# Patient Record
Sex: Female | Born: 1965 | Race: Black or African American | Hispanic: No | Marital: Married | Smoking: Former smoker
Health system: Southern US, Community
[De-identification: ages and names within clinical notes are randomized; demographics above are authoritative.]

## PROBLEM LIST (undated history)

## (undated) DIAGNOSIS — J189 Pneumonia, unspecified organism: Secondary | ICD-10-CM

## (undated) DIAGNOSIS — K5790 Diverticulosis of intestine, part unspecified, without perforation or abscess without bleeding: Secondary | ICD-10-CM

## (undated) DIAGNOSIS — F411 Generalized anxiety disorder: Secondary | ICD-10-CM

## (undated) DIAGNOSIS — K219 Gastro-esophageal reflux disease without esophagitis: Secondary | ICD-10-CM

## (undated) DIAGNOSIS — IMO0002 Reserved for concepts with insufficient information to code with codable children: Secondary | ICD-10-CM

## (undated) DIAGNOSIS — N920 Excessive and frequent menstruation with regular cycle: Secondary | ICD-10-CM

## (undated) DIAGNOSIS — I499 Cardiac arrhythmia, unspecified: Secondary | ICD-10-CM

## (undated) DIAGNOSIS — G620 Drug-induced polyneuropathy: Secondary | ICD-10-CM

## (undated) DIAGNOSIS — J40 Bronchitis, not specified as acute or chronic: Secondary | ICD-10-CM

## (undated) DIAGNOSIS — N926 Irregular menstruation, unspecified: Secondary | ICD-10-CM

## (undated) DIAGNOSIS — M329 Systemic lupus erythematosus, unspecified: Secondary | ICD-10-CM

## (undated) DIAGNOSIS — E119 Type 2 diabetes mellitus without complications: Secondary | ICD-10-CM

## (undated) DIAGNOSIS — K449 Diaphragmatic hernia without obstruction or gangrene: Secondary | ICD-10-CM

## (undated) DIAGNOSIS — Z87898 Personal history of other specified conditions: Secondary | ICD-10-CM

## (undated) DIAGNOSIS — Z972 Presence of dental prosthetic device (complete) (partial): Secondary | ICD-10-CM

## (undated) DIAGNOSIS — Z973 Presence of spectacles and contact lenses: Secondary | ICD-10-CM

## (undated) DIAGNOSIS — J449 Chronic obstructive pulmonary disease, unspecified: Secondary | ICD-10-CM

## (undated) DIAGNOSIS — J329 Chronic sinusitis, unspecified: Secondary | ICD-10-CM

## (undated) DIAGNOSIS — R87610 Atypical squamous cells of undetermined significance on cytologic smear of cervix (ASC-US): Secondary | ICD-10-CM

## (undated) DIAGNOSIS — F419 Anxiety disorder, unspecified: Secondary | ICD-10-CM

## (undated) DIAGNOSIS — N189 Chronic kidney disease, unspecified: Secondary | ICD-10-CM

## (undated) DIAGNOSIS — G709 Myoneural disorder, unspecified: Secondary | ICD-10-CM

## (undated) DIAGNOSIS — K08109 Complete loss of teeth, unspecified cause, unspecified class: Secondary | ICD-10-CM

## (undated) DIAGNOSIS — T7840XA Allergy, unspecified, initial encounter: Secondary | ICD-10-CM

## (undated) DIAGNOSIS — C801 Malignant (primary) neoplasm, unspecified: Secondary | ICD-10-CM

## (undated) DIAGNOSIS — E099 Drug or chemical induced diabetes mellitus without complications: Secondary | ICD-10-CM

## (undated) DIAGNOSIS — G629 Polyneuropathy, unspecified: Secondary | ICD-10-CM

## (undated) DIAGNOSIS — M1990 Unspecified osteoarthritis, unspecified site: Secondary | ICD-10-CM

## (undated) DIAGNOSIS — J4540 Moderate persistent asthma, uncomplicated: Secondary | ICD-10-CM

## (undated) DIAGNOSIS — T451X5A Adverse effect of antineoplastic and immunosuppressive drugs, initial encounter: Secondary | ICD-10-CM

## (undated) DIAGNOSIS — D259 Leiomyoma of uterus, unspecified: Secondary | ICD-10-CM

## (undated) DIAGNOSIS — A599 Trichomoniasis, unspecified: Secondary | ICD-10-CM

## (undated) DIAGNOSIS — I10 Essential (primary) hypertension: Secondary | ICD-10-CM

## (undated) DIAGNOSIS — D509 Iron deficiency anemia, unspecified: Secondary | ICD-10-CM

## (undated) DIAGNOSIS — K5730 Diverticulosis of large intestine without perforation or abscess without bleeding: Secondary | ICD-10-CM

## (undated) DIAGNOSIS — L4050 Arthropathic psoriasis, unspecified: Secondary | ICD-10-CM

## (undated) DIAGNOSIS — T380X5A Adverse effect of glucocorticoids and synthetic analogues, initial encounter: Secondary | ICD-10-CM

## (undated) DIAGNOSIS — M51369 Other intervertebral disc degeneration, lumbar region without mention of lumbar back pain or lower extremity pain: Secondary | ICD-10-CM

## (undated) DIAGNOSIS — F32A Depression, unspecified: Secondary | ICD-10-CM

## (undated) DIAGNOSIS — R51 Headache: Secondary | ICD-10-CM

## (undated) DIAGNOSIS — G43909 Migraine, unspecified, not intractable, without status migrainosus: Secondary | ICD-10-CM

## (undated) DIAGNOSIS — D649 Anemia, unspecified: Secondary | ICD-10-CM

## (undated) DIAGNOSIS — Z5189 Encounter for other specified aftercare: Secondary | ICD-10-CM

## (undated) DIAGNOSIS — L409 Psoriasis, unspecified: Secondary | ICD-10-CM

## (undated) DIAGNOSIS — M5136 Other intervertebral disc degeneration, lumbar region: Secondary | ICD-10-CM

## (undated) DIAGNOSIS — E039 Hypothyroidism, unspecified: Secondary | ICD-10-CM

## (undated) HISTORY — DX: Allergy, unspecified, initial encounter: T78.40XA

## (undated) HISTORY — DX: Trichomoniasis, unspecified: A59.9

## (undated) HISTORY — PX: ANKLE ARTHROSCOPY: SUR85

## (undated) HISTORY — DX: Other intervertebral disc degeneration, lumbar region without mention of lumbar back pain or lower extremity pain: M51.369

## (undated) HISTORY — PX: OTHER SURGICAL HISTORY: SHX169

## (undated) HISTORY — DX: Encounter for other specified aftercare: Z51.89

## (undated) HISTORY — DX: Atypical squamous cells of undetermined significance on cytologic smear of cervix (ASC-US): R87.610

## (undated) HISTORY — DX: Migraine, unspecified, not intractable, without status migrainosus: G43.909

## (undated) HISTORY — DX: Polyneuropathy, unspecified: G62.9

## (undated) HISTORY — DX: Leiomyoma of uterus, unspecified: D25.9

## (undated) HISTORY — DX: Irregular menstruation, unspecified: N92.6

## (undated) HISTORY — DX: Other intervertebral disc degeneration, lumbar region: M51.36

## (undated) HISTORY — PX: DILATION AND CURETTAGE OF UTERUS: SHX78

## (undated) HISTORY — DX: Chronic sinusitis, unspecified: J32.9

## (undated) HISTORY — DX: Myoneural disorder, unspecified: G70.9

## (undated) HISTORY — PX: ESOPHAGEAL DILATION: SHX303

## (undated) HISTORY — DX: Chronic obstructive pulmonary disease, unspecified: J44.9

## (undated) HISTORY — DX: Diverticulosis of intestine, part unspecified, without perforation or abscess without bleeding: K57.90

## (undated) HISTORY — DX: Presence of dental prosthetic device (complete) (partial): Z97.2

## (undated) HISTORY — DX: Diaphragmatic hernia without obstruction or gangrene: K44.9

## (undated) HISTORY — DX: Essential (primary) hypertension: I10

## (undated) HISTORY — DX: Excessive and frequent menstruation with regular cycle: N92.0

## (undated) HISTORY — DX: Bronchitis, not specified as acute or chronic: J40

## (undated) HISTORY — DX: Psoriasis, unspecified: L40.9

## (undated) HISTORY — PX: LYMPH NODE BIOPSY: SHX201

---

## 1990-06-21 DIAGNOSIS — Z5189 Encounter for other specified aftercare: Secondary | ICD-10-CM

## 1990-06-21 HISTORY — DX: Encounter for other specified aftercare: Z51.89

## 1998-02-23 ENCOUNTER — Emergency Department (HOSPITAL_COMMUNITY): Admission: EM | Admit: 1998-02-23 | Discharge: 1998-02-23 | Payer: Self-pay | Admitting: Emergency Medicine

## 1998-04-29 ENCOUNTER — Other Ambulatory Visit: Admission: RE | Admit: 1998-04-29 | Discharge: 1998-04-29 | Payer: Self-pay | Admitting: Obstetrics

## 1998-05-26 ENCOUNTER — Ambulatory Visit (HOSPITAL_COMMUNITY): Admission: RE | Admit: 1998-05-26 | Discharge: 1998-05-26 | Payer: Self-pay | Admitting: Chiropractic Medicine

## 1998-05-26 ENCOUNTER — Encounter: Payer: Self-pay | Admitting: Chiropractic Medicine

## 1998-06-21 HISTORY — PX: ORIF ANKLE FRACTURE: SUR919

## 1998-09-07 ENCOUNTER — Encounter: Payer: Self-pay | Admitting: Emergency Medicine

## 1998-09-07 ENCOUNTER — Inpatient Hospital Stay (HOSPITAL_COMMUNITY): Admission: EM | Admit: 1998-09-07 | Discharge: 1998-09-10 | Payer: Self-pay | Admitting: Emergency Medicine

## 1998-09-08 ENCOUNTER — Encounter: Payer: Self-pay | Admitting: Orthopedic Surgery

## 1999-07-28 ENCOUNTER — Encounter: Admission: RE | Admit: 1999-07-28 | Discharge: 1999-09-07 | Payer: Self-pay | Admitting: Orthopedic Surgery

## 2000-03-01 ENCOUNTER — Emergency Department (HOSPITAL_COMMUNITY): Admission: EM | Admit: 2000-03-01 | Discharge: 2000-03-01 | Payer: Self-pay | Admitting: Emergency Medicine

## 2000-06-19 ENCOUNTER — Emergency Department (HOSPITAL_COMMUNITY): Admission: EM | Admit: 2000-06-19 | Discharge: 2000-06-19 | Payer: Self-pay | Admitting: Emergency Medicine

## 2000-06-27 ENCOUNTER — Emergency Department (HOSPITAL_COMMUNITY): Admission: EM | Admit: 2000-06-27 | Discharge: 2000-06-27 | Payer: Self-pay | Admitting: Emergency Medicine

## 2000-07-07 ENCOUNTER — Emergency Department (HOSPITAL_COMMUNITY): Admission: EM | Admit: 2000-07-07 | Discharge: 2000-07-07 | Payer: Self-pay | Admitting: Emergency Medicine

## 2001-01-31 ENCOUNTER — Encounter: Admission: RE | Admit: 2001-01-31 | Discharge: 2001-01-31 | Payer: Self-pay | Admitting: Obstetrics & Gynecology

## 2001-06-06 ENCOUNTER — Encounter: Payer: Self-pay | Admitting: Emergency Medicine

## 2001-06-06 ENCOUNTER — Emergency Department (HOSPITAL_COMMUNITY): Admission: EM | Admit: 2001-06-06 | Discharge: 2001-06-06 | Payer: Self-pay | Admitting: Emergency Medicine

## 2002-07-24 ENCOUNTER — Encounter: Payer: Self-pay | Admitting: Obstetrics

## 2002-07-24 ENCOUNTER — Ambulatory Visit (HOSPITAL_COMMUNITY): Admission: RE | Admit: 2002-07-24 | Discharge: 2002-07-24 | Payer: Self-pay | Admitting: Obstetrics

## 2002-08-08 ENCOUNTER — Encounter: Admission: RE | Admit: 2002-08-08 | Discharge: 2002-08-08 | Payer: Self-pay | Admitting: Obstetrics

## 2002-08-08 ENCOUNTER — Encounter: Payer: Self-pay | Admitting: Obstetrics

## 2004-04-22 ENCOUNTER — Encounter (INDEPENDENT_AMBULATORY_CARE_PROVIDER_SITE_OTHER): Payer: Self-pay | Admitting: *Deleted

## 2004-04-22 HISTORY — PX: DILATION AND EVACUATION: SHX1459

## 2004-04-23 ENCOUNTER — Inpatient Hospital Stay (HOSPITAL_COMMUNITY): Admission: AD | Admit: 2004-04-23 | Discharge: 2004-04-25 | Payer: Self-pay | Admitting: Obstetrics & Gynecology

## 2004-06-21 DIAGNOSIS — Z923 Personal history of irradiation: Secondary | ICD-10-CM

## 2004-06-21 DIAGNOSIS — Z9221 Personal history of antineoplastic chemotherapy: Secondary | ICD-10-CM

## 2004-06-21 HISTORY — DX: Personal history of antineoplastic chemotherapy: Z92.21

## 2004-06-21 HISTORY — DX: Personal history of irradiation: Z92.3

## 2004-09-07 ENCOUNTER — Other Ambulatory Visit: Admission: RE | Admit: 2004-09-07 | Discharge: 2004-09-07 | Payer: Self-pay | Admitting: Otolaryngology

## 2004-09-19 DIAGNOSIS — Z85819 Personal history of malignant neoplasm of unspecified site of lip, oral cavity, and pharynx: Secondary | ICD-10-CM

## 2004-09-19 HISTORY — DX: Personal history of malignant neoplasm of unspecified site of lip, oral cavity, and pharynx: Z85.819

## 2004-09-30 ENCOUNTER — Encounter (INDEPENDENT_AMBULATORY_CARE_PROVIDER_SITE_OTHER): Payer: Self-pay | Admitting: Specialist

## 2004-09-30 ENCOUNTER — Ambulatory Visit (HOSPITAL_COMMUNITY): Admission: RE | Admit: 2004-09-30 | Discharge: 2004-10-01 | Payer: Self-pay | Admitting: Otolaryngology

## 2004-09-30 HISTORY — PX: DEEP NECK LYMPH NODE BIOPSY / EXCISION: SUR126

## 2004-10-08 ENCOUNTER — Ambulatory Visit: Payer: Self-pay | Admitting: Internal Medicine

## 2004-10-09 ENCOUNTER — Ambulatory Visit: Admission: RE | Admit: 2004-10-09 | Discharge: 2005-01-07 | Payer: Self-pay | Admitting: Radiation Oncology

## 2004-10-13 ENCOUNTER — Ambulatory Visit (HOSPITAL_COMMUNITY): Admission: RE | Admit: 2004-10-13 | Discharge: 2004-10-13 | Payer: Self-pay | Admitting: Radiation Oncology

## 2004-10-14 ENCOUNTER — Ambulatory Visit (HOSPITAL_COMMUNITY): Admission: RE | Admit: 2004-10-14 | Discharge: 2004-10-14 | Payer: Self-pay | Admitting: Otolaryngology

## 2004-10-16 ENCOUNTER — Ambulatory Visit: Payer: Self-pay | Admitting: Dentistry

## 2004-10-16 ENCOUNTER — Encounter (HOSPITAL_COMMUNITY): Admission: EM | Admit: 2004-10-16 | Discharge: 2004-10-16 | Payer: Self-pay | Admitting: Radiation Oncology

## 2004-10-21 ENCOUNTER — Ambulatory Visit (HOSPITAL_COMMUNITY): Admission: RE | Admit: 2004-10-21 | Discharge: 2004-10-21 | Payer: Self-pay | Admitting: Dentistry

## 2004-10-21 ENCOUNTER — Ambulatory Visit: Payer: Self-pay | Admitting: Dentistry

## 2004-10-21 HISTORY — PX: DENTAL RESTORATION/EXTRACTION WITH X-RAY: SHX5796

## 2004-10-27 ENCOUNTER — Ambulatory Visit: Payer: Self-pay | Admitting: Dentistry

## 2004-11-23 ENCOUNTER — Ambulatory Visit: Payer: Self-pay | Admitting: Internal Medicine

## 2004-11-23 ENCOUNTER — Ambulatory Visit: Payer: Self-pay | Admitting: Dentistry

## 2004-12-10 ENCOUNTER — Ambulatory Visit (HOSPITAL_COMMUNITY): Admission: RE | Admit: 2004-12-10 | Discharge: 2004-12-10 | Payer: Self-pay | Admitting: Internal Medicine

## 2005-01-08 ENCOUNTER — Ambulatory Visit: Payer: Self-pay | Admitting: Internal Medicine

## 2005-02-02 ENCOUNTER — Ambulatory Visit: Payer: Self-pay | Admitting: Dentistry

## 2005-03-02 ENCOUNTER — Ambulatory Visit: Payer: Self-pay | Admitting: Dentistry

## 2005-03-02 ENCOUNTER — Ambulatory Visit (HOSPITAL_COMMUNITY): Admission: RE | Admit: 2005-03-02 | Discharge: 2005-03-02 | Payer: Self-pay | Admitting: Internal Medicine

## 2005-03-03 ENCOUNTER — Ambulatory Visit: Payer: Self-pay | Admitting: Internal Medicine

## 2005-03-04 ENCOUNTER — Ambulatory Visit: Admission: RE | Admit: 2005-03-04 | Discharge: 2005-03-18 | Payer: Self-pay | Admitting: Radiation Oncology

## 2005-03-16 ENCOUNTER — Ambulatory Visit (HOSPITAL_COMMUNITY): Admission: RE | Admit: 2005-03-16 | Discharge: 2005-03-16 | Payer: Self-pay | Admitting: Radiation Oncology

## 2005-03-24 ENCOUNTER — Ambulatory Visit: Payer: Self-pay | Admitting: Dentistry

## 2005-03-30 ENCOUNTER — Encounter: Payer: Self-pay | Admitting: Radiation Oncology

## 2005-03-31 ENCOUNTER — Encounter: Admission: RE | Admit: 2005-03-31 | Discharge: 2005-03-31 | Payer: Self-pay | Admitting: Internal Medicine

## 2005-04-30 ENCOUNTER — Ambulatory Visit: Payer: Self-pay | Admitting: Dentistry

## 2005-05-31 ENCOUNTER — Ambulatory Visit: Payer: Self-pay | Admitting: Internal Medicine

## 2005-06-15 ENCOUNTER — Ambulatory Visit (HOSPITAL_COMMUNITY): Admission: RE | Admit: 2005-06-15 | Discharge: 2005-06-15 | Payer: Self-pay | Admitting: Internal Medicine

## 2005-08-31 ENCOUNTER — Ambulatory Visit: Payer: Self-pay | Admitting: Internal Medicine

## 2005-08-31 ENCOUNTER — Ambulatory Visit (HOSPITAL_COMMUNITY): Admission: RE | Admit: 2005-08-31 | Discharge: 2005-08-31 | Payer: Self-pay | Admitting: Internal Medicine

## 2005-09-09 ENCOUNTER — Ambulatory Visit (HOSPITAL_COMMUNITY): Admission: RE | Admit: 2005-09-09 | Discharge: 2005-09-09 | Payer: Self-pay

## 2005-10-20 ENCOUNTER — Encounter: Payer: Self-pay | Admitting: Emergency Medicine

## 2005-10-27 ENCOUNTER — Ambulatory Visit: Admission: RE | Admit: 2005-10-27 | Discharge: 2005-10-29 | Payer: Self-pay | Admitting: Radiation Oncology

## 2005-12-17 ENCOUNTER — Ambulatory Visit: Payer: Self-pay | Admitting: Dentistry

## 2005-12-23 ENCOUNTER — Ambulatory Visit: Payer: Self-pay | Admitting: Internal Medicine

## 2005-12-30 ENCOUNTER — Ambulatory Visit (HOSPITAL_COMMUNITY): Admission: RE | Admit: 2005-12-30 | Discharge: 2005-12-30 | Payer: Self-pay | Admitting: Internal Medicine

## 2005-12-30 LAB — COMPREHENSIVE METABOLIC PANEL
ALT: 8 U/L (ref 0–40)
AST: 13 U/L (ref 0–37)
Albumin: 3.6 g/dL (ref 3.5–5.2)
Alkaline Phosphatase: 32 U/L — ABNORMAL LOW (ref 39–117)
Glucose, Bld: 88 mg/dL (ref 70–99)
Potassium: 4.1 mEq/L (ref 3.5–5.3)
Sodium: 137 mEq/L (ref 135–145)
Total Bilirubin: 0.3 mg/dL (ref 0.3–1.2)
Total Protein: 6.5 g/dL (ref 6.0–8.3)

## 2005-12-30 LAB — CBC WITH DIFFERENTIAL/PLATELET
BASO%: 0.3 % (ref 0.0–2.0)
Eosinophils Absolute: 0 10*3/uL (ref 0.0–0.5)
LYMPH%: 17.5 % (ref 14.0–48.0)
MCHC: 34.5 g/dL (ref 32.0–36.0)
MCV: 94.9 fL (ref 81.0–101.0)
MONO#: 0.6 10*3/uL (ref 0.1–0.9)
MONO%: 10.1 % (ref 0.0–13.0)
NEUT#: 4.4 10*3/uL (ref 1.5–6.5)
Platelets: 324 10*3/uL (ref 145–400)
RBC: 3.76 10*6/uL (ref 3.70–5.32)
RDW: 13.5 % (ref 11.3–14.5)
WBC: 6.1 10*3/uL (ref 3.9–10.0)

## 2006-04-01 ENCOUNTER — Encounter: Admission: RE | Admit: 2006-04-01 | Discharge: 2006-04-01 | Payer: Self-pay | Admitting: Radiation Oncology

## 2006-05-16 ENCOUNTER — Ambulatory Visit: Payer: Self-pay | Admitting: Internal Medicine

## 2006-05-18 ENCOUNTER — Ambulatory Visit (HOSPITAL_COMMUNITY): Admission: RE | Admit: 2006-05-18 | Discharge: 2006-05-18 | Payer: Self-pay | Admitting: Internal Medicine

## 2006-05-18 LAB — COMPREHENSIVE METABOLIC PANEL
ALT: 14 U/L (ref 0–35)
Albumin: 3.1 g/dL — ABNORMAL LOW (ref 3.5–5.2)
CO2: 31 mEq/L (ref 19–32)
Glucose, Bld: 94 mg/dL (ref 70–99)
Potassium: 3.5 mEq/L (ref 3.5–5.3)
Sodium: 140 mEq/L (ref 135–145)
Total Protein: 6 g/dL (ref 6.0–8.3)

## 2006-05-18 LAB — CBC WITH DIFFERENTIAL/PLATELET
BASO%: 0.3 % (ref 0.0–2.0)
Eosinophils Absolute: 0 10*3/uL (ref 0.0–0.5)
MCHC: 34.4 g/dL (ref 32.0–36.0)
MONO#: 0.5 10*3/uL (ref 0.1–0.9)
NEUT#: 4.4 10*3/uL (ref 1.5–6.5)
RBC: 3.52 10*6/uL — ABNORMAL LOW (ref 3.70–5.32)
RDW: 12.7 % (ref 11.3–14.5)
WBC: 6.1 10*3/uL (ref 3.9–10.0)
lymph#: 1.2 10*3/uL (ref 0.9–3.3)

## 2006-09-12 ENCOUNTER — Ambulatory Visit: Payer: Self-pay | Admitting: Internal Medicine

## 2006-09-14 ENCOUNTER — Ambulatory Visit (HOSPITAL_COMMUNITY): Admission: RE | Admit: 2006-09-14 | Discharge: 2006-09-14 | Payer: Self-pay | Admitting: Internal Medicine

## 2006-09-14 LAB — CBC WITH DIFFERENTIAL/PLATELET
BASO%: 0.5 % (ref 0.0–2.0)
EOS%: 0.6 % (ref 0.0–7.0)
Eosinophils Absolute: 0 10*3/uL (ref 0.0–0.5)
LYMPH%: 19.5 % (ref 14.0–48.0)
MCH: 32.5 pg (ref 26.0–34.0)
MCHC: 34.5 g/dL (ref 32.0–36.0)
MCV: 94.3 fL (ref 81.0–101.0)
MONO%: 9.1 % (ref 0.0–13.0)
Platelets: 313 10*3/uL (ref 145–400)
RBC: 3.68 10*6/uL — ABNORMAL LOW (ref 3.70–5.32)
RDW: 13.5 % (ref 11.3–14.5)

## 2006-09-14 LAB — COMPREHENSIVE METABOLIC PANEL
AST: 18 U/L (ref 0–37)
Alkaline Phosphatase: 22 U/L — ABNORMAL LOW (ref 39–117)
Glucose, Bld: 106 mg/dL — ABNORMAL HIGH (ref 70–99)
Potassium: 4 mEq/L (ref 3.5–5.3)
Sodium: 139 mEq/L (ref 135–145)
Total Bilirubin: 0.3 mg/dL (ref 0.3–1.2)
Total Protein: 6.8 g/dL (ref 6.0–8.3)

## 2006-10-14 ENCOUNTER — Ambulatory Visit: Payer: Self-pay | Admitting: Dentistry

## 2006-10-24 ENCOUNTER — Ambulatory Visit (HOSPITAL_COMMUNITY): Admission: RE | Admit: 2006-10-24 | Discharge: 2006-10-24 | Payer: Self-pay | Admitting: Obstetrics & Gynecology

## 2006-12-27 ENCOUNTER — Ambulatory Visit: Payer: Self-pay | Admitting: Dentistry

## 2007-03-01 ENCOUNTER — Ambulatory Visit: Payer: Self-pay | Admitting: Dentistry

## 2007-03-13 ENCOUNTER — Ambulatory Visit: Payer: Self-pay | Admitting: Internal Medicine

## 2007-03-15 ENCOUNTER — Ambulatory Visit (HOSPITAL_COMMUNITY): Admission: RE | Admit: 2007-03-15 | Discharge: 2007-03-15 | Payer: Self-pay | Admitting: Internal Medicine

## 2007-03-15 LAB — CBC WITH DIFFERENTIAL/PLATELET
Basophils Absolute: 0 10*3/uL (ref 0.0–0.1)
EOS%: 1 % (ref 0.0–7.0)
Eosinophils Absolute: 0.1 10*3/uL (ref 0.0–0.5)
LYMPH%: 20.8 % (ref 14.0–48.0)
MCH: 32.4 pg (ref 26.0–34.0)
MCV: 92.2 fL (ref 81.0–101.0)
MONO%: 8.8 % (ref 0.0–13.0)
NEUT#: 5.3 10*3/uL (ref 1.5–6.5)
Platelets: 323 10*3/uL (ref 145–400)
RBC: 3.45 10*6/uL — ABNORMAL LOW (ref 3.70–5.32)

## 2007-03-15 LAB — COMPREHENSIVE METABOLIC PANEL
Alkaline Phosphatase: 26 U/L — ABNORMAL LOW (ref 39–117)
BUN: 10 mg/dL (ref 6–23)
Glucose, Bld: 92 mg/dL (ref 70–99)
Sodium: 139 mEq/L (ref 135–145)
Total Bilirubin: 0.5 mg/dL (ref 0.3–1.2)

## 2007-04-03 ENCOUNTER — Encounter: Admission: RE | Admit: 2007-04-03 | Discharge: 2007-04-03 | Payer: Self-pay

## 2007-07-20 ENCOUNTER — Ambulatory Visit: Payer: Self-pay | Admitting: Dentistry

## 2008-03-12 ENCOUNTER — Ambulatory Visit: Payer: Self-pay | Admitting: Internal Medicine

## 2008-03-14 ENCOUNTER — Ambulatory Visit (HOSPITAL_COMMUNITY): Admission: RE | Admit: 2008-03-14 | Discharge: 2008-03-14 | Payer: Self-pay | Admitting: Internal Medicine

## 2008-03-14 LAB — COMPREHENSIVE METABOLIC PANEL
Albumin: 3.2 g/dL — ABNORMAL LOW (ref 3.5–5.2)
CO2: 30 mEq/L (ref 19–32)
Calcium: 8.6 mg/dL (ref 8.4–10.5)
Chloride: 107 mEq/L (ref 96–112)
Glucose, Bld: 106 mg/dL — ABNORMAL HIGH (ref 70–99)
Potassium: 3.2 mEq/L — ABNORMAL LOW (ref 3.5–5.3)
Sodium: 139 mEq/L (ref 135–145)
Total Bilirubin: 0.5 mg/dL (ref 0.3–1.2)
Total Protein: 6.2 g/dL (ref 6.0–8.3)

## 2008-03-14 LAB — CBC WITH DIFFERENTIAL/PLATELET
Eosinophils Absolute: 0.1 10*3/uL (ref 0.0–0.5)
HCT: 35.4 % (ref 34.8–46.6)
LYMPH%: 24 % (ref 14.0–48.0)
MONO#: 0.5 10*3/uL (ref 0.1–0.9)
NEUT#: 5 10*3/uL (ref 1.5–6.5)
Platelets: 300 10*3/uL (ref 145–400)
RBC: 3.79 10*6/uL (ref 3.70–5.32)
WBC: 7.5 10*3/uL (ref 3.9–10.0)
lymph#: 1.8 10*3/uL (ref 0.9–3.3)

## 2008-04-03 ENCOUNTER — Encounter: Admission: RE | Admit: 2008-04-03 | Discharge: 2008-04-03 | Payer: Self-pay | Admitting: Radiation Oncology

## 2009-01-21 ENCOUNTER — Emergency Department (HOSPITAL_COMMUNITY): Admission: EM | Admit: 2009-01-21 | Discharge: 2009-01-21 | Payer: Self-pay | Admitting: Emergency Medicine

## 2009-01-23 ENCOUNTER — Encounter: Admission: RE | Admit: 2009-01-23 | Discharge: 2009-01-23 | Payer: Self-pay

## 2009-02-10 ENCOUNTER — Ambulatory Visit (HOSPITAL_COMMUNITY): Admission: RE | Admit: 2009-02-10 | Discharge: 2009-02-10 | Payer: Self-pay

## 2009-03-18 ENCOUNTER — Ambulatory Visit: Payer: Self-pay | Admitting: Internal Medicine

## 2009-03-25 ENCOUNTER — Ambulatory Visit (HOSPITAL_COMMUNITY): Admission: RE | Admit: 2009-03-25 | Discharge: 2009-03-25 | Payer: Self-pay | Admitting: Internal Medicine

## 2009-03-25 LAB — CBC WITH DIFFERENTIAL/PLATELET
BASO%: 0.3 % (ref 0.0–2.0)
Basophils Absolute: 0 10*3/uL (ref 0.0–0.1)
EOS%: 0.1 % (ref 0.0–7.0)
Eosinophils Absolute: 0 10*3/uL (ref 0.0–0.5)
HCT: 30.9 % — ABNORMAL LOW (ref 34.8–46.6)
HGB: 10.6 g/dL — ABNORMAL LOW (ref 11.6–15.9)
LYMPH%: 15 % (ref 14.0–49.7)
MCH: 31.9 pg (ref 25.1–34.0)
MCHC: 34.2 g/dL (ref 31.5–36.0)
MCV: 93.1 fL (ref 79.5–101.0)
MONO#: 0.5 10*3/uL (ref 0.1–0.9)
MONO%: 4.5 % (ref 0.0–14.0)
NEUT#: 8.8 10*3/uL — ABNORMAL HIGH (ref 1.5–6.5)
NEUT%: 80.1 % — ABNORMAL HIGH (ref 38.4–76.8)
Platelets: 355 10*3/uL (ref 145–400)
RBC: 3.32 10*6/uL — ABNORMAL LOW (ref 3.70–5.45)
RDW: 15.8 % — ABNORMAL HIGH (ref 11.2–14.5)
WBC: 11 10*3/uL — ABNORMAL HIGH (ref 3.9–10.3)
lymph#: 1.6 10*3/uL (ref 0.9–3.3)

## 2009-03-25 LAB — COMPREHENSIVE METABOLIC PANEL
ALT: 24 U/L (ref 0–35)
AST: 20 U/L (ref 0–37)
Albumin: 3.1 g/dL — ABNORMAL LOW (ref 3.5–5.2)
Alkaline Phosphatase: 43 U/L (ref 39–117)
BUN: 22 mg/dL (ref 6–23)
CO2: 28 mEq/L (ref 19–32)
Calcium: 9.1 mg/dL (ref 8.4–10.5)
Chloride: 97 mEq/L (ref 96–112)
Creatinine, Ser: 1.16 mg/dL (ref 0.40–1.20)
Glucose, Bld: 189 mg/dL — ABNORMAL HIGH (ref 70–99)
Potassium: 3 mEq/L — ABNORMAL LOW (ref 3.5–5.3)
Sodium: 135 mEq/L (ref 135–145)
Total Bilirubin: 0.5 mg/dL (ref 0.3–1.2)
Total Protein: 6.8 g/dL (ref 6.0–8.3)

## 2009-04-29 ENCOUNTER — Ambulatory Visit: Payer: Self-pay | Admitting: Internal Medicine

## 2009-05-18 ENCOUNTER — Emergency Department (HOSPITAL_COMMUNITY): Admission: EM | Admit: 2009-05-18 | Discharge: 2009-05-18 | Payer: Self-pay | Admitting: Emergency Medicine

## 2009-06-06 ENCOUNTER — Ambulatory Visit: Payer: Self-pay | Admitting: Internal Medicine

## 2009-07-03 ENCOUNTER — Ambulatory Visit (HOSPITAL_COMMUNITY)
Admission: RE | Admit: 2009-07-03 | Discharge: 2009-07-03 | Payer: Self-pay | Source: Home / Self Care | Admitting: Internal Medicine

## 2009-08-14 ENCOUNTER — Encounter: Admission: RE | Admit: 2009-08-14 | Discharge: 2009-08-14 | Payer: Self-pay | Admitting: Internal Medicine

## 2010-07-11 ENCOUNTER — Other Ambulatory Visit: Payer: Self-pay | Admitting: Internal Medicine

## 2010-07-11 ENCOUNTER — Encounter: Payer: Self-pay | Admitting: Radiation Oncology

## 2010-07-11 DIAGNOSIS — Z1239 Encounter for other screening for malignant neoplasm of breast: Secondary | ICD-10-CM

## 2010-07-12 ENCOUNTER — Encounter: Payer: Self-pay | Admitting: Internal Medicine

## 2010-07-13 ENCOUNTER — Encounter: Payer: Self-pay | Admitting: Internal Medicine

## 2010-08-17 ENCOUNTER — Ambulatory Visit: Payer: Self-pay

## 2010-08-17 ENCOUNTER — Other Ambulatory Visit: Payer: Self-pay | Admitting: Oncology

## 2010-09-04 ENCOUNTER — Ambulatory Visit: Payer: Self-pay

## 2010-09-10 ENCOUNTER — Ambulatory Visit: Payer: Self-pay

## 2010-11-06 NOTE — Op Note (Signed)
NAMEJERRIKA, [name]              ACCOUNT NO.:  [id number]   MEDICAL RECORD NO.:  [id number]          PATIENT TYPE:  OBV   LOCATION:  9399                          FACILITY:  [facility]   PHYSICIAN:  Roseanna Rainbow, M.D.DATE OF BIRTH:  01-20-1966   DATE OF PROCEDURE:  04/22/2004  DATE OF DISCHARGE:                                 OPERATIVE REPORT   PREOPERATIVE DIAGNOSES:  Intrauterine fetal demise at 18 weeks.   POSTOPERATIVE DIAGNOSES:  Intrauterine fetal demise at 18 weeks.   PROCEDURE:  Suction dilatation and evacuation.   SURGEON:  Roseanna Rainbow, M.D.   ANESTHESIA:  Managed anesthesia care, paracervical block.   ESTIMATED BLOOD LOSS:  Less than 100 mL.   URINE OUTPUT:  100 mL clear urine at the beginning of the procedure.   COMPLICATIONS:  None.   DESCRIPTION OF PROCEDURE:  The patient was taken to the operating room.  She  was placed in the dorsal lithotomy position and prepped and draped in the  usual sterile fashion.  A sterile speculum was placed in the patient's  vagina and the cervix was noted to be closed.  The anterior lip of the  cervix was then infiltrated with 2 mL of 1% lidocaine. The single tooth  tenaculum was then applied to this location. 10 mL of 1% lidocaine were then  injected at 5 and 7 o'clock to produce a paracervical block. The cervix was  then dilated with [facility] dilators.  A 16 mm suction curette was then advanced  into the intrauterine cavity with ultrasound guidance.  The amniotic sac was  ruptured.  The fetus was suctioned down to the external os. The fetal parts  were then retrieved from the os with __________ crushing forceps. The  suction curette was then reintroduced into the uterine cavity again using  ultrasound guidance. Several passes were made. The placental tissue was  retrieved again with __________ crushing forceps from the cervix. A sharp  curettage was then performed. A gritty texture was noted. The suction  curette was  then reintroduced into the uterine cavity to evacuate the uterus  of any remaining products of conception.  The single tooth tenaculum was  then removed from the cervix with minimal bleeding noted.  At the close of  the procedure, the instrument and pack counts were said to be correct x2.  The patient was taken to the PACU awake and in stable condition.      LAJ/MEDQ  D:  04/22/2004  T:  04/22/2004  Job:  [id number]

## 2010-11-06 NOTE — Op Note (Signed)
[name], [name]              ACCOUNT NO.:  [id number]   MEDICAL RECORD NO.:  [id number]          PATIENT TYPE:  AMB   LOCATION:  DAY                          FACILITY:  [facility]   PHYSICIAN:  Charlynne Pander, D.D.S.DATE OF BIRTH:  1965-12-29   DATE OF PROCEDURE:  10/21/2004  DATE OF DISCHARGE:                                 OPERATIVE REPORT   SURGEON:  Charlynne Pander, D.D.S.   OPERATIVE REPORT:   PREOPERATIVE DIAGNOSES:  1.  Nasopharyngeal carcinoma.  2.  Pre-chemoradiation dental protocol.  3.  Chronic periodontitis.  4.  Accretions.   POSTOPERATIVE DIAGNOSES:  1.  Nasopharyngeal carcinoma.  2.  Pre-chemoradiation dental protocol.  3.  Chronic periodontitis.  4.  Accretions.   OPERATIONS:  1.  Dental examination.  2.  Extraction of teeth #1, 16, 17, 31, and 32.  3.  Four quadrants of selective alveoloplasty.  4.  Four quadrants of scaling and root planing.   ASSISTANT:  Elliot Dally (Sales executive).   ANESTHESIA:  General anesthesia via oral endotracheal tube.   MEDICATIONS:  1.  Clindamycin 600 mg IV prior to invasive dental procedures.  2.  Local anesthesia with a total utilization of 5 carpules each containing      36 mg of Xylocaine with 0.018 mg of epinephrine.   SPECIMENS:  There were five teeth, which were discarded.   CULTURES:  None.   DRAINS:  None.   COMPLICATIONS:  None.   ESTIMATED BLOOD LOSS:  Less than 50 mL.   FLUIDS:  1,500 mL of lactated Ringer's solution.   INDICATIONS:  The patient was recently diagnosed with nasopharyngeal  carcinoma.  Patient with anticipated chemoradiation therapies.  A dental  consultation was requested as part of a pre-chemoradiation therapy dental  protocol.  The patient was examined, and treatment plan for extraction of  teeth #1, 16, 17, 31, and 32, with alveoplasty as indicated along with  scaling and root planing of the remaining teeth.  This treatment plan was  formulated to decrease the risk of  complications associated with dental  infection from affecting the patient's systemic health while undergoing  chemoradiation therapy.  This was also performed to prevent future  complication of osteoradionecrosis.   OPERATIVE FINDINGS:  The patient was examined in operating room #4.  The  teeth were identified for extraction.  The patient was noted to be affected  by chronic periodontitis and the presence of significant dental accretions.   DESCRIPTION OF PROCEDURE:  The patient was brought to the main operating  room #4.  The patient was then placed in the supine position on the  operating room table.  General anesthesia was induced per oral endotracheal  tube.  The patient was then prepped and draped in the usual manner for a  dental medicine procedure.  The oral cavity was thoroughly examined, with  the findings as noted above.  A throat pack was placed at this time.  The  patient was then ready for the dental medicine procedure as follows:   Local anesthesia was administered sequentially over the two-hour-long  procedure with a total  utilization of 5 carpules each containing 36 mg of  Xylocaine with 0.018 mg of epinephrine.   The maxillary right and mandibular right quadrants were first approach.  Anesthesia was delivered as previously described.  A Woodson was utilized to  remove the soft tissue around tooth #'s 1, 31 and 32.  A 15 blade incision  was made from the distal of #32 through the mesial of #31.  A surgical flap  was then carefully reflected.  Buccal bone was then removed with a rongeur  as indicated.  Tooth #31 and 32 were subluxated with a series of straight  elevators and then removed with the 23 forceps without complications.  Alveoloplasty was then performed utilizing rongeurs and bone file.  The  surgical site was then irrigated with copious amounts of sterile saline.  The soft tissues were trimmed appropriately.  Surgical site was then closed  from the distal of  #32 through the distal of #30 utilizing 3-0 chromic gut  suture in a continuous interrupted suture technique x 1.  One additional  interproximal suture was placed between tooth #29 and #30.   The maxillary right quadrant was then approached.  Tooth #1 was subluxated  and then removed with a 150 forceps without complications.  Alveoloplasty  was then performed utilizing rongeurs and bone file.  A distal wedge  procedure was then performed with a 15 blade and soft tissue pickups.  The  surgical site was then irrigated with copious amounts of sterile saline.  Surgical site was then closed utilizing 3-0 chromic gut suture in a figure-  of-eight suture technique x 1.   At this point in time, the [facility] Sonic scaler was then utilized to remove  significant dental accretions of the upper right, lower right, upper left  and lower left quadrants.  This was followed by a series of hand curettes to  remove further accretions as indicated.  An extensive amount of time was  utilized with the curettes to perform the scaling and route planing  procedures of the appropriate four quadrants.   At this point in time, the anesthesia team was asked to move the oral  endotracheal tube from the left side of the mouth to the right side of the  mouth, and this was done without complications.   The maxillary and mandibular left quadrants were then approached.  Anesthesia was delivered as previously described.  Tooth #16 was then first  approached with a Woodson elevated, and the soft tissues were removed  appropriately.  Tooth #16 was then subluxated and then removed with a 150  forceps without complications.  Alveoloplasty was then performed utilizing  rongeurs and bone file.  The surgical site was then irrigated with copious  amounts of sterile saline.  The surgical site was then closed utilizing 3-0  chromic gut suture in a figure-of-eight suture technique x 1.  The mandibular left quadrant was then  approached.  Tooth #17 was subluxated  with a series of straight elevators and then removed with a 23 forceps  without complications.  Minor alveoloplasty was then performed utilizing  rongeurs and bone file to assist in obtaining a primary closure.  The  surgical site was then closed from the distal of #17 through the distal of  #18, utilizing 3-0 chromic gut suture in a continuous interrupted suture  technique x 1.  This was again after copious amounts of sterile saline  irrigation.   At this point in time, the [facility] scaler was then  again utilized to  remove accretions around the maxillary and mandibular molars appropriately.  A series of hand curettes were then utilized to further remove accretions.  The [facility] Sonic scaler was then again utilized to remove accretions as  indicated.   At this point in time, the entire mouth was irrigated with copious amounts  of sterile saline.  The patient was examined for complications, and seeing  none, the dental medicine procedure was deemed to be complete.  The throat  pack was removed as indicated, without complications.  The patient was then  handed over to the anesthesia team for final disposition.  After an  appropriate amount of time, the patient was extubated and taken to the  postanesthesia care unit with stable vital signs and a good oxygenation  level.  All counts were correct for the dental medicine procedure.  The  patient will be followed for appropriate suture removal in approximately one  wee.  The patient will be given appropriate pain medication as indicated.      RFK/MEDQ  D:  10/21/2004  T:  10/21/2004  Job:  [id number]   cc:   Lajuana Matte, MD  Fax: [phone]   Artist Pais. Kathrynn Running, M.D.  [address] [facility]  [place]  [place] [address]  Fax: [phone]   Charlynne Pander, D.D.S.  [facility]  [address]  [place]  [place] [address]  Fax: [phone]

## 2010-11-06 NOTE — Discharge Summary (Signed)
NAMEKATRINIA, STRAKER              ACCOUNT NO.:  [id number]   MEDICAL RECORD NO.:  [id number]          PATIENT TYPE:  INP   LOCATION:  9306                          FACILITY:  [facility]   PHYSICIAN:  Roseanna Rainbow, M.D.DATE OF BIRTH:  1966-03-09   DATE OF ADMISSION:  04/22/2004  DATE OF DISCHARGE:  04/25/2004                                 DISCHARGE SUMMARY   CHIEF COMPLAINT:  The patient is a 45 year old gravida 2 para 1 who presents  at 18+ weeks complaining of lower abdominal cramping and vaginal bleeding.   HISTORY OF PRESENT ILLNESS:  See above.   PRENATAL COURSE:  Source of care:  [facility].  Pregnancy complications or  risks:  History of a previous cesarean delivery and this was a preterm  delivery.   MEDICATIONS:  Prenatal vitamins.   ALLERGIES:  PENICILLIN, CEPHALOSPORINS, TETRACYCLINE.   PAST OBSTETRICAL AND GYNECOLOGICAL HISTORY:  See above.   PAST MEDICAL HISTORY:  She denies.   PAST SURGICAL HISTORY:  See above, ankle surgery.   FAMILY AND SOCIAL HISTORY:  She denies any tobacco, ethanol, or substance  abuse.   PHYSICAL EXAMINATION:  VITAL SIGNS:  Temperature 98.8, pulse 100,  respiratory rate 22, blood pressure 107/44.  GENERAL:  Moderate distress.  PELVIC:  Cervical exam difficult secondary to the patient's discomfort.   CURRENT LABORATORY AND/OR ULTRASOUND RESULTS:  Ultrasound at the bedside:  Anhydramnios, fetus partially in the cervix.  Hemoglobin 11.   ASSESSMENT:  Inevitable abortion at 18 weeks.   PLAN:  To the OR for suction dilatation and evacuation.   HOSPITAL COURSE:  The patient was admitted and underwent a suction  dilatation and evacuation.  Please see the dictated operative summary for  further details.  She had a fever in the recovery room.  She was started on  broad-spectrum antibiotics.  She remained afebrile.  A white blood cell  count was 7.6.  She was discharged to home on postoperative day #2.   DISCHARGE DIAGNOSIS:  Rule out  septic abortion.   PROCEDURE:  Suction diltation and evacuation.   CONDITION:  Stable.   DIET:  Regular.   ACTIVITY:  No intercourse for 4 weeks.   MEDICATIONS:  Levaquin and Ambien.   DISPOSITION:  The patient was to follow up in the office in 2 weeks.     Collier Flowers  D:  05/22/2004  T:  05/22/2004  Job:  [id number]

## 2010-11-06 NOTE — Op Note (Signed)
[name], [name]              ACCOUNT NO.:  [id number]   MEDICAL RECORD NO.:  [id number]          PATIENT TYPE:  OIB   LOCATION:  2550                         FACILITY:  [facility]   PHYSICIAN:  Zola Button T. Lazarus Salines, M.D. DATE OF BIRTH:  09-28-1965   DATE OF PROCEDURE:  09/30/2004  DATE OF DISCHARGE:                                 OPERATIVE REPORT   PREOPERATIVE DIAGNOSIS:  Right retropharyngeal and upper neck adenopathy,  rule out lymphoma.   POSTOPERATIVE DIAGNOSIS:  Right retropharyngeal and upper neck adenopathy,  rule out lymphoma.   PROCEDURE PERFORMED:  Excisional biopsy, right upper neck and nodes.  Transmucosal biopsy, right retropharyngeal node.   SURGEON:  Gloris Manchester. Lazarus Salines, M.D.   ANESTHESIA:  General orotracheal.   BLOOD LOSS:  Minimal.   COMPLICATIONS:  None.   FINDINGS:  A roughly 2 x 2  x 1 cm rubbery, mobile, right retropharyngeal  node. Large matted nodes in the right upper neck with the greatest being  approximately 3 cm and roughly spherical.   PROCEDURE:  With the patient in the comfortable supine position, general  orotracheal anesthesia was induced without difficulty.  At an appropriate  level, the oral cavity was inspected. A Crowe-Davis mouth gag was introduced  taking care to protect lips, teeth, and endotracheal tube.  It was expanded  for visualization. The mass in the retropharynx was examined with the  findings as described above. Under direct vision, first ascertaining that it  was not pulsatile, a cup forceps was used to bite into the mucosa. The mass  appeared to be deep to this. The capsule was visualized. A sharp 15 blade  was used to penetrate the capsule and the cup forceps were again placed now  apparently into the node and several additional biopsies were taken. These  were sent separately for pathologic interpretation. Hemostasis was  spontaneous. No closure was attempted.   The patient was placed in reverse Trendelenburg, the head  rotated towards  the left and shoulders rolled for better access to the right neck. The neck  was palpated with the findings as described above. 1% Xylocaine with  1:100,000 epinephrine, 5 cc total was infiltrated along a preexisting skin  wrinkle for intraoperative hemostasis. Several minutes were allowed to this  take effect. A sterile preparation and draping of the neck was accomplished.   Again the neck was palpated with the findings as described above. A 5 cm  incision was then sharply executed along the preexisting skin wrinkle and  carried down through skin, subcutaneous fat, and platysma muscle. Using the  cutting and coagulating cautery, the anterior edge of the  sternocleidomastoid muscle was identified and dissected posteriorly. The  node was identified with moderate fibrosis. Dissection down through the  fibrosis to the capsule of the node was accomplished. Staying directly on  the capsule of the node, superior surface was cleaned.  Working around the  lateral and inferior surfaces bluntly, and under direct vision, dividing  connecting bands either with Metzenbaum scissors or with the Bovie, the node  was carefully dissected out. The jugular vein was identified beneath a  layer  of fibrotic tissue and was not violated. The external jugular vein was in  the posterior aspect of the wound and it was not violated either. Working  superiorly, there was some apparent cross conduction to the spinal accessory  nerve but the nerve was never directly visualized and staying directly on  the capsule of the nodes, the dissection was completed. A large mass of  nodes was removed with the largest being approximately 3 cm spherical and  sent for lymphoma workup. Small amount of oozing was noted. The wound was  irrigated and suctioned clean. A quarter inch Penrose drain was placed in  the depths of the wounds.  The wound was closed with interrupted 4-0 chromic  suture in the platysma layer and a  running subcuticular 5-0 Ethilon in the  skin layer. Benzoin and Steri-Strips were used to complete the skin  approximation. A standard fluff and HypaFix dressing was applied. At this  point the procedure was completed. The patient was returned to Anesthesia,  awakened, extubated, and transferred to recovery in stable condition.   COMMENT:  45 year old black female with a several-month history of  progressively enlarging neck nodes with a needle aspiration suggesting a  polymorphous population but with the clinical picture still suggesting  lymphoma was indication for today's procedure. There was possibility that  the patient has lupus although this degree of adenopathy in a focal site  would be unusual. Anticipate routine postoperative recovery with attention  to ice, elevation, analgesia. Will remove the dressing and drain in 24 hours  and discharge her to her home.      KTW/MEDQ  D:  09/30/2004  T:  09/30/2004  Job:  [id number]   cc:   Areatha Keas, M.D.  [address]  [address]  [place]  [place] [address]  Fax: [phone]   Fleet Contras, M.D.  [address]  [place]  [place] [address]  Fax: [phone]

## 2010-11-25 ENCOUNTER — Ambulatory Visit
Admission: RE | Admit: 2010-11-25 | Discharge: 2010-11-25 | Disposition: A | Discharge status: Home / Self Care | Payer: Medicare Other | Source: Ambulatory Visit | Attending: Internal Medicine | Admitting: Internal Medicine

## 2010-11-25 DIAGNOSIS — Z1239 Encounter for other screening for malignant neoplasm of breast: Secondary | ICD-10-CM

## 2011-01-07 ENCOUNTER — Emergency Department (HOSPITAL_COMMUNITY)
Admission: EM | Admit: 2011-01-07 | Discharge: 2011-01-07 | Disposition: A | Discharge status: Home / Self Care | Payer: Medicare Other | Attending: Emergency Medicine | Admitting: Emergency Medicine

## 2011-01-07 DIAGNOSIS — Z85819 Personal history of malignant neoplasm of unspecified site of lip, oral cavity, and pharynx: Secondary | ICD-10-CM | POA: Insufficient documentation

## 2011-01-07 DIAGNOSIS — R51 Headache: Secondary | ICD-10-CM | POA: Insufficient documentation

## 2011-03-08 ENCOUNTER — Other Ambulatory Visit: Payer: Self-pay | Admitting: Internal Medicine

## 2011-03-08 DIAGNOSIS — M542 Cervicalgia: Secondary | ICD-10-CM

## 2011-03-13 ENCOUNTER — Ambulatory Visit
Admission: RE | Admit: 2011-03-13 | Discharge: 2011-03-13 | Disposition: A | Discharge status: Home / Self Care | Payer: Medicare Other | Source: Ambulatory Visit | Attending: Internal Medicine | Admitting: Internal Medicine

## 2011-03-13 DIAGNOSIS — M542 Cervicalgia: Secondary | ICD-10-CM

## 2011-03-15 ENCOUNTER — Other Ambulatory Visit: Payer: Self-pay | Admitting: Internal Medicine

## 2011-03-15 DIAGNOSIS — M542 Cervicalgia: Secondary | ICD-10-CM

## 2011-05-24 ENCOUNTER — Other Ambulatory Visit: Payer: Self-pay | Admitting: Internal Medicine

## 2011-05-24 ENCOUNTER — Ambulatory Visit
Admission: RE | Admit: 2011-05-24 | Discharge: 2011-05-24 | Disposition: A | Discharge status: Home / Self Care | Payer: Medicare Other | Source: Ambulatory Visit | Attending: Internal Medicine | Admitting: Internal Medicine

## 2011-05-24 DIAGNOSIS — M25572 Pain in left ankle and joints of left foot: Secondary | ICD-10-CM

## 2011-06-22 DIAGNOSIS — Z87442 Personal history of urinary calculi: Secondary | ICD-10-CM | POA: Insufficient documentation

## 2011-06-22 HISTORY — DX: Personal history of urinary calculi: Z87.442

## 2011-10-15 ENCOUNTER — Ambulatory Visit (HOSPITAL_COMMUNITY)
Admission: RE | Admit: 2011-10-15 | Discharge: 2011-10-15 | Disposition: A | Discharge status: Home / Self Care | Payer: Medicare Other | Source: Ambulatory Visit | Attending: Internal Medicine | Admitting: Internal Medicine

## 2011-10-15 DIAGNOSIS — R609 Edema, unspecified: Secondary | ICD-10-CM

## 2011-10-15 DIAGNOSIS — M7989 Other specified soft tissue disorders: Secondary | ICD-10-CM

## 2011-10-15 DIAGNOSIS — R52 Pain, unspecified: Secondary | ICD-10-CM

## 2011-10-15 NOTE — Progress Notes (Signed)
VASCULAR LAB PRELIMINARY  PRELIMINARY  PRELIMINARY  PRELIMINARY  Right upper extremity venous duplex completed.    Preliminary report:  Right:  No evidence of DVT or superficial thrombosis.    Terance Hart, RVT 10/15/2011, 3:54 PM

## 2012-03-24 ENCOUNTER — Telehealth: Payer: Self-pay | Admitting: *Deleted

## 2012-03-24 NOTE — Telephone Encounter (Signed)
Returned call from pt who states "yesterday she noticed a knot on the right side of her neck just below the incision scar of her previous neck biopsy. She states the knot is "in front of her voice box, and has enlarged since yesterday". She states the knot has caused irritation w/swallowing and hoarseness. Pt denies fever, sore throat, cough, rhinorrhea. She has not sought care through her PCP. She is concerned due to her hx of nasopharyngeal cancer. Pt states Dr Lazarus Salines office is closed. She is requesting to see Dr Kathrynn Running. Advised pt will route her request to Dr Kathrynn Running.  3:13 pm Pt just called this office back stating her PCP is going to see her today,. So she does not need to see Dr Kathrynn Running. Advised pt to call this office if there is anything we can do for her. She verbalized understanding.

## 2012-04-04 ENCOUNTER — Inpatient Hospital Stay (HOSPITAL_COMMUNITY)
Admission: EM | Admit: 2012-04-04 | Discharge: 2012-04-06 | DRG: 694 | Disposition: A | Discharge status: Home / Self Care | Payer: PRIVATE HEALTH INSURANCE | Source: Ambulatory Visit | Attending: Internal Medicine | Admitting: Internal Medicine

## 2012-04-04 ENCOUNTER — Encounter (HOSPITAL_COMMUNITY): Payer: Self-pay | Admitting: *Deleted

## 2012-04-04 DIAGNOSIS — Z880 Allergy status to penicillin: Secondary | ICD-10-CM

## 2012-04-04 DIAGNOSIS — Z882 Allergy status to sulfonamides status: Secondary | ICD-10-CM

## 2012-04-04 DIAGNOSIS — Z881 Allergy status to other antibiotic agents status: Secondary | ICD-10-CM

## 2012-04-04 DIAGNOSIS — Z79899 Other long term (current) drug therapy: Secondary | ICD-10-CM

## 2012-04-04 DIAGNOSIS — IMO0002 Reserved for concepts with insufficient information to code with codable children: Secondary | ICD-10-CM

## 2012-04-04 DIAGNOSIS — M329 Systemic lupus erythematosus, unspecified: Secondary | ICD-10-CM

## 2012-04-04 DIAGNOSIS — R768 Other specified abnormal immunological findings in serum: Secondary | ICD-10-CM | POA: Diagnosis present

## 2012-04-04 DIAGNOSIS — E139 Other specified diabetes mellitus without complications: Secondary | ICD-10-CM | POA: Diagnosis present

## 2012-04-04 DIAGNOSIS — Z7952 Long term (current) use of systemic steroids: Secondary | ICD-10-CM

## 2012-04-04 DIAGNOSIS — N201 Calculus of ureter: Principal | ICD-10-CM | POA: Diagnosis present

## 2012-04-04 DIAGNOSIS — N390 Urinary tract infection, site not specified: Secondary | ICD-10-CM

## 2012-04-04 DIAGNOSIS — N200 Calculus of kidney: Secondary | ICD-10-CM

## 2012-04-04 DIAGNOSIS — T380X5A Adverse effect of glucocorticoids and synthetic analogues, initial encounter: Secondary | ICD-10-CM | POA: Diagnosis present

## 2012-04-04 DIAGNOSIS — N1330 Unspecified hydronephrosis: Secondary | ICD-10-CM

## 2012-04-04 HISTORY — DX: Type 2 diabetes mellitus without complications: E11.9

## 2012-04-04 HISTORY — DX: Systemic lupus erythematosus, unspecified: M32.9

## 2012-04-04 HISTORY — DX: Reserved for concepts with insufficient information to code with codable children: IMO0002

## 2012-04-04 HISTORY — DX: Malignant (primary) neoplasm, unspecified: C80.1

## 2012-04-04 NOTE — ED Notes (Signed)
1 attempt at labs with no success by emt.  Phlebotomy called with no response.

## 2012-04-04 NOTE — ED Notes (Signed)
Pt states that she has been having abdominal pain since Sat. Pt ate then all day Sat, Sun, Mon, and today she has been vomiting. Pt states unable to take medications, pt states chills and fever as well for the past 2 days.

## 2012-04-05 ENCOUNTER — Emergency Department (HOSPITAL_COMMUNITY): Payer: PRIVATE HEALTH INSURANCE

## 2012-04-05 ENCOUNTER — Encounter (HOSPITAL_COMMUNITY): Payer: Self-pay | Admitting: *Deleted

## 2012-04-05 DIAGNOSIS — M329 Systemic lupus erythematosus, unspecified: Secondary | ICD-10-CM

## 2012-04-05 DIAGNOSIS — N1330 Unspecified hydronephrosis: Secondary | ICD-10-CM

## 2012-04-05 DIAGNOSIS — N200 Calculus of kidney: Secondary | ICD-10-CM | POA: Diagnosis present

## 2012-04-05 DIAGNOSIS — R768 Other specified abnormal immunological findings in serum: Secondary | ICD-10-CM | POA: Diagnosis present

## 2012-04-05 DIAGNOSIS — Z7952 Long term (current) use of systemic steroids: Secondary | ICD-10-CM

## 2012-04-05 DIAGNOSIS — N390 Urinary tract infection, site not specified: Secondary | ICD-10-CM | POA: Diagnosis present

## 2012-04-05 HISTORY — DX: Calculus of kidney: N20.0

## 2012-04-05 HISTORY — DX: Long term (current) use of systemic steroids: Z79.52

## 2012-04-05 HISTORY — DX: Unspecified hydronephrosis: N13.30

## 2012-04-05 HISTORY — DX: Other specified abnormal immunological findings in serum: R76.8

## 2012-04-05 LAB — CBC WITH DIFFERENTIAL/PLATELET
Basophils Absolute: 0 10*3/uL (ref 0.0–0.1)
Eosinophils Absolute: 0.1 10*3/uL (ref 0.0–0.7)
Eosinophils Relative: 1 % (ref 0–5)
HCT: 43.3 % (ref 36.0–46.0)
Lymphocytes Relative: 14 % (ref 12–46)
MCH: 31.7 pg (ref 26.0–34.0)
MCV: 91 fL (ref 78.0–100.0)
Monocytes Absolute: 1 10*3/uL (ref 0.1–1.0)
Platelets: 449 10*3/uL — ABNORMAL HIGH (ref 150–400)
RDW: 14.7 % (ref 11.5–15.5)
WBC: 15.8 10*3/uL — ABNORMAL HIGH (ref 4.0–10.5)

## 2012-04-05 LAB — COMPREHENSIVE METABOLIC PANEL
AST: 20 U/L (ref 0–37)
CO2: 19 mEq/L (ref 19–32)
Calcium: 9.9 mg/dL (ref 8.4–10.5)
Creatinine, Ser: 1.77 mg/dL — ABNORMAL HIGH (ref 0.50–1.10)
GFR calc Af Amer: 38 mL/min — ABNORMAL LOW (ref >90)
GFR calc non Af Amer: 33 mL/min — ABNORMAL LOW (ref >90)
Glucose, Bld: 110 mg/dL — ABNORMAL HIGH (ref 70–99)
Sodium: 138 mEq/L (ref 135–145)
Total Protein: 9 g/dL — ABNORMAL HIGH (ref 6.0–8.3)

## 2012-04-05 LAB — URINALYSIS, ROUTINE W REFLEX MICROSCOPIC
Protein, ur: >300 mg/dL — AB
Specific Gravity, Urine: 1.03 (ref 1.005–1.030)
Urobilinogen, UA: 1 mg/dL (ref 0.0–1.0)

## 2012-04-05 LAB — CBC
MCH: 30.9 pg (ref 26.0–34.0)
MCHC: 34 g/dL (ref 30.0–36.0)
Platelets: 387 10*3/uL (ref 150–400)
RDW: 14.7 % (ref 11.5–15.5)

## 2012-04-05 LAB — URINE MICROSCOPIC-ADD ON

## 2012-04-05 LAB — GLUCOSE, CAPILLARY
Glucose-Capillary: 120 mg/dL — ABNORMAL HIGH (ref 70–99)
Glucose-Capillary: 191 mg/dL — ABNORMAL HIGH (ref 70–99)

## 2012-04-05 LAB — CREATININE, SERUM
Creatinine, Ser: 1.41 mg/dL — ABNORMAL HIGH (ref 0.50–1.10)
GFR calc non Af Amer: 44 mL/min — ABNORMAL LOW (ref >90)

## 2012-04-05 LAB — POTASSIUM: Potassium: 3 mEq/L — ABNORMAL LOW (ref 3.5–5.1)

## 2012-04-05 LAB — CLOSTRIDIUM DIFFICILE BY PCR: Toxigenic C. Difficile by PCR: NEGATIVE

## 2012-04-05 LAB — GENTAMICIN LEVEL, RANDOM: Gentamicin Rm: 4.3 ug/mL

## 2012-04-05 MED ORDER — HYDROMORPHONE HCL PF 1 MG/ML IJ SOLN
1.0000 mg | Freq: Once | INTRAMUSCULAR | Status: AC
  Administered 2012-04-05: 1 mg via INTRAVENOUS
  Filled 2012-04-05: qty 1

## 2012-04-05 MED ORDER — ONDANSETRON HCL 4 MG PO TABS: 4.0000 mg | ORAL_TABLET | Freq: Four times a day (QID) | ORAL | Status: DC | PRN

## 2012-04-05 MED ORDER — IOHEXOL 300 MG/ML  SOLN
80.0000 mL | Freq: Once | INTRAMUSCULAR | Status: AC | PRN
  Administered 2012-04-05: 80 mL via INTRAVENOUS

## 2012-04-05 MED ORDER — INSULIN ASPART 100 UNIT/ML ~~LOC~~ SOLN: 0.0000 [IU] | Freq: Every day | SUBCUTANEOUS | Status: DC

## 2012-04-05 MED ORDER — PANTOPRAZOLE SODIUM 40 MG PO TBEC
40.0000 mg | DELAYED_RELEASE_TABLET | Freq: Every day | ORAL | Status: DC
  Administered 2012-04-05: 40 mg via ORAL
  Filled 2012-04-05: qty 1

## 2012-04-05 MED ORDER — CLINDAMYCIN PHOSPHATE 600 MG/50ML IV SOLN
600.0000 mg | Freq: Three times a day (TID) | INTRAVENOUS | Status: DC
  Administered 2012-04-05 – 2012-04-06 (×4): 600 mg via INTRAVENOUS
  Filled 2012-04-05 (×6): qty 50

## 2012-04-05 MED ORDER — LORAZEPAM 1 MG PO TABS
1.0000 mg | ORAL_TABLET | Freq: Every day | ORAL | Status: DC
  Administered 2012-04-05: 1 mg via ORAL
  Filled 2012-04-05: qty 1

## 2012-04-05 MED ORDER — DEXTROSE 5 % IV SOLN
500.0000 mg | INTRAVENOUS | Status: DC
  Administered 2012-04-05: 500 mg via INTRAVENOUS
  Filled 2012-04-05 (×2): qty 12.5

## 2012-04-05 MED ORDER — METHOTREXATE 2.5 MG PO TABS: 2.5000 mg | ORAL_TABLET | ORAL | Status: DC

## 2012-04-05 MED ORDER — ONDANSETRON HCL 4 MG/2ML IJ SOLN
4.0000 mg | Freq: Once | INTRAMUSCULAR | Status: AC
  Administered 2012-04-05: 4 mg via INTRAVENOUS
  Filled 2012-04-05: qty 2

## 2012-04-05 MED ORDER — POTASSIUM CHLORIDE CRYS ER 20 MEQ PO TBCR
40.0000 meq | EXTENDED_RELEASE_TABLET | Freq: Two times a day (BID) | ORAL | Status: AC
  Administered 2012-04-05 (×2): 40 meq via ORAL
  Filled 2012-04-05 (×3): qty 2

## 2012-04-05 MED ORDER — SODIUM CHLORIDE 0.9 % IV SOLN
1000.0000 mL | Freq: Once | INTRAVENOUS | Status: AC
  Administered 2012-04-05: 1000 mL via INTRAVENOUS

## 2012-04-05 MED ORDER — TRAMADOL HCL 50 MG PO TABS
50.0000 mg | ORAL_TABLET | Freq: Two times a day (BID) | ORAL | Status: DC
  Administered 2012-04-05 (×2): 50 mg via ORAL
  Filled 2012-04-05 (×4): qty 1

## 2012-04-05 MED ORDER — GENTAMICIN SULFATE 40 MG/ML IJ SOLN
500.0000 mg | INTRAVENOUS | Status: DC
  Filled 2012-04-05: qty 12.5

## 2012-04-05 MED ORDER — GABAPENTIN 100 MG PO CAPS
100.0000 mg | ORAL_CAPSULE | Freq: Two times a day (BID) | ORAL | Status: DC
  Administered 2012-04-05 (×2): 100 mg via ORAL
  Filled 2012-04-05 (×4): qty 1

## 2012-04-05 MED ORDER — ADULT MULTIVITAMIN W/MINERALS CH
1.0000 | ORAL_TABLET | Freq: Every day | ORAL | Status: DC
  Administered 2012-04-05: 1 via ORAL
  Filled 2012-04-05 (×2): qty 1

## 2012-04-05 MED ORDER — HYDROXYZINE HCL 10 MG PO TABS
10.0000 mg | ORAL_TABLET | Freq: Two times a day (BID) | ORAL | Status: DC
  Administered 2012-04-05 – 2012-04-06 (×3): 10 mg via ORAL
  Filled 2012-04-05 (×6): qty 1

## 2012-04-05 MED ORDER — LEVOTHYROXINE SODIUM 75 MCG PO TABS
75.0000 ug | ORAL_TABLET | Freq: Every day | ORAL | Status: DC
  Administered 2012-04-05 – 2012-04-06 (×2): 75 ug via ORAL
  Filled 2012-04-05 (×3): qty 1

## 2012-04-05 MED ORDER — KETOROLAC TROMETHAMINE 15 MG/ML IJ SOLN
15.0000 mg | Freq: Four times a day (QID) | INTRAMUSCULAR | Status: AC
  Administered 2012-04-05 (×3): 15 mg via INTRAVENOUS
  Filled 2012-04-05 (×3): qty 1

## 2012-04-05 MED ORDER — DOCUSATE SODIUM 100 MG PO CAPS
100.0000 mg | ORAL_CAPSULE | Freq: Two times a day (BID) | ORAL | Status: DC
  Filled 2012-04-05 (×2): qty 1

## 2012-04-05 MED ORDER — HEPARIN SODIUM (PORCINE) 5000 UNIT/ML IJ SOLN
5000.0000 [IU] | Freq: Three times a day (TID) | INTRAMUSCULAR | Status: DC
  Administered 2012-04-05 – 2012-04-06 (×4): 5000 [IU] via SUBCUTANEOUS
  Filled 2012-04-05 (×7): qty 1

## 2012-04-05 MED ORDER — QUETIAPINE FUMARATE 100 MG PO TABS
100.0000 mg | ORAL_TABLET | Freq: Every day | ORAL | Status: DC
  Administered 2012-04-05: 100 mg via ORAL
  Filled 2012-04-05 (×2): qty 1

## 2012-04-05 MED ORDER — HYDROMORPHONE HCL PF 1 MG/ML IJ SOLN
1.0000 mg | INTRAMUSCULAR | Status: DC | PRN
  Administered 2012-04-05 – 2012-04-06 (×2): 1 mg via INTRAVENOUS
  Filled 2012-04-05 (×2): qty 1

## 2012-04-05 MED ORDER — ALBUTEROL SULFATE HFA 108 (90 BASE) MCG/ACT IN AERS: 2.0000 | INHALATION_SPRAY | RESPIRATORY_TRACT | Status: DC | PRN

## 2012-04-05 MED ORDER — FOLIC ACID 1 MG PO TABS
1.0000 mg | ORAL_TABLET | Freq: Every day | ORAL | Status: DC
  Administered 2012-04-05: 1 mg via ORAL
  Filled 2012-04-05 (×2): qty 1

## 2012-04-05 MED ORDER — INSULIN ASPART 100 UNIT/ML ~~LOC~~ SOLN: 0.0000 [IU] | Freq: Three times a day (TID) | SUBCUTANEOUS | Status: DC

## 2012-04-05 MED ORDER — SODIUM CHLORIDE 0.9 % IV SOLN
INTRAVENOUS | Status: DC
  Administered 2012-04-05 (×2): via INTRAVENOUS

## 2012-04-05 MED ORDER — PREDNISONE 20 MG PO TABS
40.0000 mg | ORAL_TABLET | Freq: Every day | ORAL | Status: DC
  Administered 2012-04-05 – 2012-04-06 (×2): 40 mg via ORAL
  Filled 2012-04-05 (×3): qty 2

## 2012-04-05 MED ORDER — HYDROCORTISONE SOD SUCCINATE 100 MG IJ SOLR
50.0000 mg | Freq: Three times a day (TID) | INTRAMUSCULAR | Status: AC
  Administered 2012-04-05 (×3): 50 mg via INTRAVENOUS
  Filled 2012-04-05 (×3): qty 1

## 2012-04-05 MED ORDER — ONDANSETRON HCL 4 MG/2ML IJ SOLN: 4.0000 mg | Freq: Four times a day (QID) | INTRAMUSCULAR | Status: DC | PRN

## 2012-04-05 MED ORDER — SODIUM CHLORIDE 0.9 % IV SOLN: INTRAVENOUS | Status: DC

## 2012-04-05 MED ORDER — HYDROMORPHONE HCL PF 1 MG/ML IJ SOLN
1.0000 mg | INTRAMUSCULAR | Status: DC | PRN
  Administered 2012-04-05: 1 mg via INTRAVENOUS
  Filled 2012-04-05 (×2): qty 1

## 2012-04-05 MED ORDER — SODIUM CHLORIDE 0.9 % IV SOLN
1000.0000 mL | INTRAVENOUS | Status: DC
  Administered 2012-04-05: 1000 mL via INTRAVENOUS

## 2012-04-05 NOTE — ED Notes (Signed)
MD at bedside. 

## 2012-04-05 NOTE — Consult Note (Signed)
Urology Consult  Referring physician: Dr. Conley Rolls Reason for referral: ureteral stone  Chief Complaint: abdominal pain History of Present Illness:  46 year old female admitted  with complaints of left-sided abdominal pain, nausea vomiting. Symptoms started on Sunday with abdominal pain. She has since had nausea vomiting and episodes of loose stools. Diarrhea stopped earlier today.    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 She denies any fevers, but has had hot and cold sensations. No sick contacts, no recent travel. Patient was on clindamycin for sore throat recently, stopped before the end of the antibiotics as she was not tolerating the medication well. She stopped the antibiotics on Friday. No blood or mucus seen in the stool. No prior history of diverticulitis or diverticulosis. No prior abdominal surgeries. No history of C. difficile colitis.   Past Medical History  Diagnosis Date  . Lupus   . Cancer     nasopharenx  . Diabetes mellitus without complication     related to prednisone use   History reviewed. No pertinent past surgical history.  Medications: I have reviewed the patient's current medications. Allergies:  Allergies  Allergen Reactions  . Amoxicillin Itching  . Bactrim (Sulfamethoxazole W-Trimethoprim) Rash  . Ciprofloxacin Hcl Rash  . Keflex (Cephalexin) Rash  . Penicillins Rash  . Tetracyclines & Related Rash    History reviewed. No pertinent family history. Social History:  does not have a smoking history on file. She does not have any smokeless tobacco history on file. Her alcohol and drug histories not on file.  ROS: All systems are reviewed and negative except as noted. Constitutional: Negative for malaise, fever and chills. No significant weight loss or weight gain  Eyes: Negative for eye pain, redness and discharge, diplopia, visual changes, or flashes of light.  ENMT: Negative for ear  pain, hoarseness, nasal congestion, sinus pressure and sore throat. No headaches; tinnitus, drooling, or problem swallowing.  Cardiovascular: Negative for chest pain, palpitations, diaphoresis, dyspnea and peripheral edema. ; No orthopnea, PND  Respiratory: Negative for cough, hemoptysis, wheezing and stridor. No pleuritic chestpain.  Gastrointestinal: Negative for nausea, vomiting, diarrhea, constipation, abdominal pain, melena, blood in stool, hematemesis, jaundice and rectal bleeding.  Genitourinary: Negative for frequency, incontinence, and hematuria;  Musculoskeletal: Negative for back pain and neck pain. Negative for swelling and trauma.;  Skin: . Negative for pruritus, rash, abrasions, bruising and skin lesion.; ulcerations  Neuro: Negative for headache, lightheadedness and neck stiffness. Negative for weakness, altered level of consciousness , altered mental status, extremity weakness, burning feet, involuntary movement, seizure and syncope.  Psych: negative for anxiety, depression, insomnia, tearfulness, panic attacks, hallucinations, paranoia, suicidal or homicidal ideation   Physical Exam:  Vital signs in last 24 hours: Temp:  [98 F (36.7 C)-98.5 F (36.9 C)] 98.2 F (36.8 C) (10/16 0500) Pulse Rate:  [80-112] 80  (10/16 0500) Resp:  [18-20] 19  (10/16 0500) BP: (128-138)/(73-98) 128/73 mmHg (10/16 0500) SpO2:  [100 %] 100 % (10/16 0500) Weight:  [91.808 kg (202 lb 6.4 oz)] 91.808 kg (202 lb 6.4 oz) (10/16 0500)  Cardiovascular: Skin warm; not flushed Respiratory: Breaths quiet; no shortness of breath Abdomen: No masses. L cva pain, mild. LLQ pain, mild. No rebound.  Neurological: Normal sensation to touch Musculoskeletal: Normal motor function arms and legs Lymphatics: No inguinal adenopathy Skin: No rashes Genitourinary:Normal BUS  Laboratory Data:  Results for orders placed during the hospital encounter of 04/04/12 (from the past 72 hour(s))  CBC WITH DIFFERENTIAL      Status: Abnormal   Collection Time   04/04/12 10:49 PM      Component Value Range Comment   WBC 15.8 (*) 4.0 - 10.5 K/uL    RBC 4.76  3.87 - 5.11 MIL/uL    Hemoglobin 15.1 (*) 12.0 - 15.0 g/dL    HCT 41.3  24.4 - 01.0 %    MCV 91.0  78.0 - 100.0 fL    MCH 31.7  26.0 - 34.0 pg    MCHC 34.9  30.0 - 36.0 g/dL    RDW 27.2  53.6 - 64.4 %    Platelets 449 (*) 150 - 400 K/uL    Neutrophils Relative 79 (*) 43 - 77 %    Neutro Abs 12.5 (*) 1.7 - 7.7 K/uL    Lymphocytes Relative 14  12 - 46 %    Lymphs Abs 2.3  0.7 - 4.0 K/uL    Monocytes Relative 6  3 - 12 %    Monocytes Absolute 1.0  0.1 - 1.0 K/uL    Eosinophils Relative 1  0 - 5 %    Eosinophils Absolute 0.1  0.0 - 0.7 K/uL    Basophils Relative 0  0 - 1 %    Basophils Absolute 0.0  0.0 - 0.1 K/uL   COMPREHENSIVE METABOLIC PANEL     Status: Abnormal   Collection Time   04/04/12 10:49 PM      Component Value Range Comment   Sodium 138  135 - 145 mEq/L    Potassium 3.3 (*) 3.5 - 5.1 mEq/L    Chloride 104  96 - 112 mEq/L    CO2 19  19 - 32 mEq/L    Glucose, Bld 110 (*) 70 - 99 mg/dL    BUN 13  6 - 23 mg/dL    Creatinine, Ser 0.34 (*) 0.50 - 1.10 mg/dL    Calcium 9.9  8.4 - 74.2 mg/dL    Total Protein 9.0 (*) 6.0 - 8.3 g/dL    Albumin 4.0  3.5 - 5.2 g/dL    AST 20  0 - 37 U/L    ALT 9  0 - 35 U/L    Alkaline Phosphatase 67  39 - 117 U/L    Total Bilirubin 0.4  0.3 - 1.2 mg/dL    GFR calc non Af Amer 33 (*) >90 mL/min    GFR calc Af Amer 38 (*) >90 mL/min   URINALYSIS, ROUTINE W REFLEX MICROSCOPIC     Status: Abnormal   Collection Time   04/05/12 12:22 AM      Component Value Range Comment   Color, Urine RED (*) YELLOW BIOCHEMICALS MAY BE AFFECTED BY COLOR   APPearance CLOUDY (*) CLEAR    Specific Gravity, Urine 1.030  1.005 - 1.030    pH 5.5  5.0 - 8.0    Glucose, UA NEGATIVE  NEGATIVE mg/dL    Hgb urine dipstick LARGE (*) NEGATIVE    Bilirubin Urine SMALL (*) NEGATIVE    Ketones, ur 15 (*) NEGATIVE mg/dL    Protein, ur  >595 (*) NEGATIVE mg/dL    Urobilinogen, UA 1.0  0.0 - 1.0 mg/dL    Nitrite POSITIVE (*) NEGATIVE    Leukocytes, UA TRACE (*) NEGATIVE   URINE MICROSCOPIC-ADD ON     Status: Abnormal   Collection Time   04/05/12 12:22 AM      Component Value Range Comment   Squamous Epithelial / LPF FEW (*)  RARE    WBC, UA 3-6  <3 WBC/hpf    RBC / HPF TOO NUMEROUS TO COUNT  <3 RBC/hpf    Bacteria, UA MANY (*) RARE    Crystals CA OXALATE CRYSTALS (*) NEGATIVE   LIPASE, BLOOD     Status: Normal   Collection Time   04/05/12 12:34 AM      Component Value Range Comment   Lipase 14  11 - 59 U/L    No results found for this or any previous visit (from the past 240 hour(s)). Creatinine:  Basename 04/04/12 2249  CREATININE 1.77*    Xrays: CT ABDOMEN AND PELVIS WITH CONTRAST  Technique: Multidetector CT imaging of the abdomen and pelvis was  performed following the standard protocol during bolus  administration of intravenous contrast.  Contrast: 80mL OMNIPAQUE IOHEXOL 300 MG/ML SOLN  Comparison: None.  Findings: Atelectasis in the lung bases, greater on the right. The  liver, spleen, gallbladder, pancreas, adrenal glands, abdominal  aorta, and retroperitoneal lymph nodes are unremarkable. The right  kidney appears normal. There is a delayed nephrogram on the left  kidney with left renal swelling and para renal and periureteral  stranding. There is pyelocaliectasis and ureterectasis of the left  kidney. There is a 3 mm stone in the distal left ureter at the  ureterovesicle junction. Changes are consistent with moderate  obstruction. There is enhancement of the wall of the ureter  diffusely which might also represent infection.  The stomach, small bowel, and colon are not abnormally distended  and there is no significant wall thickening. No free air or free  fluid in the abdomen.  Pelvis: Somewhat nodular enlargement of the uterus suggesting  fibroids. No abnormal adnexal masses. The appendix is  normal.  Scattered diverticula in the sigmoid colon without diverticulitis.  No free or loculated pelvic fluid collections. There appears to be  a small amount of gas in the bladder without bladder wall  thickening. Changes could be due to instrumentation or infection.  Normal alignment of the lumbar vertebrae.  IMPRESSION:  3 mm moderately obstructing stone in the distal left ureter.  Enhancement of the ureteral wall and small amount of gas in the  bladder suggest possible infection. No evidence of colitis.  Original Report Authenticated By: Marlon Pel, M.D.     Impression/Assessment:    3mm Left distal U-V junction stone. Pt is covered with antibiotics, and SLE is treated by Dr. Conley Rolls. Will Rx with Flomax and toradol and urine straining.   Plan:  Flomax, toradol, and urine straining.  Jeral Zick I 04/05/2012, 6:30 AM

## 2012-04-05 NOTE — Progress Notes (Signed)
ANTIBIOTIC CONSULT NOTE - INITIAL  Pharmacy Consult for gentamicin  Indication: R/o UTI  Allergies  Allergen Reactions  . Amoxicillin Itching  . Bactrim (Sulfamethoxazole W-Trimethoprim) Rash  . Ciprofloxacin Hcl Rash  . Keflex (Cephalexin) Rash  . Penicillins Rash  . Tetracyclines & Related Rash    Patient Measurements: Height: 5\' 6"  (167.6 cm) Weight: 202 lb 6.4 oz (91.808 kg) IBW/kg (Calculated) : 59.3  AdjBW: 72 kg   Vital Signs: Temp: 98.2 F (36.8 C) (10/16 0500) Temp src: Oral (10/16 0500) BP: 128/73 mmHg (10/16 0500) Pulse Rate: 80  (10/16 0500) Intake/Output from previous day:   Intake/Output from this shift:    Labs:  Basename 04/04/12 2249  WBC 15.8*  HGB 15.1*  PLT 449*  LABCREA --  CREATININE 1.77*   Estimated Creatinine Clearance: 44.8 ml/min (by C-G formula based on Cr of 1.77). No results found for this basename: VANCOTROUGH:2,VANCOPEAK:2,VANCORANDOM:2,GENTTROUGH:2,GENTPEAK:2,GENTRANDOM:2,TOBRATROUGH:2,TOBRAPEAK:2,TOBRARND:2,AMIKACINPEAK:2,AMIKACINTROU:2,AMIKACIN:2, in the last 72 hours   Microbiology: No results found for this or any previous visit (from the past 720 hour(s)).  Medical History: Past Medical History  Diagnosis Date  . Lupus   . Cancer     nasopharenx  . Diabetes mellitus without complication     related to prednisone use    Medications:  Scheduled:    . sodium chloride  1,000 mL Intravenous Once  . clindamycin (CLEOCIN) IV  600 mg Intravenous Q8H  . docusate sodium  100 mg Oral BID  . folic acid  1 mg Oral Daily  . gabapentin  100 mg Oral BID  . heparin  5,000 Units Subcutaneous Q8H  . hydrocortisone sod succinate (SOLU-CORTEF) injection  50 mg Intravenous Q8H  . HYDROmorphone  1 mg Intravenous Once  . hydrOXYzine  10 mg Oral BID WC  . insulin aspart  0-20 Units Subcutaneous TID WC  . insulin aspart  0-5 Units Subcutaneous QHS  . levothyroxine  75 mcg Oral QAC breakfast  . LORazepam  1 mg Oral Daily  .  methotrexate  2.5 mg Oral Weekly  . multivitamin with minerals  1 tablet Oral Daily  . ondansetron  4 mg Intravenous Once  . pantoprazole  40 mg Oral Daily  . predniSONE  40 mg Oral QAC breakfast  . QUEtiapine  100 mg Oral QHS   Assessment: 46 yo female presented with abdominal pain. Pharmacy consulted to manage gentamicin for possible UTI and multiple antibiotic allergies.   Goal of Therapy:  Appropriate gentamicin frequency based on renal function (per [place] Nomogram)  Plan:  1. Gentamicin 500mg  IV x 1.  2. 10 hour gentamicin level.  Emeline Gins 04/05/2012,5:25 AM

## 2012-04-05 NOTE — ED Notes (Signed)
CT notified pt is done drinking her contrast

## 2012-04-05 NOTE — Progress Notes (Signed)
Antibiotic Consult Note: Gentamicin Indication: R/O UTI  Pt was given a 7mg /kg dose of Gentamicin (500 mg) and a blood level was drawn at 12.5 hrs which came back at 4.3 mcg/ml. According to the [facility] Nomogram, the dose should be given in this pt every 36 hrs.  Cardell Peach, PharmD

## 2012-04-05 NOTE — ED Provider Notes (Signed)
History     CSN: [id number]  Arrival date & time 04/04/12  2239   First MD Initiated Contact with Patient 04/05/12 0019      Chief Complaint  Patient presents with  . Abdominal Pain    (Consider location/radiation/quality/duration/timing/severity/associated sxs/prior treatment) Patient is a 46 y.o. female presenting with abdominal pain.  Abdominal Pain The primary symptoms of the illness include abdominal pain.   46 year old female presents to emergency room with complaint of left-sided abdominal pain, nausea vomiting. Symptoms started on Sunday with abdominal pain. She has since had nausea vomiting and episodes of loose stools. Diarrhea stopped earlier today. 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 She denies any fevers, but has had hot and cold sensations. No sick contacts, no recent travel. Patient was on clindamycin for sore throat recently, stopped before the end of the antibiotics as she was not tolerating the medication well. She stopped the antibiotics on Friday. No blood or mucus seen in the stool. No prior history of diverticulitis or diverticulosis. No prior abdominal surgeries. No history of C. difficile colitis.  Past Medical History  Diagnosis Date  . Lupus   . Cancer     nasopharenx  . Diabetes mellitus without complication     related to prednisone use    History reviewed. No pertinent past surgical history.  History reviewed. No pertinent family history.  History  Substance Use Topics  . Smoking status: Not on file  . Smokeless tobacco: Not on file  . Alcohol Use:     OB History    Grav Para Term Preterm Abortions TAB SAB Ect Mult Living                  Review of Systems  Gastrointestinal: Positive for abdominal pain.  All other systems reviewed and are negative.    Allergies  Amoxicillin; Bactrim; Ciprofloxacin hcl; Keflex; Penicillins; and Tetracyclines &  related  Home Medications   Current Outpatient Rx  Name Route Sig Dispense Refill  . ALBUTEROL SULFATE HFA 108 (90 BASE) MCG/ACT IN AERS Inhalation Inhale 2 puffs into the lungs every 6 (six) hours as needed. For breathing    . CELECOXIB 200 MG PO CAPS Oral Take 200 mg by mouth daily.    . CYCLOBENZAPRINE HCL 10 MG PO TABS Oral Take 10 mg by mouth 2 (two) times daily as needed. For pain    . FOLIC ACID 1 MG PO TABS Oral Take 1 mg by mouth daily.    Marland Kitchen GABAPENTIN 100 MG PO CAPS Oral Take 100 mg by mouth 2 (two) times daily.    Marland Kitchen LEVOTHYROXINE SODIUM 75 MCG PO TABS Oral Take 75 mcg by mouth daily.    Marland Kitchen LORAZEPAM 1 MG PO TABS Oral Take 1 mg by mouth daily.    Marland Kitchen METHOTREXATE 2.5 MG PO TABS Oral Take 2.5 mg by mouth once a week. On mondays    . ADULT MULTIVITAMIN W/MINERALS CH Oral Take 1 tablet by mouth daily.    Marland Kitchen PANTOPRAZOLE SODIUM 40 MG PO TBEC Oral Take 40 mg by mouth daily.    Marland Kitchen PREDNISONE 5 MG PO TABS Oral Take 5 mg by mouth daily.    . TRAMADOL HCL 50 MG PO TABS Oral Take 50 mg by mouth 2 (two) times daily.    Marland Kitchen VITAMIN E PO Oral Take 1 tablet by mouth daily.      BP 138/98  Pulse 112  Temp 98  F (36.7 C) (Oral)  Resp 18  SpO2 100%  Physical Exam  Nursing note and vitals reviewed. Constitutional: She is oriented to person, place, and time. She appears well-developed and well-nourished.  HENT:  Head: Normocephalic and atraumatic.  Nose: Nose normal.  Mouth/Throat: Oropharynx is clear and moist.  Eyes: Conjunctivae normal and EOM are normal. Pupils are equal, round, and reactive to light.  Neck: Normal range of motion. Neck supple. No JVD present. No tracheal deviation present. No thyromegaly present.  Cardiovascular: Normal rate, regular rhythm, normal heart sounds and intact distal pulses.  Exam reveals no gallop and no friction rub.   No murmur heard. Pulmonary/Chest: Effort normal and breath sounds normal. No stridor. No respiratory distress. She has no wheezes. She has no  rales. She exhibits no tenderness.  Abdominal: Soft. Bowel sounds are normal. She exhibits no distension and no mass. There is tenderness (significant pain with palpation of left upper quadrant left lower quadrant and in suprapubic region). There is no rebound and no guarding.  Musculoskeletal: Normal range of motion. She exhibits no edema and no tenderness.  Lymphadenopathy:    She has no cervical adenopathy.  Neurological: She is alert and oriented to person, place, and time. She exhibits normal muscle tone. Coordination normal.  Skin: Skin is warm and dry. Rash (Scaly rash scattered legs, arms, neck consistent with psoriasis-like rash) noted. No erythema. No pallor.  Psychiatric: She has a normal mood and affect. Her behavior is normal. Judgment and thought content normal.    ED Course  Procedures (including critical care time)  Labs Reviewed  CBC WITH DIFFERENTIAL - Abnormal; Notable for the following:    WBC 15.8 (*)     Hemoglobin 15.1 (*)     Platelets 449 (*)     Neutrophils Relative 79 (*)     Neutro Abs 12.5 (*)     All other components within normal limits  COMPREHENSIVE METABOLIC PANEL - Abnormal; Notable for the following:    Potassium 3.3 (*)     Glucose, Bld 110 (*)     Creatinine, Ser 1.77 (*)     Total Protein 9.0 (*)     GFR calc non Af Amer 33 (*)     GFR calc Af Amer 38 (*)     All other components within normal limits  URINALYSIS, ROUTINE W REFLEX MICROSCOPIC - Abnormal; Notable for the following:    Color, Urine RED (*)  BIOCHEMICALS MAY BE AFFECTED BY COLOR   APPearance CLOUDY (*)     Hgb urine dipstick LARGE (*)     Bilirubin Urine SMALL (*)     Ketones, ur 15 (*)     Protein, ur >300 (*)     Nitrite POSITIVE (*)     Leukocytes, UA TRACE (*)     All other components within normal limits  URINE MICROSCOPIC-ADD ON - Abnormal; Notable for the following:    Squamous Epithelial / LPF FEW (*)     Bacteria, UA MANY (*)     Crystals CA OXALATE CRYSTALS (*)      All other components within normal limits  LIPASE, BLOOD  CLOSTRIDIUM DIFFICILE BY PCR  URINE CULTURE  URINE CULTURE  CBC  CREATININE, SERUM  TSH   Ct Abdomen Pelvis W Contrast  04/05/2012  *RADIOLOGY REPORT*  Clinical Data: Abdominal pain, nausea, vomiting, and diarrhea.  CT ABDOMEN AND PELVIS WITH CONTRAST  Technique:  Multidetector CT imaging of the abdomen and pelvis was performed following the  standard protocol during bolus administration of intravenous contrast.  Contrast: 80mL OMNIPAQUE IOHEXOL 300 MG/ML  SOLN  Comparison: None.  Findings: Atelectasis in the lung bases, greater on the right.  The liver, spleen, gallbladder, pancreas, adrenal glands, abdominal aorta, and retroperitoneal lymph nodes are unremarkable.  The right kidney appears normal.  There is a delayed nephrogram on the left kidney with left renal swelling and para renal and periureteral stranding.  There is pyelocaliectasis and ureterectasis of the left kidney.  There is a 3 mm stone in the distal left ureter at the ureterovesicle junction.  Changes are consistent with moderate obstruction.  There is enhancement of the wall of the ureter diffusely which might also represent infection.  The stomach, small bowel, and colon are not abnormally distended and there is no significant wall thickening.  No free air or free fluid in the abdomen.  Pelvis:  Somewhat nodular enlargement of the uterus suggesting fibroids.  No abnormal adnexal masses.  The appendix is normal. Scattered diverticula in the sigmoid colon without diverticulitis. No free or loculated pelvic fluid collections.  There appears to be a small amount of gas in the bladder without bladder wall thickening.  Changes could be due to instrumentation or infection. Normal alignment of the lumbar vertebrae.  IMPRESSION: 3 mm moderately obstructing stone in the distal left ureter. Enhancement of the ureteral wall and small amount of gas in the bladder suggest possible infection.   No evidence of colitis.   Original Report Authenticated By: Marlon Pel, M.D.      1. Kidney stone on left side   2. Hydronephrosis   3. UTI (lower urinary tract infection)   4. Chronic use of steroids   5. Lupus (systemic lupus erythematosus)       MDM  46 year old female with history of lupus on steroids with left-sided abdominal pain nausea vomiting and diarrhea. She was recently on clindamycin for upper respiratory illness Will get labs, UA, CT scan of abdomen. Concern for colitis given recent antibiotics, will get C. difficile PCR if he she is able to have a bowel movement. Also concern for diverticulitis, or intestinal abscess given her relative immunocompromised state.   And found to have 3 mm stone at UVJ. Urine significant for nitrites. There is enhancement of the wall of the ureter as well as gas in the bladder which suggest possible infection. Patient is relatively immunocompromised given her chronic steroid use. Discussed case with Dr. Patsi Sears, who does not feel patient requires surgery at this time. He will have someone from the group see her in the morning. Discuss with Dr. Nedra Hai who will admit her to the hospital.      Olivia Mackie, MD 04/05/12 [phone]

## 2012-04-05 NOTE — H&P (Signed)
[facility] History and Physical  [name] ZOX:[id number] DOB: Nov 01, 1965    PCP:   Dorrene German, MD   Chief Complaint: left flank pain.  HPI: [name] is an 46 y.o. female with hx of Lupus, on MTX and chronic prednisone, Hx of steroid induced diabetes, presents to the ER with left flank pain, nausea and diarrhea.  She had a sorethroat a few days ago, and was placed on Clindamycin, but she stopped taking them because she was having diarhea.  Evaluation in the ER showed leukocytosis with WBC of 15K, UA showed TNTC RBC, and 3-6 WBC with positive nitrite. A abd/Pelvic CT was done which showed a 3mm on the uretovesicular Jx with moderate hydronephrosis and evidence of infection.  EDP consulted Dr Patsi Sears and asked hospitalist to admit patient for nephrolithiasis with hydronephrosis with possible UTI.  Rewiew of Systems:  Constitutional: Negative for malaise, fever and chills. No significant weight loss or weight gain Eyes: Negative for eye pain, redness and discharge, diplopia, visual changes, or flashes of light. ENMT: Negative for ear pain, hoarseness, nasal congestion, sinus pressure and sore throat. No headaches; tinnitus, drooling, or problem swallowing. Cardiovascular: Negative for chest pain, palpitations, diaphoresis, dyspnea and peripheral edema. ; No orthopnea, PND Respiratory: Negative for cough, hemoptysis, wheezing and stridor. No pleuritic chestpain. Gastrointestinal: Negative for nausea, vomiting, diarrhea, constipation, abdominal pain, melena, blood in stool, hematemesis, jaundice and rectal bleeding.    Genitourinary: Negative for frequency, incontinence, and hematuria; Musculoskeletal: Negative for back pain and neck pain. Negative for swelling and trauma.;  Skin: . Negative for pruritus, rash, abrasions, bruising and skin lesion.; ulcerations Neuro: Negative for headache, lightheadedness and neck stiffness. Negative for weakness, altered level of  consciousness , altered mental status, extremity weakness, burning feet, involuntary movement, seizure and syncope.  Psych: negative for anxiety, depression, insomnia, tearfulness, panic attacks, hallucinations, paranoia, suicidal or homicidal ideation    Past Medical History  Diagnosis Date  . Lupus   . Cancer     nasopharenx  . Diabetes mellitus without complication     related to prednisone use    History reviewed. No pertinent past surgical history.  Medications:  HOME MEDS: Prior to Admission medications   Medication Sig Start Date End Date Taking? Authorizing Provider  albuterol (PROVENTIL HFA;VENTOLIN HFA) 108 (90 BASE) MCG/ACT inhaler Inhale 2 puffs into the lungs every 6 (six) hours as needed. For breathing   Yes Historical Provider, MD  celecoxib (CELEBREX) 200 MG capsule Take 200 mg by mouth daily.   Yes Historical Provider, MD  cyclobenzaprine (FLEXERIL) 10 MG tablet Take 10 mg by mouth 2 (two) times daily as needed. For pain   Yes Historical Provider, MD  folic acid (FOLVITE) 1 MG tablet Take 1 mg by mouth daily.   Yes Historical Provider, MD  gabapentin (NEURONTIN) 100 MG capsule Take 100 mg by mouth 2 (two) times daily.   Yes Historical Provider, MD  levothyroxine (SYNTHROID, LEVOTHROID) 75 MCG tablet Take 75 mcg by mouth daily.   Yes Historical Provider, MD  LORazepam (ATIVAN) 1 MG tablet Take 1 mg by mouth daily.   Yes Historical Provider, MD  methotrexate (RHEUMATREX) 2.5 MG tablet Take 2.5 mg by mouth once a week. On mondays   Yes Historical Provider, MD  Multiple Vitamin (MULTIVITAMIN WITH MINERALS) TABS Take 1 tablet by mouth daily.   Yes Historical Provider, MD  pantoprazole (PROTONIX) 40 MG tablet Take 40 mg by mouth daily.   Yes Historical Provider, MD  predniSONE (DELTASONE) 5 MG tablet Take 5 mg by mouth daily.   Yes Historical Provider, MD  traMADol (ULTRAM) 50 MG tablet Take 50 mg by mouth 2 (two) times daily.   Yes Historical Provider, MD  VITAMIN E PO  Take 1 tablet by mouth daily.   Yes Historical Provider, MD     Allergies:  Allergies  Allergen Reactions  . Amoxicillin Itching  . Bactrim (Sulfamethoxazole W-Trimethoprim) Rash  . Ciprofloxacin Hcl Rash  . Keflex (Cephalexin) Rash  . Penicillins Rash  . Tetracyclines & Related Rash    Social History:   does not have a smoking history on file. She does not have any smokeless tobacco history on file. Her alcohol and drug histories not on file.  Family History: History reviewed. No pertinent family history.   Physical Exam: Filed Vitals:   04/04/12 2247 04/05/12 0431 04/05/12 0500  BP: 138/98 135/74 128/73  Pulse: 112 87 80  Temp: 98 F (36.7 C) 98.5 F (36.9 C) 98.2 F (36.8 C)  TempSrc: Oral Oral Oral  Resp: 18 20 19   Height:   5\' 6"  (1.676 m)  Weight:   91.808 kg (202 lb 6.4 oz)  SpO2: 100% 100% 100%   Blood pressure 128/73, pulse 80, temperature 98.2 F (36.8 C), temperature source Oral, resp. rate 19, height 5\' 6"  (1.676 m), weight 91.808 kg (202 lb 6.4 oz), last menstrual period 01/04/2012, SpO2 100.00%.  GEN:  Pleasant  patient lying in the stretcher in no acute distress; cooperative with exam. PSYCH:  alert and oriented x4; does not appear anxious or depressed; affect is appropriate. HEENT: Mucous membranes pink and anicteric; PERRLA; EOM intact; no cervical lymphadenopathy nor thyromegaly or carotid bruit; no JVD; There were no stridor. Neck is very supple. Breasts:: Not examined CHEST WALL: No tenderness CHEST: Normal respiration, clear to auscultation bilaterally.  HEART: Regular rate and rhythm.  There are no murmur, rub, or gallops.   BACK:  CVA tenderness on the left flank. ABDOMEN: soft and non-tender; no masses, no organomegaly, normal abdominal bowel sounds; no pannus; no intertriginous candida. There is no rebound and no distention. Rectal Exam: Not done EXTREMITIES: No bone or joint deformity; age-appropriate arthropathy of the hands and knees; no  edema; no ulcerations.  There is no calf tenderness. Genitalia: not examined PULSES: 2+ and symmetric SKIN: Normal hydration no rash or ulceration CNS: Cranial nerves 2-12 grossly intact no focal lateralizing neurologic deficit.  Speech is fluent; uvula elevated with phonation, facial symmetry and tongue midline. DTR are normal bilaterally, cerebella exam is intact, barbinski is negative and strengths are equaled bilaterally.  No sensory loss.   Labs on Admission:  Basic Metabolic Panel:  Lab 04/04/12 4098  NA 138  K 3.3*  CL 104  CO2 19  GLUCOSE 110*  BUN 13  CREATININE 1.77*  CALCIUM 9.9  MG --  PHOS --   Liver Function Tests:  Lab 04/04/12 2249  AST 20  ALT 9  ALKPHOS 67  BILITOT 0.4  PROT 9.0*  ALBUMIN 4.0    Lab 04/05/12 0034  LIPASE 14  AMYLASE --   No results found for this basename: AMMONIA:5 in the last 168 hours CBC:  Lab 04/04/12 2249  WBC 15.8*  NEUTROABS 12.5*  HGB 15.1*  HCT 43.3  MCV 91.0  PLT 449*   Cardiac Enzymes: No results found for this basename: CKTOTAL:5,CKMB:5,CKMBINDEX:5,TROPONINI:5 in the last 168 hours  CBG: No results found for this basename: GLUCAP:5 in the last 168 hours  Radiological Exams on Admission: Ct Abdomen Pelvis W Contrast  04/05/2012  *RADIOLOGY REPORT*  Clinical Data: Abdominal pain, nausea, vomiting, and diarrhea.  CT ABDOMEN AND PELVIS WITH CONTRAST  Technique:  Multidetector CT imaging of the abdomen and pelvis was performed following the standard protocol during bolus administration of intravenous contrast.  Contrast: 80mL OMNIPAQUE IOHEXOL 300 MG/ML  SOLN  Comparison: None.  Findings: Atelectasis in the lung bases, greater on the right.  The liver, spleen, gallbladder, pancreas, adrenal glands, abdominal aorta, and retroperitoneal lymph nodes are unremarkable.  The right kidney appears normal.  There is a delayed nephrogram on the left kidney with left renal swelling and para renal and periureteral stranding.   There is pyelocaliectasis and ureterectasis of the left kidney.  There is a 3 mm stone in the distal left ureter at the ureterovesicle junction.  Changes are consistent with moderate obstruction.  There is enhancement of the wall of the ureter diffusely which might also represent infection.  The stomach, small bowel, and colon are not abnormally distended and there is no significant wall thickening.  No free air or free fluid in the abdomen.  Pelvis:  Somewhat nodular enlargement of the uterus suggesting fibroids.  No abnormal adnexal masses.  The appendix is normal. Scattered diverticula in the sigmoid colon without diverticulitis. No free or loculated pelvic fluid collections.  There appears to be a small amount of gas in the bladder without bladder wall thickening.  Changes could be due to instrumentation or infection. Normal alignment of the lumbar vertebrae.  IMPRESSION: 3 mm moderately obstructing stone in the distal left ureter. Enhancement of the ureteral wall and small amount of gas in the bladder suggest possible infection.  No evidence of colitis.   Original Report Authenticated By: Marlon Pel, M.D.      Assessment/Plan Present on Admission:  .Nephrolithiasis .Hydronephrosis .UTI (lower urinary tract infection) .Lupus (systemic lupus erythematosus)  PLAN:  This patient has nephrolithiasis at the ureterovesicular jx and it is very small.  I hope she will pass the stone.  I have not ordered flomax as she has allergy to sulfa.  She also has been on chronic steroid, and will need IV solucortef.  I will increase her prednisone to 40mg  per day.  Please taper properly.  Lastly, she may have an infection as well, being immunocompromised, I will start her on antibiotics.  She has so many allergies, I will do Clinda and Gentamycin.  She also has some diarrhea, and C diff PCR was sent.  Will give her ample of IVF and screen her urine.  She is stable, full code, and will be admitted to [facility]  service.  Other plans as per orders.  Code Status: FULL Unk Lightning, MD. [facility] Pager [phone] 7pm to 7am.  04/05/2012, 6:15 AM

## 2012-04-06 DIAGNOSIS — IMO0002 Reserved for concepts with insufficient information to code with codable children: Secondary | ICD-10-CM

## 2012-04-06 LAB — BASIC METABOLIC PANEL
CO2: 21 mEq/L (ref 19–32)
Calcium: 8.2 mg/dL — ABNORMAL LOW (ref 8.4–10.5)
Chloride: 109 mEq/L (ref 96–112)
Creatinine, Ser: 0.96 mg/dL (ref 0.50–1.10)
Glucose, Bld: 125 mg/dL — ABNORMAL HIGH (ref 70–99)

## 2012-04-06 LAB — CBC
MCHC: 34.3 g/dL (ref 30.0–36.0)
Platelets: 346 10*3/uL (ref 150–400)
RDW: 14.9 % (ref 11.5–15.5)
WBC: 9 10*3/uL (ref 4.0–10.5)

## 2012-04-06 LAB — MAGNESIUM: Magnesium: 2 mg/dL (ref 1.5–2.5)

## 2012-04-06 LAB — URINE CULTURE
Colony Count: 85000
Colony Count: NO GROWTH
Culture: NO GROWTH

## 2012-04-06 LAB — GLUCOSE, CAPILLARY: Glucose-Capillary: 130 mg/dL — ABNORMAL HIGH (ref 70–99)

## 2012-04-06 MED ORDER — TAMSULOSIN HCL 0.4 MG PO CAPS
0.4000 mg | ORAL_CAPSULE | Freq: Every day | ORAL | Status: DC
  Filled 2012-04-06: qty 1

## 2012-04-06 MED ORDER — KETOROLAC TROMETHAMINE 10 MG PO TABS: 10.0000 mg | ORAL_TABLET | Freq: Four times a day (QID) | ORAL | Status: DC | PRN

## 2012-04-06 MED ORDER — METHOTREXATE 2.5 MG PO TABS
2.5000 mg | ORAL_TABLET | ORAL | Status: DC
  Filled 2012-04-06: qty 1

## 2012-04-06 MED ORDER — SODIUM CHLORIDE 0.9 % IV SOLN: INTRAVENOUS | Status: DC

## 2012-04-06 MED ORDER — CYCLOBENZAPRINE HCL 10 MG PO TABS: 10.0000 mg | ORAL_TABLET | Freq: Three times a day (TID) | ORAL | Status: DC | PRN

## 2012-04-06 MED ORDER — TAMSULOSIN HCL 0.4 MG PO CAPS: 0.4000 mg | ORAL_CAPSULE | Freq: Every day | ORAL | Status: DC

## 2012-04-06 NOTE — Progress Notes (Signed)
Pt was given discharge instructions, information about follow-up appts, and  prescriptions. Pt was given a strainer and urine hat to monitor urine output.  Pt was educated on when to call the MD if any changes occurred. Pt verbalized understanding of all discharge instructions and did not have any questions or concerns. Orson Ape D 04/06/2012

## 2012-04-06 NOTE — Discharge Summary (Signed)
[facility]                                                                                   [name], is a 46 y.o. female  DOB July 05, 1965  MRN [id number].  Admission date:  04/04/2012  Discharge Date:  04/06/2012  Primary MD  Dorrene German, MD  Admitting Physician  Houston Siren, MD  Admission Diagnosis  Hydronephrosis [591] UTI (lower urinary tract infection) [599.0] Lupus (systemic lupus erythematosus) [710.0] Chronic use of steroids [V58.65] Kidney stone on left side [592.0] abd pain  Discharge Diagnosis     Principal Problem:  *Nephrolithiasis Active Problems:  Hydronephrosis  UTI (lower urinary tract infection)  Chronic use of steroids  Lupus (systemic lupus erythematosus)    Past Medical History  Diagnosis Date  . Lupus   . Cancer     nasopharenx  . Diabetes mellitus without complication     related to prednisone use    History reviewed. No pertinent past surgical history.   Recommendations for primary care physician for things to follow:    Please follow patient's final urine cultures   Discharge Diagnoses:   Principal Problem:  *Nephrolithiasis Active Problems:  Hydronephrosis  UTI (lower urinary tract infection)  Chronic use of steroids  Lupus (systemic lupus erythematosus)    Discharge Condition: Stable   Diet recommendation: See Discharge Instructions below   Consults urologist Dr. Cassell Smiles   History of present illness and  Hospital Course:  See H&P, Labs, Consult and Test reports for all details in brief, patient was admitted for left-sided flank pain due to  3 mm left  ureteric stone causing hydronephrosis and obstruction, she was treated conservatively as per Dr. Rachael Darby suggestion, IV fluids, Flomax and urinary straining, she is relatively symptom-free this morning pain is almost resolved, I discussed the case with Dr. Cassell Smiles who suggested that patient can be discharged on Flomax and Toradol without  any antibiotics, he will see the patient soon in the next 3-4 days in his office and reevaluate at that time.   Patient has underlying lupus for which her home medication i.e. home dose steroid and methotrexate will be continued, she will follow with primary care physician in 2-3 days from that standpoint      Today   Subjective:   [name] today has no headache,no chest abdominal pain,no new weakness tingling or numbness, feels much better wants to go home today.   Objective:   Blood pressure 116/67, pulse 73, temperature 97.1 F (36.2 C), temperature source Oral, resp. rate 18, height 5\' 6"  (1.676 m), weight 91.808 kg (202 lb 6.4 oz), last menstrual period 01/04/2012, SpO2 100.00%.   Intake/Output Summary (Last 24 hours) at 04/06/12 1056 Last data filed at 04/06/12 0600  Gross per 24 hour  Intake 3203.34 ml  Output   1450 ml  Net 1753.34 ml    Exam Awake Alert, Oriented *3, No new F.N deficits, Normal affect [place].AT,PERRAL Supple Neck,No JVD, No cervical lymphadenopathy appriciated.  Symmetrical Chest wall movement, Good air movement bilaterally, CTAB RRR,No Gallops,Rubs or new Murmurs, No Parasternal Heave +ve B.Sounds, Abd Soft, Non tender, No organomegaly appriciated, No rebound -guarding  or rigidity. No Cyanosis, Clubbing or edema, No new Rash or bruise  Data Review   Major procedures and Radiology Reports - PLEASE review detailed and final reports for all details in brief -     Ct Abdomen Pelvis W Contrast  04/05/2012  *RADIOLOGY REPORT*  Clinical Data: Abdominal pain, nausea, vomiting, and diarrhea.  CT ABDOMEN AND PELVIS WITH CONTRAST  Technique:  Multidetector CT imaging of the abdomen and pelvis was performed following the standard protocol during bolus administration of intravenous contrast.  Contrast: 80mL OMNIPAQUE IOHEXOL 300 MG/ML  SOLN  Comparison: None.  Findings: Atelectasis in the lung bases, greater on the right.  The liver, spleen, gallbladder,  pancreas, adrenal glands, abdominal aorta, and retroperitoneal lymph nodes are unremarkable.  The right kidney appears normal.  There is a delayed nephrogram on the left kidney with left renal swelling and para renal and periureteral stranding.  There is pyelocaliectasis and ureterectasis of the left kidney.  There is a 3 mm stone in the distal left ureter at the ureterovesicle junction.  Changes are consistent with moderate obstruction.  There is enhancement of the wall of the ureter diffusely which might also represent infection.  The stomach, small bowel, and colon are not abnormally distended and there is no significant wall thickening.  No free air or free fluid in the abdomen.  Pelvis:  Somewhat nodular enlargement of the uterus suggesting fibroids.  No abnormal adnexal masses.  The appendix is normal. Scattered diverticula in the sigmoid colon without diverticulitis. No free or loculated pelvic fluid collections.  There appears to be a small amount of gas in the bladder without bladder wall thickening.  Changes could be due to instrumentation or infection. Normal alignment of the lumbar vertebrae.  IMPRESSION: 3 mm moderately obstructing stone in the distal left ureter. Enhancement of the ureteral wall and small amount of gas in the bladder suggest possible infection.  No evidence of colitis.   Original Report Authenticated By: Marlon Pel, M.D.     Micro Results      Recent Results (from the past 240 hour(s))  URINE CULTURE     Status: Normal   Collection Time   04/05/12 12:22 AM      Component Value Range Status Comment   Specimen Description URINE, CLEAN CATCH   Final    Special Requests CX ADDED AT 0333   Final    Culture  Setup Time 04/05/2012 03:41   Final    Colony Count 85,000 COLONIES/ML   Final    Culture     Final    Value: Multiple bacterial morphotypes present, none predominant. Suggest appropriate recollection if clinically indicated.   Report Status 04/06/2012 FINAL    Final   URINE CULTURE     Status: Normal   Collection Time   04/05/12  4:18 AM      Component Value Range Status Comment   Specimen Description URINE, CATHETERIZED   Final    Special Requests NONE   Final    Culture  Setup Time 04/05/2012 04:31   Final    Colony Count NO GROWTH   Final    Culture NO GROWTH   Final    Report Status 04/06/2012 FINAL   Final   CLOSTRIDIUM DIFFICILE BY PCR     Status: Normal   Collection Time   04/05/12  6:30 AM      Component Value Range Status Comment   C difficile by pcr NEGATIVE  NEGATIVE Final  CBC w Diff: Lab Results  Component Value Date   WBC 9.0 04/06/2012   WBC 11.0* 03/25/2009   HGB 11.6* 04/06/2012   HGB 10.6* 03/25/2009   HCT 33.8* 04/06/2012   HCT 30.9* 03/25/2009   PLT 346 04/06/2012   PLT 355 03/25/2009   LYMPHOPCT 14 04/04/2012   LYMPHOPCT 15.0 03/25/2009   MONOPCT 6 04/04/2012   MONOPCT 4.5 03/25/2009   EOSPCT 1 04/04/2012   EOSPCT 0.1 03/25/2009   BASOPCT 0 04/04/2012   BASOPCT 0.3 03/25/2009    CMP: Lab Results  Component Value Date   NA 140 04/06/2012   K 4.0 04/06/2012   CL 109 04/06/2012   CO2 21 04/06/2012   BUN 11 04/06/2012   CREATININE 0.96 04/06/2012   PROT 9.0* 04/04/2012   ALBUMIN 4.0 04/04/2012   BILITOT 0.4 04/04/2012   ALKPHOS 67 04/04/2012   AST 20 04/04/2012   ALT 9 04/04/2012  .   Discharge Instructions     Follow with Primary MD Dorrene German, MD in 3 days    Follow with Dr. Patsi Sears in 2-3 days follow final urine cultures with him.    Get CBC, CMP, checked 3 days by Primary MD and again as instructed by your Primary MD.    Get Medicines reviewed and adjusted.  Please request your Prim.MD to go over all Hospital Tests and Procedure/Radiological results at the follow up, please get all Hospital records sent to your Prim MD by signing hospital release before you go home.  Activity: As tolerated with Full fall precautions use Foree/cane & assistance as needed   Diet:  Heart  healthy diet, drink plenty of fluids and continue to strain urine.   Disposition Home   If you experience worsening of your admission symptoms, develop shortness of breath, life threatening emergency, suicidal or homicidal thoughts you must seek medical attention immediately by calling 911 or calling your MD immediately  if symptoms less severe.  You Must read complete instructions/literature along with all the possible adverse reactions/side effects for all the Medicines you take and that have been prescribed to you. Take any new Medicines after you have completely understood and accpet all the possible adverse reactions/side effects.   Do not drive and provide baby sitting services if your were admitted for syncope or siezures until you have seen by Primary MD or a Neurologist and advised to do so again.  Do not drive when taking Pain medications.    Do not take more than prescribed Pain, Sleep and Anxiety Medications  Special Instructions: If you have smoked or chewed Tobacco  in the last 2 yrs please stop smoking, stop any regular Alcohol  and or any Recreational drug use.  Wear Seat belts while driving.   Kidney Stones Kidney stones (ureteral lithiasis) are deposits that form inside your kidneys. The intense pain is caused by the stone moving through the urinary tract. When the stone moves, the ureter goes into spasm around the stone. The stone is usually passed in the urine.  CAUSES   A disorder that makes certain neck glands produce too much parathyroid hormone (primary hyperparathyroidism).  A buildup of uric acid crystals.  Narrowing (stricture) of the ureter.  A kidney obstruction present at birth (congenital obstruction).  Previous surgery on the kidney or ureters.  Numerous kidney infections. SYMPTOMS   Feeling sick to your stomach (nauseous).  Throwing up (vomiting).  Blood in the urine (hematuria).  Pain that usually spreads (radiates) to the  groin.  Frequency or urgency of urination. DIAGNOSIS   Taking a history and physical exam.  Blood or urine tests.  Computerized X-ray scan (CT scan).  Occasionally, an examination of the inside of the urinary bladder (cystoscopy) is performed. TREATMENT   Observation.  Increasing your fluid intake.  Surgery may be needed if you have severe pain or persistent obstruction. The size, location, and chemical composition are all important variables that will determine the proper choice of action for you. Talk to your caregiver to better understand your situation so that you will minimize the risk of injury to yourself and your kidney.  HOME CARE INSTRUCTIONS   Drink enough water and fluids to keep your urine clear or pale yellow.  Strain all urine through the provided strainer. Keep all particulate matter and stones for your caregiver to see. The stone causing the pain may be as small as a grain of salt. It is very important to use the strainer each and every time you pass your urine. The collection of your stone will allow your caregiver to analyze it and verify that a stone has actually passed.  Only take over-the-counter or prescription medicines for pain, discomfort, or fever as directed by your caregiver.  Make a follow-up appointment with your caregiver as directed.  Get follow-up X-rays if required. The absence of pain does not always mean that the stone has passed. It may have only stopped moving. If the urine remains completely obstructed, it can cause loss of kidney function or even complete destruction of the kidney. It is your responsibility to make sure X-rays and follow-ups are completed. Ultrasounds of the kidney can show blockages and the status of the kidney. Ultrasounds are not associated with any radiation and can be performed easily in a matter of minutes. SEEK IMMEDIATE MEDICAL CARE IF:   Pain cannot be controlled with the prescribed medicine.  You have a  fever.  The severity or intensity of pain increases over 18 hours and is not relieved by pain medicine.  You develop a new onset of abdominal pain.  You feel faint or pass out. MAKE SURE YOU:   Understand these instructions.  Will watch your condition.  Will get help right away if you are not doing well or get worse. Document Released: 06/07/2005 Document Revised: 08/30/2011 Document Reviewed: 10/03/2009 [facility] Patient Information 2013 [place], [place].   Follow-up Information    Follow up with AVBUERE,EDWIN A, MD. Schedule an appointment as soon as possible for a visit in 2 days.   Contact information:   [address] [place] [address] [phone]       Follow up with Jethro Bolus I, MD. Schedule an appointment as soon as possible for a visit in 2 days.   Contact information:   [address] [facility] [place] [address] [phone]            Discharge Medications     Medication List     As of 04/06/2012 10:56 AM    START taking these medications         ketorolac 10 MG tablet   Commonly known as: TORADOL   Take 1 tablet (10 mg total) by mouth every 6 (six) hours as needed for pain.      Tamsulosin HCl 0.4 MG Caps   Commonly known as: FLOMAX   Take 1 capsule (0.4 mg total) by mouth daily.      CONTINUE taking these medications  albuterol 108 (90 BASE) MCG/ACT inhaler   Commonly known as: PROVENTIL HFA;VENTOLIN HFA      celecoxib 200 MG capsule   Commonly known as: CELEBREX      cyclobenzaprine 10 MG tablet   Commonly known as: FLEXERIL      folic acid 1 MG tablet   Commonly known as: FOLVITE      gabapentin 100 MG capsule   Commonly known as: NEURONTIN      levothyroxine 75 MCG tablet   Commonly known as: SYNTHROID, LEVOTHROID      LORazepam 1 MG tablet   Commonly known as: ATIVAN      methotrexate 2.5 MG tablet   Commonly known as: RHEUMATREX      multivitamin with  minerals Tabs      pantoprazole 40 MG tablet   Commonly known as: PROTONIX      predniSONE 5 MG tablet   Commonly known as: DELTASONE      traMADol 50 MG tablet   Commonly known as: ULTRAM      VITAMIN E PO          Where to get your medications    These are the prescriptions that you need to pick up.   You may get these medications from any pharmacy.         ketorolac 10 MG tablet   Tamsulosin HCl 0.4 MG Caps               Total Time in preparing paper work, data evaluation and todays exam - 35 minutes  Leroy Sea M.D on 04/06/2012 at 10:56 AM  [facility] Office  [phone]

## 2012-04-06 NOTE — Progress Notes (Signed)
[facility]                                                                                Patient Demographics  [name], is a 46 y.o. female  JWJ:[id number]  AOZ:[id number]  DOB - Mar 28, 1966  Admit date - 04/04/2012  Admitting Physician Houston Siren, MD  Outpatient Primary MD for the patient is Dorrene German, MD  LOS - 2   Chief Complaint  Patient presents with  . Abdominal Pain        Assessment & Plan    1. L.Uretric Nephrolithiasis with  Hydronephrosis & likley  UTI (lower urinary tract infection) - continue IVF, Ur straining, BMP stable, urology following.   2. H/O Lupus continue to present dose  Steroids & Methotrexate.   3.Steroid Induced Hyperglycemia - ISS.  CBG (last 3)   Basename 04/06/12 0747 04/05/12 2135 04/05/12 1702  GLUCAP 130* 161* 159*     Code Status: Full  Family Communication: Discussed with the patient  Disposition Plan: Home    Procedures None   Consults  Urology Dr Venetia Constable   Time Spent in minutes  35   Antibiotics    Anti-infectives     Start     Dose/Rate Route Frequency Ordered Stop   04/06/12 1900   gentamicin (GARAMYCIN) 500 mg in dextrose 5 % 100 mL IVPB        500 mg 112.5 mL/hr over 60 Minutes Intravenous Every 36 hours 04/05/12 2131     04/05/12 0630   gentamicin (GARAMYCIN) 500 mg in dextrose 5 % 100 mL IVPB  Status:  Discontinued        500 mg 112.5 mL/hr over 60 Minutes Intravenous Every 24 hours 04/05/12 0548 04/05/12 2131   04/05/12 0600   clindamycin (CLEOCIN) IVPB 600 mg        600 mg 100 mL/hr over 30 Minutes Intravenous 3 times per day 04/05/12 0454            Scheduled Meds:   . clindamycin (CLEOCIN) IV  600 mg Intravenous Q8H  . docusate sodium  100 mg Oral BID  . folic acid  1 mg Oral Daily  . gabapentin  100 mg Oral BID  . gentamicin  500 mg Intravenous Q36H  . heparin  5,000 Units Subcutaneous Q8H  . hydrocortisone sod succinate (SOLU-CORTEF) injection  50  mg Intravenous Q8H  . hydrOXYzine  10 mg Oral BID WC  . insulin aspart  0-20 Units Subcutaneous TID WC  . insulin aspart  0-5 Units Subcutaneous QHS  . ketorolac  15 mg Intravenous Q6H  . levothyroxine  75 mcg Oral QAC breakfast  . LORazepam  1 mg Oral Daily  . methotrexate  2.5 mg Oral Weekly  . multivitamin with minerals  1 tablet Oral Daily  . pantoprazole  40 mg Oral Daily  . potassium chloride  40 mEq Oral BID  . predniSONE  40 mg Oral QAC breakfast  . QUEtiapine  100 mg Oral QHS  . traMADol  50 mg Oral BID  . DISCONTD: gentamicin  500 mg Intravenous Q24H  . DISCONTD: methotrexate  2.5 mg Oral Weekly   Continuous Infusions:   .  sodium chloride 125 mL/hr at 04/05/12 2108  . DISCONTD: sodium chloride 1,000 mL (04/05/12 0157)  . DISCONTD: sodium chloride 150 mL/hr at 04/05/12 0715   PRN Meds:.albuterol, cyclobenzaprine, HYDROmorphone (DILAUDID) injection, ondansetron (ZOFRAN) IV, ondansetron, DISCONTD:  HYDROmorphone (DILAUDID) injection   DVT Prophylaxis  Heparin - SCDs    Lab Results  Component Value Date   PLT 346 04/06/2012      Susa Raring K M.D on 04/06/2012 at 10:09 AM  Between 7am to 7pm - Pager - [phone]  After 7pm go to [email or web address] - password [email or web address]  And look for the night coverage person covering for me after hours  [facility] Office  [phone]    Subjective:   [name] today has, No headache, No chest pain, No abdominal pain - No Nausea, No new weakness tingling or numbness, No Cough - SOB.Mild L Flank pain.     Objective:   Filed Vitals:   04/05/12 0500 04/05/12 1423 04/05/12 2134 04/06/12 0619  BP: 128/73 123/74 135/77 116/67  Pulse: 80 79 78 73  Temp: 98.2 F (36.8 C) 97.6 F (36.4 C) 97.5 F (36.4 C) 97.1 F (36.2 C)  TempSrc: Oral Oral Oral Oral  Resp: 19 18 20 18   Height: 5\' 6"  (1.676 m)     Weight: 91.808 kg (202 lb 6.4 oz)     SpO2: 100% 100% 100% 100%    Wt Readings from Last 3 Encounters:    04/05/12 91.808 kg (202 lb 6.4 oz)     Intake/Output Summary (Last 24 hours) at 04/06/12 1009 Last data filed at 04/06/12 0600  Gross per 24 hour  Intake 3323.34 ml  Output   1450 ml  Net 1873.34 ml    Exam Awake Alert, Oriented X 3, No new F.N deficits, Normal affect .AT,PERRAL Supple Neck,No JVD, No cervical lymphadenopathy appriciated.  Symmetrical Chest wall movement, Good air movement bilaterally, CTAB RRR,No Gallops,Rubs or new Murmurs, No Parasternal Heave +ve B.Sounds, Abd Soft, Non tender, No organomegaly appriciated, No rebound - guarding or rigidity. No Cyanosis, Clubbing or edema, No new Rash or bruise      Data Review   Micro Results Recent Results (from the past 240 hour(s))  URINE CULTURE     Status: Normal   Collection Time   04/05/12 12:22 AM      Component Value Range Status Comment   Specimen Description URINE, CLEAN CATCH   Final    Special Requests CX ADDED AT 0333   Final    Culture  Setup Time 04/05/2012 03:41   Final    Colony Count 85,000 COLONIES/ML   Final    Culture     Final    Value: Multiple bacterial morphotypes present, none predominant. Suggest appropriate recollection if clinically indicated.   Report Status 04/06/2012 FINAL   Final   URINE CULTURE     Status: Normal   Collection Time   04/05/12  4:18 AM      Component Value Range Status Comment   Specimen Description URINE, CATHETERIZED   Final    Special Requests NONE   Final    Culture  Setup Time 04/05/2012 04:31   Final    Colony Count NO GROWTH   Final    Culture NO GROWTH   Final    Report Status 04/06/2012 FINAL   Final   CLOSTRIDIUM DIFFICILE BY PCR     Status: Normal   Collection Time   04/05/12  6:30 AM      Component  Value Range Status Comment   C difficile by pcr NEGATIVE  NEGATIVE Final     Radiology Reports Ct Abdomen Pelvis W Contrast  04/05/2012  *RADIOLOGY REPORT*  Clinical Data: Abdominal pain, nausea, vomiting, and diarrhea.  CT ABDOMEN AND PELVIS WITH  CONTRAST  Technique:  Multidetector CT imaging of the abdomen and pelvis was performed following the standard protocol during bolus administration of intravenous contrast.  Contrast: 80mL OMNIPAQUE IOHEXOL 300 MG/ML  SOLN  Comparison: None.  Findings: Atelectasis in the lung bases, greater on the right.  The liver, spleen, gallbladder, pancreas, adrenal glands, abdominal aorta, and retroperitoneal lymph nodes are unremarkable.  The right kidney appears normal.  There is a delayed nephrogram on the left kidney with left renal swelling and para renal and periureteral stranding.  There is pyelocaliectasis and ureterectasis of the left kidney.  There is a 3 mm stone in the distal left ureter at the ureterovesicle junction.  Changes are consistent with moderate obstruction.  There is enhancement of the wall of the ureter diffusely which might also represent infection.  The stomach, small bowel, and colon are not abnormally distended and there is no significant wall thickening.  No free air or free fluid in the abdomen.  Pelvis:  Somewhat nodular enlargement of the uterus suggesting fibroids.  No abnormal adnexal masses.  The appendix is normal. Scattered diverticula in the sigmoid colon without diverticulitis. No free or loculated pelvic fluid collections.  There appears to be a small amount of gas in the bladder without bladder wall thickening.  Changes could be due to instrumentation or infection. Normal alignment of the lumbar vertebrae.  IMPRESSION: 3 mm moderately obstructing stone in the distal left ureter. Enhancement of the ureteral wall and small amount of gas in the bladder suggest possible infection.  No evidence of colitis.   Original Report Authenticated By: Marlon Pel, M.D.     CBC  Lab 04/06/12 0620 04/05/12 0625 04/04/12 2249  WBC 9.0 11.8* 15.8*  HGB 11.6* 12.9 15.1*  HCT 33.8* 37.9 43.3  PLT 346 387 449*  MCV 91.8 90.9 91.0  MCH 31.5 30.9 31.7  MCHC 34.3 34.0 34.9  RDW 14.9 14.7  14.7  LYMPHSABS -- -- 2.3  MONOABS -- -- 1.0  EOSABS -- -- 0.1  BASOSABS -- -- 0.0  BANDABS -- -- --    Chemistries   Lab 04/06/12 0831 04/05/12 0852 04/05/12 0625 04/04/12 2249  NA 140 -- -- 138  K 4.0 3.0* -- 3.3*  CL 109 -- -- 104  CO2 21 -- -- 19  GLUCOSE 125* -- -- 110*  BUN 11 -- -- 13  CREATININE 0.96 -- 1.41* 1.77*  CALCIUM 8.2* -- -- 9.9  MG 2.0 -- -- --  AST -- -- -- 20  ALT -- -- -- 9  ALKPHOS -- -- -- 67  BILITOT -- -- -- 0.4   ------------------------------------------------------------------------------------------------------------------ estimated creatinine clearance is 82.7 ml/min (by C-G formula based on Cr of 0.96). ------------------------------------------------------------------------------------------------------------------ No results found for this basename: HGBA1C:2 in the last 72 hours ------------------------------------------------------------------------------------------------------------------ No results found for this basename: CHOL:2,HDL:2,LDLCALC:2,TRIG:2,CHOLHDL:2,LDLDIRECT:2 in the last 72 hours ------------------------------------------------------------------------------------------------------------------  [facility] 04/05/12 0625  TSH 8.576*  T4TOTAL --  T3FREE --  THYROIDAB --   ------------------------------------------------------------------------------------------------------------------ No results found for this basename: VITAMINB12:2,FOLATE:2,FERRITIN:2,TIBC:2,IRON:2,RETICCTPCT:2 in the last 72 hours  Coagulation profile No results found for this basename: INR:5,PROTIME:5 in the last 168 hours  No results found for this basename: DDIMER:2 in the last 72 hours  Cardiac  Enzymes No results found for this basename: CK:3,CKMB:3,TROPONINI:3,MYOGLOBIN:3 in the last 168 hours ------------------------------------------------------------------------------------------------------------------ No components found with this basename:  POCBNP:3

## 2012-06-21 DIAGNOSIS — Z87442 Personal history of urinary calculi: Secondary | ICD-10-CM

## 2012-06-21 HISTORY — DX: Personal history of urinary calculi: Z87.442

## 2012-09-06 ENCOUNTER — Ambulatory Visit: Payer: Self-pay | Admitting: Obstetrics & Gynecology

## 2012-09-22 ENCOUNTER — Encounter: Payer: Self-pay | Admitting: *Deleted

## 2012-09-25 ENCOUNTER — Ambulatory Visit: Payer: Self-pay | Admitting: Obstetrics & Gynecology

## 2012-12-28 ENCOUNTER — Ambulatory Visit: Payer: PRIVATE HEALTH INSURANCE | Admitting: Obstetrics & Gynecology

## 2013-01-11 ENCOUNTER — Telehealth: Payer: Self-pay | Admitting: Radiation Oncology

## 2013-01-11 NOTE — Telephone Encounter (Signed)
Dot Lanes of Dr. Rayfield Citizen office phoned requesting records for this patient because of impending jaw surgery. Routed this request to [facility]. Dot Lanes requested records be faxed to [phone].

## 2013-01-12 ENCOUNTER — Telehealth: Payer: Self-pay | Admitting: Radiation Oncology

## 2013-01-12 NOTE — Telephone Encounter (Signed)
Faxed NPE 10/12/04, EOT 01/05/05, FUP 09/24/09 to Dr. Lincoln Brigham, fax [phone].  OK per MAM.  Received confirmation.

## 2013-01-18 ENCOUNTER — Ambulatory Visit
Admission: RE | Admit: 2013-01-18 | Discharge: 2013-01-18 | Disposition: A | Discharge status: Home / Self Care | Payer: PRIVATE HEALTH INSURANCE | Source: Ambulatory Visit | Attending: Internal Medicine | Admitting: Internal Medicine

## 2013-01-18 ENCOUNTER — Other Ambulatory Visit: Payer: Self-pay | Admitting: Internal Medicine

## 2013-01-18 DIAGNOSIS — M5430 Sciatica, unspecified side: Secondary | ICD-10-CM

## 2013-02-15 ENCOUNTER — Ambulatory Visit: Payer: PRIVATE HEALTH INSURANCE | Admitting: Obstetrics & Gynecology

## 2013-02-26 ENCOUNTER — Encounter (HOSPITAL_COMMUNITY): Payer: Self-pay | Admitting: Respiratory Therapy

## 2013-02-27 ENCOUNTER — Encounter (HOSPITAL_COMMUNITY): Payer: Self-pay

## 2013-02-27 ENCOUNTER — Encounter (HOSPITAL_COMMUNITY)
Admission: RE | Admit: 2013-02-27 | Discharge: 2013-02-27 | Disposition: A | Discharge status: Home / Self Care | Payer: PRIVATE HEALTH INSURANCE | Source: Ambulatory Visit | Attending: Nephrology | Admitting: Nephrology

## 2013-02-27 DIAGNOSIS — Z01818 Encounter for other preprocedural examination: Secondary | ICD-10-CM | POA: Insufficient documentation

## 2013-02-27 DIAGNOSIS — Z01812 Encounter for preprocedural laboratory examination: Secondary | ICD-10-CM | POA: Insufficient documentation

## 2013-02-27 DIAGNOSIS — Z01810 Encounter for preprocedural cardiovascular examination: Secondary | ICD-10-CM | POA: Insufficient documentation

## 2013-02-27 HISTORY — DX: Unspecified osteoarthritis, unspecified site: M19.90

## 2013-02-27 HISTORY — DX: Headache: R51

## 2013-02-27 HISTORY — DX: Hypothyroidism, unspecified: E03.9

## 2013-02-27 HISTORY — DX: Anxiety disorder, unspecified: F41.9

## 2013-02-27 HISTORY — DX: Gastro-esophageal reflux disease without esophagitis: K21.9

## 2013-02-27 LAB — CBC
Hemoglobin: 12.2 g/dL (ref 12.0–15.0)
Platelets: 339 10*3/uL (ref 150–400)
RBC: 3.71 MIL/uL — ABNORMAL LOW (ref 3.87–5.11)
WBC: 8.6 10*3/uL (ref 4.0–10.5)

## 2013-02-27 LAB — BASIC METABOLIC PANEL
Calcium: 8.6 mg/dL (ref 8.4–10.5)
GFR calc non Af Amer: 64 mL/min — ABNORMAL LOW (ref >90)
Potassium: 3.3 mEq/L — ABNORMAL LOW (ref 3.5–5.1)
Sodium: 141 mEq/L (ref 135–145)

## 2013-02-27 LAB — HCG, SERUM, QUALITATIVE: Preg, Serum: NEGATIVE

## 2013-02-27 NOTE — Pre-Procedure Instructions (Signed)
[name]  02/27/2013   Your procedure is scheduled on:  03/05/13  Report to [facility] at 530 AM.  Call this number if you have problems the morning of surgery: [phone]   Remember:   Do not eat food or drink liquids after midnight.   Take these medicines the morning of surgery with A SIP OF WATER: all inhalers,neurontin,hydrocodone, Synthroid,protonix,flomax,topamax  Do not wear jewelry, make-up or nail polish.  Do not wear lotions, powders, or perfumes. You may wear deodorant.  Do not shave 48 hours prior to surgery. Men may shave face and neck.  Do not bring valuables to the hospital.  [facility] is not responsible                   for any belongings or valuables.  Contacts, dentures or bridgework may not be worn into surgery.  Leave suitcase in the car. After surgery it may be brought to your room.  For patients admitted to the hospital, checkout time is 11:00 AM the day of  discharge.   Patients discharged the day of surgery will not be allowed to drive  home.  Name and phone number of your driver: family  Special Instructions: Incentive Spirometry - Practice and bring it with you on the day of surgery.   Please read over the following fact sheets that you were given: Pain Booklet, Coughing and Deep Breathing and Surgical Site Infection Prevention

## 2013-03-04 DIAGNOSIS — K029 Dental caries, unspecified: Secondary | ICD-10-CM

## 2013-03-04 HISTORY — DX: Dental caries, unspecified: K02.9

## 2013-03-04 NOTE — H&P (Signed)
[name] is an 47 y.o. female.   Chief Complaint: "[My Teeth] have been hurting for a really long time."  HPI: [name] is a 47 year old female that is requiring all of her remaining teeth be removed due to poor dentition and caries.  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  Although, the radiation was fractionated she does still have some risk of osteoradionecrosis status post extraction.  She also has been treated with IV bisphosphonates and therefore also has a risk for bisphosphonate related osteonecrosis of the jaw.   Due to her multiple risk factors for osteonecrosis the patient and I decided to remove a few teeth with local anesthetic in office to evaluate her healing potential.  Teeth #'s 27, 28, 29, 30 were removed and the patient followed up two weeks status post extraction with excellent healing.  It was deemed appropriate that the patient have the remaining carious teeth removed.    PMHx:  Past Medical History  Diagnosis Date  . Lupus   . Cancer     nasopharenx  . 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 Irregular menstrual cycle   . Papanicolaou smear of cervix with atypical squamous cells of undetermined significance (ASC-US)   . Excessive or frequent menstruation   . Leiomyoma of uterus, unspecified   . Trichimoniasis   . Psoriasis   . Headache(784.0)   . GERD (gastroesophageal reflux disease)   . Arthritis     ra, lupus  . Anxiety   . Hypothyroidism     PSx:  Past Surgical History  Procedure Laterality Date  . Ankle arthroscopy    . Cesarean section    . Lymph node biopsy      neck    Family Hx:  Family History  Problem Relation Age of Onset  . Lupus Brother     Social History:  reports that she quit smoking about 9 years ago. She has never used smokeless tobacco. She reports that she does not drink alcohol or use illicit drugs.  Allergies:   Allergies  Allergen Reactions  . Amoxicillin Itching  . Latex Itching  . Plaquenil [Hydroxychloroquine Sulfate]   . Bactrim [Sulfamethoxazole W-Trimethoprim] Rash  . Ciprofloxacin Hcl Rash  . Keflex [Cephalexin] Rash  . Penicillins Rash  . Tetracyclines & Related Rash    Meds:  No prescriptions prior to admission    Labs: No results found for this or any previous visit (from the past 48 hour(s)).  Radiology: No results found. In office [facility] Radiograph: non-restorable, caries associated with #1, 2, 3, 4, 5, 18, 19, 20, 21, 22, 23, 24, 25, 26, 27, 28, 29, and 30.  ROS: Pertinent items are noted in HPI.  Vitals: There were no vitals taken for this visit.  Physical Exam: General appearance: alert and cooperative Head: Normocephalic, without obvious abnormality, atraumatic Eyes: conjunctivae/corneas clear. PERRL, EOM's intact. Fundi benign. Ears: normal TM's and external ear canals both ears Nose: Nares normal. Septum midline. Mucosa normal. No drainage or sinus tenderness. Throat: abnormal findings: dentition: poor and gingivitis Resp: clear to auscultation bilaterally Cardio: regular rate and rhythm, S1, S2 normal, no murmur, click, rub or gallop GI: soft, non-tender; bowel sounds normal; no masses,  no organomegaly Extremities: extremities normal, atraumatic, no cyanosis or edema and psoriasis  Skin: psoriatic patches Lymph nodes: Cervical, supraclavicular, and axillary nodes normal. TMJ has a normal range of  motion.  The floor of the mouth is soft and non-raised. The uvula is midline without palatal draping. The previous extraction (#27, 28, 29, 30) sites have healed well and there is no evidence of osteonecrosis at those sites.  The patient has non-restorable carious dentition #1, 2, 3, 4, 5; 18, 19, 20, 21, 22, 23, 24, 25, 26.   Assessment/Plan [name] has carious and non-restorable teeth #1, 2, 3, 4, 5 as well as #18, 19, 20, 21, 22, 23, 24, 25, 26.  1.There is  no evidence of delayed healing or osteonecrosis at sites #27, 28, 29, 30 from a few weeks ago and we can proceed with the extraction of the remaining non-restorable teeth.    2.The plan is to take [name] to the OR for extraction of the remaining carious teeth #1, 2, 3, 4, 5 as well as #18, 19, 20, 21, 22, 23, 24, 25, 26.  Additionally, alveoloplasty in the right maxilla and bilateral mandible will be performed.    3. She will then follow up with her general dentist after 6 weeks for fabrication of her partial denture in the maxilla and her complete denture on the mandible.     [place],Phillp Dolores L  03/04/2013, 8:07 PM

## 2013-03-05 ENCOUNTER — Ambulatory Visit (HOSPITAL_COMMUNITY)
Admission: RE | Admit: 2013-03-05 | Discharge: 2013-03-05 | Disposition: A | Discharge status: Home / Self Care | Payer: PRIVATE HEALTH INSURANCE | Source: Ambulatory Visit | Attending: Oral and Maxillofacial Surgery | Admitting: Oral and Maxillofacial Surgery

## 2013-03-05 ENCOUNTER — Encounter (HOSPITAL_COMMUNITY): Payer: Self-pay | Admitting: Anesthesiology

## 2013-03-05 ENCOUNTER — Encounter (HOSPITAL_COMMUNITY)
Admission: RE | Disposition: A | Discharge status: Home / Self Care | Payer: Self-pay | Source: Ambulatory Visit | Attending: Oral and Maxillofacial Surgery

## 2013-03-05 ENCOUNTER — Encounter (HOSPITAL_COMMUNITY): Payer: Self-pay | Admitting: *Deleted

## 2013-03-05 ENCOUNTER — Ambulatory Visit (HOSPITAL_COMMUNITY): Payer: PRIVATE HEALTH INSURANCE | Admitting: Anesthesiology

## 2013-03-05 DIAGNOSIS — E039 Hypothyroidism, unspecified: Secondary | ICD-10-CM | POA: Insufficient documentation

## 2013-03-05 DIAGNOSIS — K029 Dental caries, unspecified: Secondary | ICD-10-CM | POA: Diagnosis present

## 2013-03-05 DIAGNOSIS — Z85819 Personal history of malignant neoplasm of unspecified site of lip, oral cavity, and pharynx: Secondary | ICD-10-CM | POA: Insufficient documentation

## 2013-03-05 DIAGNOSIS — K083 Retained dental root: Secondary | ICD-10-CM | POA: Insufficient documentation

## 2013-03-05 DIAGNOSIS — M329 Systemic lupus erythematosus, unspecified: Secondary | ICD-10-CM | POA: Insufficient documentation

## 2013-03-05 DIAGNOSIS — E119 Type 2 diabetes mellitus without complications: Secondary | ICD-10-CM | POA: Insufficient documentation

## 2013-03-05 HISTORY — PX: MULTIPLE EXTRACTIONS WITH ALVEOLOPLASTY: SHX5342

## 2013-03-05 HISTORY — PX: TOOTH EXTRACTION: SHX859

## 2013-03-05 LAB — GLUCOSE, CAPILLARY: Glucose-Capillary: 122 mg/dL — ABNORMAL HIGH (ref 70–99)

## 2013-03-05 SURGERY — MULTIPLE EXTRACTION WITH ALVEOLOPLASTY
Anesthesia: General | Site: Mouth | Wound class: Clean Contaminated

## 2013-03-05 MED ORDER — PROPOFOL 10 MG/ML IV BOLUS
INTRAVENOUS | Status: DC | PRN
  Administered 2013-03-05: 50 mg via INTRAVENOUS

## 2013-03-05 MED ORDER — LIDOCAINE-EPINEPHRINE 2 %-1:100000 IJ SOLN
INTRAMUSCULAR | Status: AC
  Filled 2013-03-05: qty 20.4

## 2013-03-05 MED ORDER — HYDROMORPHONE HCL PF 1 MG/ML IJ SOLN
INTRAMUSCULAR | Status: AC
  Filled 2013-03-05: qty 1

## 2013-03-05 MED ORDER — BUPIVACAINE-EPINEPHRINE PF 0.5-1:200000 % IJ SOLN
INTRAMUSCULAR | Status: AC
  Filled 2013-03-05: qty 18

## 2013-03-05 MED ORDER — HYDROMORPHONE HCL PF 1 MG/ML IJ SOLN
0.2500 mg | INTRAMUSCULAR | Status: DC | PRN
  Administered 2013-03-05 (×2): 0.5 mg via INTRAVENOUS

## 2013-03-05 MED ORDER — BUPIVACAINE-EPINEPHRINE 0.5% -1:200000 IJ SOLN
INTRAMUSCULAR | Status: DC | PRN
  Administered 2013-03-05: 7.2 mL

## 2013-03-05 MED ORDER — CLINDAMYCIN PHOSPHATE 600 MG/50ML IV SOLN
INTRAVENOUS | Status: AC
  Administered 2013-03-05: 600 mg via INTRAVENOUS
  Filled 2013-03-05: qty 50

## 2013-03-05 MED ORDER — ISOPROPYL ALCOHOL 70 % SOLN
Status: DC | PRN
  Administered 2013-03-05: 1 via TOPICAL

## 2013-03-05 MED ORDER — LIDOCAINE-EPINEPHRINE 2 %-1:100000 IJ SOLN
INTRAMUSCULAR | Status: DC | PRN
  Administered 2013-03-05: 1.7 mL

## 2013-03-05 MED ORDER — LACTATED RINGERS IV SOLN
INTRAVENOUS | Status: DC | PRN
  Administered 2013-03-05: 08:00:00 via INTRAVENOUS

## 2013-03-05 MED ORDER — 0.9 % SODIUM CHLORIDE (POUR BTL) OPTIME
TOPICAL | Status: DC | PRN
  Administered 2013-03-05: 1000 mL

## 2013-03-05 MED ORDER — MIDAZOLAM HCL 5 MG/5ML IJ SOLN
INTRAMUSCULAR | Status: DC | PRN
  Administered 2013-03-05 (×2): 2 mg via INTRAVENOUS

## 2013-03-05 MED ORDER — FENTANYL CITRATE 0.05 MG/ML IJ SOLN
INTRAMUSCULAR | Status: DC | PRN
  Administered 2013-03-05 (×5): 50 ug via INTRAVENOUS

## 2013-03-05 MED ORDER — CLINDAMYCIN PHOSPHATE 600 MG/50ML IV SOLN
600.0000 mg | Freq: Once | INTRAVENOUS | Status: DC
  Filled 2013-03-05: qty 50

## 2013-03-05 MED ORDER — PROPOFOL INFUSION 10 MG/ML OPTIME
INTRAVENOUS | Status: DC | PRN
  Administered 2013-03-05: 100 ug/kg/min via INTRAVENOUS

## 2013-03-05 SURGICAL SUPPLY — 64 items
ALCOHOL ISOPROPYL (RUBBING) (MISCELLANEOUS) ×3 IMPLANT
ATTRACTOMAT 16X20 MAGNETIC DRP (DRAPES) ×3 IMPLANT
BLADE SURG 15 STRL LF DISP TIS (BLADE) ×4 IMPLANT
BLADE SURG 15 STRL SS (BLADE) ×6
BUR CROSS CUT (BURR) ×3
BUR CROSS CUT FISSURE 1.6 (BURR) IMPLANT
BUR EGG ELITE 4.0 (BURR) ×1 IMPLANT
BUR RND FLUTED 2.5 (BURR) IMPLANT
BUR SRG MED 1.2XXCUT FSSR (BURR) IMPLANT
BUR SRG MED 1.6XXCUT FSSR (BURR) IMPLANT
BUR SRG MED 2.1XXCUT FSSR (BURR) IMPLANT
BUR STRYKR 2.5 FLUT MED (BURR) IMPLANT
BUR SURG 4X8 MED (BURR) IMPLANT
BURR SRG MED 1.2XXCUT FSSR (BURR)
BURR SRG MED 1.6XXCUT FSSR (BURR) ×2
BURR SRG MED 2.1XXCUT FSSR (BURR)
BURR SURG 4X8 MED (BURR)
CANISTER SUCTION 2500CC (MISCELLANEOUS) ×3 IMPLANT
CLEANER TIP ELECTROSURG 2X2 (MISCELLANEOUS) IMPLANT
CLOTH BEACON ORANGE TIMEOUT ST (SAFETY) ×3 IMPLANT
CONT SPEC 4OZ CLIKSEAL STRL BL (MISCELLANEOUS) IMPLANT
CONT SPEC STER OR (MISCELLANEOUS) IMPLANT
COVER SURGICAL LIGHT HANDLE (MISCELLANEOUS) ×3 IMPLANT
DRAPE ORTHO SPLIT 77X108 STRL (DRAPES) ×3
DRAPE SURG ORHT 6 SPLT 77X108 (DRAPES) ×1 IMPLANT
DRESSING ADAPTIC 1/2  N-ADH (PACKING) ×3 IMPLANT
ELECT COATED BLADE 2.86 ST (ELECTRODE) IMPLANT
ELECT REM PT RETURN 9FT ADLT (ELECTROSURGICAL)
ELECTRODE REM PT RTRN 9FT ADLT (ELECTROSURGICAL) IMPLANT
GAUZE PACKING FOLDED 2  STR (GAUZE/BANDAGES/DRESSINGS) ×1
GAUZE PACKING FOLDED 2 STR (GAUZE/BANDAGES/DRESSINGS) ×2 IMPLANT
GAUZE SPONGE 4X4 16PLY XRAY LF (GAUZE/BANDAGES/DRESSINGS) ×3 IMPLANT
GLOVE BIO SURGEON STRL SZ 6.5 (GLOVE) ×2 IMPLANT
GLOVE BIO SURGEON STRL SZ7.5 (GLOVE) IMPLANT
GLOVE BIOGEL PI IND STRL 6.5 (GLOVE) ×2 IMPLANT
GLOVE BIOGEL PI IND STRL 7.0 (GLOVE) IMPLANT
GLOVE BIOGEL PI IND STRL 7.5 (GLOVE) ×3 IMPLANT
GLOVE BIOGEL PI INDICATOR 6.5 (GLOVE)
GLOVE BIOGEL PI INDICATOR 7.0 (GLOVE) ×1
GLOVE BIOGEL PI INDICATOR 7.5 (GLOVE) ×2
GLOVE ORTHO TXT STRL SZ7.5 (GLOVE) ×2 IMPLANT
GLOVE SURG SS PI 7.5 STRL IVOR (GLOVE) ×2 IMPLANT
GOWN STRL NON-REIN LRG LVL3 (GOWN DISPOSABLE) ×8 IMPLANT
KIT BASIN OR (CUSTOM PROCEDURE TRAY) ×3 IMPLANT
KIT ROOM TURNOVER OR (KITS) ×3 IMPLANT
NDL BLUNT 16X1.5 OR ONLY (NEEDLE) ×4 IMPLANT
NDL DENTAL 27 LONG (NEEDLE) ×4 IMPLANT
NEEDLE BLUNT 16X1.5 OR ONLY (NEEDLE) ×6 IMPLANT
NEEDLE DENTAL 27 LONG (NEEDLE) ×6 IMPLANT
NS IRRIG 1000ML POUR BTL (IV SOLUTION) ×3 IMPLANT
PACK EENT II TURBAN DRAPE (CUSTOM PROCEDURE TRAY) ×3 IMPLANT
PAD ARMBOARD 7.5X6 YLW CONV (MISCELLANEOUS) ×6 IMPLANT
PENCIL BUTTON HOLSTER BLD 10FT (ELECTRODE) IMPLANT
SOLUTION BETADINE 4OZ (MISCELLANEOUS) ×1 IMPLANT
SPONGE GAUZE 4X4 12PLY (GAUZE/BANDAGES/DRESSINGS) ×2 IMPLANT
SUT CHROMIC 3 0 PS 2 (SUTURE) ×6 IMPLANT
SYR 50ML SLIP (SYRINGE) ×6 IMPLANT
SYR BULB IRRIGATION 50ML (SYRINGE) ×3 IMPLANT
TOOTHBRUSH ADULT (PERSONAL CARE ITEMS) ×3 IMPLANT
TOWEL OR 17X24 6PK STRL BLUE (TOWEL DISPOSABLE) ×3 IMPLANT
TOWEL OR 17X26 10 PK STRL BLUE (TOWEL DISPOSABLE) ×3 IMPLANT
TRAY ENT MC OR (CUSTOM PROCEDURE TRAY) ×2 IMPLANT
TUBE CONNECTING 12X1/4 (SUCTIONS) ×3 IMPLANT
WATER STERILE IRR 1000ML POUR (IV SOLUTION) ×2 IMPLANT

## 2013-03-05 NOTE — Interval H&P Note (Signed)
History and Physical Interval Note:  03/05/2013 7:32 AM  [name]  has presented today for surgery, with the diagnosis of NON RESTORABLE TEETH  The various methods of treatment have been discussed with the patient and family. After consideration of risks, benefits and other options for treatment, the patient has consented to  Procedure(s): MULTIPLE EXTRACION WITH ALVEOLOPLASTY, extraction of decayed teeth numbers 3,4,5,18,19,22,23,24,25, extraction of retained root tips teeth numbers 1,2,20,21,26, bilateral mandibular alveoloplasty and upper right maxillary alveoloplasty (N/A) EXTRACTION MOLARS (Bilateral) as a surgical intervention .  The patient's history has been reviewed, patient examined, no change in status, stable for surgery.  I have reviewed the patient's chart and labs.  Questions were answered to the patient's satisfaction.     Accident,Shirl Weir L

## 2013-03-05 NOTE — Anesthesia Postprocedure Evaluation (Signed)
  Anesthesia Post-op Note  Patient: [name]  Procedure(s) Performed: Procedure(s): MULTIPLE EXTRACION WITH ALVEOLOPLASTY, extraction of decayed teeth numbers 3,4,5,18,19,22,23,24,25, extraction of retained root tips teeth numbers 2,20,21,26, bilateral mandibular alveoloplasty and upper right maxillary alveoloplasty (N/A) EXTRACTION MOLARS (Bilateral)  Patient Location: PACU  Anesthesia Type:General  Level of Consciousness: awake  Airway and Oxygen Therapy: Patient Spontanous Breathing  Post-op Pain: mild  Post-op Assessment: Post-op Vital signs reviewed  Post-op Vital Signs: Reviewed  Complications: No apparent anesthesia complications

## 2013-03-05 NOTE — Preoperative (Signed)
Beta Blockers   Reason not to administer Beta Blockers:Not Applicable 

## 2013-03-05 NOTE — Anesthesia Preprocedure Evaluation (Signed)
Anesthesia Evaluation  Patient identified by MRN, date of birth, ID band Patient awake    Reviewed: Allergy & Precautions, H&P , NPO status , Patient's Chart, lab work & pertinent test results  Airway Mallampati: II      Dental   Pulmonary neg pulmonary ROS,  breath sounds clear to auscultation        Cardiovascular Rhythm:Regular Rate:Normal     Neuro/Psych  Headaches, Anxiety    GI/Hepatic GERD-  ,  Endo/Other  diabetesHypothyroidism   Renal/GU Renal disease     Musculoskeletal   Abdominal   Peds  Hematology   Anesthesia Other Findings   Reproductive/Obstetrics                           Anesthesia Physical Anesthesia Plan  ASA: III  Anesthesia Plan: General   Post-op Pain Management:    Induction: Intravenous  Airway Management Planned: Oral ETT  Additional Equipment:   Intra-op Plan:   Post-operative Plan: Possible Post-op intubation/ventilation  Informed Consent: I have reviewed the patients History and Physical, chart, labs and discussed the procedure including the risks, benefits and alternatives for the proposed anesthesia with the patient or authorized representative who has indicated his/her understanding and acceptance.     Plan Discussed with: CRNA, Anesthesiologist and Surgeon  Anesthesia Plan Comments:         Anesthesia Quick Evaluation

## 2013-03-05 NOTE — Brief Op Note (Signed)
03/05/2013  7:46 AM  PATIENT:  [name]  47 y.o. female  PRE-OPERATIVE DIAGNOSIS:  Carious teeth #3, 4, 5; 18, 19, 22, 23, 24, 25 Retained root tips #1, 2, 20, 21, 26  POST-OPERATIVE DIAGNOSIS:  Carious teeth #3, 4, 5; 18, 19, 22, 23, 24, 25 Retained root tips #2, 20, 21, 26   (Tooth #1 was not present)   PROCEDURE:    Surgical extraction of carious teeth #3, 4, 5; 18, 19, 22, 23, 24, 25 Extraction of retained root tips #2, 20, 21, 26 Maxillary Right Alveoloplasty, Mandibular Right and Left Alveoloplasty  SURGEON:  Surgeon(s) and Role:    * Francene Finders, DDS - Primary  PHYSICIAN ASSISTANT: None  ASSISTANTS: Harrie Foreman   ANESTHESIA:   general  EBL:  Minimal  BLOOD ADMINISTERED:none  DRAINS: none   LOCAL MEDICATIONS USED:  4 carpules of 0.5% MARCAINE with 1:200,000 epinephrine and 9 carpules of 2.0% LIDOCAINE with 1:100,000    SPECIMEN:  No Specimen  DISPOSITION OF SPECIMEN:  N/A  COUNTS:  YES  TOURNIQUET:  * No tourniquets in log *  DICTATION: .Note written in EPIC  PLAN OF CARE: Discharge to home after PACU  PATIENT DISPOSITION:  PACU - hemodynamically stable.   Delay start of Pharmacological VTE agent (>24hrs) due to surgical blood loss or risk of bleeding: not applicable

## 2013-03-05 NOTE — Op Note (Signed)
03/05/2013  7:49 AM  PATIENT:  [name]  47 y.o. female  PRE-OPERATIVE DIAGNOSIS:  Carious teeth #3, 4, 5; 18, 19, 22, 23, 24, 25 Retained root tips #1, 2, 20, 21, 26  POST-OPERATIVE DIAGNOSIS:  Carious teeth #3, 4, 5; 18, 19, 22, 23, 24, 25 Retained root tips #2, 20, 21, 26   (Tooth #1 was not present)   INDICATIONS FOR PROCEDURE: [name] is a 47 year old female that is requiring all of her remaining teeth be removed due to poor dentition and caries. 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 Although, the radiation was fractionated she does still have some risk of osteoradionecrosis status post extraction. She also has been treated with IV bisphosphonates and therefore also has a risk for bisphosphonate related osteonecrosis of the jaw. Due to her multiple risk factors for osteonecrosis the patient and I decided to remove a few teeth with local anesthetic in office to evaluate her healing potential. Teeth #'s 27, 28, 29, 30 were removed and the patient followed up two weeks status post extraction with excellent healing. It was deemed appropriate that the patient have the remaining carious teeth removed.    PROCEDURE:    Surgical extraction of carious teeth #3, 4, 5; 18, 19, 22, 23, 24, 25 Extraction of retained root tips #2, 20, 21, 26 Maxillary Right Alveoloplasty, Mandibular Right and Left Alveoloplasty  SURGEON:  Surgeon(s) and Role:    * Francene Finders, DDS - Primary  PHYSICIAN ASSISTANT: None  ASSISTANTS: Harrie Foreman   PROCEDURE IN DETAIL: The patient was seen in the preoperative area. All questions were answered the history and physical was updated and verified.  The consent was reviewed and signed .  The patient was taken to the operating room by the anesthesia service.   Patient was placed on the table in the supine position. The patient was prepped and draped in the usual sterile fashion  for all maxillofacial surgery procedures.  A moisten raytec was placed in the patient oropharynx.   Four carpules of 0.5% Marcaine with 1:200,000 epinephrine and nine carpules of 2.0% Lidociane with 1:100,000 was used to anesthetize #2, 3, 4, 5, 18, 19, 20, 21, 22, 23, 24, 25, and 26   Next a 15 blade was used to make a full thickness  mucoperiosteal flap along the sulcus of teeth.  Next, a periosteal elevator was used to raise the flap.  Teeth #3, 4, 5; 18, 19, 22, 23, 24, 25 were surgically removed and retained roots #2, 20, 21, 26 were removed.  There was no tooth #1 present after exploration; therefore, this tooth was not removed.  Copious irrigation with normal saline was performed and then 3.0 chromic was used to close the wound.  All counts were correct times two.  Patient was extubated and taken to the PACU were she recovered well.  ANESTHESIA:   general  EBL:  Minimal  BLOOD ADMINISTERED:none  DRAINS: none   LOCAL MEDICATIONS USED:  4 carpules of 0.5% MARCAINE with 1:200,000 epinephrine and 9 carpules of 2.0% LIDOCAINE with 1:100,000    SPECIMEN:  No Specimen  DISPOSITION OF SPECIMEN:  N/A  COUNTS:  YES  TOURNIQUET:  * No tourniquets in log *  DICTATION: .Note written in EPIC  PLAN OF CARE: Discharge to home after PACU  PATIENT DISPOSITION:  PACU - hemodynamically stable.   Delay start of Pharmacological VTE agent (>24hrs) due to surgical blood  loss or risk of bleeding: not applicable

## 2013-03-05 NOTE — Transfer of Care (Signed)
Immediate Anesthesia Transfer of Care Note  Patient: [name]  Procedure(s) Performed: Procedure(s): MULTIPLE EXTRACION WITH ALVEOLOPLASTY, extraction of decayed teeth numbers 3,4,5,18,19,22,23,24,25, extraction of retained root tips teeth numbers 2,20,21,26, bilateral mandibular alveoloplasty and upper right maxillary alveoloplasty (N/A) EXTRACTION MOLARS (Bilateral)  Patient Location: PACU  Anesthesia Type:MAC  Level of Consciousness: awake and alert   Airway & Oxygen Therapy: Patient Spontanous Breathing and Patient connected to nasal cannula oxygen  Post-op Assessment: Report given to PACU RN and Post -op Vital signs reviewed and stable  Post vital signs: Reviewed and stable  Complications: No apparent anesthesia complications

## 2013-03-06 ENCOUNTER — Encounter (HOSPITAL_COMMUNITY): Payer: Self-pay | Admitting: Oral and Maxillofacial Surgery

## 2013-03-07 ENCOUNTER — Ambulatory Visit: Payer: PRIVATE HEALTH INSURANCE | Admitting: Obstetrics & Gynecology

## 2013-03-15 ENCOUNTER — Ambulatory Visit: Payer: PRIVATE HEALTH INSURANCE | Admitting: Obstetrics & Gynecology

## 2013-04-16 ENCOUNTER — Other Ambulatory Visit: Payer: Self-pay

## 2013-04-16 DIAGNOSIS — Z1231 Encounter for screening mammogram for malignant neoplasm of breast: Secondary | ICD-10-CM

## 2013-04-18 ENCOUNTER — Encounter (HOSPITAL_COMMUNITY): Payer: Self-pay | Admitting: Emergency Medicine

## 2013-04-18 ENCOUNTER — Emergency Department (INDEPENDENT_AMBULATORY_CARE_PROVIDER_SITE_OTHER)
Admission: EM | Admit: 2013-04-18 | Discharge: 2013-04-18 | Disposition: A | Discharge status: Home / Self Care | Payer: PRIVATE HEALTH INSURANCE | Source: Home / Self Care | Attending: Family Medicine | Admitting: Family Medicine

## 2013-04-18 DIAGNOSIS — R0789 Other chest pain: Secondary | ICD-10-CM

## 2013-04-18 DIAGNOSIS — R071 Chest pain on breathing: Secondary | ICD-10-CM

## 2013-04-18 DIAGNOSIS — B372 Candidiasis of skin and nail: Secondary | ICD-10-CM

## 2013-04-18 MED ORDER — KETOROLAC TROMETHAMINE 30 MG/ML IJ SOLN
30.0000 mg | Freq: Once | INTRAMUSCULAR | Status: AC
  Administered 2013-04-18: 30 mg via INTRAMUSCULAR

## 2013-04-18 MED ORDER — KETOROLAC TROMETHAMINE 30 MG/ML IJ SOLN
INTRAMUSCULAR | Status: AC
  Filled 2013-04-18: qty 1

## 2013-04-18 MED ORDER — KETOCONAZOLE 200 MG PO TABS: 200.0000 mg | ORAL_TABLET | Freq: Every day | ORAL | Status: DC

## 2013-04-18 MED ORDER — KETOROLAC TROMETHAMINE 10 MG PO TABS: 10.0000 mg | ORAL_TABLET | Freq: Four times a day (QID) | ORAL | Status: DC | PRN

## 2013-04-18 MED ORDER — CLOTRIMAZOLE 1 % EX CREA: TOPICAL_CREAM | CUTANEOUS | Status: DC

## 2013-04-18 NOTE — ED Notes (Signed)
C/o right breast pain by ribs.  Patient states it hurts to breathe, sneeze, and cough.   States feels like she pulled a muscle  patient is unable to turn with out pain.  No mass present.   norco and heating pad used as treatment.

## 2013-04-18 NOTE — ED Provider Notes (Signed)
CSN: [id number]     Arrival date & time 04/18/13  1626 History   First MD Initiated Contact with Patient 04/18/13 1721     Chief Complaint  Patient presents with  . Breast Pain   (Consider location/radiation/quality/duration/timing/severity/associated sxs/prior Treatment) Patient is a 47 y.o. female presenting with rash. The history is provided by the patient and a parent.  Rash Pain location:  RUQ Pain quality: sharp   Pain radiates to:  Chest Pain severity:  Moderate Onset quality:  Gradual Progression:  Unchanged Chronicity:  New Ineffective treatments:  OTC medications Associated symptoms: chest pain   Risk factors comment:  Lupus   Past Medical History  Diagnosis Date  . Lupus   . Cancer     nasopharenx  . 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 Irregular menstrual cycle   . Papanicolaou smear of cervix with atypical squamous cells of undetermined significance (ASC-US)   . Excessive or frequent menstruation   . Leiomyoma of uterus, unspecified   . Trichimoniasis   . Psoriasis   . Headache(784.0)   . GERD (gastroesophageal reflux disease)   . Arthritis     ra, lupus  . Anxiety   . Hypothyroidism    Past Surgical History  Procedure Laterality Date  . Ankle arthroscopy    . Cesarean section    . Lymph node biopsy      neck  . Multiple extractions with alveoloplasty N/A 03/05/2013    Procedure: MULTIPLE EXTRACION WITH ALVEOLOPLASTY, extraction of decayed teeth numbers 3,4,5,18,19,22,23,24,25, extraction of retained root tips teeth numbers 2,20,21,26, bilateral mandibular alveoloplasty and upper right maxillary alveoloplasty;  Surgeon: Francene Finders, DDS;  Location: [facility] OR;  Service: Oral Surgery;  Laterality: N/A;  . Tooth extraction Bilateral 03/05/2013    Procedure: EXTRACTION MOLARS;  Surgeon: Francene Finders, DDS;  Location: [facility] OR;  Service: Oral Surgery;  Laterality: Bilateral;   Family History  Problem Relation Age  of Onset  . Lupus Brother    History  Substance Use Topics  . Smoking status: Former Smoker    Quit date: 02/28/2004  . Smokeless tobacco: Never Used  . Alcohol Use: No   OB History   Grav Para Term Preterm Abortions TAB SAB Ect Mult Living                 Review of Systems  Constitutional: Negative.   Cardiovascular: Positive for chest pain.  Skin: Positive for rash.    Allergies  Amoxicillin; Latex; Plaquenil; Bactrim; Ciprofloxacin hcl; Keflex; Penicillins; and Tetracyclines & related  Home Medications   Current Outpatient Rx  Name  Route  Sig  Dispense  Refill  . albuterol (PROVENTIL HFA;VENTOLIN HFA) 108 (90 BASE) MCG/ACT inhaler   Inhalation   Inhale 2 puffs into the lungs every 6 (six) hours as needed. For breathing         . celecoxib (CELEBREX) 200 MG capsule   Oral   Take 200 mg by mouth daily.         . clotrimazole (LOTRIMIN) 1 % cream      Apply to affected area 2 times daily   60 g   0   . cyclobenzaprine (FLEXERIL) 10 MG tablet   Oral   Take 10 mg by mouth 2 (two) times daily as needed. For pain         . ferrous sulfate 325 (65 FE) MG tablet   Oral   Take 325 mg by  mouth daily with breakfast.         . folic acid (FOLVITE) 1 MG tablet   Oral   Take 2 mg by mouth daily.          . furosemide (LASIX) 80 MG tablet   Oral   Take 80 mg by mouth daily as needed for fluid.         Marland Kitchen gabapentin (NEURONTIN) 100 MG capsule   Oral   Take 100 mg by mouth 2 (two) times daily.         Marland Kitchen GLUCOSAMINE HCL PO   Oral   Take 1 tablet by mouth daily.         Marland Kitchen HYDROcodone-acetaminophen (NORCO) 10-325 MG per tablet   Oral   Take 1 tablet by mouth every 6 (six) hours as needed for pain.         . hydrOXYzine (ATARAX/VISTARIL) 10 MG tablet   Oral   Take 10 mg by mouth 2 (two) times daily as needed for itching.         Marland Kitchen ketoconazole (NIZORAL) 200 MG tablet   Oral   Take 1 tablet (200 mg total) by mouth daily.   14 tablet   0    . ketorolac (TORADOL) 10 MG tablet   Oral   Take 1 tablet (10 mg total) by mouth every 6 (six) hours as needed for pain.   20 tablet   1   . levothyroxine (SYNTHROID, LEVOTHROID) 75 MCG tablet   Oral   Take 75 mcg by mouth daily.         Marland Kitchen LORazepam (ATIVAN) 1 MG tablet   Oral   Take 1 mg by mouth daily.         . methotrexate (RHEUMATREX) 2.5 MG tablet   Oral   Take 22.5 mg by mouth once a week. On mondays         . Multiple Vitamin (MULTIVITAMIN WITH MINERALS) TABS   Oral   Take 1 tablet by mouth daily.         . pantoprazole (PROTONIX) 40 MG tablet   Oral   Take 40 mg by mouth 2 (two) times daily.          . predniSONE (DELTASONE) 5 MG tablet   Oral   Take 5 mg by mouth daily.         . rizatriptan (MAXALT-MLT) 10 MG disintegrating tablet   Oral   Take 10 mg by mouth as needed for migraine. May repeat in 2 hours if needed         . Tamsulosin HCl (FLOMAX) 0.4 MG CAPS   Oral   Take 1 capsule (0.4 mg total) by mouth daily.   14 capsule   0   . topiramate (TOPAMAX) 100 MG tablet   Oral   Take 100 mg by mouth daily.         Marland Kitchen VITAMIN E PO   Oral   Take 200 mg by mouth 2 (two) times daily.           BP 132/65  Pulse 105  Temp(Src) 98.7 F (37.1 C) (Oral)  Resp 12  SpO2 99%  LMP 03/12/2013 Physical Exam  Nursing note and vitals reviewed. Constitutional: She appears well-developed and well-nourished.  Pulmonary/Chest: She exhibits tenderness.  Skin: Skin is warm and dry. Rash noted.  Moist hyperemia between breasts and under right breast.    ED Course  Procedures (including critical care time) Labs Review  Labs Reviewed - No data to display Imaging Review No results found.    MDM      Linna Hoff, MD 04/20/13 [phone]

## 2013-04-19 NOTE — ED Notes (Signed)
Pt called states that the pharmacy filled her ketorolac 10mg  by mistake and gave it to her. Now the ins co is needing a P.A or pt is to bring back the medication and pay for it.... Pt refuses to bring back the medication and doesn't understand why we will not give them a P.A.Marland KitchenMarland KitchenMarland Kitchen Adv pt that his is an Urgent Care and we typically don't do this... Adv pt that her ins co will cover for meloxicam or diclofenac and that we will be happy to Rx her new meds but she needs to return the Ketorolac to [employer]Marland KitchenMarland Kitchen Pt refused and says the ins co faxed over a PA form for provider to complete... Adv pt that we will wait for the fax and it's up to the provider to complete it... Usually there is a 72 hour turnaround for such forms... Pt verbalized understanding.   Called the pharmacy and notified them.

## 2013-04-23 ENCOUNTER — Ambulatory Visit: Payer: PRIVATE HEALTH INSURANCE | Admitting: Obstetrics & Gynecology

## 2013-04-24 ENCOUNTER — Ambulatory Visit (HOSPITAL_COMMUNITY)
Admission: RE | Admit: 2013-04-24 | Discharge: 2013-04-24 | Disposition: A | Discharge status: Home / Self Care | Payer: PRIVATE HEALTH INSURANCE | Source: Ambulatory Visit | Attending: Internal Medicine | Admitting: Internal Medicine

## 2013-04-24 ENCOUNTER — Other Ambulatory Visit (HOSPITAL_COMMUNITY): Payer: Self-pay | Admitting: Internal Medicine

## 2013-04-24 DIAGNOSIS — R52 Pain, unspecified: Secondary | ICD-10-CM

## 2013-04-24 DIAGNOSIS — R059 Cough, unspecified: Secondary | ICD-10-CM | POA: Insufficient documentation

## 2013-04-24 DIAGNOSIS — R079 Chest pain, unspecified: Secondary | ICD-10-CM | POA: Insufficient documentation

## 2013-04-24 DIAGNOSIS — R05 Cough: Secondary | ICD-10-CM | POA: Insufficient documentation

## 2013-04-24 DIAGNOSIS — R0602 Shortness of breath: Secondary | ICD-10-CM | POA: Insufficient documentation

## 2013-05-01 ENCOUNTER — Encounter (HOSPITAL_COMMUNITY): Payer: Self-pay | Admitting: Emergency Medicine

## 2013-05-01 ENCOUNTER — Emergency Department (HOSPITAL_COMMUNITY): Payer: PRIVATE HEALTH INSURANCE

## 2013-05-01 ENCOUNTER — Emergency Department (HOSPITAL_COMMUNITY)
Admission: EM | Admit: 2013-05-01 | Discharge: 2013-05-01 | Disposition: A | Discharge status: Home / Self Care | Payer: PRIVATE HEALTH INSURANCE | Attending: Emergency Medicine | Admitting: Emergency Medicine

## 2013-05-01 DIAGNOSIS — Z87891 Personal history of nicotine dependence: Secondary | ICD-10-CM | POA: Insufficient documentation

## 2013-05-01 DIAGNOSIS — R091 Pleurisy: Secondary | ICD-10-CM | POA: Diagnosis not present

## 2013-05-01 DIAGNOSIS — Z91040 Latex allergy status: Secondary | ICD-10-CM | POA: Insufficient documentation

## 2013-05-01 DIAGNOSIS — M129 Arthropathy, unspecified: Secondary | ICD-10-CM | POA: Diagnosis not present

## 2013-05-01 DIAGNOSIS — Z880 Allergy status to penicillin: Secondary | ICD-10-CM | POA: Insufficient documentation

## 2013-05-01 DIAGNOSIS — F411 Generalized anxiety disorder: Secondary | ICD-10-CM | POA: Insufficient documentation

## 2013-05-01 DIAGNOSIS — E039 Hypothyroidism, unspecified: Secondary | ICD-10-CM | POA: Diagnosis not present

## 2013-05-01 DIAGNOSIS — K219 Gastro-esophageal reflux disease without esophagitis: Secondary | ICD-10-CM | POA: Insufficient documentation

## 2013-05-01 DIAGNOSIS — Z872 Personal history of diseases of the skin and subcutaneous tissue: Secondary | ICD-10-CM | POA: Insufficient documentation

## 2013-05-01 DIAGNOSIS — Z79899 Other long term (current) drug therapy: Secondary | ICD-10-CM | POA: Insufficient documentation

## 2013-05-01 DIAGNOSIS — Z8742 Personal history of other diseases of the female genital tract: Secondary | ICD-10-CM | POA: Insufficient documentation

## 2013-05-01 DIAGNOSIS — Z8619 Personal history of other infectious and parasitic diseases: Secondary | ICD-10-CM | POA: Insufficient documentation

## 2013-05-01 DIAGNOSIS — E119 Type 2 diabetes mellitus without complications: Secondary | ICD-10-CM | POA: Insufficient documentation

## 2013-05-01 DIAGNOSIS — Z85819 Personal history of malignant neoplasm of unspecified site of lip, oral cavity, and pharynx: Secondary | ICD-10-CM | POA: Insufficient documentation

## 2013-05-01 DIAGNOSIS — R079 Chest pain, unspecified: Secondary | ICD-10-CM | POA: Diagnosis present

## 2013-05-01 LAB — BASIC METABOLIC PANEL
BUN: 13 mg/dL (ref 6–23)
CO2: 21 mEq/L (ref 19–32)
Calcium: 9 mg/dL (ref 8.4–10.5)
Chloride: 108 mEq/L (ref 96–112)
Creatinine, Ser: 1.04 mg/dL (ref 0.50–1.10)
GFR calc Af Amer: 72 mL/min — ABNORMAL LOW (ref >90)
GFR calc non Af Amer: 62 mL/min — ABNORMAL LOW (ref >90)
Glucose, Bld: 88 mg/dL (ref 70–99)
Potassium: 4.1 mEq/L (ref 3.5–5.1)
Sodium: 140 mEq/L (ref 135–145)

## 2013-05-01 LAB — POCT I-STAT TROPONIN I: Troponin i, poc: 0 ng/mL (ref 0.00–0.08)

## 2013-05-01 LAB — PRO B NATRIURETIC PEPTIDE: Pro B Natriuretic peptide (BNP): 331.3 pg/mL — ABNORMAL HIGH (ref 0–125)

## 2013-05-01 MED ORDER — TRAMADOL HCL 50 MG PO TABS: 50.0000 mg | ORAL_TABLET | Freq: Four times a day (QID) | ORAL | Status: DC | PRN

## 2013-05-01 MED ORDER — IOHEXOL 300 MG/ML  SOLN
80.0000 mL | Freq: Once | INTRAMUSCULAR | Status: AC | PRN
  Administered 2013-05-01: 80 mL via INTRAVENOUS

## 2013-05-01 NOTE — ED Notes (Signed)
Phlebotomy at bedside.

## 2013-05-01 NOTE — ED Notes (Signed)
Patient transported to CT 

## 2013-05-01 NOTE — ED Notes (Signed)
Pt is here with sob and upper right rib/chest pain.  Pt states this has been going on for over 1.5 months.  Pt has been to urgent care and PMD with scripts for abx and pain.  Pt continues to have pain and shortness of breath and had xray that showed bronchitis.  Pt told md that she was coming here.  Pt was told MD order CT of chest.

## 2013-05-01 NOTE — ED Notes (Signed)
Pt c/o lower R rib pain that worsens with coughing, breathing, and laughing x 1 month. Pt was at PCP today and was Dx w/ bronchitis. Pt verbalizes she was unhappy with the results at her PCP and wanted to come to the ED for further evaluation

## 2013-05-01 NOTE — ED Notes (Signed)
Phlebotomy notified pt needs CBC redrawn 

## 2013-05-01 NOTE — ED Provider Notes (Signed)
CSN: [id number]     Arrival date & time 05/01/13  1140 History   First MD Initiated Contact with Patient 05/01/13 1228     Chief Complaint  Patient presents with  . Shortness of Breath  . Chest Pain   (Consider location/radiation/quality/duration/timing/severity/associated sxs/prior Treatment) HPI  47 year old female with chest pain. Right-sided. Onset about a month ago. No trauma. Pain is worse with deep inspiration, coughing or laughing. Sharp in nature. Brief, lasting seconds. No shortness of breath. No unusual leg pain or swelling. Patient has been evaluated twice since symptom onset. She is diagnosed with bronchitis. She reports changing a course of antibiotics without any change in her symptoms. No dizziness or lightheadedness. No diaphoresis. Her pain has not radiated. No rash.  Past Medical History  Diagnosis Date  . Lupus   . Cancer     nasopharenx  . 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 Irregular menstrual cycle   . Papanicolaou smear of cervix with atypical squamous cells of undetermined significance (ASC-US)   . Excessive or frequent menstruation   . Leiomyoma of uterus, unspecified   . Trichimoniasis   . Psoriasis   . Headache(784.0)   . GERD (gastroesophageal reflux disease)   . Arthritis     ra, lupus  . Anxiety   . Hypothyroidism    Past Surgical History  Procedure Laterality Date  . Ankle arthroscopy    . Cesarean section    . Lymph node biopsy      neck  . Multiple extractions with alveoloplasty N/A 03/05/2013    Procedure: MULTIPLE EXTRACION WITH ALVEOLOPLASTY, extraction of decayed teeth numbers 3,4,5,18,19,22,23,24,25, extraction of retained root tips teeth numbers 2,20,21,26, bilateral mandibular alveoloplasty and upper right maxillary alveoloplasty;  Surgeon: Francene Finders, DDS;  Location: [facility] OR;  Service: Oral Surgery;  Laterality: N/A;  . Tooth extraction Bilateral 03/05/2013    Procedure: EXTRACTION MOLARS;   Surgeon: Francene Finders, DDS;  Location: [facility] OR;  Service: Oral Surgery;  Laterality: Bilateral;   Family History  Problem Relation Age of Onset  . Lupus Brother    History  Substance Use Topics  . Smoking status: Former Smoker    Quit date: 02/28/2004  . Smokeless tobacco: Never Used  . Alcohol Use: No   OB History   Grav Para Term Preterm Abortions TAB SAB Ect Mult Living                 Review of Systems  All systems reviewed and negative, other than as noted in HPI.   Allergies  Amoxicillin; Latex; Percocet; Plaquenil; Bactrim; Ciprofloxacin hcl; Keflex; Penicillins; and Tetracyclines & related  Home Medications   Current Outpatient Rx  Name  Route  Sig  Dispense  Refill  . albuterol (PROVENTIL HFA;VENTOLIN HFA) 108 (90 BASE) MCG/ACT inhaler   Inhalation   Inhale 2 puffs into the lungs every 6 (six) hours as needed. For breathing         . celecoxib (CELEBREX) 200 MG capsule   Oral   Take 200 mg by mouth daily.         . clotrimazole (LOTRIMIN) 1 % cream      Apply to affected area 2 times daily   60 g   0   . cyclobenzaprine (FLEXERIL) 10 MG tablet   Oral   Take 10 mg by mouth 2 (two) times daily as needed. For pain         . ferrous  sulfate 325 (65 FE) MG tablet   Oral   Take 325 mg by mouth daily with breakfast.         . folic acid (FOLVITE) 1 MG tablet   Oral   Take 2 mg by mouth daily.          . furosemide (LASIX) 80 MG tablet   Oral   Take 80 mg by mouth daily as needed for fluid.         Marland Kitchen gabapentin (NEURONTIN) 100 MG capsule   Oral   Take 100 mg by mouth 2 (two) times daily.         Marland Kitchen GLUCOSAMINE HCL PO   Oral   Take 1 tablet by mouth daily.         Marland Kitchen HYDROcodone-acetaminophen (NORCO) 10-325 MG per tablet   Oral   Take 1 tablet by mouth every 6 (six) hours as needed for pain.         . hydrOXYzine (ATARAX/VISTARIL) 10 MG tablet   Oral   Take 10 mg by mouth 2 (two) times daily as needed for itching.          Marland Kitchen ketoconazole (NIZORAL) 200 MG tablet   Oral   Take 1 tablet (200 mg total) by mouth daily.   14 tablet   0   . ketorolac (TORADOL) 10 MG tablet   Oral   Take 1 tablet (10 mg total) by mouth every 6 (six) hours as needed for pain.   20 tablet   1   . levothyroxine (SYNTHROID, LEVOTHROID) 75 MCG tablet   Oral   Take 75 mcg by mouth daily.         Marland Kitchen LORazepam (ATIVAN) 1 MG tablet   Oral   Take 1 mg by mouth daily.         . methotrexate (RHEUMATREX) 2.5 MG tablet   Oral   Take 22.5 mg by mouth once a week. On mondays         . Multiple Vitamin (MULTIVITAMIN WITH MINERALS) TABS   Oral   Take 1 tablet by mouth daily.         . pantoprazole (PROTONIX) 40 MG tablet   Oral   Take 40 mg by mouth 2 (two) times daily.          . predniSONE (DELTASONE) 5 MG tablet   Oral   Take 5 mg by mouth daily.         . rizatriptan (MAXALT-MLT) 10 MG disintegrating tablet   Oral   Take 10 mg by mouth as needed for migraine. May repeat in 2 hours if needed         . Tamsulosin HCl (FLOMAX) 0.4 MG CAPS   Oral   Take 1 capsule (0.4 mg total) by mouth daily.   14 capsule   0   . topiramate (TOPAMAX) 100 MG tablet   Oral   Take 100 mg by mouth daily.         Marland Kitchen VITAMIN E PO   Oral   Take 200 mg by mouth 2 (two) times daily.           BP 123/67  Pulse 96  Temp(Src) 98.8 F (37.1 C) (Oral)  Resp 24  Wt 214 lb 6 oz (97.24 kg)  SpO2 99%  LMP 01/15/2013 Physical Exam  Nursing note and vitals reviewed. Constitutional: She appears well-developed and well-nourished. No distress.  HENT:  Head: Normocephalic and atraumatic.  Eyes: Conjunctivae are  normal. Right eye exhibits no discharge. Left eye exhibits no discharge.  Neck: Neck supple.  Cardiovascular: Normal rate, regular rhythm and normal heart sounds.  Exam reveals no gallop and no friction rub.   No murmur heard. Pulmonary/Chest: Effort normal and breath sounds normal. No respiratory distress. She  exhibits no tenderness.  Abdominal: Soft. She exhibits no distension. There is no tenderness.  Musculoskeletal: She exhibits no edema and no tenderness.  Lower extremities symmetric as compared to each other. No calf tenderness. Negative Homan's. No palpable cords.   Neurological: She is alert.  Skin: Skin is warm and dry.  Psychiatric: She has a normal mood and affect. Her behavior is normal. Thought content normal.    ED Course  Procedures (including critical care time) Labs Review Labs Reviewed  BASIC METABOLIC PANEL - Abnormal; Notable for the following:    GFR calc non Af Amer 62 (*)    GFR calc Af Amer 72 (*)    All other components within normal limits  PRO B NATRIURETIC PEPTIDE - Abnormal; Notable for the following:    Pro B Natriuretic peptide (BNP) 331.3 (*)    All other components within normal limits  POCT I-STAT TROPONIN I   Imaging Review No results found.  Ct Chest W Contrast  05/01/2013   CLINICAL DATA:  Right posterior rib and back pain common history of lupus  EXAM: CT CHEST WITH CONTRAST  TECHNIQUE: Multidetector CT imaging of the chest was performed during intravenous contrast administration.  CONTRAST:  80mL OMNIPAQUE IOHEXOL 300 MG/ML  SOLN  COMPARISON:  Prior chest x-ray 04/24/2013; prior chest CT 12/30/2005  FINDINGS: Mediastinum:  Unremarkable CT appearance of the thyroid gland. No suspicious mediastinal or hilar adenopathy. No soft tissue mediastinal mass. The thoracic esophagus is unremarkable.  Heart/Vascular: Conventional aortic arch anatomy. No aneurysmal dilatation or dissection. Heart is upper limits of normal for size. No pericardial effusion. Normal caliber central and main pulmonary arteries. No central pulmonary embolus. 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 There are multiple small collaterals over the chest and upper arm.  Lungs/Pleura: The lungs are clear.  No pleural effusion.  Bones/Soft Tissues: No acute fracture or  aggressive appearing lytic or blastic osseous lesion. Dextro convex scoliosis may be positional.  Upper Abdomen: Unremarkable visualized upper abdomen.  IMPRESSION: 1. No acute abnormality in the chest to explain the patient's clinical symptoms. 2. 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   Electronically Signed   By: Malachy Moan M.D.   On: 05/01/2013 15:44   EKG Interpretation   None       MDM   1. Pleurisy     47 year old female with pleuritic R chest pain. Has been going on for the past 6 weeks. Recent imaging including chest x-rays without clear etiology. She's been on antibiotics with no improvement.  She has a history of lupus, and her symptoms may potentially be related to this. Will CT to evaluate further, although my suspicion for this is fairly low.   Workup has been fairly unremarkable. EKG is normal appearing. No distress on exam. Low suspicion for emergent process.   Raeford Razor, MD 05/06/13 1102

## 2013-05-01 NOTE — ED Notes (Signed)
Pt return from CT.

## 2013-05-16 ENCOUNTER — Ambulatory Visit: Payer: Medicare Other

## 2013-05-28 ENCOUNTER — Ambulatory Visit (INDEPENDENT_AMBULATORY_CARE_PROVIDER_SITE_OTHER): Payer: PRIVATE HEALTH INSURANCE | Admitting: Obstetrics & Gynecology

## 2013-05-28 ENCOUNTER — Encounter: Payer: Self-pay | Admitting: Obstetrics & Gynecology

## 2013-05-28 VITALS — BP 120/79 | HR 105 | Temp 97.9°F | Ht 66.0 in | Wt 217.0 lb

## 2013-05-28 DIAGNOSIS — Z0000 Encounter for general adult medical examination without abnormal findings: Secondary | ICD-10-CM

## 2013-05-28 DIAGNOSIS — Z01419 Encounter for gynecological examination (general) (routine) without abnormal findings: Secondary | ICD-10-CM

## 2013-05-28 LAB — POCT URINALYSIS DIPSTICK
Bilirubin, UA: NEGATIVE
Blood, UA: NEGATIVE
Glucose, UA: NEGATIVE
Ketones, UA: NEGATIVE
Nitrite, UA: NEGATIVE
Spec Grav, UA: 1.025
Urobilinogen, UA: NEGATIVE
pH, UA: 5

## 2013-05-28 NOTE — Progress Notes (Signed)
Pt in office today for annual exam, reports being treated for acute bronchitis, and pleurisy for past two months  Subjective:     [name] is a 47 y.o. female here for a routine exam.  Current complaints: none.  Personal health questionnaire reviewed: no.   Gynecologic History Patient's last menstrual period was 03/17/2013. Contraception: OCP (estrogen/progesterone) Last Pap: 2012. Results were: normal Last mammogram: 03/2013. Results were: normal  Obstetric History OB History  No data available     The following portions of the patient's history were reviewed and updated as appropriate: allergies, current medications, past family history, past medical history, past social history, past surgical history and problem list.  Review of Systems Pertinent items are noted in HPI.    Objective:    General appearance: alert Breasts: normal appearance, no masses or tenderness Abdomen: soft, non-tender; bowel sounds normal; no masses,  no organomegaly Pelvic: cervix normal in appearance, external genitalia normal, no adnexal masses or tenderness, uterus slightly enlarged, shape, and consistency and vagina normal without discharge    Assessment:    Healthy female exam.    Plan:    Return prn or in 1 yr

## 2013-05-28 NOTE — Patient Instructions (Signed)

## 2013-05-30 LAB — PAP IG, CT-NG NAA, HPV HIGH-RISK
Chlamydia Probe Amp: NEGATIVE
GC Probe Amp: NEGATIVE
HPV DNA High Risk: NOT DETECTED

## 2013-06-08 ENCOUNTER — Ambulatory Visit (HOSPITAL_COMMUNITY)
Admission: RE | Admit: 2013-06-08 | Discharge: 2013-06-08 | Disposition: A | Discharge status: Home / Self Care | Payer: PRIVATE HEALTH INSURANCE | Source: Ambulatory Visit | Attending: Internal Medicine | Admitting: Internal Medicine

## 2013-06-08 ENCOUNTER — Other Ambulatory Visit (HOSPITAL_COMMUNITY): Payer: Self-pay | Admitting: Internal Medicine

## 2013-06-08 DIAGNOSIS — J40 Bronchitis, not specified as acute or chronic: Secondary | ICD-10-CM

## 2013-06-08 DIAGNOSIS — R059 Cough, unspecified: Secondary | ICD-10-CM | POA: Insufficient documentation

## 2013-06-08 DIAGNOSIS — R05 Cough: Secondary | ICD-10-CM | POA: Insufficient documentation

## 2013-06-11 ENCOUNTER — Emergency Department (HOSPITAL_COMMUNITY)
Admission: EM | Admit: 2013-06-11 | Discharge: 2013-06-11 | Disposition: A | Discharge status: Home / Self Care | Payer: PRIVATE HEALTH INSURANCE | Attending: Emergency Medicine | Admitting: Emergency Medicine

## 2013-06-11 ENCOUNTER — Emergency Department (HOSPITAL_COMMUNITY): Payer: PRIVATE HEALTH INSURANCE

## 2013-06-11 ENCOUNTER — Encounter (HOSPITAL_COMMUNITY): Payer: Self-pay | Admitting: Emergency Medicine

## 2013-06-11 DIAGNOSIS — F411 Generalized anxiety disorder: Secondary | ICD-10-CM | POA: Diagnosis not present

## 2013-06-11 DIAGNOSIS — R5381 Other malaise: Secondary | ICD-10-CM | POA: Diagnosis not present

## 2013-06-11 DIAGNOSIS — R0602 Shortness of breath: Secondary | ICD-10-CM | POA: Diagnosis not present

## 2013-06-11 DIAGNOSIS — R509 Fever, unspecified: Secondary | ICD-10-CM | POA: Insufficient documentation

## 2013-06-11 DIAGNOSIS — E039 Hypothyroidism, unspecified: Secondary | ICD-10-CM | POA: Insufficient documentation

## 2013-06-11 DIAGNOSIS — IMO0002 Reserved for concepts with insufficient information to code with codable children: Secondary | ICD-10-CM | POA: Insufficient documentation

## 2013-06-11 DIAGNOSIS — J189 Pneumonia, unspecified organism: Secondary | ICD-10-CM | POA: Insufficient documentation

## 2013-06-11 DIAGNOSIS — C801 Malignant (primary) neoplasm, unspecified: Secondary | ICD-10-CM | POA: Diagnosis not present

## 2013-06-11 DIAGNOSIS — M129 Arthropathy, unspecified: Secondary | ICD-10-CM | POA: Insufficient documentation

## 2013-06-11 DIAGNOSIS — E119 Type 2 diabetes mellitus without complications: Secondary | ICD-10-CM | POA: Insufficient documentation

## 2013-06-11 DIAGNOSIS — Z87891 Personal history of nicotine dependence: Secondary | ICD-10-CM | POA: Insufficient documentation

## 2013-06-11 DIAGNOSIS — K219 Gastro-esophageal reflux disease without esophagitis: Secondary | ICD-10-CM | POA: Diagnosis not present

## 2013-06-11 DIAGNOSIS — R072 Precordial pain: Secondary | ICD-10-CM | POA: Diagnosis present

## 2013-06-11 DIAGNOSIS — Z880 Allergy status to penicillin: Secondary | ICD-10-CM | POA: Insufficient documentation

## 2013-06-11 DIAGNOSIS — Z79899 Other long term (current) drug therapy: Secondary | ICD-10-CM | POA: Insufficient documentation

## 2013-06-11 DIAGNOSIS — Z8742 Personal history of other diseases of the female genital tract: Secondary | ICD-10-CM | POA: Insufficient documentation

## 2013-06-11 DIAGNOSIS — M329 Systemic lupus erythematosus, unspecified: Secondary | ICD-10-CM | POA: Insufficient documentation

## 2013-06-11 DIAGNOSIS — Z91040 Latex allergy status: Secondary | ICD-10-CM | POA: Insufficient documentation

## 2013-06-11 DIAGNOSIS — R000 Tachycardia, unspecified: Secondary | ICD-10-CM | POA: Insufficient documentation

## 2013-06-11 DIAGNOSIS — R059 Cough, unspecified: Secondary | ICD-10-CM | POA: Insufficient documentation

## 2013-06-11 DIAGNOSIS — R05 Cough: Secondary | ICD-10-CM | POA: Diagnosis not present

## 2013-06-11 LAB — BASIC METABOLIC PANEL
BUN: 9 mg/dL (ref 6–23)
Calcium: 9.5 mg/dL (ref 8.4–10.5)
Creatinine, Ser: 1 mg/dL (ref 0.50–1.10)
GFR calc Af Amer: 76 mL/min — ABNORMAL LOW (ref >90)
GFR calc non Af Amer: 66 mL/min — ABNORMAL LOW (ref >90)

## 2013-06-11 LAB — CBC
HCT: 34.6 % — ABNORMAL LOW (ref 36.0–46.0)
MCHC: 34.1 g/dL (ref 30.0–36.0)
MCV: 95.8 fL (ref 78.0–100.0)
RBC: 3.61 MIL/uL — ABNORMAL LOW (ref 3.87–5.11)
RDW: 14.5 % (ref 11.5–15.5)
WBC: 10 10*3/uL (ref 4.0–10.5)

## 2013-06-11 MED ORDER — HYDROCODONE-ACETAMINOPHEN 10-325 MG PO TABS: 1.0000 | ORAL_TABLET | Freq: Four times a day (QID) | ORAL | Status: DC | PRN

## 2013-06-11 MED ORDER — IOHEXOL 350 MG/ML SOLN
100.0000 mL | Freq: Once | INTRAVENOUS | Status: AC | PRN
  Administered 2013-06-11: 100 mL via INTRAVENOUS

## 2013-06-11 MED ORDER — DIPHENHYDRAMINE HCL 25 MG PO TABS: ORAL_TABLET | ORAL | Status: DC

## 2013-06-11 MED ORDER — DIPHENHYDRAMINE HCL 25 MG PO CAPS
25.0000 mg | ORAL_CAPSULE | Freq: Once | ORAL | Status: AC
  Administered 2013-06-11: 25 mg via ORAL
  Filled 2013-06-11: qty 1

## 2013-06-11 MED ORDER — LEVOFLOXACIN 750 MG PO TABS: 750.0000 mg | ORAL_TABLET | Freq: Every day | ORAL | Status: DC

## 2013-06-11 MED ORDER — LEVOFLOXACIN 750 MG PO TABS
750.0000 mg | ORAL_TABLET | Freq: Once | ORAL | Status: AC
  Administered 2013-06-11: 750 mg via ORAL
  Filled 2013-06-11: qty 1

## 2013-06-11 MED ORDER — MORPHINE SULFATE 4 MG/ML IJ SOLN
4.0000 mg | Freq: Once | INTRAMUSCULAR | Status: AC
  Administered 2013-06-11: 4 mg via INTRAVENOUS
  Filled 2013-06-11: qty 1

## 2013-06-11 NOTE — ED Notes (Signed)
Pt is here with right upper quad pain and chest pain and has recent diagnosis of pleurisy/bronchitis.  Pt was seen by MD on Friday and came back for xray.  Pt states now with cough and chest pains.

## 2013-06-11 NOTE — ED Provider Notes (Signed)
CSN: [id number]     Arrival date & time 06/11/13  1324 History   First MD Initiated Contact with Patient 06/11/13 1601     Chief Complaint  Patient presents with  . Abdominal Pain  . Chest Pain   (Consider location/radiation/quality/duration/timing/severity/associated sxs/prior Treatment) Patient is a 47 y.o. female presenting with chest pain. The history is provided by the patient. No language interpreter was used.  Chest Pain Pain location:  R chest and substernal area (R chest for about 6 weeks, aching, worse w/ deep breathinging, coughing, sneezing.  Substernal CP  for 3 days, intermittent, sharp, lasting 5-10 mins, few times a day at rest.  ) Pain radiates to:  Does not radiate Pain radiates to the back: no   Pain severity:  Moderate Duration: 3 days mid sternal sharp, 6 weeks r sided pain, constant. Progression:  Worsening Chronicity:  New Context: at rest   Relieved by:  Nothing Worsened by:  Deep breathing and coughing Ineffective treatments:  Rest Associated symptoms: cough, fatigue, fever and shortness of breath   Associated symptoms: no abdominal pain, no back pain, no diaphoresis, no headache, no lower extremity edema, no nausea, no numbness, no palpitations, not vomiting and no weakness   Cough:    Cough characteristics:  Non-productive   Duration:  6 weeks   Timing:  Constant   Progression:  Worsening   Chronicity:  New Fever:    Timing:  Intermittent   Temp source:  Subjective Shortness of breath:    Severity:  Moderate   Onset quality:  Gradual   Duration:  6 weeks   Timing:  Intermittent   Progression:  Waxing and waning Risk factors: birth control, diabetes mellitus and obesity   Risk factors: no prior DVT/PE and no smoking     Past Medical History  Diagnosis Date  . Lupus   . Cancer     nasopharenx  . 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 Irregular menstrual cycle   . Papanicolaou smear of cervix with atypical  squamous cells of undetermined significance (ASC-US)   . Excessive or frequent menstruation   . Leiomyoma of uterus, unspecified   . Trichimoniasis   . Psoriasis   . Headache(784.0)   . GERD (gastroesophageal reflux disease)   . Arthritis     ra, lupus  . Anxiety   . Hypothyroidism    Past Surgical History  Procedure Laterality Date  . Ankle arthroscopy    . Cesarean section    . Lymph node biopsy      neck  . Multiple extractions with alveoloplasty N/A 03/05/2013    Procedure: MULTIPLE EXTRACION WITH ALVEOLOPLASTY, extraction of decayed teeth numbers 3,4,5,18,19,22,23,24,25, extraction of retained root tips teeth numbers 2,20,21,26, bilateral mandibular alveoloplasty and upper right maxillary alveoloplasty;  Surgeon: Francene Finders, DDS;  Location: [facility] OR;  Service: Oral Surgery;  Laterality: N/A;  . Tooth extraction Bilateral 03/05/2013    Procedure: EXTRACTION MOLARS;  Surgeon: Francene Finders, DDS;  Location: [facility] OR;  Service: Oral Surgery;  Laterality: Bilateral;   Family History  Problem Relation Age of Onset  . Lupus Brother    History  Substance Use Topics  . Smoking status: Former Smoker    Quit date: 02/28/2004  . Smokeless tobacco: Never Used  . Alcohol Use: No   OB History   Grav Para Term Preterm Abortions TAB SAB Ect Mult Living  Review of Systems  Constitutional: Positive for fever and fatigue. Negative for chills, diaphoresis, activity change and appetite change.  HENT: Negative for congestion, facial swelling, rhinorrhea and sore throat.   Eyes: Negative for photophobia and discharge.  Respiratory: Positive for cough and shortness of breath. Negative for chest tightness.   Cardiovascular: Positive for chest pain. Negative for palpitations and leg swelling.  Gastrointestinal: Negative for nausea, vomiting, abdominal pain and diarrhea.  Endocrine: Negative for polydipsia and polyuria.  Genitourinary: Negative for dysuria, frequency,  difficulty urinating and pelvic pain.  Musculoskeletal: Negative for arthralgias, back pain, neck pain and neck stiffness.  Skin: Negative for color change and wound.  Allergic/Immunologic: Negative for immunocompromised state.  Neurological: Negative for facial asymmetry, weakness, numbness and headaches.  Hematological: Does not bruise/bleed easily.  Psychiatric/Behavioral: Negative for confusion and agitation.    Allergies  Amoxicillin; Latex; Percocet; Plaquenil; Bactrim; Ciprofloxacin hcl; Keflex; Penicillins; and Tetracyclines & related  Home Medications   Current Outpatient Rx  Name  Route  Sig  Dispense  Refill  . alendronate (FOSAMAX) 70 MG tablet   Oral   Take 70 mg by mouth once a week. Take with a full glass of water on an empty stomach.         . budesonide-formoterol (SYMBICORT) 160-4.5 MCG/ACT inhaler   Inhalation   Inhale 2 puffs into the lungs 2 (two) times daily.         . celecoxib (CELEBREX) 200 MG capsule   Oral   Take 200 mg by mouth daily.         . cyclobenzaprine (FLEXERIL) 10 MG tablet   Oral   Take 10 mg by mouth 2 (two) times daily as needed. For pain         . ferrous sulfate 325 (65 FE) MG tablet   Oral   Take 325 mg by mouth daily with breakfast.         . fexofenadine (ALLEGRA) 180 MG tablet   Oral   Take 180 mg by mouth daily.         Marland Kitchen FOLIC ACID PO   Oral   Take 2 mg by mouth daily.         . furosemide (LASIX) 80 MG tablet   Oral   Take 80 mg by mouth daily as needed for fluid.         Marland Kitchen gabapentin (NEURONTIN) 100 MG capsule   Oral   Take 100 mg by mouth 3 (three) times daily.          Marland Kitchen GLUCOSAMINE HCL PO   Oral   Take 1 tablet by mouth daily.         . hydrOXYzine (ATARAX/VISTARIL) 10 MG tablet   Oral   Take 10 mg by mouth every 6 (six) hours as needed for itching.          . levothyroxine (SYNTHROID, LEVOTHROID) 75 MCG tablet   Oral   Take 75 mcg by mouth daily.         Marland Kitchen LORazepam (ATIVAN)  1 MG tablet   Oral   Take 1 mg by mouth daily.         . methotrexate (RHEUMATREX) 2.5 MG tablet   Oral   Take 22.5 mg by mouth once a week. On mondays         . Multiple Vitamin (MULTIVITAMIN WITH MINERALS) TABS   Oral   Take 1 tablet by mouth daily.         Marland Kitchen  pantoprazole (PROTONIX) 40 MG tablet   Oral   Take 40 mg by mouth 2 (two) times daily.          . predniSONE (DELTASONE) 2.5 MG tablet   Oral   Take 2.5 mg by mouth every other day.         . topiramate (TOPAMAX) 100 MG tablet   Oral   Take 100 mg by mouth daily.         Marland Kitchen VITAMIN E PO   Oral   Take 800 mg by mouth daily.          . VOLTAREN 1 % GEL   Topical   Apply 4 g topically 3 (three) times daily after meals.          Marland Kitchen albuterol (PROVENTIL HFA;VENTOLIN HFA) 108 (90 BASE) MCG/ACT inhaler   Inhalation   Inhale 2 puffs into the lungs every 6 (six) hours as needed. For breathing         . diphenhydrAMINE (BENADRYL) 25 MG tablet      Take 25mg  by mouth prior to taking Levaquin.   10 tablet   0   . HYDROcodone-acetaminophen (NORCO) 10-325 MG per tablet   Oral   Take 1 tablet by mouth every 6 (six) hours as needed.   15 tablet   0   . levofloxacin (LEVAQUIN) 750 MG tablet   Oral   Take 1 tablet (750 mg total) by mouth daily. X 7 days   7 tablet   0    BP 133/81  Pulse 100  Temp(Src) 97.9 F (36.6 C) (Oral)  Resp 13  Wt 216 lb (97.977 kg)  SpO2 100%  LMP 03/17/2013 Physical Exam  Constitutional: She is oriented to person, place, and time. She appears well-developed and well-nourished. No distress.  HENT:  Head: Normocephalic and atraumatic.  Mouth/Throat: No oropharyngeal exudate.  Eyes: Pupils are equal, round, and reactive to light.  Neck: Normal range of motion. Neck supple.  Cardiovascular: Regular rhythm and normal heart sounds.  Tachycardia present.  Exam reveals no gallop and no friction rub.   No murmur heard. Pulmonary/Chest: Effort normal and breath sounds  normal. No respiratory distress. She has no wheezes. She has no rales.  Abdominal: Soft. Bowel sounds are normal. She exhibits no distension and no mass. There is no tenderness. There is no rebound and no guarding.  Musculoskeletal: Normal range of motion. She exhibits no edema and no tenderness.  Neurological: She is alert and oriented to person, place, and time.  Skin: Skin is warm and dry.  Psychiatric: She has a normal mood and affect.    ED Course  Procedures (including critical care time) Labs Review Labs Reviewed  CBC - Abnormal; Notable for the following:    RBC 3.61 (*)    Hemoglobin 11.8 (*)    HCT 34.6 (*)    All other components within normal limits  BASIC METABOLIC PANEL - Abnormal; Notable for the following:    Potassium 3.3 (*)    Glucose, Bld 149 (*)    GFR calc non Af Amer 66 (*)    GFR calc Af Amer 76 (*)    All other components within normal limits  POCT I-STAT TROPONIN I   Imaging Review Ct Angio Chest Pe W/cm &/or Wo Cm  06/11/2013   CLINICAL DATA:  Short of breath and cough for 1 week. Chest pain for 2 days.  EXAM: CT ANGIOGRAPHY CHEST WITH CONTRAST  TECHNIQUE: Multidetector CT imaging of  the chest was performed using the standard protocol during bolus administration of intravenous contrast. Multiplanar CT image reconstructions including MIPs were obtained to evaluate the vascular anatomy.  CONTRAST:  OMNIPAQUE IOHEXOL 350 MG/ML SOLN  COMPARISON:  Chest CT, 05/01/2013  FINDINGS: No evidence of a pulmonary embolus.  There are patchy areas of confluent and ground-glass type opacity in the left lower lobe and minimally in the dependent, inferior left upper lobe. The lungs are otherwise clear. No pleural effusions.  The heart is mildly enlarged. The great vessels are normal in caliber. No aortic dissection. There is a 12 mm short axis subcarinal lymph node and a 1 cm short axis left infrahilar lymph node, both likely reactive. No mediastinal or hilar masses.   Mildly prominent axillary lymph nodes are noted none of which are pathologically enlarged.  Limited evaluation of the upper abdomen is unremarkable.  There is a mild dextroscoliosis of the lower thoracic spine. No osteoblastic or osteolytic lesions.  Review of the MIP images confirms the above findings.  IMPRESSION: 1. No evidence of a pulmonary embolus. 2. Mild patchy infiltrate in the left lower lobe and minimally in posterior inferior left upper lobe associated with mild left infrahilar and subcarinal reactive adenopathy.   Electronically Signed   By: Amie Portland M.D.   On: 06/11/2013 17:56    EKG Interpretation    Date/Time:  Monday June 11 2013 13:29:32 EST Ventricular Rate:  102 PR Interval:  130 QRS Duration: 80 QT Interval:  346 QTC Calculation: 450 R Axis:   36 Text Interpretation:  Sinus tachycardia Cannot rule out Anterior infarct , age undetermined Abnormal ECG Confirmed by DOCHERTY  MD, MEGAN [id number]) on 06/11/2013 4:03:17 PM            MDM   1. CAP (community acquired pneumonia)    Pt is a 47 y.o. female with Pmhx as above who presents with about 6 weeks of cough, R sided CP, SOB, subjective fever/chills.  She has been seen 3 times for similar including ED visit on 11/11/'14 where she had a negative CT chest.  She had CXR ordered by PCP 3 days ago (read as nml), but had not gotten results and reports feeling worse, now having sharp, intermittent midsternal CPs for past 3 days.  On PE, pt in NAD, tachycardic, but 100% on RA.  Lungs clear.  Minimal BLLE edema, no calf tenderness. Given new tachycardia, OCP use, new midsternal CP, I feel pt must be ruled out for PE w/ CTA chest.  Doubt ACS given neg trop, no ischemic changes of CXR, atypical symptoms.   CT negative for PE, but does have patchy infiltrate in LLL and LUL w/ reactive adenopathy.  Given worsening symptoms recently, I suspect CAP.  Have given pt pretreatment of PO benadryl & levaquin, which she tolerate w/o  incident.  Given stable BP, O2 sat, non-toxic appearance, I feel she is safe for outpt treatment. Return precautions given for new or worsening symptoms including worsening pain, trouble breathing.        Shanna Cisco, MD 06/11/13 2226

## 2013-06-19 ENCOUNTER — Encounter (HOSPITAL_COMMUNITY): Payer: Self-pay | Admitting: Emergency Medicine

## 2013-06-19 ENCOUNTER — Observation Stay (HOSPITAL_COMMUNITY)
Admission: EM | Admit: 2013-06-19 | Discharge: 2013-06-21 | Disposition: A | Discharge status: Home / Self Care | Payer: PRIVATE HEALTH INSURANCE | Attending: Internal Medicine | Admitting: Internal Medicine

## 2013-06-19 ENCOUNTER — Emergency Department (HOSPITAL_COMMUNITY): Payer: PRIVATE HEALTH INSURANCE

## 2013-06-19 DIAGNOSIS — R091 Pleurisy: Principal | ICD-10-CM | POA: Insufficient documentation

## 2013-06-19 DIAGNOSIS — K219 Gastro-esophageal reflux disease without esophagitis: Secondary | ICD-10-CM | POA: Insufficient documentation

## 2013-06-19 DIAGNOSIS — K029 Dental caries, unspecified: Secondary | ICD-10-CM

## 2013-06-19 DIAGNOSIS — R042 Hemoptysis: Secondary | ICD-10-CM | POA: Insufficient documentation

## 2013-06-19 DIAGNOSIS — M329 Systemic lupus erythematosus, unspecified: Secondary | ICD-10-CM

## 2013-06-19 DIAGNOSIS — L408 Other psoriasis: Secondary | ICD-10-CM | POA: Insufficient documentation

## 2013-06-19 DIAGNOSIS — N924 Excessive bleeding in the premenopausal period: Secondary | ICD-10-CM | POA: Insufficient documentation

## 2013-06-19 DIAGNOSIS — N1330 Unspecified hydronephrosis: Secondary | ICD-10-CM

## 2013-06-19 DIAGNOSIS — Z923 Personal history of irradiation: Secondary | ICD-10-CM | POA: Insufficient documentation

## 2013-06-19 DIAGNOSIS — Z7952 Long term (current) use of systemic steroids: Secondary | ICD-10-CM

## 2013-06-19 DIAGNOSIS — E139 Other specified diabetes mellitus without complications: Secondary | ICD-10-CM | POA: Insufficient documentation

## 2013-06-19 DIAGNOSIS — Z9221 Personal history of antineoplastic chemotherapy: Secondary | ICD-10-CM | POA: Insufficient documentation

## 2013-06-19 DIAGNOSIS — R0602 Shortness of breath: Secondary | ICD-10-CM | POA: Insufficient documentation

## 2013-06-19 DIAGNOSIS — L409 Psoriasis, unspecified: Secondary | ICD-10-CM

## 2013-06-19 DIAGNOSIS — T380X5A Adverse effect of glucocorticoids and synthetic analogues, initial encounter: Secondary | ICD-10-CM | POA: Insufficient documentation

## 2013-06-19 DIAGNOSIS — M069 Rheumatoid arthritis, unspecified: Secondary | ICD-10-CM | POA: Insufficient documentation

## 2013-06-19 DIAGNOSIS — D259 Leiomyoma of uterus, unspecified: Secondary | ICD-10-CM | POA: Insufficient documentation

## 2013-06-19 DIAGNOSIS — IMO0002 Reserved for concepts with insufficient information to code with codable children: Secondary | ICD-10-CM | POA: Insufficient documentation

## 2013-06-19 DIAGNOSIS — N200 Calculus of kidney: Secondary | ICD-10-CM

## 2013-06-19 DIAGNOSIS — R768 Other specified abnormal immunological findings in serum: Secondary | ICD-10-CM | POA: Diagnosis present

## 2013-06-19 DIAGNOSIS — N390 Urinary tract infection, site not specified: Secondary | ICD-10-CM

## 2013-06-19 DIAGNOSIS — E039 Hypothyroidism, unspecified: Secondary | ICD-10-CM | POA: Diagnosis present

## 2013-06-19 DIAGNOSIS — J40 Bronchitis, not specified as acute or chronic: Secondary | ICD-10-CM

## 2013-06-19 DIAGNOSIS — Z87891 Personal history of nicotine dependence: Secondary | ICD-10-CM | POA: Insufficient documentation

## 2013-06-19 DIAGNOSIS — Z85819 Personal history of malignant neoplasm of unspecified site of lip, oral cavity, and pharynx: Secondary | ICD-10-CM | POA: Insufficient documentation

## 2013-06-19 HISTORY — DX: Pleurisy: R09.1

## 2013-06-19 LAB — CBC WITH DIFFERENTIAL/PLATELET
Basophils Absolute: 0 10*3/uL (ref 0.0–0.1)
Eosinophils Absolute: 0.1 10*3/uL (ref 0.0–0.7)
HCT: 36.2 % (ref 36.0–46.0)
Lymphs Abs: 2.4 10*3/uL (ref 0.7–4.0)
MCH: 32.6 pg (ref 26.0–34.0)
MCHC: 34.8 g/dL (ref 30.0–36.0)
MCV: 93.8 fL (ref 78.0–100.0)
Monocytes Absolute: 0.9 10*3/uL (ref 0.1–1.0)
Monocytes Relative: 8 % (ref 3–12)
Neutro Abs: 8.2 10*3/uL — ABNORMAL HIGH (ref 1.7–7.7)
Platelets: 362 10*3/uL (ref 150–400)
RDW: 14.7 % (ref 11.5–15.5)
WBC: 11.6 10*3/uL — ABNORMAL HIGH (ref 4.0–10.5)

## 2013-06-19 LAB — BASIC METABOLIC PANEL
CO2: 23 mEq/L (ref 19–32)
Calcium: 8.6 mg/dL (ref 8.4–10.5)
Chloride: 99 mEq/L (ref 96–112)
Creatinine, Ser: 1.04 mg/dL (ref 0.50–1.10)
GFR calc Af Amer: 72 mL/min — ABNORMAL LOW (ref >90)
GFR calc non Af Amer: 62 mL/min — ABNORMAL LOW (ref >90)
Sodium: 138 mEq/L (ref 137–147)

## 2013-06-19 MED ORDER — ALBUTEROL SULFATE (2.5 MG/3ML) 0.083% IN NEBU
5.0000 mg | INHALATION_SOLUTION | Freq: Once | RESPIRATORY_TRACT | Status: AC
  Administered 2013-06-19: 5 mg via RESPIRATORY_TRACT
  Filled 2013-06-19: qty 6

## 2013-06-19 MED ORDER — METHYLPREDNISOLONE SODIUM SUCC 125 MG IJ SOLR
125.0000 mg | Freq: Once | INTRAMUSCULAR | Status: AC
  Administered 2013-06-19: 125 mg via INTRAVENOUS
  Filled 2013-06-19: qty 2

## 2013-06-19 NOTE — ED Notes (Signed)
Pt given Misty, ED directors phone number. Melissa, AD aware that pt and family are wanting to file a complaint regarding MD.

## 2013-06-19 NOTE — ED Notes (Signed)
Pt returned from X-ray.  

## 2013-06-19 NOTE — ED Notes (Signed)
MD at bedside. 

## 2013-06-19 NOTE — ED Notes (Signed)
Awaiting Hospitalist.  Pt cough continues otherwise in no visible distress.  VSS with O2 sat 100%.

## 2013-06-19 NOTE — ED Notes (Signed)
Reports productive cough tan phlegm.  Pain to right side of chest rated 8/10.

## 2013-06-19 NOTE — ED Provider Notes (Signed)
CSN: [id number]     Arrival date & time 06/19/13  1629 History   First MD Initiated Contact with Patient 06/19/13 1837     Chief Complaint  Patient presents with  . Hemoptysis  . Cough   (Consider location/radiation/quality/duration/timing/severity/associated sxs/prior Treatment) HPI Comments: Patient presents to the ER for evaluation of cough and difficulty breathing. Patient reports that she has been having problems for more than a month. She has been treated by her primary care doctor with Levaquin. She has been seen in the ER multiple times. She reports that initially x-rays were negative, but a recent CAT scan showed evidence of pneumonia. Since then, she has worsened rather than improved after treatment. Patient reports sharp pains in the right ribs that worsen when she coughs, breathes or moves. She denies injury.  Patient is a 47 y.o. female presenting with cough.  Cough Associated symptoms: chest pain   Associated symptoms: no fever     Past Medical History  Diagnosis Date  . Lupus   . Cancer     nasopharenx  . 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 Irregular menstrual cycle   . Papanicolaou smear of cervix with atypical squamous cells of undetermined significance (ASC-US)   . Excessive or frequent menstruation   . Leiomyoma of uterus, unspecified   . Trichimoniasis   . Psoriasis   . Headache(784.0)   . GERD (gastroesophageal reflux disease)   . Arthritis     ra, lupus  . Anxiety   . Hypothyroidism    Past Surgical History  Procedure Laterality Date  . Ankle arthroscopy    . Cesarean section    . Lymph node biopsy      neck  . Multiple extractions with alveoloplasty N/A 03/05/2013    Procedure: MULTIPLE EXTRACION WITH ALVEOLOPLASTY, extraction of decayed teeth numbers 3,4,5,18,19,22,23,24,25, extraction of retained root tips teeth numbers 2,20,21,26, bilateral mandibular alveoloplasty and upper right maxillary alveoloplasty;   Surgeon: Francene Finders, DDS;  Location: [facility] OR;  Service: Oral Surgery;  Laterality: N/A;  . Tooth extraction Bilateral 03/05/2013    Procedure: EXTRACTION MOLARS;  Surgeon: Francene Finders, DDS;  Location: [facility] OR;  Service: Oral Surgery;  Laterality: Bilateral;   Family History  Problem Relation Age of Onset  . Lupus Brother    History  Substance Use Topics  . Smoking status: Former Smoker    Quit date: 02/28/2004  . Smokeless tobacco: Never Used  . Alcohol Use: No   OB History   Grav Para Term Preterm Abortions TAB SAB Ect Mult Living                 Review of Systems  Constitutional: Negative for fever.  Respiratory: Positive for cough.   Cardiovascular: Positive for chest pain.  All other systems reviewed and are negative.    Allergies  Amoxicillin; Latex; Percocet; Plaquenil; Bactrim; Ciprofloxacin hcl; Keflex; Penicillins; and Tetracyclines & related  Home Medications   Current Outpatient Rx  Name  Route  Sig  Dispense  Refill  . albuterol (PROVENTIL HFA;VENTOLIN HFA) 108 (90 BASE) MCG/ACT inhaler   Inhalation   Inhale 2 puffs into the lungs every 6 (six) hours as needed. For breathing         . alendronate (FOSAMAX) 70 MG tablet   Oral   Take 70 mg by mouth once a week. Take with a full glass of water on an empty stomach.         Marland Kitchen  budesonide-formoterol (SYMBICORT) 160-4.5 MCG/ACT inhaler   Inhalation   Inhale 2 puffs into the lungs 2 (two) times daily.         . celecoxib (CELEBREX) 200 MG capsule   Oral   Take 200 mg by mouth daily.         . cyclobenzaprine (FLEXERIL) 10 MG tablet   Oral   Take 10 mg by mouth 2 (two) times daily as needed. For pain         . ferrous sulfate 325 (65 FE) MG tablet   Oral   Take 325 mg by mouth daily with breakfast.         . fexofenadine (ALLEGRA) 180 MG tablet   Oral   Take 180 mg by mouth daily.         Marland Kitchen FOLIC ACID PO   Oral   Take 2 mg by mouth daily.         . furosemide (LASIX)  80 MG tablet   Oral   Take 80 mg by mouth daily as needed for fluid.         Marland Kitchen gabapentin (NEURONTIN) 100 MG capsule   Oral   Take 100 mg by mouth 3 (three) times daily.          Marland Kitchen GLUCOSAMINE HCL PO   Oral   Take 1 tablet by mouth daily.         Marland Kitchen HYDROcodone-acetaminophen (NORCO) 10-325 MG per tablet   Oral   Take 1 tablet by mouth every 6 (six) hours as needed.   15 tablet   0   . hydrOXYzine (ATARAX/VISTARIL) 10 MG tablet   Oral   Take 10 mg by mouth at bedtime as needed for itching or anxiety.          Marland Kitchen levothyroxine (SYNTHROID, LEVOTHROID) 75 MCG tablet   Oral   Take 75 mcg by mouth daily.         Marland Kitchen LORazepam (ATIVAN) 1 MG tablet   Oral   Take 1 mg by mouth daily.         . methotrexate (RHEUMATREX) 2.5 MG tablet   Oral   Take 22.5 mg by mouth once a week. On mondays         . Multiple Vitamin (MULTIVITAMIN WITH MINERALS) TABS   Oral   Take 1 tablet by mouth daily.         . pantoprazole (PROTONIX) 40 MG tablet   Oral   Take 40 mg by mouth 2 (two) times daily.          . predniSONE (DELTASONE) 2.5 MG tablet   Oral   Take 2.5 mg by mouth every other day.         . topiramate (TOPAMAX) 100 MG tablet   Oral   Take 100 mg by mouth daily.         Marland Kitchen VITAMIN E PO   Oral   Take 800 mg by mouth daily.          . VOLTAREN 1 % GEL   Topical   Apply 4 g topically 3 (three) times daily after meals.           BP 105/64  Pulse 115  Temp(Src) 98.4 F (36.9 C) (Oral)  Resp 20  SpO2 100%  LMP 03/17/2013 Physical Exam  Constitutional: She is oriented to person, place, and time. She appears well-developed and well-nourished. She appears distressed.  HENT:  Head: Normocephalic and atraumatic.  Right  Ear: Hearing normal.  Left Ear: Hearing normal.  Nose: Nose normal.  Mouth/Throat: Oropharynx is clear and moist and mucous membranes are normal.  Eyes: Conjunctivae and EOM are normal. Pupils are equal, round, and reactive to light.    Neck: Normal range of motion. Neck supple.  Cardiovascular: Regular rhythm, S1 normal and S2 normal.  Exam reveals no gallop and no friction rub.   No murmur heard. Pulmonary/Chest: Effort normal. No respiratory distress. She has decreased breath sounds. She exhibits tenderness.    Abdominal: Soft. Normal appearance and bowel sounds are normal. There is no hepatosplenomegaly. There is no tenderness. There is no rebound, no guarding, no tenderness at McBurney's point and negative Murphy's sign. No hernia.  Musculoskeletal: Normal range of motion.  Neurological: She is alert and oriented to person, place, and time. She has normal strength. No cranial nerve deficit or sensory deficit. Coordination normal. GCS eye subscore is 4. GCS verbal subscore is 5. GCS motor subscore is 6.  Skin: Skin is warm, dry and intact. No rash noted. No cyanosis.  Psychiatric: She has a normal mood and affect. Her speech is normal and behavior is normal. Thought content normal.    ED Course  Procedures (including critical care time) Labs Review Labs Reviewed  CBC WITH DIFFERENTIAL - Abnormal; Notable for the following:    WBC 11.6 (*)    RBC 3.86 (*)    Neutro Abs 8.2 (*)    All other components within normal limits  BASIC METABOLIC PANEL - Abnormal; Notable for the following:    Potassium 3.3 (*)    Glucose, Bld 108 (*)    GFR calc non Af Amer 62 (*)    GFR calc Af Amer 72 (*)    All other components within normal limits   Imaging Review Dg Chest 2 View  06/19/2013   CLINICAL DATA:  Cough, right chest pain.  EXAM: CHEST  2 VIEW  COMPARISON:  06/08/2013  FINDINGS: Heart is upper limits normal in size. No confluent airspace opacities, effusions or edema. No acute bony abnormality.  IMPRESSION: No active cardiopulmonary disease.   Electronically Signed   By: Charlett Nose M.D.   On: 06/19/2013 19:57    EKG Interpretation   None       MDM   1. Pleurisy   2. Bronchitis    Patient presents to ER  with complaints of continued shortness of breath, cough, chest congestion. She is now experiencing sharp pains in the right side that worsen with coughing and movement. Review of records reveals that she did have a CT with IV contrast performed as well as CT angiography within the last 2 weeks. There was a left-sided infiltrate seen on CT, but this was not exactly explain the patient's right-sided pain today. It may, however, been the cause of the patient's increased cough and pulmonary symptoms, however. She was treated with Levaquin chest x-ray is clear today. Patient continues to have this as a coughing, shortness of breath and decreased exercise tolerance. She is not, however, hypoxic.  Patient has recently been tapered off of her prednisone. She takes this chronically for lupus.  Patient having significant paroxysms of coughing and shortness of breath. She continues to have sharp stabbing pains in the right chest wall which is reproducible. Patient has had 2 CTs in the last one to 2 weeks, I do not feel that this is likely to be PE and we'll not repeat. Patient was treated with appropriate antibiotic for her recent  pneumonia.  Patient's current symptoms might simply be bronchospasm and pleurisy secondary to her recent pneumonia. I do, however, has concern about the possibility of her lupus involving her long, she was recently tapered off of her prednisone. She has not had improvement with outpatient therapy. Restarted on Solu-Medrol, bronchodilators and will require hospitalization for further management.  Gilda Crease, MD 06/23/13 [phone]

## 2013-06-19 NOTE — ED Notes (Signed)
Pt in stating she was dx with pneumonia last week and has finished her antibiotics without improvement in her symptoms, states she is still coughing and feels bad, this morning noted she was coughing up blood

## 2013-06-19 NOTE — ED Notes (Signed)
Pt now receiving breathing treatment.  Complains they don't help and she's no better.  Mother at bedside, unhappy with treatment so far.  She feels pt needs to be hospitalized.  Plan is to discharge pt to home.

## 2013-06-19 NOTE — ED Notes (Signed)
Pt given ED directors

## 2013-06-20 ENCOUNTER — Encounter (HOSPITAL_COMMUNITY): Payer: Self-pay | Admitting: *Deleted

## 2013-06-20 DIAGNOSIS — IMO0002 Reserved for concepts with insufficient information to code with codable children: Secondary | ICD-10-CM

## 2013-06-20 DIAGNOSIS — L409 Psoriasis, unspecified: Secondary | ICD-10-CM | POA: Diagnosis present

## 2013-06-20 DIAGNOSIS — K219 Gastro-esophageal reflux disease without esophagitis: Secondary | ICD-10-CM | POA: Diagnosis present

## 2013-06-20 DIAGNOSIS — I519 Heart disease, unspecified: Secondary | ICD-10-CM

## 2013-06-20 DIAGNOSIS — E039 Hypothyroidism, unspecified: Secondary | ICD-10-CM | POA: Diagnosis present

## 2013-06-20 DIAGNOSIS — R0602 Shortness of breath: Secondary | ICD-10-CM

## 2013-06-20 DIAGNOSIS — L408 Other psoriasis: Secondary | ICD-10-CM

## 2013-06-20 DIAGNOSIS — R091 Pleurisy: Secondary | ICD-10-CM

## 2013-06-20 LAB — CBC
HCT: 36.2 % (ref 36.0–46.0)
Hemoglobin: 12.7 g/dL (ref 12.0–15.0)
MCH: 32.2 pg (ref 26.0–34.0)
MCHC: 35.1 g/dL (ref 30.0–36.0)
MCV: 91.9 fL (ref 78.0–100.0)
Platelets: 373 10*3/uL (ref 150–400)
RDW: 14.4 % (ref 11.5–15.5)
WBC: 10.9 10*3/uL — ABNORMAL HIGH (ref 4.0–10.5)

## 2013-06-20 LAB — COMPREHENSIVE METABOLIC PANEL
ALT: 21 U/L (ref 0–35)
AST: 31 U/L (ref 0–37)
Alkaline Phosphatase: 59 U/L (ref 39–117)
BUN: 12 mg/dL (ref 6–23)
CO2: 21 mEq/L (ref 19–32)
Chloride: 99 mEq/L (ref 96–112)
Creatinine, Ser: 0.95 mg/dL (ref 0.50–1.10)
GFR calc non Af Amer: 70 mL/min — ABNORMAL LOW (ref >90)
Potassium: 3.5 mEq/L — ABNORMAL LOW (ref 3.7–5.3)
Sodium: 138 mEq/L (ref 137–147)
Total Bilirubin: 0.2 mg/dL — ABNORMAL LOW (ref 0.3–1.2)
Total Protein: 7.9 g/dL (ref 6.0–8.3)

## 2013-06-20 LAB — GLUCOSE, CAPILLARY

## 2013-06-20 LAB — D-DIMER, QUANTITATIVE: D-Dimer, Quant: 0.94 ug/mL-FEU — ABNORMAL HIGH (ref 0.00–0.48)

## 2013-06-20 MED ORDER — CODEINE SULFATE 15 MG PO TABS
30.0000 mg | ORAL_TABLET | ORAL | Status: DC | PRN
  Administered 2013-06-20: 30 mg via ORAL
  Filled 2013-06-20: qty 2

## 2013-06-20 MED ORDER — BENZONATATE 100 MG PO CAPS
200.0000 mg | ORAL_CAPSULE | Freq: Three times a day (TID) | ORAL | Status: DC
  Administered 2013-06-20 – 2013-06-21 (×3): 200 mg via ORAL
  Filled 2013-06-20 (×5): qty 2

## 2013-06-20 MED ORDER — CODEINE SULFATE 15 MG PO TABS: 30.0000 mg | ORAL_TABLET | ORAL | Status: DC | PRN

## 2013-06-20 MED ORDER — HYDROXYZINE HCL 10 MG PO TABS
10.0000 mg | ORAL_TABLET | Freq: Every evening | ORAL | Status: DC | PRN
  Administered 2013-06-21: 10 mg via ORAL
  Filled 2013-06-20: qty 1

## 2013-06-20 MED ORDER — ALBUTEROL SULFATE HFA 108 (90 BASE) MCG/ACT IN AERS
2.0000 | INHALATION_SPRAY | Freq: Four times a day (QID) | RESPIRATORY_TRACT | Status: DC | PRN
  Filled 2013-06-20: qty 6.7

## 2013-06-20 MED ORDER — ONDANSETRON HCL 4 MG/2ML IJ SOLN: 4.0000 mg | Freq: Four times a day (QID) | INTRAMUSCULAR | Status: DC | PRN

## 2013-06-20 MED ORDER — ALBUTEROL SULFATE HFA 108 (90 BASE) MCG/ACT IN AERS
2.0000 | INHALATION_SPRAY | Freq: Four times a day (QID) | RESPIRATORY_TRACT | Status: DC
  Administered 2013-06-20: 2 via RESPIRATORY_TRACT

## 2013-06-20 MED ORDER — CYCLOBENZAPRINE HCL 10 MG PO TABS: 10.0000 mg | ORAL_TABLET | Freq: Two times a day (BID) | ORAL | Status: DC | PRN

## 2013-06-20 MED ORDER — ENOXAPARIN SODIUM 40 MG/0.4ML ~~LOC~~ SOLN
40.0000 mg | SUBCUTANEOUS | Status: DC
  Administered 2013-06-20: 40 mg via SUBCUTANEOUS
  Filled 2013-06-20 (×2): qty 0.4

## 2013-06-20 MED ORDER — HYDROMORPHONE HCL 4 MG PO TABS: 4.0000 mg | ORAL_TABLET | Freq: Four times a day (QID) | ORAL | Status: DC | PRN

## 2013-06-20 MED ORDER — LEVOTHYROXINE SODIUM 75 MCG PO TABS
75.0000 ug | ORAL_TABLET | Freq: Every day | ORAL | Status: DC
  Administered 2013-06-20 – 2013-06-21 (×2): 75 ug via ORAL
  Filled 2013-06-20 (×3): qty 1

## 2013-06-20 MED ORDER — TOPIRAMATE 100 MG PO TABS
100.0000 mg | ORAL_TABLET | Freq: Every day | ORAL | Status: DC
  Administered 2013-06-20 (×2): 100 mg via ORAL
  Filled 2013-06-20 (×3): qty 1

## 2013-06-20 MED ORDER — ONDANSETRON HCL 4 MG PO TABS: 4.0000 mg | ORAL_TABLET | Freq: Four times a day (QID) | ORAL | Status: DC | PRN

## 2013-06-20 MED ORDER — LIDOCAINE 5 % EX PTCH
1.0000 | MEDICATED_PATCH | CUTANEOUS | Status: DC
  Filled 2013-06-20 (×2): qty 1

## 2013-06-20 MED ORDER — ACETAMINOPHEN-CODEINE #3 300-30 MG PO TABS: 1.0000 | ORAL_TABLET | ORAL | Status: DC | PRN

## 2013-06-20 MED ORDER — GABAPENTIN 100 MG PO CAPS
100.0000 mg | ORAL_CAPSULE | Freq: Three times a day (TID) | ORAL | Status: DC
Start: 2013-06-20 — End: 2013-06-21
  Administered 2013-06-20 – 2013-06-21 (×3): 100 mg via ORAL
  Filled 2013-06-20 (×6): qty 1

## 2013-06-20 MED ORDER — CELECOXIB 200 MG PO CAPS
200.0000 mg | ORAL_CAPSULE | Freq: Every day | ORAL | Status: DC
  Administered 2013-06-20: 200 mg via ORAL
  Filled 2013-06-20 (×2): qty 1

## 2013-06-20 MED ORDER — FOLIC ACID 1 MG PO TABS
1.0000 mg | ORAL_TABLET | Freq: Every day | ORAL | Status: DC
  Filled 2013-06-20: qty 1

## 2013-06-20 MED ORDER — PREDNISONE 20 MG PO TABS
20.0000 mg | ORAL_TABLET | ORAL | Status: DC
  Administered 2013-06-20: 20 mg via ORAL
  Filled 2013-06-20: qty 1

## 2013-06-20 MED ORDER — BUDESONIDE-FORMOTEROL FUMARATE 160-4.5 MCG/ACT IN AERO
2.0000 | INHALATION_SPRAY | Freq: Two times a day (BID) | RESPIRATORY_TRACT | Status: DC
  Filled 2013-06-20: qty 6

## 2013-06-20 MED ORDER — POTASSIUM CHLORIDE CRYS ER 20 MEQ PO TBCR
40.0000 meq | EXTENDED_RELEASE_TABLET | Freq: Once | ORAL | Status: AC
  Administered 2013-06-20: 40 meq via ORAL
  Filled 2013-06-20: qty 2

## 2013-06-20 MED ORDER — ACETAMINOPHEN-CODEINE #3 300-30 MG PO TABS
1.0000 | ORAL_TABLET | ORAL | Status: DC | PRN
  Administered 2013-06-20: 1 via ORAL
  Filled 2013-06-20: qty 1

## 2013-06-20 MED ORDER — LORAZEPAM 1 MG PO TABS
1.0000 mg | ORAL_TABLET | Freq: Every day | ORAL | Status: DC
  Administered 2013-06-20 (×2): 1 mg via ORAL
  Filled 2013-06-20 (×2): qty 1

## 2013-06-20 MED ORDER — PREDNISONE 10 MG PO TABS: 20.0000 mg | ORAL_TABLET | Freq: Every day | ORAL | Status: DC

## 2013-06-20 MED ORDER — PANTOPRAZOLE SODIUM 40 MG PO TBEC
40.0000 mg | DELAYED_RELEASE_TABLET | Freq: Two times a day (BID) | ORAL | Status: DC
  Administered 2013-06-20 – 2013-06-21 (×4): 40 mg via ORAL
  Filled 2013-06-20 (×2): qty 1

## 2013-06-20 MED ORDER — BENZONATATE 100 MG PO CAPS
100.0000 mg | ORAL_CAPSULE | Freq: Three times a day (TID) | ORAL | Status: DC | PRN
  Administered 2013-06-20: 100 mg via ORAL
  Filled 2013-06-20: qty 1

## 2013-06-20 MED ORDER — HYDROXYZINE HCL 10 MG PO TABS
10.0000 mg | ORAL_TABLET | Freq: Once | ORAL | Status: AC
  Administered 2013-06-20: 10 mg via ORAL
  Filled 2013-06-20 (×2): qty 1

## 2013-06-20 MED ORDER — FOLIC ACID 1 MG PO TABS
2.0000 mg | ORAL_TABLET | Freq: Every day | ORAL | Status: DC
  Administered 2013-06-20 – 2013-06-21 (×2): 2 mg via ORAL
  Filled 2013-06-20 (×3): qty 2

## 2013-06-20 MED ORDER — ACETAMINOPHEN-CODEINE #4 300-60 MG PO TABS: 1.0000 | ORAL_TABLET | ORAL | Status: DC | PRN

## 2013-06-20 MED ORDER — SODIUM CHLORIDE 0.9 % IJ SOLN
3.0000 mL | Freq: Two times a day (BID) | INTRAMUSCULAR | Status: DC
  Administered 2013-06-20 (×2): 3 mL via INTRAVENOUS

## 2013-06-20 MED ORDER — CODEINE SULFATE 15 MG PO TABS
30.0000 mg | ORAL_TABLET | ORAL | Status: DC | PRN
  Administered 2013-06-20 – 2013-06-21 (×3): 30 mg via ORAL
  Filled 2013-06-20: qty 4
  Filled 2013-06-20 (×2): qty 2

## 2013-06-20 MED ORDER — BUDESONIDE-FORMOTEROL FUMARATE 160-4.5 MCG/ACT IN AERO
2.0000 | INHALATION_SPRAY | Freq: Two times a day (BID) | RESPIRATORY_TRACT | Status: DC
  Administered 2013-06-20 – 2013-06-21 (×4): 2 via RESPIRATORY_TRACT
  Filled 2013-06-20: qty 6

## 2013-06-20 MED ORDER — ACETAMINOPHEN-CODEINE #3 300-30 MG PO TABS
1.0000 | ORAL_TABLET | ORAL | Status: DC | PRN
  Administered 2013-06-20 – 2013-06-21 (×3): 1 via ORAL
  Filled 2013-06-20 (×2): qty 1
  Filled 2013-06-20: qty 2
  Filled 2013-06-20: qty 1

## 2013-06-20 MED ORDER — INDOMETHACIN 50 MG PO CAPS
50.0000 mg | ORAL_CAPSULE | Freq: Two times a day (BID) | ORAL | Status: DC
  Administered 2013-06-20 – 2013-06-21 (×3): 50 mg via ORAL
  Filled 2013-06-20 (×5): qty 1

## 2013-06-20 MED ORDER — FERROUS SULFATE 325 (65 FE) MG PO TABS
325.0000 mg | ORAL_TABLET | Freq: Every day | ORAL | Status: DC
  Administered 2013-06-20 – 2013-06-21 (×2): 325 mg via ORAL
  Filled 2013-06-20 (×3): qty 1

## 2013-06-20 MED ORDER — POLYETHYLENE GLYCOL 3350 17 G PO PACK
17.0000 g | PACK | Freq: Every day | ORAL | Status: DC
  Administered 2013-06-21: 17 g via ORAL
  Filled 2013-06-20 (×2): qty 1

## 2013-06-20 NOTE — Progress Notes (Signed)
*  Preliminary Results* Bilateral lower extremity venous duplex completed. Bilateral lower extremities are negative for deep vein thrombosis. There is no evidence of Baker's cyst bilaterally.  06/20/2013  Gertie Fey, RVT, RDCS, RDMS

## 2013-06-20 NOTE — H&P (Signed)
[facility] History and Physical  Patient: [name]  JXB:[id number]  DOB: Feb 02, 1966  DOS: the patient was seen and examined on 06/20/2013 PCP: Dorrene German, MD  Chief Complaint: Cough  HPI: [name] is a 47 y.o. female with Past medical history of pupils, psoriasis, chronic steroid use, GERD, hypothyroidism. The patient is coming from home. The patient is presenting with complaints of chest pain on the right side of the wall as well as cough. She mentions since October and she has been having this complain of right-sided chest pain at which time she was presented in the ED and was treated for a possible rash with nsaids. Later on she presented with cough and chest pain and has undergone an x-ray as well as a CT scan in November at which time she was told that she has pleurisy and was treated for the same with nsaids. Later the in December she presented with similar complaints at which time a repeat CT scan did show that she could have a possible left sided infiltrate for which she was placed on antibiotics. She has been treated with 2 courses of antibiotics so far on clindamycin and 1 levofloxacin. Despite all this treatment her symptoms have not been getting better and her symptoms are now also associated with shortness of breath. She also complains of on-and-off wheezing. The cough that she has is dry cough without any expectoration. She denies any fever or chills nausea or vomiting abdominal pain diarrhea or constipation dizziness active bleeding leg swelling. She mentions her lupus is stable and she has been taking her methotrexate which she took yesterday. Her rheumatologist on is tapering her off prednisone which she has been taking since last 8 years.  Review of Systems: as mentioned in the history of present illness.  A Comprehensive review of the other systems is negative.  Past Medical History  Diagnosis Date  . Lupus   . Cancer     nasopharenx  .  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 Irregular menstrual cycle   . Papanicolaou smear of cervix with atypical squamous cells of undetermined significance (ASC-US)   . Excessive or frequent menstruation   . Leiomyoma of uterus, unspecified   . Trichimoniasis   . Psoriasis   . Headache(784.0)   . GERD (gastroesophageal reflux disease)   . Arthritis     ra, lupus  . Anxiety   . Hypothyroidism    Past Surgical History  Procedure Laterality Date  . Ankle arthroscopy    . Cesarean section    . Lymph node biopsy      neck  . Multiple extractions with alveoloplasty N/A 03/05/2013    Procedure: MULTIPLE EXTRACION WITH ALVEOLOPLASTY, extraction of decayed teeth numbers 3,4,5,18,19,22,23,24,25, extraction of retained root tips teeth numbers 2,20,21,26, bilateral mandibular alveoloplasty and upper right maxillary alveoloplasty;  Surgeon: Francene Finders, DDS;  Location: [facility] OR;  Service: Oral Surgery;  Laterality: N/A;  . Tooth extraction Bilateral 03/05/2013    Procedure: EXTRACTION MOLARS;  Surgeon: Francene Finders, DDS;  Location: [facility] OR;  Service: Oral Surgery;  Laterality: Bilateral;   Social History:  reports that she quit smoking about 9 years ago. She has never used smokeless tobacco. She reports that she does not drink alcohol or use illicit drugs. Independent for most of her  ADL.  Allergies  Allergen Reactions  . Amoxicillin Itching  . Latex Itching  . Percocet [Oxycodone-Acetaminophen]   . Plaquenil [Hydroxychloroquine  Sulfate]   . Bactrim [Sulfamethoxazole-Trimethoprim] Rash  . Ciprofloxacin Hcl Rash  . Keflex [Cephalexin] Rash  . Penicillins Rash  . Tetracyclines & Related Rash    Family History  Problem Relation Age of Onset  . Lupus Brother     Prior to Admission medications   Medication Sig Start Date End Date Taking? Authorizing Provider  albuterol (PROVENTIL HFA;VENTOLIN HFA) 108 (90 BASE) MCG/ACT inhaler Inhale 2 puffs  into the lungs every 6 (six) hours as needed. For breathing   Yes Historical Provider, MD  alendronate (FOSAMAX) 70 MG tablet Take 70 mg by mouth once a week. Take with a full glass of water on an empty stomach.   Yes Historical Provider, MD  budesonide-formoterol (SYMBICORT) 160-4.5 MCG/ACT inhaler Inhale 2 puffs into the lungs 2 (two) times daily.   Yes Historical Provider, MD  celecoxib (CELEBREX) 200 MG capsule Take 200 mg by mouth daily.   Yes Historical Provider, MD  cyclobenzaprine (FLEXERIL) 10 MG tablet Take 10 mg by mouth 2 (two) times daily as needed. For pain   Yes Historical Provider, MD  ferrous sulfate 325 (65 FE) MG tablet Take 325 mg by mouth daily with breakfast.   Yes Historical Provider, MD  fexofenadine (ALLEGRA) 180 MG tablet Take 180 mg by mouth daily.   Yes Historical Provider, MD  FOLIC ACID PO Take 2 mg by mouth daily.   Yes Historical Provider, MD  furosemide (LASIX) 80 MG tablet Take 80 mg by mouth daily as needed for fluid.   Yes Historical Provider, MD  gabapentin (NEURONTIN) 100 MG capsule Take 100 mg by mouth 3 (three) times daily.    Yes Historical Provider, MD  GLUCOSAMINE HCL PO Take 1 tablet by mouth daily.   Yes Historical Provider, MD  HYDROcodone-acetaminophen (NORCO) 10-325 MG per tablet Take 1 tablet by mouth every 6 (six) hours as needed. 06/11/13  Yes Shanna Cisco, MD  hydrOXYzine (ATARAX/VISTARIL) 10 MG tablet Take 10 mg by mouth at bedtime as needed for itching or anxiety.    Yes Historical Provider, MD  levothyroxine (SYNTHROID, LEVOTHROID) 75 MCG tablet Take 75 mcg by mouth daily.   Yes Historical Provider, MD  LORazepam (ATIVAN) 1 MG tablet Take 1 mg by mouth daily.   Yes Historical Provider, MD  methotrexate (RHEUMATREX) 2.5 MG tablet Take 22.5 mg by mouth once a week. On mondays   Yes Historical Provider, MD  Multiple Vitamin (MULTIVITAMIN WITH MINERALS) TABS Take 1 tablet by mouth daily.   Yes Historical Provider, MD  pantoprazole (PROTONIX) 40  MG tablet Take 40 mg by mouth 2 (two) times daily.    Yes Historical Provider, MD  predniSONE (DELTASONE) 2.5 MG tablet Take 2.5 mg by mouth every other day.   Yes Historical Provider, MD  topiramate (TOPAMAX) 100 MG tablet Take 100 mg by mouth daily.   Yes Historical Provider, MD  VITAMIN E PO Take 800 mg by mouth daily.    Yes Historical Provider, MD  VOLTAREN 1 % GEL Apply 4 g topically 3 (three) times daily after meals.  05/26/13  Yes Historical Provider, MD    Physical Exam: Filed Vitals:   06/19/13 2310 06/19/13 2330 06/20/13 0000 06/20/13 0041  BP: 105/64 114/60 116/70 128/81  Pulse: 115 105 96 112  Temp: 98.4 F (36.9 C)   98.1 F (36.7 C)  TempSrc: Oral   Oral  Resp: 20   20  Height:    5\' 6"  (1.676 m)  Weight:  93.8 kg (206 lb 12.7 oz)  SpO2: 100% 98% 97% 100%    General: Alert, Awake and Oriented to Time, Place and Person. Appear in moderate distress Eyes: PERRL ENT: Oral Mucosa clear moist. Neck: No JVD Cardiovascular: S1 and S2 Present, no Murmur, Peripheral Pulses Present Respiratory: Bilateral Air entry equal and Decreased, Clear to Auscultation,  No Crackles, no wheezes Abdomen: Bowel Sound Present, Soft and Non tender Skin: No Rash Extremities: No Pedal edema, no calf tenderness Neurologic: Grossly Unremarkable.  Labs on Admission:  CBC:  Recent Labs Lab 06/19/13 2115  WBC 11.6*  NEUTROABS 8.2*  HGB 12.6  HCT 36.2  MCV 93.8  PLT 362    CMP     Component Value Date/Time   NA 138 06/19/2013 2115   K 3.3* 06/19/2013 2115   CL 99 06/19/2013 2115   CO2 23 06/19/2013 2115   GLUCOSE 108* 06/19/2013 2115   BUN 12 06/19/2013 2115   CREATININE 1.04 06/19/2013 2115   CALCIUM 8.6 06/19/2013 2115   PROT 9.0* 04/04/2012 2249   ALBUMIN 4.0 04/04/2012 2249   AST 20 04/04/2012 2249   ALT 9 04/04/2012 2249   ALKPHOS 67 04/04/2012 2249   BILITOT 0.4 04/04/2012 2249   GFRNONAA 62* 06/19/2013 2115   GFRAA 72* 06/19/2013 2115    No results found for  this basename: LIPASE, AMYLASE,  in the last 168 hours No results found for this basename: AMMONIA,  in the last 168 hours  No results found for this basename: CKTOTAL, CKMB, CKMBINDEX, TROPONINI,  in the last 168 hours BNP (last 3 results)  Recent Labs  05/01/13 1149  PROBNP 331.3*    Radiological Exams on Admission: Dg Chest 2 View  06/19/2013   CLINICAL DATA:  Cough, right chest pain.  EXAM: CHEST  2 VIEW  COMPARISON:  06/08/2013  FINDINGS: Heart is upper limits normal in size. No confluent airspace opacities, effusions or edema. No acute bony abnormality.  IMPRESSION: No active cardiopulmonary disease.   Electronically Signed   By: Charlett Nose M.D.   On: 06/19/2013 19:57    EKG: Independently reviewed. normal sinus rhythm, nonspecific ST and T waves changes.  Assessment/Plan Principal Problem:   Pleurisy Active Problems:   Chronic use of steroids   Lupus (systemic lupus erythematosus)   Psoriasis   GERD (gastroesophageal reflux disease)   Hypothyroidism   1. Pleurisy The patient is presenting with complaints of right-sided pleuritic chest pain. She has been having this complaint since last 2 months. Has undergone 2 repeated CT scan MG a to rule out PE which has been negative for any PE. Has been treated with oral antibiotics for 2 courses for possible pneumonia and bronchitis. Has been treated with multiple medications including Toradol, hydrocodone Norco. At present I will put her on increasing dose of prednisone 20 mg daily. I would also put her on indomethacin 50 mg twice a day with meals. I will check an ESR and CRP in the morning. I would also give her a Lidoderm patch at the local site. For her dry cough I will give her Tylenol #4  2. lupus and psoriasis and chronic use of steroids At present it seems stable other than her pleurisy for which I will increase her steroids  3. GERD Continue Protonix  4. Hypothyroidism Continue Synthroid  DVT Prophylaxis:  subcutaneous Heparin Nutrition: Diet as tolerated  Code Status: Full  Disposition: Admitted to observation in telemetry unit.  Author: Lynden Oxford, MD [facility] Pager: [phone]  06/20/2013, 2:39 AM    If 7PM-7AM, please contact night-coverage [email or web address] Password [email or web address]

## 2013-06-20 NOTE — Care Management Note (Signed)
    Page 1 of 1   06/20/2013     2:53:12 PM   CARE MANAGEMENT NOTE 06/20/2013  Patient:  [name], [name]   Account Number:  [id number]  Date Initiated:  06/20/2013  Documentation initiated by:  Khushi Zupko  Subjective/Objective Assessment:   PT ADM ON 12/30 WITH PLEURISY, COUGH.  PTA, PT INDEPENDENT, LIVES WITH SON.     Action/Plan:   WILL FOLLOW FOR DISCHARGE NEEDS AS PT PROGRESSES.   Anticipated DC Date:  06/20/2013   Anticipated DC Plan:  HOME/SELF CARE      DC Planning Services  CM consult      Choice offered to / List presented to:             Status of service:  Completed, signed off Medicare Important Message given?   (If response is "NO", the following Medicare IM given date fields will be blank) Date Medicare IM given:   Date Additional Medicare IM given:    Discharge Disposition:  HOME/SELF CARE  Per UR Regulation:  Reviewed for med. necessity/level of care/duration of stay  If discussed at Long Length of Stay Meetings, dates discussed:    Comments:  06/20/13 Tarra Pence,RN,BSN [phone] [facility] SCREENED PT FOR ELIGIBILITY, BUT SHE IS NOT APPROPRIATE FOR THEIR SERVICES, PER [facility] LIASION.

## 2013-06-20 NOTE — Progress Notes (Signed)
[facility] Lab  2D Echocardiogram completed.  Iona Stay L Alexica Schlossberg, RDCS 06/20/2013 2:53 PM

## 2013-06-20 NOTE — Progress Notes (Addendum)
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 Mohamed and [place], lupus and psoriasis (rheumatologist is Dr. Nickola Major).   Presents with two months of dyspnea and cough.  Her shortness of breath is unchanged at rest vs with mobility.  She has taken courses of clinda and levaquin without improvement.  She has been tapering down her chronic prednisone for 6 months.  She mentions that Monday she had an episode of hemoptysis.   SOB with cough CT Angio completed 12/22 shows no PE Discussed via telephone with Dr. Vassie Loll.  He was unimpressed with ground glass opacity on CT & recommended OP follow up. Appointment scheduled with Dr. Sherene Sires. Patient being treated without antibiotics. Slight improvement on steroids. Patient stable and can be treated as outpatient. Will check 2D echo and BNP before she goes.  Lupus Per Dr. Nickola Major Outpatient On prednisone.  She received solumedrol in ED and admitted increased her PO dose to 20mg  daily.  Psoriasis Notable on skin but appears controlled. Prednisone.  Hypothyroidism Synthroid  Code Status: Full        DVT Prophylaxis:  heparin  Family Communication: son at bedside Disposition Plan: currently observation.  to home when appropriate.   Consultants:  [facility] Pulm, Dr. Vassie Loll.  Patient will be seen as an outpatient  Procedures:  2d echo pending  Antibiotics:    HPI/Subjective: Patient describes SOB and non productive cough since October 29.  She describes 1 episode of  hemoptysis on Monday 12/29.  Objective: Filed Vitals:   06/20/13 0000 06/20/13 0041 06/20/13 0608 06/20/13 0803  BP: 116/70 128/81 111/78   Pulse: 96 112 106   Temp:  98.1 F (36.7 C) 97.9 F (36.6 C)   TempSrc:  Oral Oral   Resp:  20 20   Height:  5\' 6"  (1.676 m)     Weight:  93.8 kg (206 lb 12.7 oz) 93.3 kg (205 lb 11 oz)   SpO2: 97% 100% 100% 98%    Intake/Output Summary (Last 24 hours) at 06/20/13 1229 Last data filed at 06/20/13 0100  Gross per 24 hour  Intake    240 ml  Output      0 ml  Net    240 ml   Filed Weights   06/20/13 0041 06/20/13 2956  Weight: 93.8 kg (206 lb 12.7 oz) 93.3 kg (205 lb 11 oz)    Exam: General: Well developed, well nourished, NAD, appears stated age  HEENT:  PERR, EOMI, Anicteic Sclera, MMM. No pharyngeal erythema or exudates  Neck: Supple, no JVD, firmness on left side of anterior neck. Cardiovascular: RRR, S1 S2 auscultated, no rubs, murmurs or gallops.   Respiratory: Clear to auscultation bilaterally with equal chest rise  Abdomen: Soft, nontender, nondistended, + bowel sounds  Extremities: warm dry without cyanosis clubbing or edema.  Neuro: AAOx3, cranial nerves grossly intact. Strength 5/5 in upper and lower extremities  Skin: patches of psoriasis evident on arms Psych: Normal affect and demeanor with intact judgement and insight       Data Reviewed: Basic Metabolic Panel:  Recent Labs Lab 06/19/13 2115 06/20/13 0558  NA 138 138  K 3.3* 3.5*  CL 99 99  CO2 23 21  GLUCOSE 108* 146*  BUN 12 12  CREATININE 1.04 0.95  CALCIUM 8.6 8.7   Liver Function Tests:  Recent Labs  Lab 06/20/13 0558  AST 31  ALT 21  ALKPHOS 59  BILITOT 0.2*  PROT 7.9  ALBUMIN 3.5   CBC:  Recent Labs Lab 06/19/13 2115 06/20/13 0558  WBC 11.6* 10.9*  NEUTROABS 8.2*  --   HGB 12.6 12.7  HCT 36.2 36.2  MCV 93.8 91.9  PLT 362 373   BNP (last 3 results)  Recent Labs  05/01/13 1149  PROBNP 331.3*     Studies: Dg Chest 2 View  06/19/2013   CLINICAL DATA:  Cough, right chest pain.  EXAM: CHEST  2 VIEW  COMPARISON:  06/08/2013  FINDINGS: Heart is upper limits normal in size. No confluent airspace opacities, effusions or edema. No acute bony abnormality.  IMPRESSION: No active cardiopulmonary  disease.   Electronically Signed   By: Charlett Nose M.D.   On: 06/19/2013 19:57    Scheduled Meds: . budesonide-formoterol  2 puff Inhalation BID  . celecoxib  200 mg Oral Daily  . enoxaparin (LOVENOX) injection  40 mg Subcutaneous Q24H  . ferrous sulfate  325 mg Oral Q breakfast  . folic acid  2 mg Oral Daily  . gabapentin  100 mg Oral TID  . indomethacin  50 mg Oral BID WC  . levothyroxine  75 mcg Oral QAC breakfast  . lidocaine  1 patch Transdermal Q24H  . LORazepam  1 mg Oral Daily  . pantoprazole  40 mg Oral BID  . polyethylene glycol  17 g Oral Daily  . predniSONE  20 mg Oral QODAY  . sodium chloride  3 mL Intravenous Q12H  . topiramate  100 mg Oral Daily   Continuous Infusions:   Principal Problem:   Pleurisy Active Problems:   Chronic use of steroids   Lupus (systemic lupus erythematosus)   Psoriasis   GERD (gastroesophageal reflux disease)   Hypothyroidism    Conley Canal  [facility] Pager [phone]. If 7PM-7AM, please contact night-coverage at [email or web address], password [facility] 06/20/2013, 12:29 PM  LOS: 1 day   Pt interviewed and examined. Patient and family upset.  Await echo and discharge in am with oupatient pulmonary f/u.  Crista Curb, M.D.

## 2013-06-20 NOTE — Progress Notes (Signed)
Pt was ambulated on R/A.  Resting the Pt was 100% O2 Sat on R/A and HR was 90.  Ambulating, pt was 98% on R/A and HR was between 145-150 bpm.

## 2013-06-20 NOTE — Discharge Summary (Signed)
Physician Discharge Summary  [name] ZOX:[id number] DOB: 03-Feb-1966 DOA: 06/19/2013  PCP: Dorrene German, MD  Admit date: 06/19/2013 Discharge date: 06/21/2012  Time spent: 60 minutes  Patient was unhappy that her chronic symptoms were not resolved prior to discharge.  She complained that the admitting doctor told her that her pleurisy would be resolved before discharge.   Recommendations for Outpatient Follow-up:  Follow up with PCP in 1 week.  C Appt with Dr. Sherene Sires 06/29/12 for follow up - dyspnea, chronic cough, min. Hemoptysis. Follow up with Dr. Nickola Major as previously scheduled on 07/12/13 for lupus.  Discharge Diagnoses:  Principal Problem:   Pleurisy Active Problems:   Chronic use of steroids   Lupus (systemic lupus erythematosus)   Psoriasis   GERD (gastroesophageal reflux disease)   Hypothyroidism   Discharge Condition: stable, still with dyspnea and cough.  Condition is chronic and stable for outpatient follow up.  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 Mohamed and [place], lupus and psoriasis (rheumatologist is Dr. Nickola Major). Presents with two months of dyspnea and cough. Her shortness of breath is unchanged at rest vs with mobility. She has taken courses of clinda and levaquin without improvement. She has been tapering down her chronic prednisone for 6 months. She mentions that Monday she had an episode of hemoptysis.  Hospital Course:  SOB with cough  CT Angio completed 12/22 shows no PE  Discussed via telephone with Dr. Vassie Loll. He was unimpressed with ground glass opacity on CT & recommended OP follow up.  Appointment scheduled with Dr. Sherene Sires.  Patient being treated without antibiotics.  Slight improvement on steroids.  Patient stable  and can be treated as outpatient.  Will check 2D echo and BNP before she goes.   Results to be followed outpatient.  Lupus  Per Dr. Nickola Major Outpatient  On prednisone. She received solumedrol in ED and admitted increased her PO dose to 20mg  daily.   Psoriasis  Notable on skin but appears controlled.  Prednisone.   Hypothyroidism  Synthroid   Procedures:  2D echo  Consultations:  Curb sided Dr. Vassie Loll, pulmonary  Discharge Exam: Filed Vitals:   06/20/13 0608  BP: 111/78  Pulse: 106  Temp: 97.9 F (36.6 C)  Resp: 20   General: Well developed, well nourished, NAD, appears stated age  HEENT: PERR, EOMI, Anicteic Sclera, MMM. No pharyngeal erythema or exudates  Neck: Supple, no JVD, firmness on left side of anterior neck.  Cardiovascular: RRR, S1 S2 auscultated, no rubs, murmurs or gallops.  Respiratory: Clear to auscultation bilaterally with equal chest rise  Abdomen: Soft, nontender, nondistended, + bowel sounds  Extremities: warm dry without cyanosis clubbing or edema.  Neuro: AAOx3, cranial nerves grossly intact. Strength 5/5 in upper and lower extremities  Skin: patches of psoriasis evident on arms  Psych: Normal affect and demeanor with intact judgement and insight  Discharge Instructions      Discharge Orders   Future Appointments Provider Department Dept Phone   06/29/2013 11:00 AM Nyoka Cowden, MD [facility] [phone]   11/26/2013 11:15 AM Antionette Char, MD [facility] [phone]   Future Orders Complete By Expires   Diet - low sodium heart healthy  As directed    Increase  activity slowly  As directed        Medication List    STOP taking these medications       celecoxib 200 MG capsule  Commonly known as:  CELEBREX     HYDROcodone-acetaminophen 10-325 MG per tablet  Commonly known as:  NORCO      TAKE these medications       albuterol 108 (90 BASE) MCG/ACT inhaler  Commonly known as:  PROVENTIL HFA;VENTOLIN HFA   Inhale 2 puffs into the lungs every 6 (six) hours as needed. For breathing     alendronate 70 MG tablet  Commonly known as:  FOSAMAX  Take 70 mg by mouth once a week. Take with a full glass of water on an empty stomach.     budesonide-formoterol 160-4.5 MCG/ACT inhaler  Commonly known as:  SYMBICORT  Inhale 2 puffs into the lungs 2 (two) times daily.     cyclobenzaprine 10 MG tablet  Commonly known as:  FLEXERIL  Take 10 mg by mouth 2 (two) times daily as needed. For pain     ferrous sulfate 325 (65 FE) MG tablet  Take 325 mg by mouth daily with breakfast.     fexofenadine 180 MG tablet  Commonly known as:  ALLEGRA  Take 180 mg by mouth daily.     FOLIC ACID PO  Take 2 mg by mouth daily.     furosemide 80 MG tablet  Commonly known as:  LASIX  Take 80 mg by mouth daily as needed for fluid.     gabapentin 100 MG capsule  Commonly known as:  NEURONTIN  Take 100 mg by mouth 3 (three) times daily.     GLUCOSAMINE HCL PO  Take 1 tablet by mouth daily.     HYDROmorphone 4 MG tablet  Commonly known as:  DILAUDID  Take 1 tablet (4 mg total) by mouth every 6 (six) hours as needed for severe pain (Take only if you are having severe pain.  Do not take with Norco.).     hydrOXYzine 10 MG tablet  Commonly known as:  ATARAX/VISTARIL  Take 10 mg by mouth at bedtime as needed for itching or anxiety.     levothyroxine 75 MCG tablet  Commonly known as:  SYNTHROID, LEVOTHROID  Take 75 mcg by mouth daily.     LORazepam 1 MG tablet  Commonly known as:  ATIVAN  Take 1 mg by mouth daily.     methotrexate 2.5 MG tablet  Commonly known as:  RHEUMATREX  Take 22.5 mg by mouth once a week. On mondays     multivitamin with minerals Tabs tablet  Take 1 tablet by mouth daily.     pantoprazole 40 MG tablet  Commonly known as:  PROTONIX  Take 40 mg by mouth 2 (two) times daily.     predniSONE 10 MG tablet  Commonly known as:  DELTASONE  Take 2 tablets (20 mg total) by mouth daily with  breakfast. Take two tablets daily for 5 days.  Then take 1 tablet daily until you see Dr. Nickola Major on 07/12/13.     topiramate 100 MG tablet  Commonly known as:  TOPAMAX  Take 100 mg by mouth daily.     VITAMIN E PO  Take 800 mg by mouth daily.     VOLTAREN 1 % Gel  Generic drug:  diclofenac sodium  Apply 4 g topically 3 (three) times daily after meals.       Allergies  Allergen Reactions  .  Amoxicillin Itching  . Latex Itching  . Percocet [Oxycodone-Acetaminophen]   . Plaquenil [Hydroxychloroquine Sulfate]   . Bactrim [Sulfamethoxazole-Trimethoprim] Rash  . Ciprofloxacin Hcl Rash  . Keflex [Cephalexin] Rash  . Penicillins Rash  . Tetracyclines & Related Rash   Follow-up Information   Follow up with Sandrea Hughs, MD On 06/29/2013. (11:00   pulmonary / lung doctor.)    Specialty:  Pulmonary Disease   Contact information:   [address] [place] [address] [phone]       Follow up with Fleet Contras A, MD. Schedule an appointment as soon as possible for a visit in 1 week.   Specialty:  Internal Medicine   Contact information:   [address] [place] [address] [phone]        The results of significant diagnostics from this hospitalization (including imaging, microbiology, ancillary and laboratory) are listed below for reference.    Significant Diagnostic Studies: Dg Chest 2 View  06/19/2013   CLINICAL DATA:  Cough, right chest pain.  EXAM: CHEST  2 VIEW  COMPARISON:  06/08/2013  FINDINGS: Heart is upper limits normal in size. No confluent airspace opacities, effusions or edema. No acute bony abnormality.  IMPRESSION: No active cardiopulmonary disease.   Electronically Signed   By: Charlett Nose M.D.   On: 06/19/2013 19:57   Dg Chest 2 View  06/08/2013   CLINICAL DATA:  Cough.  EXAM: CHEST  2 VIEW  COMPARISON:  None.  FINDINGS: The heart size and mediastinal contours are within normal limits. Both lungs are clear. The visualized skeletal structures  are unremarkable.  IMPRESSION: No active cardiopulmonary disease.  No change from 04/24/2013.   Electronically Signed   By: Maisie Fus  Register   On: 06/08/2013 13:16   Ct Angio Chest Pe W/cm &/or Wo Cm  06/11/2013   CLINICAL DATA:  Short of breath and cough for 1 week. Chest pain for 2 days.  EXAM: CT ANGIOGRAPHY CHEST WITH CONTRAST  TECHNIQUE: Multidetector CT imaging of the chest was performed using the standard protocol during bolus administration of intravenous contrast. Multiplanar CT image reconstructions including MIPs were obtained to evaluate the vascular anatomy.  CONTRAST:  OMNIPAQUE IOHEXOL 350 MG/ML SOLN  COMPARISON:  Chest CT, 05/01/2013  FINDINGS: No evidence of a pulmonary embolus.  There are patchy areas of confluent and ground-glass type opacity in the left lower lobe and minimally in the dependent, inferior left upper lobe. The lungs are otherwise clear. No pleural effusions.  The heart is mildly enlarged. The great vessels are normal in caliber. No aortic dissection. There is a 12 mm short axis subcarinal lymph node and a 1 cm short axis left infrahilar lymph node, both likely reactive. No mediastinal or hilar masses.  Mildly prominent axillary lymph nodes are noted none of which are pathologically enlarged.  Limited evaluation of the upper abdomen is unremarkable.  There is a mild dextroscoliosis of the lower thoracic spine. No osteoblastic or osteolytic lesions.  Review of the MIP images confirms the above findings.  IMPRESSION: 1. No evidence of a pulmonary embolus. 2. Mild patchy infiltrate in the left lower lobe and minimally in posterior inferior left upper lobe associated with mild left infrahilar and subcarinal reactive adenopathy.   Electronically Signed   By: Amie Portland M.D.   On: 06/11/2013 17:56   Left ventricle: The cavity size was normal. Wall thickness was normal. Systolic function was normal. The estimated ejection fraction was in the range of 55% to 60%.  Although no diagnostic regional wall motion abnormality was identified, this possibility cannot be completely excluded on the basis of this study. Doppler parameters are consistent with abnormal left ventricular relaxation (grade 1 diastolic dysfunction). - Aortic valve: There was no stenosis. - Right ventricle: The cavity size was normal. Systolic function was normal. - Pulmonary arteries: No complete TR doppler jet so unable to estimate PA systolic pressure. - Inferior vena cava: The vessel was normal in size; the respirophasic diameter changes were in the normal range (= 50%); findings are consistent with normal central venous pressure. Impressions:  - Normal LV size and systolic function, EF 55-60%. Normal RV size and systolic function. No significant valvular Abnormalities.  Labs: Basic Metabolic Panel:  Recent Labs Lab 06/19/13 2115 06/20/13 0558  NA 138 138  K 3.3* 3.5*  CL 99 99  CO2 23 21  GLUCOSE 108* 146*  BUN 12 12  CREATININE 1.04 0.95  CALCIUM 8.6 8.7   Liver Function Tests:  Recent Labs Lab 06/20/13 0558  AST 31  ALT 21  ALKPHOS 59  BILITOT 0.2*  PROT 7.9  ALBUMIN 3.5   CBC:  Recent Labs Lab 06/19/13 2115 06/20/13 0558  WBC 11.6* 10.9*  NEUTROABS 8.2*  --   HGB 12.6 12.7  HCT 36.2 36.2  MCV 93.8 91.9  PLT 362 373   BNP: BNP (last 3 results)  Recent Labs  05/01/13 1149  PROBNP 331.3*    SignedConley Canal [phone]  [facility] 06/20/2013, 1:21 PM  Pt interviewed and examined. Feels better. Echo ok.  No indication for continued hospitalization of chronic problem. F/u with pulmonary as outpatient.  Crista Curb, M.D.

## 2013-06-20 NOTE — ED Notes (Signed)
Report to [facility] on 2W.  Pt to go to floor with EDT accompanying.

## 2013-06-21 DIAGNOSIS — M329 Systemic lupus erythematosus, unspecified: Secondary | ICD-10-CM

## 2013-06-21 MED ORDER — BENZONATATE 100 MG PO CAPS: 100.0000 mg | ORAL_CAPSULE | Freq: Three times a day (TID) | ORAL | Status: DC | PRN

## 2013-06-21 MED ORDER — BENZONATATE 200 MG PO CAPS: 200.0000 mg | ORAL_CAPSULE | Freq: Three times a day (TID) | ORAL | Status: DC | PRN

## 2013-06-21 MED ORDER — BENZONATATE 200 MG PO CAPS: 200.0000 mg | ORAL_CAPSULE | Freq: Three times a day (TID) | ORAL | Status: DC

## 2013-06-21 NOTE — Progress Notes (Signed)
   CARE MANAGEMENT NOTE 06/21/2013  Patient:  AVARY, EICHENBERGER   Account Number:  [id number]  Date Initiated:  06/20/2013  Documentation initiated by:  AMERSON,JULIE  Subjective/Objective Assessment:   PT ADM ON 12/30 WITH PLEURISY, COUGH.  PTA, PT INDEPENDENT, LIVES WITH SON.     Action/Plan:   WILL FOLLOW FOR DISCHARGE NEEDS AS PT PROGRESSES.   Anticipated DC Date:  06/20/2013   Anticipated DC Plan:  [place]  CM consult  Medication Assistance      Choice offered to / List presented to:             Status of service:  Completed, signed off Medicare Important Message given?   (If response is "NO", the following Medicare IM given date fields will be blank) Date Medicare IM given:   Date Additional Medicare IM given:    Discharge Disposition:  HOME/SELF CARE  Per UR Regulation:  Reviewed for med. necessity/level of care/duration of stay  If discussed at [place] of Stay Meetings, dates discussed:    Comments:  06/21/13 11:45 CM received call from RN as pt states she cannot afford her tessalon.  Call made to MD to change dosage so pt can get the medication form [facility]'s 4$ list. No other CM needs were communicated.  Mariane Masters, BSN, [place].  06/20/13 JULIE AMERSON,RN,BSN [phone] [facility] SCREENED PT FOR ELIGIBILITY, BUT SHE IS NOT APPROPRIATE FOR THEIR SERVICES, PER [facility] LIASION.

## 2013-06-25 ENCOUNTER — Ambulatory Visit (HOSPITAL_COMMUNITY)
Admission: RE | Admit: 2013-06-25 | Discharge: 2013-06-25 | Disposition: A | Discharge status: Home / Self Care | Payer: PRIVATE HEALTH INSURANCE | Source: Ambulatory Visit | Attending: Internal Medicine | Admitting: Internal Medicine

## 2013-06-25 ENCOUNTER — Other Ambulatory Visit (HOSPITAL_COMMUNITY): Payer: Self-pay | Admitting: Internal Medicine

## 2013-06-25 DIAGNOSIS — J189 Pneumonia, unspecified organism: Secondary | ICD-10-CM

## 2013-06-25 DIAGNOSIS — R918 Other nonspecific abnormal finding of lung field: Secondary | ICD-10-CM | POA: Insufficient documentation

## 2013-06-29 ENCOUNTER — Encounter: Payer: Self-pay | Admitting: Internal Medicine

## 2013-06-29 ENCOUNTER — Ambulatory Visit (INDEPENDENT_AMBULATORY_CARE_PROVIDER_SITE_OTHER): Payer: PRIVATE HEALTH INSURANCE | Admitting: Internal Medicine

## 2013-06-29 ENCOUNTER — Encounter (INDEPENDENT_AMBULATORY_CARE_PROVIDER_SITE_OTHER): Payer: Self-pay

## 2013-06-29 VITALS — BP 116/80 | Temp 98.4°F | Ht 66.0 in | Wt 214.0 lb

## 2013-06-29 DIAGNOSIS — R0602 Shortness of breath: Secondary | ICD-10-CM

## 2013-06-29 DIAGNOSIS — M329 Systemic lupus erythematosus, unspecified: Secondary | ICD-10-CM

## 2013-06-29 DIAGNOSIS — R918 Other nonspecific abnormal finding of lung field: Secondary | ICD-10-CM

## 2013-06-29 DIAGNOSIS — K219 Gastro-esophageal reflux disease without esophagitis: Secondary | ICD-10-CM

## 2013-06-29 MED ORDER — HYDROMORPHONE HCL 4 MG PO TABS: 4.0000 mg | ORAL_TABLET | Freq: Four times a day (QID) | ORAL | Status: DC | PRN

## 2013-06-29 MED ORDER — PREDNISONE 10 MG PO TABS: ORAL_TABLET | ORAL | Status: DC

## 2013-06-29 NOTE — Progress Notes (Signed)
Subjective:    Patient ID: [name], female    DOB: September 21, 1965  MRN: [id number]  HPI  69 yobf quit smoking 2005 with dx of nasal ca/ s/p radiation  On prednisone since about the same time for dx of sle  started on mtx 2012 and tapered down to 2.5 mg mid Oct which corresponded to onset  of cp and sob reffered to pulmonary p adtmit for eval of Ant  R pleuritic chest pain, hacking dry cough with intermittent hemoptysis  Admit date: 06/19/2013  Discharge date: 06/21/2012   Patient was unhappy that her chronic symptoms were not resolved prior to discharge. She complained that the admitting doctor told her that her pleurisy would be resolved before discharge.  Recommendations for Outpatient Follow-up:  Follow up with PCP in 1 week. C  Appt with Dr. Melvyn Novas 06/29/12 for follow up - dyspnea, chronic cough, min. Hemoptysis.  Follow up with Dr. Trudie Reed as previously scheduled on 07/12/13 for lupus.  Discharge Diagnoses:  Principal Problem:  Pleurisy  Active Problems:  Chronic use of steroids  Lupus (systemic lupus erythematosus)  Psoriasis  GERD (gastroesophageal reflux disease)  Hypothyroidism  Discharge Condition: stable, still with dyspnea and cough. Condition is chronic and stable for outpatient follow up.  Diet recommendation: Heart Healthy  Filed Weights    06/20/13 0041  06/20/13 0737   Weight:  93.8 kg (206 lb 12.7 oz)  93.3 kg (205 lb 11 oz)   History of present illness:  48 yo female with a history of nasopharyngeal cancer s/p chemo and radiation (73yrs ago) with Drs. Mohamed and [place], lupus and psoriasis (rheumatologist is Dr. Trudie Reed). Presents with two months of dyspnea and cough. Her shortness of breath is unchanged at rest vs with mobility. She has taken courses of clinda and levaquin without improvement. She has been tapering down her chronic prednisone for 6 months. She mentions that 12/29 she had an episode of hemoptysis x sev tsp of brb Hospital Course:  SOB with cough   CT Angio completed 12/22 shows no PE  Discussed via telephone with Dr. Elsworth Soho. He was unimpressed with ground glass opacity on CT & recommended OP follow up.  Appointment scheduled with Dr. Melvyn Novas.  Patient being treated without antibiotics.  Slight improvement on steroids.  Patient stable and can be treated as outpatient.  Will check 2D echo and BNP before she goes. Results to be followed outpatient.  Lupus  Per Dr. Trudie Reed Outpatient  On prednisone. She received solumedrol in ED and admitted increased her PO dose to 20mg  daily.  Psoriasis  Notable on skin but appears controlled.  Prednisone.  Hypothyroidism  Synthroid    06/29/2013 new pt eval  Melvyn Novas Chief Complaint  Patient presents with  . Pulmonary Consult    referred by Dr. Conley Canal for SOB and Hemoptysis.  last dose of Prednisone 06/25/13 felt worse since, cp was better while on higher doses of steroids and now is bialteral band like pleuritica around lower chest but worst on R below and just lateral to  R breast  Last levaquin 06/29/13 no fever or purulent sputum, no further hemoptysis. Sob x more than slow adls  No obvious day to day or daytime variabilty or assoc chest tightness, subjective wheeze overt sinus or hb symptoms. No unusual exp hx or h/o childhood pna/ asthma or knowledge of premature birth.  No h/o epistaxis  Sleeping ok without nocturnal  or early am exacerbation  of respiratory  c/o's or need for noct  saba. Also denies any obvious fluctuation of symptoms with weather or environmental changes or other aggravating or alleviating factors except as outlined above   Current Medications, Allergies, Complete Past Medical History, Past Surgical History, Family History, and Social History were reviewed in [employer] record.             Review of Systems  Constitutional: Positive for unexpected weight change. Negative for fever, chills, diaphoresis, activity change, appetite change and fatigue.   HENT: Positive for sore throat. Negative for congestion, dental problem, ear discharge, ear pain, facial swelling, hearing loss, mouth sores, nosebleeds, postnasal drip, rhinorrhea, sinus pressure, sneezing, tinnitus, trouble swallowing and voice change.   Eyes: Negative for photophobia, discharge, itching and visual disturbance.  Respiratory: Positive for cough and shortness of breath. Negative for apnea, choking, chest tightness, wheezing and stridor.   Cardiovascular: Negative for chest pain, palpitations and leg swelling.  Gastrointestinal: Negative for nausea, vomiting, abdominal pain, constipation, blood in stool and abdominal distention.  Genitourinary: Negative for dysuria, urgency, frequency, hematuria, flank pain, decreased urine volume and difficulty urinating.  Musculoskeletal: Negative for arthralgias, back pain, gait problem, joint swelling, myalgias, neck pain and neck stiffness.  Skin: Negative for color change, pallor and rash.  Neurological: Negative for dizziness, tremors, seizures, syncope, speech difficulty, weakness, light-headedness, numbness and headaches.  Hematological: Negative for adenopathy. Does not bruise/bleed easily.  Psychiatric/Behavioral: Negative for confusion, sleep disturbance and agitation. The patient is not nervous/anxious.        Objective:   Physical Exam  amb very hoarse bf nad Wt Readings from Last 3 Encounters:  06/29/13 214 lb (97.07 kg)  06/21/13 207 lb 1.6 oz (93.94 kg)  06/11/13 216 lb (97.977 kg)      HEENT: nl dentition, turbinates, and orophanx. Nl external ear canals without cough reflex   NECK :  without JVD/Nodes/TM/ nl carotid upstrokes bilaterally   LUNGS: no acc muscle use, clear to A and P bilaterally without cough on insp or exp maneuvers   CV:  RRR  no s3 or murmur or increase in P2, no edema   ABD:  soft and nontender with nl excursion in the supine position. No bruits or organomegaly, bowel sounds nl  MS:  warm  without deformities, calf tenderness, cyanosis or clubbing  SKIN: warm and dry without lesions / severe psoriasis    NEURO:  alert, approp, no deficits    cxr 06/25/13 ? Early RML infiltrate     Assessment & Plan:

## 2013-06-29 NOTE — Patient Instructions (Signed)
Stop fosfamax  protonix (pantoprazole)  Take 30- 60 min before your first and last meals of the day   Prednisone 10 mg take 2 with bfast and supper until better (or you see Dr Trudie Reed) then take one twice daily   Please schedule a follow up office visit in 4 weeks, sooner if needed with pfts

## 2013-06-30 DIAGNOSIS — R918 Other nonspecific abnormal finding of lung field: Secondary | ICD-10-CM

## 2013-06-30 HISTORY — DX: Other nonspecific abnormal finding of lung field: R91.8

## 2013-06-30 NOTE — Assessment & Plan Note (Signed)
Not clear to what extent gerd may be playing in any of her symptoms as this point > try off fosfamax and max gerd/ diet restrictions until sort this out next ov

## 2013-06-30 NOTE — Assessment & Plan Note (Signed)
Almost certainly the bilateral pleuritic cp disproportionate to findings assoc with infiltrates and now hemoptyis, though min and sporadic, is related to SLE > rechallenge with pred 20 bid pending her next f/u with Dr Trudie Reed planned for later  this month.

## 2013-06-30 NOTE — Assessment & Plan Note (Addendum)
See CT chest 06/11/13 1. No evidence of a pulmonary embolus.  2. Mild patchy infiltrate in the left lower lobe and minimally in  posterior inferior left upper lobe associated with mild left  infrahilar and subcarinal reactive adenopathy.  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  For now rec rx asl lupus lung dz and regroup in 4 weeks as she has received adequate abx for now for CAP

## 2013-07-31 ENCOUNTER — Ambulatory Visit (INDEPENDENT_AMBULATORY_CARE_PROVIDER_SITE_OTHER): Payer: Medicare Other | Admitting: Internal Medicine

## 2013-07-31 ENCOUNTER — Encounter: Payer: Self-pay | Admitting: Internal Medicine

## 2013-07-31 ENCOUNTER — Ambulatory Visit (INDEPENDENT_AMBULATORY_CARE_PROVIDER_SITE_OTHER)
Admission: RE | Admit: 2013-07-31 | Discharge: 2013-07-31 | Disposition: A | Discharge status: Home / Self Care | Payer: Medicare Other | Source: Ambulatory Visit | Attending: Internal Medicine | Admitting: Internal Medicine

## 2013-07-31 VITALS — BP 108/70 | HR 87 | Temp 98.1°F | Ht 64.0 in | Wt 204.0 lb

## 2013-07-31 DIAGNOSIS — R918 Other nonspecific abnormal finding of lung field: Secondary | ICD-10-CM

## 2013-07-31 DIAGNOSIS — R0602 Shortness of breath: Secondary | ICD-10-CM

## 2013-07-31 LAB — PULMONARY FUNCTION TEST
DL/VA % pred: 94 %
DL/VA: 4.56 ml/min/mmHg/L
DLCO unc % pred: 68 %
DLCO unc: 16.63 ml/min/mmHg
FEF 25-75 PRE: 2.44 L/s
FEF 25-75 Post: 2.46 L/sec
FEF2575-%Change-Post: 0 %
FEF2575-%Pred-Post: 97 %
FEF2575-%Pred-Pre: 97 %
FEV1-%Change-Post: 1 %
FEV1-%PRED-POST: 83 %
FEV1-%Pred-Pre: 82 %
FEV1-POST: 1.96 L
FEV1-Pre: 1.94 L
FEV1FVC-%CHANGE-POST: 0 %
FEV1FVC-%Pred-Pre: 104 %
FEV6-%Change-Post: 1 %
FEV6-%Pred-Post: 80 %
FEV6-%Pred-Pre: 79 %
FEV6-POST: 2.3 L
FEV6-PRE: 2.26 L
FEV6FVC-%Pred-Post: 103 %
FEV6FVC-%Pred-Pre: 103 %
FVC-%Change-Post: 1 %
FVC-%PRED-POST: 78 %
FVC-%PRED-PRE: 77 %
FVC-PRE: 2.26 L
FVC-Post: 2.3 L
POST FEV6/FVC RATIO: 100 %
Post FEV1/FVC ratio: 85 %
Pre FEV1/FVC ratio: 86 %
Pre FEV6/FVC Ratio: 100 %
RV % PRED: 64 %
RV: 1.13 L
TLC % PRED: 71 %
TLC: 3.6 L

## 2013-07-31 NOTE — Patient Instructions (Addendum)
Ok to take symbicort if you feel it really helps your breathing  Please remember to go to the   x-ray department downstairs for your tests - we will call you with the results when they are available.   If you are satisfied with your treatment plan let your doctor know and he/she can either refill your medications or you can return here when your prescription runs out.     If in any way you are not 100% satisfied,  please tell us.  If 100% better, tell your friends!

## 2013-07-31 NOTE — Progress Notes (Signed)
Subjective:    Patient ID: [name], female    DOB: 1965-07-04  MRN: [id number]  HPI  80 yobf quit smoking 2005 with dx of nasal ca/ s/p radiation  On prednisone since about the same time for dx of sle  started on mtx 2012 and tapered down to 2.5 mg mid Oct which corresponded to onset  of cp and sob reffered to pulmonary p adtmit for eval of Ant  R pleuritic chest pain, hacking dry cough with intermittent hemoptysis  Admit date: 06/19/2013  Discharge date: 06/21/2012   Patient was unhappy that her chronic symptoms were not resolved prior to discharge. She complained that the admitting doctor told her that her pleurisy would be resolved before discharge.  Recommendations for Outpatient Follow-up:  Follow up with PCP in 1 week. C  Appt with Dr. Melvyn Maxwell 06/29/12 for follow up - dyspnea, chronic cough, min. Hemoptysis.  Follow up with Dr. Trudie Maxwell as previously scheduled on 07/12/13 for lupus.  Discharge Diagnoses:  Principal Problem:  Pleurisy  Active Problems:  Chronic use of steroids  Lupus (systemic lupus erythematosus)  Psoriasis  GERD (gastroesophageal reflux disease)  Hypothyroidism  Discharge Condition: stable, still with dyspnea and cough. Condition is chronic and stable for outpatient follow up.  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 [name] and [name], lupus and psoriasis (rheumatologist is Dr. Trudie Maxwell). Presents with two months of dyspnea and cough. Her shortness of breath is unchanged at rest vs with mobility. She has taken courses of clinda and levaquin without improvement. She has been tapering down her chronic prednisone for 6 months. She mentions that 12/29 she had an episode of hemoptysis x sev tsp of brb [facility] Course:  SOB with cough   CT Angio completed 12/22 shows no PE  Discussed via telephone with Dr. Elsworth Maxwell. He was unimpressed with ground glass opacity on CT & recommended OP follow up.  Appointment scheduled with Dr. Melvyn Maxwell.  Patient being treated without antibiotics.  Slight improvement on steroids.  Patient stable and can be treated as outpatient.  Will check 2D echo and BNP before she goes. Results to be followed outpatient.  Lupus  Per Dr. Trudie Maxwell Outpatient  On prednisone. She received solumedrol in ED and admitted increased her PO dose to 20mg  daily.  Psoriasis  Notable on skin but appears controlled.  Prednisone.  Hypothyroidism  Synthroid    06/29/2013 new pt eval  [name] Chief Complaint  Patient presents with  . Pulmonary Consult    referred by Dr. Conley Maxwell for SOB and Hemoptysis.  last dose of Prednisone 06/25/13 felt worse since, cp was better while on higher doses of steroids and now is bialteral band like pleuritica around lower chest but worst on R below and just lateral to  R breast  Last levaquin 06/29/13 no fever or purulent sputum, no further hemoptysis. Sob x more than slow adls rec Stop fosfamax  protonix (pantoprazole)  Take 30- 60 min before your first and last meals of the day  Prednisone 10 mg take 2 with bfast and supper until better (or you see Dr [name]) then take one twice daily > never took it > saw [name] 1/28    07/31/2013 f/u ov/[name] re:  back  On mtx and prn celebrex and maint symbicort  Chief Complaint  Patient presents with  . Follow-up    Review pft.  Pt denies any breathing problems at this time.  Not limited by breathing from desired activities  No need for prn saba arthritis even better  No obvious day to day or daytime variabilty or assoc chronic cough or cp or chest tightness, subjective wheeze overt sinus or hb symptoms. No unusual exp hx or h/o childhood pna/ asthma or knowledge of premature birth.  Sleeping ok without nocturnal  or early am exacerbation  of respiratory   c/o's or need for noct saba. Also denies any obvious fluctuation of symptoms with weather or environmental changes or other aggravating or alleviating factors except as outlined above   Current Medications, Allergies, Complete Past Medical History, Past Surgical History, Family History, and Social History were reviewed in [employer] record.  ROS  The following are not active complaints unless bolded sore throat, dysphagia, dental problems, itching, sneezing,  nasal congestion or excess/ purulent secretions, ear ache,   fever, chills, sweats, unintended wt loss, pleuritic or exertional cp, hemoptysis,  orthopnea pnd or leg swelling, presyncope, palpitations, heartburn, abdominal pain, anorexia, nausea, vomiting, diarrhea  or change in bowel or urinary habits, change in stools or urine, dysuria,hematuria,  rash, arthralgias, visual complaints, headache, numbness weakness or ataxia or problems with walking or coordination,  change in mood/affect or memory.          Objective:   Physical Exam  amb very hoarse bf nad  07/31/2013       204 Wt Readings from Last 3 Encounters:  06/29/13 214 lb (97.07 kg)  06/21/13 207 lb 1.6 oz (93.94 kg)  06/11/13 216 lb (97.977 kg)      HEENT: nl dentition, turbinates, and orophanx. Nl external ear canals without cough reflex   NECK :  without JVD/Nodes/TM/ nl carotid upstrokes bilaterally   LUNGS: no acc muscle use, clear to A and P bilaterally without cough on insp or exp maneuvers   CV:  RRR  no s3 or murmur or increase in P2, no edema   ABD:  soft and nontender with nl excursion in the supine position. No bruits or organomegaly, bowel sounds nl  MS:  warm without deformities, calf tenderness, cyanosis or clubbing  SKIN: warm and dry without lesions / severe psoriasis    NEURO:  alert, approp, no deficits    CXR  07/31/2013 :   No definite acute abnormalities.      Assessment & Plan:

## 2013-07-31 NOTE — Assessment & Plan Note (Addendum)
See CT chest 06/11/13 1. No evidence of a pulmonary embolus.  2. Mild patchy infiltrate in the left lower lobe and minimally in  posterior inferior left upper lobe associated with mild left  infrahilar and subcarinal reactive adenopathy. - PFT's 07/31/2013  2.47 VC (84%)  and no obst,  DLCO 68% and corrects to 94%  p am symbicort  - cxr clear 07/31/2013   No further regular  f/u needed - not clear she really needs symbicort because she took it before the study and by hx she does improve with it so ok to continue but also ok to just use it prn to see if it really does make a consistent difference in which case she likely also has a component of asthma not documented here

## 2013-07-31 NOTE — Progress Notes (Signed)
PFT done today. 

## 2013-08-22 ENCOUNTER — Encounter: Payer: Self-pay | Admitting: Internal Medicine

## 2013-08-22 DIAGNOSIS — G894 Chronic pain syndrome: Secondary | ICD-10-CM

## 2013-08-22 HISTORY — DX: Chronic pain syndrome: G89.4

## 2013-09-25 ENCOUNTER — Ambulatory Visit: Payer: Medicare Other

## 2013-10-07 ENCOUNTER — Other Ambulatory Visit: Payer: Self-pay | Admitting: Obstetrics & Gynecology

## 2013-10-09 ENCOUNTER — Other Ambulatory Visit: Payer: Self-pay | Admitting: *Deleted

## 2013-10-09 NOTE — Telephone Encounter (Signed)
PHARMACIST ADVISED TO HAVE PATIENT CONTACT OFFICE REGARDING REFILL.

## 2013-10-12 ENCOUNTER — Encounter: Payer: Self-pay | Admitting: Obstetrics & Gynecology

## 2013-11-09 ENCOUNTER — Ambulatory Visit: Payer: Medicare Other

## 2013-11-26 ENCOUNTER — Ambulatory Visit: Payer: PRIVATE HEALTH INSURANCE | Admitting: Obstetrics & Gynecology

## 2013-11-30 ENCOUNTER — Encounter (HOSPITAL_COMMUNITY): Payer: Self-pay | Admitting: Emergency Medicine

## 2013-11-30 ENCOUNTER — Emergency Department (HOSPITAL_COMMUNITY)
Admission: EM | Admit: 2013-11-30 | Discharge: 2013-11-30 | Disposition: A | Discharge status: Home / Self Care | Payer: Medicare Other | Attending: Emergency Medicine | Admitting: Emergency Medicine

## 2013-11-30 DIAGNOSIS — Z91040 Latex allergy status: Secondary | ICD-10-CM | POA: Insufficient documentation

## 2013-11-30 DIAGNOSIS — E119 Type 2 diabetes mellitus without complications: Secondary | ICD-10-CM | POA: Diagnosis not present

## 2013-11-30 DIAGNOSIS — Z79899 Other long term (current) drug therapy: Secondary | ICD-10-CM | POA: Insufficient documentation

## 2013-11-30 DIAGNOSIS — Z8742 Personal history of other diseases of the female genital tract: Secondary | ICD-10-CM | POA: Insufficient documentation

## 2013-11-30 DIAGNOSIS — R51 Headache: Secondary | ICD-10-CM | POA: Insufficient documentation

## 2013-11-30 DIAGNOSIS — F411 Generalized anxiety disorder: Secondary | ICD-10-CM | POA: Insufficient documentation

## 2013-11-30 DIAGNOSIS — Z8739 Personal history of other diseases of the musculoskeletal system and connective tissue: Secondary | ICD-10-CM | POA: Insufficient documentation

## 2013-11-30 DIAGNOSIS — Z872 Personal history of diseases of the skin and subcutaneous tissue: Secondary | ICD-10-CM | POA: Diagnosis not present

## 2013-11-30 DIAGNOSIS — E039 Hypothyroidism, unspecified: Secondary | ICD-10-CM | POA: Insufficient documentation

## 2013-11-30 DIAGNOSIS — Z87891 Personal history of nicotine dependence: Secondary | ICD-10-CM | POA: Insufficient documentation

## 2013-11-30 DIAGNOSIS — Z85819 Personal history of malignant neoplasm of unspecified site of lip, oral cavity, and pharynx: Secondary | ICD-10-CM | POA: Insufficient documentation

## 2013-11-30 DIAGNOSIS — Z8619 Personal history of other infectious and parasitic diseases: Secondary | ICD-10-CM | POA: Insufficient documentation

## 2013-11-30 DIAGNOSIS — Z880 Allergy status to penicillin: Secondary | ICD-10-CM | POA: Insufficient documentation

## 2013-11-30 DIAGNOSIS — R519 Headache, unspecified: Secondary | ICD-10-CM

## 2013-11-30 LAB — BASIC METABOLIC PANEL
BUN: 8 mg/dL (ref 6–23)
CALCIUM: 9 mg/dL (ref 8.4–10.5)
CO2: 22 mEq/L (ref 19–32)
Chloride: 107 mEq/L (ref 96–112)
Creatinine, Ser: 0.98 mg/dL (ref 0.50–1.10)
GFR calc Af Amer: 78 mL/min — ABNORMAL LOW (ref >90)
GFR, EST NON AFRICAN AMERICAN: 67 mL/min — AB (ref >90)
Glucose, Bld: 99 mg/dL (ref 70–99)
Potassium: 3.4 mEq/L — ABNORMAL LOW (ref 3.7–5.3)
SODIUM: 142 meq/L (ref 137–147)

## 2013-11-30 LAB — CBC WITH DIFFERENTIAL/PLATELET
BASOS PCT: 0 % (ref 0–1)
Basophils Absolute: 0 10*3/uL (ref 0.0–0.1)
EOS ABS: 0.2 10*3/uL (ref 0.0–0.7)
EOS PCT: 1 % (ref 0–5)
HCT: 34.5 % — ABNORMAL LOW (ref 36.0–46.0)
Hemoglobin: 12 g/dL (ref 12.0–15.0)
LYMPHS ABS: 1.7 10*3/uL (ref 0.7–4.0)
Lymphocytes Relative: 14 % (ref 12–46)
MCH: 31.9 pg (ref 26.0–34.0)
MCHC: 34.8 g/dL (ref 30.0–36.0)
MCV: 91.8 fL (ref 78.0–100.0)
Monocytes Absolute: 0.6 10*3/uL (ref 0.1–1.0)
Monocytes Relative: 5 % (ref 3–12)
Neutro Abs: 9.5 10*3/uL — ABNORMAL HIGH (ref 1.7–7.7)
Neutrophils Relative %: 80 % — ABNORMAL HIGH (ref 43–77)
PLATELETS: 366 10*3/uL (ref 150–400)
RBC: 3.76 MIL/uL — ABNORMAL LOW (ref 3.87–5.11)
RDW: 14.5 % (ref 11.5–15.5)
WBC: 12 10*3/uL — ABNORMAL HIGH (ref 4.0–10.5)

## 2013-11-30 MED ORDER — SODIUM CHLORIDE 0.9 % IV BOLUS (SEPSIS)
1000.0000 mL | Freq: Once | INTRAVENOUS | Status: AC
  Administered 2013-11-30: 1000 mL via INTRAVENOUS

## 2013-11-30 MED ORDER — DIPHENHYDRAMINE HCL 50 MG/ML IJ SOLN
25.0000 mg | Freq: Once | INTRAMUSCULAR | Status: AC
  Administered 2013-11-30: 25 mg via INTRAVENOUS
  Filled 2013-11-30: qty 1

## 2013-11-30 MED ORDER — HYDROMORPHONE HCL PF 1 MG/ML IJ SOLN
0.5000 mg | Freq: Once | INTRAMUSCULAR | Status: AC
  Administered 2013-11-30: 0.5 mg via INTRAVENOUS
  Filled 2013-11-30: qty 1

## 2013-11-30 MED ORDER — METOCLOPRAMIDE HCL 5 MG/ML IJ SOLN
5.0000 mg | Freq: Once | INTRAMUSCULAR | Status: AC
  Administered 2013-11-30: 5 mg via INTRAVENOUS
  Filled 2013-11-30: qty 2

## 2013-11-30 MED ORDER — KETOROLAC TROMETHAMINE 30 MG/ML IJ SOLN
30.0000 mg | Freq: Once | INTRAMUSCULAR | Status: AC
  Administered 2013-11-30: 30 mg via INTRAVENOUS
  Filled 2013-11-30: qty 1

## 2013-11-30 NOTE — ED Notes (Signed)
Per pt, states migraine for 2 days-not relieved with home meds-released from [facility] neurology because she missed too many appointments

## 2013-11-30 NOTE — ED Notes (Signed)
Attempted to start an IV and draw blood x1 no success, pt sts it "always takes someone from anesthesia to stick me". CN notified and will attempt ultrasound guided PIV

## 2013-11-30 NOTE — Discharge Instructions (Signed)
Continue all regular medications. Follow up with primary care doctor on Monday. Return if worsening.   Migraine Headache A migraine headache is an intense, throbbing pain on one or both sides of your head. A migraine can last for 30 minutes to several hours. CAUSES  The exact cause of a migraine headache is not always known. However, a migraine may be caused when nerves in the brain become irritated and release chemicals that cause inflammation. This causes pain. Certain things may also trigger migraines, such as:  Alcohol.  Smoking.  Stress.  Menstruation.  Aged cheeses.  Foods or drinks that contain nitrates, glutamate, aspartame, or tyramine.  Lack of sleep.  Chocolate.  Caffeine.  Hunger.  Physical exertion.  Fatigue.  Medicines used to treat chest pain (nitroglycerine), birth control pills, estrogen, and some blood pressure medicines. SIGNS AND SYMPTOMS  Pain on one or both sides of your head.  Pulsating or throbbing pain.  Severe pain that prevents daily activities.  Pain that is aggravated by any physical activity.  Nausea, vomiting, or both.  Dizziness.  Pain with exposure to bright lights, loud noises, or activity.  General sensitivity to bright lights, loud noises, or smells. Before you get a migraine, you may get warning signs that a migraine is coming (aura). An aura may include:  Seeing flashing lights.  Seeing bright spots, halos, or zig-zag lines.  Having tunnel vision or blurred vision.  Having feelings of numbness or tingling.  Having trouble talking.  Having muscle weakness. DIAGNOSIS  A migraine headache is often diagnosed based on:  Symptoms.  Physical exam.  A CT scan or MRI of your head. These imaging tests cannot diagnose migraines, but they can help rule out other causes of headaches. TREATMENT Medicines may be given for pain and nausea. Medicines can also be given to help prevent recurrent migraines.  HOME CARE  INSTRUCTIONS  Only take over-the-counter or prescription medicines for pain or discomfort as directed by your health care provider. The use of long-term narcotics is not recommended.  Lie down in a dark, quiet room when you have a migraine.  Keep a journal to find out what may trigger your migraine headaches. For example, write down:  What you eat and drink.  How much sleep you get.  Any change to your diet or medicines.  Limit alcohol consumption.  Quit smoking if you smoke.  Get 7 9 hours of sleep, or as recommended by your health care provider.  Limit stress.  Keep lights dim if bright lights bother you and make your migraines worse. SEEK IMMEDIATE MEDICAL CARE IF:   Your migraine becomes severe.  You have a fever.  You have a stiff neck.  You have vision loss.  You have muscular weakness or loss of muscle control.  You start losing your balance or have trouble walking.  You feel faint or pass out.  You have severe symptoms that are different from your first symptoms. MAKE SURE YOU:   Understand these instructions.  Will watch your condition.  Will get help right away if you are not doing well or get worse. Document Released: 06/07/2005 Document Revised: 03/28/2013 Document Reviewed: 02/12/2013 [facility] Patient Information 2014 [employer].

## 2013-11-30 NOTE — ED Provider Notes (Signed)
Medical screening examination/treatment/procedure(s) were performed by non-physician practitioner and as supervising physician I was immediately available for consultation/collaboration.   EKG Interpretation None        Wandra Arthurs, MD 11/30/13 2322

## 2013-11-30 NOTE — ED Provider Notes (Signed)
CSN: [id number]     Arrival date & time 11/30/13  1548 History   First MD Initiated Contact with Patient 11/30/13 1614     Chief Complaint  Patient presents with  . Migraine     (Consider location/radiation/quality/duration/timing/severity/associated sxs/prior Treatment) HPI ZOEY BIDWELL is a 48 y.o. female who presents to emergency department complaining of a headache. Patient states she has history of migraines, used to followed by a neurologist who released her because she missed too many appointments. She currently takes Topamax for prophylaxis. She states that this headache started 3 days ago. States it goes from one side of the head to the other. She has tried Midol with no relief. She denies any head injuries. She denies any fever or chills. She denies any nausea or vomiting. She does report associated photophobia and phonophobia. She did not try any other medications prior to coming in. Patient does report multiple other medical problems, states currently all under control, followed by primary care Dr. She does state that this headache feels exactly like her prior headaches. Denies any focal neurological symptoms.  Past Medical History  Diagnosis Date  . Lupus   . Cancer     nasopharenx  . 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 Irregular menstrual cycle   . Papanicolaou smear of cervix with atypical squamous cells of undetermined significance (ASC-US)   . Excessive or frequent menstruation   . Leiomyoma of uterus, unspecified   . Trichimoniasis   . Psoriasis   . Headache(784.0)   . GERD (gastroesophageal reflux disease)   . Arthritis     ra, lupus  . Anxiety   . Hypothyroidism    Past Surgical History  Procedure Laterality Date  . Ankle arthroscopy    . Cesarean section    . Lymph node biopsy      neck  . Multiple extractions with alveoloplasty N/A 03/05/2013    Procedure: MULTIPLE EXTRACION WITH ALVEOLOPLASTY, extraction of decayed  teeth numbers 3,4,5,18,19,22,23,24,25, extraction of retained root tips teeth numbers 2,20,21,26, bilateral mandibular alveoloplasty and upper right maxillary alveoloplasty;  Surgeon: Isac Caddy, DDS;  Location: [facility];  Service: Oral Surgery;  Laterality: N/A;  . Tooth extraction Bilateral 03/05/2013    Procedure: EXTRACTION MOLARS;  Surgeon: Isac Caddy, DDS;  Location: [place];  Service: Oral Surgery;  Laterality: Bilateral;   Family History  Problem Relation Age of Onset  . Lupus Brother    History  Substance Use Topics  . Smoking status: Former Smoker -- 0.25 packs/day for 8 years    Quit date: 02/28/2004  . Smokeless tobacco: Never Used  . Alcohol Use: No   OB History   Grav Para Term Preterm Abortions TAB SAB Ect Mult Living                 Review of Systems  Constitutional: Negative for fever and chills.  HENT: Negative for congestion and sinus pressure.   Respiratory: Negative for cough, chest tightness and shortness of breath.   Cardiovascular: Negative for chest pain, palpitations and leg swelling.  Gastrointestinal: Negative for nausea, vomiting, abdominal pain and diarrhea.  Genitourinary: Negative for dysuria, flank pain and pelvic pain.  Musculoskeletal: Negative for arthralgias, myalgias, neck pain and neck stiffness.  Skin: Negative for rash.  Neurological: Positive for headaches. Negative for dizziness, weakness and numbness.  All other systems reviewed and are negative.     Allergies  Amoxicillin; Latex; Percocet; Plaquenil; Bactrim; Ciprofloxacin  hcl; Keflex; Penicillins; and Tetracyclines & related  Home Medications   Prior to Admission medications   Medication Sig Start Date End Date Taking? Authorizing Provider  albuterol (PROVENTIL HFA;VENTOLIN HFA) 108 (90 BASE) MCG/ACT inhaler Inhale 2 puffs into the lungs every 6 (six) hours as needed. For breathing    Historical Provider, MD  albuterol (PROVENTIL) (2.5 MG/3ML) 0.083% nebulizer  solution Take 2.5 mg by nebulization every 6 (six) hours as needed for wheezing or shortness of breath.    Historical Provider, MD  AMETHIA 0.15-0.03 &0.01 MG tablet Take 1 tablet by mouth daily.  10/09/13   Historical Provider, MD  budesonide-formoterol (SYMBICORT) 160-4.5 MCG/ACT inhaler Inhale 2 puffs into the lungs 2 (two) times daily.    Historical Provider, MD  celecoxib (CELEBREX) 100 MG capsule Take 200 mg by mouth daily as needed.    Historical Provider, MD  cyclobenzaprine (FLEXERIL) 10 MG tablet Take 10 mg by mouth 2 (two) times daily as needed. For pain    Historical Provider, MD  ferrous sulfate 325 (65 FE) MG tablet Take 325 mg by mouth daily with breakfast.    Historical Provider, MD  fexofenadine (ALLEGRA) 180 MG tablet Take 180 mg by mouth daily.    Historical Provider, MD  FOLIC ACID PO Take 2 mg by mouth daily.    Historical Provider, MD  furosemide (LASIX) 80 MG tablet Take 80 mg by mouth daily as needed for fluid.    Historical Provider, MD  gabapentin (NEURONTIN) 100 MG capsule Take 100 mg by mouth 3 (three) times daily.     Historical Provider, MD  hydrOXYzine (ATARAX/VISTARIL) 10 MG tablet Take 10 mg by mouth at bedtime as needed for itching or anxiety.     Historical Provider, MD  levothyroxine (SYNTHROID, LEVOTHROID) 75 MCG tablet Take 75 mcg by mouth daily.    Historical Provider, MD  LORazepam (ATIVAN) 1 MG tablet Take 1 mg by mouth daily.    Historical Provider, MD  methotrexate (RHEUMATREX) 2.5 MG tablet Take 22.5 mg by mouth once a week. On mondays    Historical Provider, MD  Multiple Vitamin (MULTIVITAMIN WITH MINERALS) TABS Take 1 tablet by mouth daily.    Historical Provider, MD  pantoprazole (PROTONIX) 40 MG tablet Take 40 mg by mouth 2 (two) times daily.     Historical Provider, MD  topiramate (TOPAMAX) 100 MG tablet Take 100 mg by mouth daily.    Historical Provider, MD  VITAMIN E PO Take 800 mg by mouth daily.     Historical Provider, MD  VOLTAREN 1 % GEL Apply  4 g topically 3 (three) times daily after meals.  05/26/13   Historical Provider, MD   BP 107/66  Pulse 110  Temp(Src) 98.7 F (37.1 C) (Oral)  Resp 16  SpO2 100% Physical Exam  Nursing note and vitals reviewed. Constitutional: She is oriented to person, place, and time. She appears well-developed and well-nourished. No distress.  HENT:  Head: Normocephalic and atraumatic.  Right Ear: External ear normal.  Left Ear: External ear normal.  Nose: Nose normal.  Mouth/Throat: Oropharynx is clear and moist.  TMs normal bilaterally  Eyes: Conjunctivae and EOM are normal. Pupils are equal, round, and reactive to light.  Neck: Normal range of motion. Neck supple.  Cardiovascular: Normal rate, regular rhythm and normal heart sounds.   Pulmonary/Chest: Effort normal and breath sounds normal. No respiratory distress. She has no wheezes. She has no rales.  Abdominal: Soft. Bowel sounds are normal. She exhibits no  distension. There is no tenderness. There is no rebound.  Musculoskeletal: She exhibits no edema.  Neurological: She is alert and oriented to person, place, and time.  5/5 and equal upper and lower extremity strength bilaterally. Equal grip strength bilaterally. Normal finger to nose and heel to shin. No pronator drift. .  Skin: Skin is warm and dry.  Psychiatric: She has a normal mood and affect. Her behavior is normal.    ED Course  Procedures (including critical care time) Labs Review Labs Reviewed  CBC WITH DIFFERENTIAL - Abnormal; Notable for the following:    WBC 12.0 (*)    RBC 3.76 (*)    HCT 34.5 (*)    Neutrophils Relative % 80 (*)    Neutro Abs 9.5 (*)    All other components within normal limits  BASIC METABOLIC PANEL - Abnormal; Notable for the following:    Potassium 3.4 (*)    GFR calc non Af Amer 67 (*)    GFR calc Af Amer 78 (*)    All other components within normal limits    Imaging Review No results found.   EKG Interpretation None      MDM    Final diagnoses:  Headache    Patient is a typical for her migraine. No relief at home with home medications. She tried Topamax and midol. Neuro exam unremarkable. Will try migraine coctail. Reglan, toradol, benadryl ordered. IV fluids ordered.    Pt feeling slightly improved, continues to have pain. Will try dilaudid 0.5mg    Pt feeling much better. Pain is at 1/10. Will d/c home.   Filed Vitals:   11/30/13 1555 11/30/13 1837  BP: 107/66 107/52  Pulse: 110 98  Temp: 98.7 F (37.1 C) 98.4 F (36.9 C)  TempSrc: Oral Oral  Resp: 16 16  SpO2: 100% 99%       Renold Genta, PA-C 11/30/13 2032

## 2013-12-17 ENCOUNTER — Ambulatory Visit: Payer: PRIVATE HEALTH INSURANCE | Admitting: Obstetrics & Gynecology

## 2013-12-20 ENCOUNTER — Ambulatory Visit: Payer: Medicare Other

## 2014-01-06 ENCOUNTER — Other Ambulatory Visit: Payer: Self-pay | Admitting: Obstetrics & Gynecology

## 2014-01-07 NOTE — Telephone Encounter (Signed)
Please address

## 2014-01-07 NOTE — Telephone Encounter (Signed)
01/07/2014 11:30 Patient called requesting Rf on medication. [name]

## 2014-01-08 ENCOUNTER — Other Ambulatory Visit: Payer: Self-pay | Admitting: Obstetrics

## 2014-01-08 ENCOUNTER — Telehealth: Payer: Self-pay | Admitting: *Deleted

## 2014-01-08 NOTE — Telephone Encounter (Signed)
Patient is calling requesting a refill on her Amethia that she takes to control her cycles- she is out and is having one now and needs it. Please advise if OK to RF.

## 2014-01-08 NOTE — Telephone Encounter (Signed)
Per patient chart- it says the Rx was received at the pharmacy. Made call to the pharmacy and it is not there. Verbally refilled patient's medication and informed patient.

## 2014-01-09 ENCOUNTER — Ambulatory Visit: Payer: PRIVATE HEALTH INSURANCE | Admitting: Obstetrics & Gynecology

## 2014-01-15 ENCOUNTER — Encounter (HOSPITAL_COMMUNITY): Payer: Self-pay | Admitting: Emergency Medicine

## 2014-01-15 ENCOUNTER — Emergency Department (HOSPITAL_COMMUNITY)
Admission: EM | Admit: 2014-01-15 | Discharge: 2014-01-16 | Disposition: A | Discharge status: Home / Self Care | Payer: Medicare Other | Attending: Emergency Medicine | Admitting: Emergency Medicine

## 2014-01-15 DIAGNOSIS — Z872 Personal history of diseases of the skin and subcutaneous tissue: Secondary | ICD-10-CM | POA: Insufficient documentation

## 2014-01-15 DIAGNOSIS — Z8619 Personal history of other infectious and parasitic diseases: Secondary | ICD-10-CM | POA: Diagnosis not present

## 2014-01-15 DIAGNOSIS — Z79899 Other long term (current) drug therapy: Secondary | ICD-10-CM | POA: Diagnosis not present

## 2014-01-15 DIAGNOSIS — Z8742 Personal history of other diseases of the female genital tract: Secondary | ICD-10-CM | POA: Diagnosis not present

## 2014-01-15 DIAGNOSIS — Z3202 Encounter for pregnancy test, result negative: Secondary | ICD-10-CM | POA: Diagnosis not present

## 2014-01-15 DIAGNOSIS — Z91040 Latex allergy status: Secondary | ICD-10-CM | POA: Insufficient documentation

## 2014-01-15 DIAGNOSIS — R197 Diarrhea, unspecified: Secondary | ICD-10-CM | POA: Insufficient documentation

## 2014-01-15 DIAGNOSIS — Z880 Allergy status to penicillin: Secondary | ICD-10-CM | POA: Diagnosis not present

## 2014-01-15 DIAGNOSIS — F411 Generalized anxiety disorder: Secondary | ICD-10-CM | POA: Insufficient documentation

## 2014-01-15 DIAGNOSIS — M129 Arthropathy, unspecified: Secondary | ICD-10-CM | POA: Insufficient documentation

## 2014-01-15 DIAGNOSIS — K219 Gastro-esophageal reflux disease without esophagitis: Secondary | ICD-10-CM | POA: Diagnosis not present

## 2014-01-15 DIAGNOSIS — E039 Hypothyroidism, unspecified: Secondary | ICD-10-CM | POA: Insufficient documentation

## 2014-01-15 DIAGNOSIS — Z87891 Personal history of nicotine dependence: Secondary | ICD-10-CM | POA: Diagnosis not present

## 2014-01-15 DIAGNOSIS — J449 Chronic obstructive pulmonary disease, unspecified: Secondary | ICD-10-CM | POA: Insufficient documentation

## 2014-01-15 DIAGNOSIS — N200 Calculus of kidney: Secondary | ICD-10-CM

## 2014-01-15 DIAGNOSIS — E119 Type 2 diabetes mellitus without complications: Secondary | ICD-10-CM | POA: Insufficient documentation

## 2014-01-15 DIAGNOSIS — R112 Nausea with vomiting, unspecified: Secondary | ICD-10-CM

## 2014-01-15 DIAGNOSIS — Z791 Long term (current) use of non-steroidal anti-inflammatories (NSAID): Secondary | ICD-10-CM | POA: Insufficient documentation

## 2014-01-15 DIAGNOSIS — J4489 Other specified chronic obstructive pulmonary disease: Secondary | ICD-10-CM | POA: Insufficient documentation

## 2014-01-15 DIAGNOSIS — R109 Unspecified abdominal pain: Secondary | ICD-10-CM | POA: Insufficient documentation

## 2014-01-15 LAB — COMPREHENSIVE METABOLIC PANEL
ALK PHOS: 62 U/L (ref 39–117)
ALT: 11 U/L (ref 0–35)
AST: 19 U/L (ref 0–37)
Albumin: 3.6 g/dL (ref 3.5–5.2)
Anion gap: 16 — ABNORMAL HIGH (ref 5–15)
BUN: 10 mg/dL (ref 6–23)
CHLORIDE: 106 meq/L (ref 96–112)
CO2: 17 mEq/L — ABNORMAL LOW (ref 19–32)
Calcium: 9.4 mg/dL (ref 8.4–10.5)
Creatinine, Ser: 1.08 mg/dL (ref 0.50–1.10)
GFR calc Af Amer: 69 mL/min — ABNORMAL LOW (ref >90)
GFR, EST NON AFRICAN AMERICAN: 60 mL/min — AB (ref >90)
Glucose, Bld: 133 mg/dL — ABNORMAL HIGH (ref 70–99)
Potassium: 4.1 mEq/L (ref 3.7–5.3)
Sodium: 139 mEq/L (ref 137–147)
Total Bilirubin: 0.2 mg/dL — ABNORMAL LOW (ref 0.3–1.2)
Total Protein: 7.8 g/dL (ref 6.0–8.3)

## 2014-01-15 LAB — URINALYSIS, ROUTINE W REFLEX MICROSCOPIC
BILIRUBIN URINE: NEGATIVE
GLUCOSE, UA: NEGATIVE mg/dL
Ketones, ur: NEGATIVE mg/dL
Leukocytes, UA: NEGATIVE
Nitrite: NEGATIVE
Protein, ur: NEGATIVE mg/dL
SPECIFIC GRAVITY, URINE: 1.019 (ref 1.005–1.030)
Urobilinogen, UA: 0.2 mg/dL (ref 0.0–1.0)
pH: 6 (ref 5.0–8.0)

## 2014-01-15 LAB — LIPASE, BLOOD: Lipase: 14 U/L (ref 11–59)

## 2014-01-15 LAB — URINE MICROSCOPIC-ADD ON

## 2014-01-15 NOTE — ED Notes (Signed)
Pt states she has had ABD pain, back pain, and n/v/d since today.

## 2014-01-16 ENCOUNTER — Encounter (HOSPITAL_COMMUNITY): Payer: Self-pay

## 2014-01-16 ENCOUNTER — Emergency Department (HOSPITAL_COMMUNITY): Payer: Medicare Other

## 2014-01-16 DIAGNOSIS — N200 Calculus of kidney: Secondary | ICD-10-CM | POA: Diagnosis not present

## 2014-01-16 LAB — CBC WITH DIFFERENTIAL/PLATELET
Basophils Absolute: 0 10*3/uL (ref 0.0–0.1)
Basophils Relative: 0 % (ref 0–1)
Eosinophils Absolute: 0 10*3/uL (ref 0.0–0.7)
Eosinophils Relative: 0 % (ref 0–5)
HCT: 37 % (ref 36.0–46.0)
Hemoglobin: 12.6 g/dL (ref 12.0–15.0)
Lymphocytes Relative: 9 % — ABNORMAL LOW (ref 12–46)
Lymphs Abs: 1.3 10*3/uL (ref 0.7–4.0)
MCH: 31.7 pg (ref 26.0–34.0)
MCHC: 34.1 g/dL (ref 30.0–36.0)
MCV: 93 fL (ref 78.0–100.0)
MONOS PCT: 3 % (ref 3–12)
Monocytes Absolute: 0.4 10*3/uL (ref 0.1–1.0)
Neutro Abs: 12.3 10*3/uL — ABNORMAL HIGH (ref 1.7–7.7)
Neutrophils Relative %: 88 % — ABNORMAL HIGH (ref 43–77)
PLATELETS: 340 10*3/uL (ref 150–400)
RBC: 3.98 MIL/uL (ref 3.87–5.11)
RDW: 14.1 % (ref 11.5–15.5)
WBC: 14 10*3/uL — AB (ref 4.0–10.5)

## 2014-01-16 LAB — I-STAT CG4 LACTIC ACID, ED: Lactic Acid, Venous: 1.79 mmol/L (ref 0.5–2.2)

## 2014-01-16 LAB — POC URINE PREG, ED: Preg Test, Ur: NEGATIVE

## 2014-01-16 MED ORDER — OXYCODONE-ACETAMINOPHEN 5-325 MG PO TABS: 1.0000 | ORAL_TABLET | ORAL | Status: DC | PRN

## 2014-01-16 MED ORDER — TAMSULOSIN HCL 0.4 MG PO CAPS: 0.4000 mg | ORAL_CAPSULE | Freq: Every day | ORAL | Status: DC

## 2014-01-16 MED ORDER — IOHEXOL 300 MG/ML  SOLN
100.0000 mL | Freq: Once | INTRAMUSCULAR | Status: AC | PRN
  Administered 2014-01-16: 100 mL via INTRAVENOUS

## 2014-01-16 MED ORDER — LOPERAMIDE HCL 2 MG PO CAPS
4.0000 mg | ORAL_CAPSULE | Freq: Once | ORAL | Status: AC
  Administered 2014-01-16: 4 mg via ORAL
  Filled 2014-01-16: qty 2

## 2014-01-16 MED ORDER — ONDANSETRON HCL 4 MG PO TABS: 4.0000 mg | ORAL_TABLET | Freq: Four times a day (QID) | ORAL | Status: DC

## 2014-01-16 MED ORDER — HYDROCODONE-ACETAMINOPHEN 5-325 MG PO TABS: 1.0000 | ORAL_TABLET | ORAL | Status: DC | PRN

## 2014-01-16 MED ORDER — SODIUM CHLORIDE 0.9 % IV BOLUS (SEPSIS)
1000.0000 mL | Freq: Once | INTRAVENOUS | Status: AC
  Administered 2014-01-16: 1000 mL via INTRAVENOUS

## 2014-01-16 MED ORDER — MORPHINE SULFATE 4 MG/ML IJ SOLN
4.0000 mg | Freq: Once | INTRAMUSCULAR | Status: AC
  Administered 2014-01-16: 4 mg via INTRAVENOUS
  Filled 2014-01-16: qty 1

## 2014-01-16 MED ORDER — IOHEXOL 300 MG/ML  SOLN
50.0000 mL | Freq: Once | INTRAMUSCULAR | Status: AC | PRN
  Administered 2014-01-16: 50 mL via ORAL

## 2014-01-16 MED ORDER — ONDANSETRON HCL 4 MG/2ML IJ SOLN
4.0000 mg | Freq: Once | INTRAMUSCULAR | Status: AC
Start: 2014-01-16 — End: 2014-01-16
  Administered 2014-01-16: 4 mg via INTRAVENOUS
  Filled 2014-01-16: qty 2

## 2014-01-16 MED ORDER — IBUPROFEN 800 MG PO TABS: 800.0000 mg | ORAL_TABLET | Freq: Three times a day (TID) | ORAL | Status: DC | PRN

## 2014-01-16 NOTE — ED Provider Notes (Signed)
TIME SEEN: 12:31 AM  CHIEF COMPLAINT: Abdominal pain, nausea, vomiting and diarrhea  HPI: Patient is a 48 year old female with history of nasopharyngeal cancer currently in remission, lupus and rheumatoid arthritis currently on methotrexate, COPD who presents to the emergency department with complaints of diarrhea that started last night, nausea and vomiting that started today and diffuse abdominal pain that she is unable to describe. No aggravating or relieving factors. No radiation. She is also had left flank pain and complaints of her chronic midthoracic back pain. She's had chills but no documented fever. No sick contacts or recent travel. No airbag use or hospitalization. She denies dysuria, hematuria, vaginal bleeding or discharge. Denies prior history of abdominal surgery. Denies recent injury to her back. No numbness, tingling or focal weakness. No bowel or bladder incontinence or urinary retention. No history of IV drug use, back surgery or recent epidural injections.  ROS: See HPI Constitutional: no fever  Eyes: no drainage  ENT: no runny nose   Cardiovascular:  no chest pain  Resp: no SOB  GI:  vomiting GU: no dysuria Integumentary: no rash  Allergy: no hives  Musculoskeletal: no leg swelling  Neurological: no slurred speech ROS otherwise negative  PAST MEDICAL HISTORY/PAST SURGICAL HISTORY:  Past Medical History  Diagnosis Date  . Lupus   . Cancer     nasopharenx  . 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 Irregular menstrual cycle   . Papanicolaou smear of cervix with atypical squamous cells of undetermined significance (ASC-US)   . Excessive or frequent menstruation   . Leiomyoma of uterus, unspecified   . Trichimoniasis   . Psoriasis   . Headache(784.0)   . GERD (gastroesophageal reflux disease)   . Arthritis     ra, lupus  . Anxiety   . Hypothyroidism     MEDICATIONS:  Prior to Admission medications   Medication Sig Start Date  End Date Taking? Authorizing Provider  albuterol (PROVENTIL HFA;VENTOLIN HFA) 108 (90 BASE) MCG/ACT inhaler Inhale 2 puffs into the lungs every 6 (six) hours as needed. For breathing    Historical Provider, MD  albuterol (PROVENTIL) (2.5 MG/3ML) 0.083% nebulizer solution Take 2.5 mg by nebulization every 6 (six) hours as needed for wheezing or shortness of breath.    Historical Provider, MD  AMETHIA 0.15-0.03 &0.01 MG tablet TAKE AS DIRECTED    Lahoma Crocker, MD  cyclobenzaprine (FLEXERIL) 10 MG tablet Take 10 mg by mouth 2 (two) times daily as needed. For pain    Historical Provider, MD  ferrous sulfate 325 (65 FE) MG tablet Take 325 mg by mouth daily with breakfast.    Historical Provider, MD  fexofenadine (ALLEGRA) 180 MG tablet Take 180 mg by mouth daily.    Historical Provider, MD  FOLIC ACID PO Take 2.8 mg by mouth daily.     Historical Provider, MD  furosemide (LASIX) 80 MG tablet Take 80 mg by mouth daily as needed for fluid.    Historical Provider, MD  gabapentin (NEURONTIN) 100 MG capsule Take 100 mg by mouth 3 (three) times daily.     Historical Provider, MD  hydrOXYzine (ATARAX/VISTARIL) 10 MG tablet Take 10 mg by mouth at bedtime as needed for itching or anxiety.     Historical Provider, MD  levothyroxine (SYNTHROID, LEVOTHROID) 75 MCG tablet Take 75 mcg by mouth daily.    Historical Provider, MD  LORazepam (ATIVAN) 1 MG tablet Take 1 mg by mouth at bedtime.  Historical Provider, MD  methotrexate (RHEUMATREX) 2.5 MG tablet Take 25 mg by mouth once a week. On mondays    Historical Provider, MD  Multiple Vitamin (MULTIVITAMIN WITH MINERALS) TABS Take 1 tablet by mouth daily.    Historical Provider, MD  pantoprazole (PROTONIX) 40 MG tablet Take 40 mg by mouth 2 (two) times daily.     Historical Provider, MD  topiramate (TOPAMAX) 100 MG tablet Take 100 mg by mouth at bedtime.     Historical Provider, MD  VITAMIN E PO Take 800 mg by mouth daily.     Historical Provider, MD  VOLTAREN  1 % GEL Apply 4 g topically 3 (three) times daily as needed (to left hip.).  05/26/13   Historical Provider, MD    ALLERGIES:  Allergies  Allergen Reactions  . Amoxicillin Itching  . Latex Itching  . Percocet [Oxycodone-Acetaminophen] Itching  . Plaquenil [Hydroxychloroquine Sulfate]     "Psoriasis."  . Bactrim [Sulfamethoxazole-Trimethoprim] Rash  . Ciprofloxacin Hcl Rash  . Keflex [Cephalexin] Rash  . Penicillins Rash  . Tetracyclines & Related Rash    SOCIAL HISTORY:  History  Substance Use Topics  . Smoking status: Former Smoker -- 0.25 packs/day for 8 years    Quit date: 02/28/2004  . Smokeless tobacco: Never Used  . Alcohol Use: No    FAMILY HISTORY: Family History  Problem Relation Age of Onset  . Lupus Brother     EXAM: BP 147/66  Pulse 98  Temp(Src) 99.3 F (37.4 C) (Oral)  Resp 18  Ht 5\' 6"  (1.676 m)  Wt 204 lb (92.534 kg)  BMI 32.94 kg/m2  SpO2 100%  LMP 01/01/2014 CONSTITUTIONAL: Alert and oriented and responds appropriately to questions. Well-appearing; well-nourished HEAD: Normocephalic EYES: Conjunctivae clear, PERRL ENT: normal nose; no rhinorrhea; moist mucous membranes; pharynx without lesions noted NECK: Supple, no meningismus, no LAD  CARD: RRR; S1 and S2 appreciated; no murmurs, no clicks, no rubs, no gallops RESP: Normal chest excursion without splinting or tachypnea; breath sounds clear and equal bilaterally; no wheezes, no rhonchi, no rales,  ABD/GI: Normal bowel sounds; non-distended; soft, diffusely tender to palpation without guarding or rebound, no peritoneal signs BACK:  The back appears normal and is non-tender to palpation, there is no CVA tenderness EXT: Normal ROM in all joints; non-tender to palpation; no edema; normal capillary refill; no cyanosis    SKIN: Normal color for age and race; warm NEURO: Moves all extremities equally, sensation to light touch intact diffusely, cranial nerves 2-12 intact, normal gait PSYCH: The  patient's mood and manner are appropriate. Grooming and personal hygiene are appropriate.  MEDICAL DECISION MAKING: Patient here with complaints of vomiting, diarrhea and diffuse abdominal pain. She is on methotrexate. Her labs show leukocytosis of 14 with left shift. She does have an elevated anion gap metabolic acidosis. We'll check lactate level. She does have large hemoglobin and calcium oxylate crystals. She states she has a history of kidney stones and had left flank pain earlier today but this resolved. No back pain that she is having currently is her chronic pain. She is neurologically intact. We'll give IV fluids, pain and nausea medication, obtain a CT of her abdomen and pelvis to evaluate for possible colitis, intra-abdominal abscess, or other intra-abdominal pathology.  ED PROGRESS: CT scan shows 6 mm left UVJ stone with slight hydronephrosis. She has urology outpatient followup information. We'll give Percocet, Zofran and Flomax. Have discussed with her to use Imodium over-the-counter as needed for diarrhea. I feel  she is safe to be discharged. She hasn't had any vomiting or diarrhea in the emergency department. She has been able to tolerate by mouth. Discussed return precautions. She agrees with plan.     [place], DO 01/16/14 [phone]

## 2014-01-16 NOTE — ED Notes (Signed)
Per nurse she will get labs

## 2014-01-16 NOTE — ED Notes (Signed)
Patient reports "all over" abdominal pain x1 day with N/V/D, urinary frequency, and pain radiating to lower back.

## 2014-01-16 NOTE — ED Notes (Signed)
Patient transported to CT 

## 2014-01-16 NOTE — Discharge Instructions (Signed)
Kidney Stones °Kidney stones (urolithiasis) are deposits that form inside your kidneys. The intense pain is caused by the stone moving through the urinary tract. When the stone moves, the ureter goes into spasm around the stone. The stone is usually passed in the urine.  °CAUSES  °· A disorder that makes certain neck glands produce too much parathyroid hormone (primary hyperparathyroidism). °· A buildup of uric acid crystals, similar to gout in your joints. °· Narrowing (stricture) of the ureter. °· A kidney obstruction present at birth (congenital obstruction). °· Previous surgery on the kidney or ureters. °· Numerous kidney infections. °SYMPTOMS  °· Feeling sick to your stomach (nauseous). °· Throwing up (vomiting). °· Blood in the urine (hematuria). °· Pain that usually spreads (radiates) to the groin. °· Frequency or urgency of urination. °DIAGNOSIS  °· Taking a history and physical exam. °· Blood or urine tests. °· CT scan. °· Occasionally, an examination of the inside of the urinary bladder (cystoscopy) is performed. °TREATMENT  °· Observation. °· Increasing your fluid intake. °· Extracorporeal shock wave lithotripsy--This is a noninvasive procedure that uses shock waves to break up kidney stones. °· Surgery may be needed if you have severe pain or persistent obstruction. There are various surgical procedures. Most of the procedures are performed with the use of small instruments. Only small incisions are needed to accommodate these instruments, so recovery time is minimized. °The size, location, and chemical composition are all important variables that will determine the proper choice of action for you. Talk to your health care provider to better understand your situation so that you will minimize the risk of injury to yourself and your kidney.  °HOME CARE INSTRUCTIONS  °· Drink enough water and fluids to keep your urine clear or pale yellow. This will help you to pass the stone or stone fragments. °· Strain  all urine through the provided strainer. Keep all particulate matter and stones for your health care provider to see. The stone causing the pain may be as small as a grain of salt. It is very important to use the strainer each and every time you pass your urine. The collection of your stone will allow your health care provider to analyze it and verify that a stone has actually passed. The stone analysis will often identify what you can do to reduce the incidence of recurrences. °· Only take over-the-counter or prescription medicines for pain, discomfort, or fever as directed by your health care provider. °· Make a follow-up appointment with your health care provider as directed. °· Get follow-up X-rays if required. The absence of pain does not always mean that the stone has passed. It may have only stopped moving. If the urine remains completely obstructed, it can cause loss of kidney function or even complete destruction of the kidney. It is your responsibility to make sure X-rays and follow-ups are completed. Ultrasounds of the kidney can show blockages and the status of the kidney. Ultrasounds are not associated with any radiation and can be performed easily in a matter of minutes. °SEEK MEDICAL CARE IF: °· You experience pain that is progressive and unresponsive to any pain medicine you have been prescribed. °SEEK IMMEDIATE MEDICAL CARE IF:  °· Pain cannot be controlled with the prescribed medicine. °· You have a fever or shaking chills. °· The severity or intensity of pain increases over 18 hours and is not relieved by pain medicine. °· You develop a new onset of abdominal pain. °· You feel faint or pass out. °·   You are unable to urinate. MAKE SURE YOU:   Understand these instructions.  Will watch your condition.  Will get help right away if you are not doing well or get worse. Document Released: 06/07/2005 Document Revised: 02/07/2013 Document Reviewed: 11/08/2012 [facility] Patient Information 2015  [place], [place]. This information is not intended to replace advice given to you by your health care provider. Make sure you discuss any questions you have with your health care provider.  Diarrhea Diarrhea is frequent loose and watery bowel movements. It can cause you to feel weak and dehydrated. Dehydration can cause you to become tired and thirsty, have a dry mouth, and have decreased urination that often is dark yellow. Diarrhea is a sign of another problem, most often an infection that will not last long. In most cases, diarrhea typically lasts 2-3 days. However, it can last longer if it is a sign of something more serious. It is important to treat your diarrhea as directed by your caregiver to lessen or prevent future episodes of diarrhea. CAUSES  Some common causes include:  Gastrointestinal infections caused by viruses, bacteria, or parasites.  Food poisoning or food allergies.  Certain medicines, such as antibiotics, chemotherapy, and laxatives.  Artificial sweeteners and fructose.  Digestive disorders. HOME CARE INSTRUCTIONS  Ensure adequate fluid intake (hydration): Have 1 cup (8 oz) of fluid for each diarrhea episode. Avoid fluids that contain simple sugars or sports drinks, fruit juices, whole milk products, and sodas. Your urine should be clear or pale yellow if you are drinking enough fluids. Hydrate with an oral rehydration solution that you can purchase at pharmacies, retail stores, and online. You can prepare an oral rehydration solution at home by mixing the following ingredients together:   - tsp table salt.   tsp baking soda.   tsp salt substitute containing potassium chloride.  1  tablespoons sugar.  1 L (34 oz) of water.  Certain foods and beverages may increase the speed at which food moves through the gastrointestinal (GI) tract. These foods and beverages should be avoided and include:  Caffeinated and alcoholic beverages.  High-fiber foods, such as raw  fruits and vegetables, nuts, seeds, and whole grain breads and cereals.  Foods and beverages sweetened with sugar alcohols, such as xylitol, sorbitol, and mannitol.  Some foods may be well tolerated and may help thicken stool including:  Starchy foods, such as rice, toast, pasta, low-sugar cereal, oatmeal, grits, baked potatoes, crackers, and bagels.  Bananas.  Applesauce.  Add probiotic-rich foods to help increase healthy bacteria in the GI tract, such as yogurt and fermented milk products.  Wash your hands well after each diarrhea episode.  Only take over-the-counter or prescription medicines as directed by your caregiver.  Take a warm bath to relieve any burning or pain from frequent diarrhea episodes. SEEK IMMEDIATE MEDICAL CARE IF:   You are unable to keep fluids down.  You have persistent vomiting.  You have blood in your stool, or your stools are black and tarry.  You do not urinate in 6-8 hours, or there is only a small amount of very dark urine.  You have abdominal pain that increases or localizes.  You have weakness, dizziness, confusion, or light-headedness.  You have a severe headache.  Your diarrhea gets worse or does not get better.  You have a fever or persistent symptoms for more than 2-3 days.  You have a fever and your symptoms suddenly get worse. MAKE SURE YOU:   Understand these instructions.  Will  watch your condition.  Will get help right away if you are not doing well or get worse. Document Released: 05/28/2002 Document Revised: 10/22/2013 Document Reviewed: 02/13/2012 [facility] Patient Information 2015 [place], [place]. This information is not intended to replace advice given to you by your health care provider. Make sure you discuss any questions you have with your health care provider.

## 2014-01-23 ENCOUNTER — Encounter: Payer: Self-pay | Admitting: Obstetrics & Gynecology

## 2014-01-23 ENCOUNTER — Ambulatory Visit (INDEPENDENT_AMBULATORY_CARE_PROVIDER_SITE_OTHER): Payer: PRIVATE HEALTH INSURANCE | Admitting: Obstetrics & Gynecology

## 2014-01-23 ENCOUNTER — Ambulatory Visit: Payer: PRIVATE HEALTH INSURANCE | Admitting: Obstetrics & Gynecology

## 2014-01-23 VITALS — BP 118/85 | HR 109 | Temp 98.1°F | Ht 66.0 in | Wt 200.0 lb

## 2014-01-23 DIAGNOSIS — Z01419 Encounter for gynecological examination (general) (routine) without abnormal findings: Secondary | ICD-10-CM

## 2014-01-23 DIAGNOSIS — Z113 Encounter for screening for infections with a predominantly sexual mode of transmission: Secondary | ICD-10-CM

## 2014-01-23 NOTE — Patient Instructions (Signed)

## 2014-01-23 NOTE — Progress Notes (Signed)
Subjective:     [name] is a 48 y.o. female here for a routine exam.  Current complaints: none.    Personal health questionnaire:  Is patient Ashkenazi Jewish, have a family history of breast and/or ovarian cancer: no Is there a family history of uterine cancer diagnosed at age < 22, gastrointestinal cancer, urinary tract cancer, family member who is a Field seismologist syndrome-associated carrier: no Is the patient overweight and hypertensive, family history of diabetes, personal history of gestational diabetes or PCOS: yes Is patient over 89, have PCOS,  family history of premature CHD under age 24, diabetes, smoke, have hypertension or peripheral artery disease:  yes At any time, has a partner hit, kicked or otherwise hurt or frightened you?: no Over the past 2 weeks, have you felt down, depressed or hopeless?: no Over the past 2 weeks, have you felt little interest or pleasure in doing things?:no   Gynecologic History Patient's last menstrual period was 01/01/2014. Contraception: oral progesterone-only contraceptive Last Pap: 2014. Results were: normal Last mammogram: results were: normal  Obstetric History OB History  Gravida Para Term Preterm AB SAB TAB Ectopic Multiple Living  2 1  1 1 1    1     # Outcome Date GA Lbr Len/2nd Weight Sex Delivery Anes PTL Lv  2 PRE  [redacted]w[redacted]d       Y  1 SAB  [redacted]w[redacted]d       N      Past Medical History  Diagnosis Date  . Lupus   . Cancer     nasopharenx  . 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 Irregular menstrual cycle   . Papanicolaou smear of cervix with atypical squamous cells of undetermined significance (ASC-US)   . Excessive or frequent menstruation   . Leiomyoma of uterus, unspecified   . Trichimoniasis   . Psoriasis   . Headache(784.0)   . GERD (gastroesophageal reflux disease)   . Arthritis     ra, lupus  . Anxiety   . Hypothyroidism     Past Surgical History  Procedure Laterality Date  . Ankle  arthroscopy    . Cesarean section    . Lymph node biopsy      neck  . Multiple extractions with alveoloplasty N/A 03/05/2013    Procedure: MULTIPLE EXTRACION WITH ALVEOLOPLASTY, extraction of decayed teeth numbers 3,4,5,18,19,22,23,24,25, extraction of retained root tips teeth numbers 2,20,21,26, bilateral mandibular alveoloplasty and upper right maxillary alveoloplasty;  Surgeon: Isac Caddy, DDS;  Location: [place];  Service: Oral Surgery;  Laterality: N/A;  . Tooth extraction Bilateral 03/05/2013    Procedure: EXTRACTION MOLARS;  Surgeon: Isac Caddy, DDS;  Location: [place];  Service: Oral Surgery;  Laterality: Bilateral;    Current outpatient prescriptions:albuterol (PROVENTIL HFA;VENTOLIN HFA) 108 (90 BASE) MCG/ACT inhaler, Inhale 2 puffs into the lungs every 6 (six) hours as needed. For breathing, Disp: , Rfl: ;  albuterol (PROVENTIL) (2.5 MG/3ML) 0.083% nebulizer solution, Take 2.5 mg by nebulization every 6 (six) hours as needed for wheezing or shortness of breath., Disp: , Rfl: ;  cetirizine (ZYRTEC) 10 MG tablet, Take 10 mg by mouth daily., Disp: , Rfl:  cyclobenzaprine (FLEXERIL) 10 MG tablet, Take 10 mg by mouth 2 (two) times daily as needed. For pain, Disp: , Rfl: ;  ferrous sulfate 325 (65 FE) MG tablet, Take 325 mg by mouth daily with breakfast., Disp: , Rfl: ;  folic acid (FOLVITE) 026 MCG tablet, Take  2,800 mcg by mouth daily., Disp: , Rfl: ;  furosemide (LASIX) 80 MG tablet, Take 80 mg by mouth daily as needed for fluid., Disp: , Rfl:  gabapentin (NEURONTIN) 100 MG capsule, Take 100 mg by mouth 3 (three) times daily. , Disp: , Rfl: ;  HYDROcodone-acetaminophen (NORCO/VICODIN) 5-325 MG per tablet, Take 1-2 tablets by mouth every 4 (four) hours as needed., Disp: 20 tablet, Rfl: 0;  hydrOXYzine (ATARAX/VISTARIL) 10 MG tablet, Take 10 mg by mouth at bedtime as needed for itching or anxiety. , Disp: , Rfl:  Levonorgestrel-Ethinyl Estradiol (AMETHIA) 0.15-0.03 &0.01 MG tablet,  Take 1 tablet by mouth daily., Disp: , Rfl: ;  levothyroxine (SYNTHROID, LEVOTHROID) 75 MCG tablet, Take 75 mcg by mouth daily., Disp: , Rfl: ;  LORazepam (ATIVAN) 1 MG tablet, Take 1 mg by mouth at bedtime. , Disp: , Rfl: ;  methotrexate (RHEUMATREX) 2.5 MG tablet, Take 25 mg by mouth once a week. On mondays, Disp: , Rfl:  Multiple Vitamin (MULTIVITAMIN WITH MINERALS) TABS, Take 1 tablet by mouth daily., Disp: , Rfl: ;  ondansetron (ZOFRAN) 4 MG tablet, Take 1 tablet (4 mg total) by mouth every 6 (six) hours., Disp: 12 tablet, Rfl: 0;  pantoprazole (PROTONIX) 40 MG tablet, Take 40 mg by mouth 2 (two) times daily. , Disp: , Rfl: ;  tamsulosin (FLOMAX) 0.4 MG CAPS capsule, Take 1 capsule (0.4 mg total) by mouth daily., Disp: 30 capsule, Rfl: 0 topiramate (TOPAMAX) 100 MG tablet, Take 100 mg by mouth at bedtime. , Disp: , Rfl: ;  VITAMIN E PO, Take 400 mg by mouth 2 (two) times daily. , Disp: , Rfl: ;  VOLTAREN 1 % GEL, Apply 4 g topically 3 (three) times daily as needed (to left hip.). , Disp: , Rfl:  Allergies  Allergen Reactions  . Amoxicillin Itching  . Latex Itching  . Percocet [Oxycodone-Acetaminophen] Itching  . Plaquenil [Hydroxychloroquine Sulfate]     "Psoriasis."  . Bactrim [Sulfamethoxazole-Trimethoprim] Rash  . Ciprofloxacin Hcl Rash  . Keflex [Cephalexin] Rash  . Penicillins Rash  . Tetracyclines & Related Rash    History  Substance Use Topics  . Smoking status: Former Smoker -- 0.25 packs/day for 8 years    Quit date: 02/28/2004  . Smokeless tobacco: Never Used  . Alcohol Use: No    Family History  Problem Relation Age of Onset  . Lupus Brother       Review of Systems  Constitutional: negative for fatigue and weight loss Respiratory: negative for cough and wheezing Cardiovascular: negative for chest pain, fatigue and palpitations Gastrointestinal: negative for abdominal pain and change in bowel habits Musculoskeletal:negative for myalgias Neurological: negative for  gait problems and tremors Behavioral/Psych: negative for abusive relationship, depression Endocrine: negative for temperature intolerance   Genitourinary:negative for abnormal menstrual periods, genital lesions, hot flashes, sexual problems and vaginal discharge Integument/breast: negative for breast lump, breast tenderness, nipple discharge and skin lesion(s)    Objective:       BP 118/85  Pulse 109  Temp(Src) 98.1 F (36.7 C)  Ht 5\' 6"  (1.676 m)  Wt 90.719 kg (200 lb)  BMI 32.30 kg/m2  LMP 01/01/2014 General:   alert  Skin:   no rash or abnormalities  Lungs:   clear to auscultation bilaterally  Heart:   regular rate and rhythm, S1, S2 normal, no murmur, click, rub or gallop  Breasts:   normal without suspicious masses, skin or nipple changes or axillary nodes  Abdomen:  normal findings: no organomegaly,  soft, non-tender and no hernia  Pelvis:  External genitalia: normal general appearance Urinary system: urethral meatus normal and bladder without fullness, nontender Vaginal: normal without tenderness, induration or masses Cervix: normal appearance Adnexa: normal bimanual exam Uterus: anteverted and non-tender, normal size   Lab Review  Labs reviewed no Radiologic studies reviewed yes    Assessment:    Healthy female exam.    Plan:    Education reviewed: calcium supplements, low fat, low cholesterol diet, safe sex/STD prevention and weight bearing exercise.    Orders Placed This Encounter  Procedures  . HIV antibody  . RPR    Possible management options include: may need annual Pap if immunosuppressive therapy resumed Follow up as needed.

## 2014-01-24 ENCOUNTER — Ambulatory Visit: Payer: Medicare Other

## 2014-01-24 LAB — RPR

## 2014-01-24 LAB — HIV ANTIBODY (ROUTINE TESTING W REFLEX): HIV 1&2 Ab, 4th Generation: NONREACTIVE

## 2014-03-08 ENCOUNTER — Other Ambulatory Visit: Payer: Self-pay

## 2014-03-08 DIAGNOSIS — Z1231 Encounter for screening mammogram for malignant neoplasm of breast: Secondary | ICD-10-CM

## 2014-03-18 ENCOUNTER — Ambulatory Visit: Payer: Medicare Other

## 2014-03-26 ENCOUNTER — Ambulatory Visit: Payer: Medicare Other

## 2014-03-27 ENCOUNTER — Ambulatory Visit
Admission: RE | Admit: 2014-03-27 | Discharge: 2014-03-27 | Disposition: A | Discharge status: Home / Self Care | Payer: Medicare Other | Source: Ambulatory Visit

## 2014-03-27 DIAGNOSIS — Z1231 Encounter for screening mammogram for malignant neoplasm of breast: Secondary | ICD-10-CM

## 2014-03-29 ENCOUNTER — Other Ambulatory Visit: Payer: Self-pay | Admitting: Internal Medicine

## 2014-03-29 DIAGNOSIS — R928 Other abnormal and inconclusive findings on diagnostic imaging of breast: Secondary | ICD-10-CM

## 2014-04-05 ENCOUNTER — Ambulatory Visit
Admission: RE | Admit: 2014-04-05 | Discharge: 2014-04-05 | Disposition: A | Discharge status: Home / Self Care | Payer: Medicare Other | Source: Ambulatory Visit | Attending: Internal Medicine | Admitting: Internal Medicine

## 2014-04-05 ENCOUNTER — Other Ambulatory Visit: Payer: Self-pay | Admitting: Obstetrics & Gynecology

## 2014-04-05 ENCOUNTER — Other Ambulatory Visit: Payer: Self-pay | Admitting: Internal Medicine

## 2014-04-05 DIAGNOSIS — R928 Other abnormal and inconclusive findings on diagnostic imaging of breast: Secondary | ICD-10-CM

## 2014-04-22 ENCOUNTER — Encounter: Payer: Self-pay | Admitting: Obstetrics & Gynecology

## 2014-04-24 ENCOUNTER — Other Ambulatory Visit (HOSPITAL_COMMUNITY): Payer: Self-pay | Admitting: Rheumatology

## 2014-04-24 ENCOUNTER — Ambulatory Visit (HOSPITAL_COMMUNITY)
Admission: RE | Admit: 2014-04-24 | Discharge: 2014-04-24 | Disposition: A | Discharge status: Home / Self Care | Payer: PRIVATE HEALTH INSURANCE | Source: Ambulatory Visit | Attending: Rheumatology | Admitting: Rheumatology

## 2014-04-24 DIAGNOSIS — R0602 Shortness of breath: Secondary | ICD-10-CM | POA: Insufficient documentation

## 2014-04-24 DIAGNOSIS — D849 Immunodeficiency, unspecified: Secondary | ICD-10-CM

## 2014-04-24 DIAGNOSIS — R079 Chest pain, unspecified: Secondary | ICD-10-CM | POA: Insufficient documentation

## 2014-04-24 DIAGNOSIS — D899 Disorder involving the immune mechanism, unspecified: Secondary | ICD-10-CM

## 2014-05-06 ENCOUNTER — Ambulatory Visit (INDEPENDENT_AMBULATORY_CARE_PROVIDER_SITE_OTHER): Payer: Medicare Other | Admitting: Internal Medicine

## 2014-05-06 ENCOUNTER — Encounter: Payer: Self-pay | Admitting: Internal Medicine

## 2014-05-06 VITALS — BP 118/80 | HR 90 | Ht 66.0 in | Wt 169.6 lb

## 2014-05-06 DIAGNOSIS — R918 Other nonspecific abnormal finding of lung field: Secondary | ICD-10-CM

## 2014-05-06 DIAGNOSIS — R091 Pleurisy: Secondary | ICD-10-CM

## 2014-05-06 NOTE — Progress Notes (Signed)
Subjective:    Patient ID: [name], female    DOB: 1966-06-14  MRN: [id number]    Brief patient profile:  39 yobf quit smoking 2005 with dx of nasal ca/ s/p radiation  On prednisone since about the same time for dx of sle  started on mtx 2012 and tapered down to 2.5 mg mid Oct 2014  which corresponded to onset  of cp and sob referred to pulmonary p admit for eval of Ant  R pleuritic chest pain, hacking dry cough with intermittent hemoptysis as follows   Admit date: 06/19/2013  Discharge date: 06/21/2012   Patient was unhappy that her chronic symptoms were not resolved prior to discharge. She complained that the admitting doctor told her that her pleurisy would be resolved before discharge.  Recommendations for Outpatient Follow-up:   Appt with Dr. Melvyn Novas 06/29/12 for follow up - dyspnea, chronic cough, min. Hemoptysis.  Follow up with Dr. Trudie Reed as previously scheduled on 07/12/13 for lupus.  Discharge Diagnoses:   Pleurisy   Chronic use of steroids  Lupus (systemic lupus erythematosus)  Psoriasis  GERD (gastroesophageal reflux disease)  Hypothyroidism  Discharge Condition: stable, still with dyspnea and cough. Condition is chronic and stable for outpatient follow up.  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 Mohamed and Lowell, lupus and psoriasis (rheumatologist is Dr. Trudie Reed). Presents with two months of dyspnea and cough. Her shortness of breath is unchanged at rest vs with mobility. She has taken courses of clinda and levaquin without improvement. She has been tapering down her chronic prednisone for 6 months. She mentions that 12/29 she had an episode of hemoptysis x sev tsp of brb Hospital Course:  SOB with cough  CT Angio completed 12/22 shows no  PE  Discussed via telephone with Dr. Elsworth Soho. He was unimpressed with ground glass opacity on CT & recommended OP follow up.  Appointment scheduled with Dr. Melvyn Novas.  Patient being treated without antibiotics.  Slight improvement on steroids.  Patient stable and can be treated as outpatient.  Will check 2D echo and BNP before she goes. Results to be followed outpatient.  Lupus  Per Dr. Trudie Reed Outpatient  On prednisone. She received solumedrol in ED and admitted increased her PO dose to 20mg  daily.  Psoriasis  Notable on skin but appears controlled.  Prednisone.  Hypothyroidism  Synthroid    06/29/2013 new pt eval  Melvyn Novas Chief Complaint  Patient presents with  . Pulmonary Consult    referred by Dr. Conley Canal for SOB and Hemoptysis.  last dose of Prednisone 06/25/13 felt worse since, cp was better while on higher doses of steroids and now is bialteral band like pleuritica around lower chest but worst on R below and just lateral to  R breast  Last levaquin 06/29/13 no fever or purulent sputum, no further hemoptysis. Sob x more than slow adls rec Stop fosfamax  protonix (pantoprazole)  Take 30- 60 min before your first and last meals of the day  Prednisone 10 mg take 2 with bfast and supper until better (or you see Dr Trudie Reed) then take one twice daily > never took it > saw [facility] 07/18/13    07/31/2013 f/u ov/[name] re: back  On  mtx and prn celebrex and maint symbicort  Chief Complaint  Patient presents with  . Follow-up    Review pft.  Pt denies any breathing problems at this time.  Not limited by breathing from desired activities  No need for prn saba arthritis even better rec Ok to take symbicort if you feel it really helps your breathing F/u is prn      05/06/2014 acute  ov/[name] re: recurrent pleuritic R cp deveshar Chief Complaint  Patient presents with  . Acute Visit    Pt c/o pain under rt breast "comes and goes".  She states that this has affected her breathing for the past month or  so- relates to weather change.   exact same area, this time with sob even if not hurting though pain "cuts off her breathing" with exertion and when she says "come and goes" she means always has it if she takes a deep breath/ presently just on mtx and started back on symbicort at onset but no better. Some better with vicodin     No obvious day to day or daytime variabilty or assoc chronic cough or  chest tightness, subjective wheeze overt sinus or hb symptoms. No unusual exp hx or h/o childhood pna/ asthma or knowledge of premature birth.  Sleeping ok without nocturnal  or early am exacerbation  of respiratory  c/o's or need for noct saba. Also denies any obvious fluctuation of symptoms with weather or environmental changes or other aggravating or alleviating factors except as outlined above   Current Medications, Allergies, Complete Past Medical History, Past Surgical History, Family History, and Social History were reviewed in [employer] record.  ROS  The following are not active complaints unless bolded sore throat, dysphagia, dental problems, itching, sneezing,  nasal congestion or excess/ purulent secretions, ear ache,   fever, chills, sweats, unintended wt loss, pleuritic or exertional cp, hemoptysis,  orthopnea pnd or leg swelling, presyncope, palpitations, heartburn, abdominal pain, anorexia, nausea, vomiting, diarrhea  or change in bowel or urinary habits, change in stools or urine, dysuria,hematuria,  rash, arthralgias worse than usual, visual complaints, headache, numbness weakness or ataxia or problems with walking or coordination,  change in mood/affect or memory.          Objective:   Physical Exam  amb mod  hoarse bf nad  07/31/2013       204 > 05/06/2014  170   Wt Readings from Last 3 Encounters:  06/29/13 214 lb (97.07 kg)  06/21/13 207 lb 1.6 oz (93.94 kg)  06/11/13 216 lb (97.977 kg)      HEENT: nl dentition, turbinates, and orophanx. Nl  external ear canals without cough reflex   NECK :  without JVD/Nodes/TM/ nl carotid upstrokes bilaterally   LUNGS: no acc muscle use, clear to A and P bilaterally without cough on insp or exp maneuvers - no rub appreciated    CV:  RRR  no s3 or murmur or increase in P2, no edema   ABD:  soft and nontender with nl excursion in the supine position. No bruits or organomegaly, bowel sounds nl  MS:  warm without deformities, calf tenderness, cyanosis or clubbing  SKIN: warm and dry without lesions / severe psoriasis    NEURO:  alert, approp, no deficits      cxr 05/04/14  No active cardiopulmonary disease.     Assessment & Plan:

## 2014-05-06 NOTE — Patient Instructions (Addendum)
Prednisone 10 mg Take 4 for three days 3 for three days 2 for three days 1 for three days and stop - generally taken at breakfast   If not better by Friday Nov 20th call for CT chest

## 2014-05-07 NOTE — Assessment & Plan Note (Addendum)
See CT chest 06/11/13 1. No evidence of a pulmonary embolus.  2. Mild patchy infiltrate in the left lower lobe and minimally in  posterior inferior left upper lobe associated with mild left  infrahilar and subcarinal reactive adenopathy. - PFT's 07/31/2013  2.47 VC (84%)  and no obst,  DLCO 68% and corrects to 94%  p am symbicort  ERV 35%  - cxr clear 07/31/2013 and 04/24/14   No evidence of lupus pneumonitis or airway involvement and note no better with symbicort so ok to d/c

## 2014-05-07 NOTE — Assessment & Plan Note (Signed)
the fact that the pain is in the exact same location as prev episodes is strongly in favor of lupus pleuritis or pleuritis assoc with connective tissue dz and strongly against pe or pna clinically   rec 12 day of prednisone   CTa chest if not improving in 5 days

## 2014-06-17 ENCOUNTER — Encounter: Payer: Self-pay | Admitting: *Deleted

## 2014-06-18 ENCOUNTER — Encounter: Payer: Self-pay | Admitting: Obstetrics & Gynecology

## 2014-07-15 ENCOUNTER — Telehealth: Payer: Self-pay | Admitting: *Deleted

## 2014-07-15 NOTE — Telephone Encounter (Signed)
Patient called with questions about AUB. Patient is using Seasonique and had some spotting after intercourse. Patient states she takes Folic acid and Stellaria for her rheumatoid arthritis. She has a history of fibroids and had just finished a Rx for Levaquin. Patient states she is with the same partner for 4 years and she has been recently tested for infection. She does not have pain and thinks the bleeding may be related to her fibroids. She has not forgotten any pills. Told patient not sure why she had the bleeding could be any number of reasons- but if she should have a reoccurrence, she should call so we can bring her in for assessment.

## 2014-07-19 ENCOUNTER — Other Ambulatory Visit: Payer: Self-pay | Admitting: *Deleted

## 2014-07-19 DIAGNOSIS — Z3041 Encounter for surveillance of contraceptive pills: Secondary | ICD-10-CM

## 2014-07-19 MED ORDER — LEVONORGEST-ETH ESTRAD 91-DAY 0.15-0.03 &0.01 MG PO TABS: ORAL_TABLET | ORAL | Status: DC

## 2014-07-29 ENCOUNTER — Ambulatory Visit: Payer: PRIVATE HEALTH INSURANCE | Admitting: Obstetrics & Gynecology

## 2014-08-26 ENCOUNTER — Emergency Department (HOSPITAL_COMMUNITY): Payer: Medicare Other

## 2014-08-26 ENCOUNTER — Emergency Department (HOSPITAL_COMMUNITY)
Admission: EM | Admit: 2014-08-26 | Discharge: 2014-08-26 | Disposition: A | Discharge status: Home / Self Care | Payer: Medicare Other | Attending: Emergency Medicine | Admitting: Emergency Medicine

## 2014-08-26 ENCOUNTER — Encounter (HOSPITAL_COMMUNITY): Payer: Self-pay | Admitting: Emergency Medicine

## 2014-08-26 DIAGNOSIS — Z87891 Personal history of nicotine dependence: Secondary | ICD-10-CM | POA: Insufficient documentation

## 2014-08-26 DIAGNOSIS — E039 Hypothyroidism, unspecified: Secondary | ICD-10-CM | POA: Insufficient documentation

## 2014-08-26 DIAGNOSIS — Z8619 Personal history of other infectious and parasitic diseases: Secondary | ICD-10-CM | POA: Diagnosis not present

## 2014-08-26 DIAGNOSIS — Z79899 Other long term (current) drug therapy: Secondary | ICD-10-CM | POA: Insufficient documentation

## 2014-08-26 DIAGNOSIS — Z3202 Encounter for pregnancy test, result negative: Secondary | ICD-10-CM | POA: Diagnosis not present

## 2014-08-26 DIAGNOSIS — Z8742 Personal history of other diseases of the female genital tract: Secondary | ICD-10-CM | POA: Diagnosis not present

## 2014-08-26 DIAGNOSIS — Z91040 Latex allergy status: Secondary | ICD-10-CM | POA: Insufficient documentation

## 2014-08-26 DIAGNOSIS — R079 Chest pain, unspecified: Secondary | ICD-10-CM | POA: Insufficient documentation

## 2014-08-26 DIAGNOSIS — J029 Acute pharyngitis, unspecified: Secondary | ICD-10-CM | POA: Insufficient documentation

## 2014-08-26 DIAGNOSIS — E119 Type 2 diabetes mellitus without complications: Secondary | ICD-10-CM | POA: Diagnosis not present

## 2014-08-26 DIAGNOSIS — Z85818 Personal history of malignant neoplasm of other sites of lip, oral cavity, and pharynx: Secondary | ICD-10-CM | POA: Diagnosis not present

## 2014-08-26 DIAGNOSIS — Z8659 Personal history of other mental and behavioral disorders: Secondary | ICD-10-CM | POA: Insufficient documentation

## 2014-08-26 DIAGNOSIS — Z880 Allergy status to penicillin: Secondary | ICD-10-CM | POA: Diagnosis not present

## 2014-08-26 DIAGNOSIS — K219 Gastro-esophageal reflux disease without esophagitis: Secondary | ICD-10-CM | POA: Insufficient documentation

## 2014-08-26 DIAGNOSIS — M542 Cervicalgia: Secondary | ICD-10-CM | POA: Diagnosis present

## 2014-08-26 DIAGNOSIS — Z872 Personal history of diseases of the skin and subcutaneous tissue: Secondary | ICD-10-CM | POA: Insufficient documentation

## 2014-08-26 LAB — CBC
HCT: 33.3 % — ABNORMAL LOW (ref 36.0–46.0)
HEMOGLOBIN: 11.4 g/dL — AB (ref 12.0–15.0)
MCH: 30.6 pg (ref 26.0–34.0)
MCHC: 34.2 g/dL (ref 30.0–36.0)
MCV: 89.5 fL (ref 78.0–100.0)
Platelets: 354 10*3/uL (ref 150–400)
RBC: 3.72 MIL/uL — ABNORMAL LOW (ref 3.87–5.11)
RDW: 14 % (ref 11.5–15.5)
WBC: 7.3 10*3/uL (ref 4.0–10.5)

## 2014-08-26 LAB — BASIC METABOLIC PANEL
ANION GAP: 8 (ref 5–15)
BUN: 5 mg/dL — ABNORMAL LOW (ref 6–23)
CALCIUM: 8.6 mg/dL (ref 8.4–10.5)
CHLORIDE: 112 mmol/L (ref 96–112)
CO2: 19 mmol/L (ref 19–32)
Creatinine, Ser: 0.83 mg/dL (ref 0.50–1.10)
GFR calc non Af Amer: 81 mL/min — ABNORMAL LOW (ref >90)
Glucose, Bld: 91 mg/dL (ref 70–99)
POTASSIUM: 3.8 mmol/L (ref 3.5–5.1)
Sodium: 139 mmol/L (ref 135–145)

## 2014-08-26 MED ORDER — MORPHINE SULFATE 4 MG/ML IJ SOLN
4.0000 mg | Freq: Once | INTRAMUSCULAR | Status: AC
  Administered 2014-08-26: 4 mg via INTRAVENOUS
  Filled 2014-08-26: qty 1

## 2014-08-26 MED ORDER — NAPROXEN 375 MG PO TABS: 375.0000 mg | ORAL_TABLET | Freq: Two times a day (BID) | ORAL | Status: DC

## 2014-08-26 MED ORDER — IOHEXOL 300 MG/ML  SOLN
100.0000 mL | Freq: Once | INTRAMUSCULAR | Status: AC | PRN
  Administered 2014-08-26: 100 mL via INTRAVENOUS

## 2014-08-26 MED ORDER — HYDROCODONE-ACETAMINOPHEN 5-325 MG PO TABS: 1.0000 | ORAL_TABLET | ORAL | Status: DC | PRN

## 2014-08-26 NOTE — ED Notes (Signed)
PT TRANSPORTED TO CT

## 2014-08-26 NOTE — ED Notes (Signed)
MD at bedside. 

## 2014-08-26 NOTE — ED Notes (Signed)
Pt sts pain in right side of neck and throat into collar bone area x several days; pt sts some difficulty swallowing pills at times

## 2014-08-26 NOTE — ED Notes (Signed)
CT called to make aware pt ready for Scan.

## 2014-08-26 NOTE — ED Provider Notes (Signed)
CSN: [id number]     Arrival date & time 08/26/14  1259 History   First MD Initiated Contact with Patient 08/26/14 1453     Chief Complaint  Patient presents with  . Sore Throat  . Neck Pain     (Consider location/radiation/quality/duration/timing/severity/associated sxs/prior Treatment) HPI   49 year old female with remote history of nasopharyngeal cancer status post resection, currently in remission, diabetes, psoriatic and rheumatoid arthritis who presents with a one-month history of right clavicle and right sided anterior neck pain. She states that over the last 2 weeks, this pain has worsened. She describes the pain as a dull, aching sensation, just deep and over her clavicle that is made worse with direct palpation. Denies any worsening with exertion and denies any associated chest pain.. Over the last 2 weeks, she has also noticed difficulty swallowing solids, although she's had no vomiting and she has been able to tolerate a full diet. Over the last 2 days, she noticed a painful knot on the right side of her neck. She states that her clavicle pain has also worsened since the onset of the knot. She states that she is concerned given her history of cancer and history of lymph node dissection in this area. Denies any new trauma to the area. Denies any fevers or chills. No vision changes or neurological deficits. Denies any weight loss or night sweats. She has been taking her medications for arthritis as prescribed.  Past Medical History  Diagnosis Date  . Lupus   . Cancer     nasopharenx  . 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 Irregular menstrual cycle   . Papanicolaou smear of cervix with atypical squamous cells of undetermined significance (ASC-US)   . Excessive or frequent menstruation   . Leiomyoma of uterus, unspecified   . Trichimoniasis   . Psoriasis   . Headache(784.0)   . GERD (gastroesophageal reflux disease)   . Arthritis     ra, lupus   . Anxiety   . Hypothyroidism    Past Surgical History  Procedure Laterality Date  . Ankle arthroscopy    . Cesarean section    . Lymph node biopsy      neck  . Multiple extractions with alveoloplasty N/A 03/05/2013    Procedure: MULTIPLE EXTRACION WITH ALVEOLOPLASTY, extraction of decayed teeth numbers 3,4,5,18,19,22,23,24,25, extraction of retained root tips teeth numbers 2,20,21,26, bilateral mandibular alveoloplasty and upper right maxillary alveoloplasty;  Surgeon: Isac Caddy, DDS;  Location: [facility];  Service: Oral Surgery;  Laterality: N/A;  . Tooth extraction Bilateral 03/05/2013    Procedure: EXTRACTION MOLARS;  Surgeon: Isac Caddy, DDS;  Location: [facility];  Service: Oral Surgery;  Laterality: Bilateral;   Family History  Problem Relation Age of Onset  . Lupus Brother    History  Substance Use Topics  . Smoking status: Former Smoker -- 0.25 packs/day for 8 years    Quit date: 02/28/2004  . Smokeless tobacco: Never Used  . Alcohol Use: No   OB History    Gravida Para Term Preterm AB TAB SAB Ectopic Multiple Living   2 1  1 1  1   1      Review of Systems  Constitutional: Positive for fatigue. Negative for fever and chills.  HENT: Positive for sore throat and trouble swallowing. Negative for congestion, facial swelling, rhinorrhea and voice change.   Respiratory: Negative for cough, shortness of breath and wheezing.   Cardiovascular: Negative for chest pain  and leg swelling.  Gastrointestinal: Negative for nausea, vomiting, abdominal pain and diarrhea.  Genitourinary: Negative for flank pain.  Musculoskeletal: Positive for neck pain. Negative for neck stiffness.  Skin: Negative for rash.  Neurological: Negative for dizziness, syncope, weakness and headaches.      Allergies  Amoxicillin; Latex; Percocet; Plaquenil; Bactrim; Ciprofloxacin hcl; Keflex; Penicillins; and Tetracyclines & related  Home Medications   Prior to Admission medications    Medication Sig Start Date End Date Taking? Authorizing Provider  acetaminophen (TYLENOL) 325 MG tablet Take 650 mg by mouth every 6 (six) hours as needed for mild pain or headache.   Yes Historical Provider, MD  albuterol (PROVENTIL HFA;VENTOLIN HFA) 108 (90 BASE) MCG/ACT inhaler Inhale 2 puffs into the lungs every 6 (six) hours as needed. For breathing   Yes Historical Provider, MD  albuterol (PROVENTIL) (2.5 MG/3ML) 0.083% nebulizer solution Take 2.5 mg by nebulization every 6 (six) hours as needed for wheezing or shortness of breath.   Yes Historical Provider, MD  Ascorbic Acid (VITAMIN C) 1000 MG tablet Take 1,000 mg by mouth 2 (two) times daily.   Yes Historical Provider, MD  calcium-vitamin D (OSCAL WITH D) 500-200 MG-UNIT per tablet Take 1 tablet by mouth 2 (two) times daily.   Yes Historical Provider, MD  cetirizine (ZYRTEC) 10 MG tablet Take 10 mg by mouth daily.   Yes Historical Provider, MD  cyclobenzaprine (FLEXERIL) 10 MG tablet Take 10 mg by mouth 2 (two) times daily as needed. For pain   Yes Historical Provider, MD  ferrous sulfate 325 (65 FE) MG tablet Take 325 mg by mouth daily with breakfast.   Yes Historical Provider, MD  folic acid (FOLVITE) 542 MCG tablet Take 2,800 mcg by mouth daily.   Yes Historical Provider, MD  furosemide (LASIX) 80 MG tablet Take 80 mg by mouth daily as needed for fluid.   Yes Historical Provider, MD  gabapentin (NEURONTIN) 100 MG capsule Take 100 mg by mouth 3 (three) times daily.    Yes Historical Provider, MD  glucosamine-chondroitin 500-400 MG tablet Take 1 tablet by mouth 2 (two) times daily.   Yes Historical Provider, MD  hydrOXYzine (ATARAX/VISTARIL) 10 MG tablet Take 10 mg by mouth at bedtime as needed for itching or anxiety.    Yes Historical Provider, MD  Levonorgestrel-Ethinyl Estradiol (AMETHIA) 0.15-0.03 &0.01 MG tablet Take 1 tablet by mouth daily.   Yes Historical Provider, MD  levothyroxine (SYNTHROID, LEVOTHROID) 75 MCG tablet Take 75 mcg  by mouth daily.   Yes Historical Provider, MD  LORazepam (ATIVAN) 1 MG tablet Take 1 mg by mouth at bedtime.    Yes Historical Provider, MD  Multiple Vitamin (MULTIVITAMIN WITH MINERALS) TABS Take 1 tablet by mouth daily.   Yes Historical Provider, MD  pantoprazole (PROTONIX) 40 MG tablet Take 40 mg by mouth 2 (two) times daily.    Yes Historical Provider, MD  SYMBICORT 160-4.5 MCG/ACT inhaler Inhale 2 puffs into the lungs 2 (two) times daily. 05/02/14  Yes Historical Provider, MD  tamsulosin (FLOMAX) 0.4 MG CAPS capsule Take 1 capsule (0.4 mg total) by mouth daily. 01/16/14  Yes Kristen N Ward, DO  topiramate (TOPAMAX) 100 MG tablet Take 100 mg by mouth at bedtime.    Yes Historical Provider, MD  Ustekinumab 45 MG/0.5ML SOSY Inject 45 mg into the skin See admin instructions. Pt states injections are administered 4 weeks x2, then 12 weeks. Next dose due 10-04-14   Yes Historical Provider, MD  VITAMIN E PO Take  400 mg by mouth 2 (two) times daily.    Yes Historical Provider, MD  VOLTAREN 1 % GEL Apply 4 g topically 3 (three) times daily as needed (to left hip.).  05/26/13  Yes Historical Provider, MD  HYDROcodone-acetaminophen (NORCO/VICODIN) 5-325 MG per tablet Take 1-2 tablets by mouth every 4 (four) hours as needed for severe pain. 08/26/14   Duffy Bruce, MD  Levonorgestrel-Ethinyl Estradiol (AMETHIA) 0.15-0.03 &0.01 MG tablet TAKE AS DIRECTED 07/19/14   Shelly Bombard, MD  naproxen (NAPROSYN) 375 MG tablet Take 1 tablet (375 mg total) by mouth 2 (two) times daily. 08/26/14   Duffy Bruce, MD   BP 113/68 mmHg  Pulse 89  Temp(Src) 98.4 F (36.9 C) (Oral)  Resp 17  SpO2 100%  LMP 07/15/2014 Physical Exam  Constitutional: She is oriented to person, place, and time. She appears well-developed and well-nourished. No distress.  HENT:  Head: Normocephalic and atraumatic.  Mouth/Throat: No oropharyngeal exudate.  Oropharynx widely patent. No posterior pharyngeal erythema or swelling. No tonsillar  swelling. No uvular swelling. No sublingual swelling. No masses or lesions noted on the gingiva, tongue, or posterior pharynx. Sublingual space is soft and nontender. Phonation is normal  Eyes: Conjunctivae are normal. Pupils are equal, round, and reactive to light.  Neck: Normal range of motion. Neck supple.  Mild tenderness to palpation over the right clavicle with possible mild soft tissue swelling just deep to the clavicle, although this is limited due to diffuse surgical and radiation changes. There is mild tenderness over the right lateral aspect of the hyoid bone, but with no discrete or appreciable masses. No appreciable lymphadenopathy. No overlying erythema or warmth. No fluctuance.  Cardiovascular: Normal rate, normal heart sounds and intact distal pulses.  Exam reveals no friction rub.   No murmur heard. Pulmonary/Chest: Effort normal and breath sounds normal. No respiratory distress. She has no wheezes. She has no rales.  Abdominal: Soft. Bowel sounds are normal. She exhibits no distension. There is no tenderness.  Musculoskeletal: She exhibits no edema.  Neurological: She is alert and oriented to person, place, and time.  Skin: Skin is warm. No rash noted.  Nursing note and vitals reviewed.   ED Course  Procedures (including critical care time) Labs Review Labs Reviewed  BASIC METABOLIC PANEL - Abnormal; Notable for the following:    BUN 5 (*)    GFR calc non Af Amer 81 (*)    All other components within normal limits  CBC - Abnormal; Notable for the following:    RBC 3.72 (*)    Hemoglobin 11.4 (*)    HCT 33.3 (*)    All other components within normal limits  POC URINE PREG, ED    Imaging Review Ct Soft Tissue Neck W Contrast  08/26/2014   CLINICAL DATA:  Right neck and fro pain extending into supraclavicular region. History of nasopharyngeal carcinoma.  EXAM: CT NECK AND CHEST WITH CONTRAST  TECHNIQUE: Multidetector CT imaging of the neck and chest was performed using  the standard protocol after bolus administration of intravenous contrast.  CONTRAST:  150mL OMNIPAQUE IOHEXOL 300 MG/ML  SOLN  COMPARISON:  CT of the neck on 07/03/2009  FINDINGS: CT NECK FINDINGS  No evidence of mass lesion, soft tissue thickening or lymphadenopathy. No acute inflammatory process is identified. No abnormal fluid collections or abscess. Stable fatty infiltration of both parotid glands. Paranasal sinuses and mastoid air cells are normally aerated. The thyroid gland is unremarkable. No airway obstruction or displacement. No vascular  abnormalities are identified.  CT CHEST FINDINGS  No evidence of pulmonary nodules or infiltrates. No enlarged lymph nodes are seen. No evidence of airway obstruction. No pneumothorax, pleural effusions or pericardial fluid. The heart size is normal. No pulmonary edema.  The thoracic aorta is of normal caliber. Visualized upper abdominal structures are unremarkable. There is a mild thoracic scoliosis convex right.  IMPRESSION: Unremarkable CT studies of the neck and chest. No evidence of malignancy or inflammatory process.   Electronically Signed   By: Aletta Edouard M.D.   On: 08/26/2014 17:54   Ct Chest W Contrast  08/26/2014   CLINICAL DATA:  Right neck and fro pain extending into supraclavicular region. History of nasopharyngeal carcinoma.  EXAM: CT NECK AND CHEST WITH CONTRAST  TECHNIQUE: Multidetector CT imaging of the neck and chest was performed using the standard protocol after bolus administration of intravenous contrast.  CONTRAST:  158mL OMNIPAQUE IOHEXOL 300 MG/ML  SOLN  COMPARISON:  CT of the neck on 07/03/2009  FINDINGS: CT NECK FINDINGS  No evidence of mass lesion, soft tissue thickening or lymphadenopathy. No acute inflammatory process is identified. No abnormal fluid collections or abscess. Stable fatty infiltration of both parotid glands. Paranasal sinuses and mastoid air cells are normally aerated. The thyroid gland is unremarkable. No airway  obstruction or displacement. No vascular abnormalities are identified.  CT CHEST FINDINGS  No evidence of pulmonary nodules or infiltrates. No enlarged lymph nodes are seen. No evidence of airway obstruction. No pneumothorax, pleural effusions or pericardial fluid. The heart size is normal. No pulmonary edema.  The thoracic aorta is of normal caliber. Visualized upper abdominal structures are unremarkable. There is a mild thoracic scoliosis convex right.  IMPRESSION: Unremarkable CT studies of the neck and chest. No evidence of malignancy or inflammatory process.   Electronically Signed   By: Aletta Edouard M.D.   On: 08/26/2014 17:54     EKG Interpretation None      MDM   49 year old female with remote history of nasopharyngeal cancer status post resection, currently in remission, diabetes, psoriatic and rheumatoid arthritis who presents with a one-month history of right clavicle and right sided anterior neck pain. See HPI above. On arrival, T 98.44F, HR 95, RR 18, BP 120/78, satting 100% on RA. Exam as above, pt overall well-appearing and in NAD.  Pt's presentation is most c/w likely musculoskeletal chest wall pain given reproduction on exam and movement. No appreciable LAD on my exam but given h/o nasopharyngeal CA and radiation, concern for possible local vs metastatic disease. Pt also c/o dysphagia but this is chronic, and she is tolerating solid and liquid PO without difficulty. She does endorse progressive solid dysphagia c/f stricture versus achalasia. No known esophageal dysmotility but does have h/o GERD. No fever, chills, or signs of infectious etiology. No evidence of cellulitis, lymphadenitis, or abscess. Given her history, however, will obtain screening labs, CT Neck and Chest for further evaluation. No evidence of nasal obstruction or mass on my exam and nasal airways widely patent. No epistaxis. Will f/u labs and imaging.  CBC with no leukocytosis. BMP unremarkable and UPT negative. CT  scans negative for mass lesion, soft tissue thickening, or LAD. Sx improved with analgesia and she is tolerating PO. Suspect MSK pain but will have pt f/u with her ENT physician given her history, as she will likely need a scope and possible EGD. She is in agreement. VS remain stable and she is o/w well-appearing. Will d/c home.  Clinical Impression: 1.  Neck pain on right side     Disposition: Discharge  Condition: Good  I have discussed the results, Dx and Tx plan with the pt(& family if present). He/she/they expressed understanding and agree(s) with the plan. Discharge instructions discussed at great length. Strict return precautions discussed and pt &/or family have verbalized understanding of the instructions. No further questions at time of discharge.    Discharge Medication List as of 08/26/2014  6:41 PM    START taking these medications   Details  naproxen (NAPROSYN) 375 MG tablet Take 1 tablet (375 mg total) by mouth 2 (two) times daily., Starting 08/26/2014, Until Discontinued, Print        Follow Up: Nolene Ebbs, MD [place] [address] [phone]   Follow-up with your PCP in 3-5 days  ENT Surgeon   Call to set up an appointment with your ENT/Neck surgeon to discuss your nose bleeds and concerns.  [facility] [address] [id number] [place] [phone]  As needed, If symptoms worsen   Pt seen in conjunction with Dr. Roxanne Mins.   Duffy Bruce, MD 51/88/41 [id number]  David Glick, MD 30/16/01 0932

## 2014-08-27 LAB — POC URINE PREG, ED: PREG TEST UR: NEGATIVE

## 2014-08-30 ENCOUNTER — Other Ambulatory Visit: Payer: Self-pay | Admitting: Otolaryngology

## 2014-08-30 DIAGNOSIS — R4702 Dysphasia: Secondary | ICD-10-CM

## 2014-09-03 ENCOUNTER — Ambulatory Visit
Admission: RE | Admit: 2014-09-03 | Discharge: 2014-09-03 | Disposition: A | Discharge status: Home / Self Care | Payer: Medicare Other | Source: Ambulatory Visit | Attending: Otolaryngology | Admitting: Otolaryngology

## 2014-09-03 DIAGNOSIS — R4702 Dysphasia: Secondary | ICD-10-CM

## 2014-10-09 ENCOUNTER — Telehealth: Payer: Self-pay | Admitting: Obstetrics

## 2014-10-09 NOTE — Telephone Encounter (Signed)
CLOSE ENCOUNTER °

## 2014-10-10 ENCOUNTER — Ambulatory Visit: Payer: Medicaid Other | Admitting: Certified Nurse Midwife

## 2014-10-15 ENCOUNTER — Other Ambulatory Visit: Payer: Self-pay | Admitting: Internal Medicine

## 2014-10-15 DIAGNOSIS — M810 Age-related osteoporosis without current pathological fracture: Secondary | ICD-10-CM

## 2014-10-24 ENCOUNTER — Other Ambulatory Visit: Payer: Self-pay | Admitting: Certified Nurse Midwife

## 2014-10-24 ENCOUNTER — Ambulatory Visit (INDEPENDENT_AMBULATORY_CARE_PROVIDER_SITE_OTHER): Payer: Medicaid Other | Admitting: Certified Nurse Midwife

## 2014-10-24 ENCOUNTER — Encounter: Payer: Self-pay | Admitting: Certified Nurse Midwife

## 2014-10-24 VITALS — BP 104/70 | HR 92 | Temp 98.1°F | Wt 198.6 lb

## 2014-10-24 DIAGNOSIS — B3731 Acute candidiasis of vulva and vagina: Secondary | ICD-10-CM

## 2014-10-24 DIAGNOSIS — Z01419 Encounter for gynecological examination (general) (routine) without abnormal findings: Secondary | ICD-10-CM

## 2014-10-24 DIAGNOSIS — Z0000 Encounter for general adult medical examination without abnormal findings: Secondary | ICD-10-CM

## 2014-10-24 DIAGNOSIS — B3789 Other sites of candidiasis: Secondary | ICD-10-CM

## 2014-10-24 DIAGNOSIS — B373 Candidiasis of vulva and vagina: Secondary | ICD-10-CM

## 2014-10-24 DIAGNOSIS — N939 Abnormal uterine and vaginal bleeding, unspecified: Secondary | ICD-10-CM | POA: Diagnosis not present

## 2014-10-24 MED ORDER — NYSTATIN 100000 UNIT/GM EX POWD: 1.0000 g | Freq: Two times a day (BID) | CUTANEOUS | Status: DC

## 2014-10-24 MED ORDER — LEVONORGEST-ETH ESTRAD 91-DAY 0.15-0.03 &0.01 MG PO TABS: ORAL_TABLET | ORAL | Status: DC

## 2014-10-24 MED ORDER — FLUCONAZOLE 100 MG PO TABS: 100.0000 mg | ORAL_TABLET | Freq: Once | ORAL | Status: DC

## 2014-10-24 NOTE — Progress Notes (Signed)
Patient ID: [name], female   DOB: 29-Dec-1965, 49 y.o.   MRN: [id number]  Subjective:     [name] is a 49 y.o. female here for a routine exam.  Current complaints: fibroids, pain with sexual intercourse and bleeding after sexual intercourse for over a month.  Having periods 4X a year with Amethia.  10 years out from Chemotherapy.  Has Lupus/arthritis.  Used to be on metformin, came off of it, checks blood sugars daily d/t long term prednisone.  BCA MGM.  Her son turns 54 y/o this weekend.  She has a long term boyfriend.    Personal health questionnaire:  Is patient Ashkenazi Jewish, have a family history of breast and/or ovarian cancer: yes Is there a family history of uterine cancer diagnosed at age < 59, gastrointestinal cancer, urinary tract cancer, family member who is a Field seismologist syndrome-associated carrier: no Is the patient overweight and hypertensive, family history of diabetes, personal history of gestational diabetes, preeclampsia or PCOS: yes Is patient over 73, have PCOS,  family history of premature CHD under age 54, diabetes, smoke, have hypertension or peripheral artery disease:  yes At any time, has a partner hit, kicked or otherwise hurt or frightened you?: no Over the past 2 weeks, have you felt down, depressed or hopeless?: no Over the past 2 weeks, have you felt little interest or pleasure in doing things?:no   Gynecologic History Patient's last menstrual period was 10/19/2014. Contraception: OCP (estrogen/progesterone) Last Pap: 05/28/13. Results were: normal Last mammogram: 03/27/14. Results were: incomplete  Obstetric History OB History  Gravida Para Term Preterm AB SAB TAB Ectopic Multiple Living  2 1  1 1 1    1     # Outcome Date GA Lbr Len/2nd Weight Sex Delivery Anes PTL Lv  2 Preterm  [redacted]w[redacted]d       Y  1 SAB  [redacted]w[redacted]d       N      Past Medical History  Diagnosis Date  . Lupus   . Cancer     nasopharenx  . Diabetes mellitus without complication      related to prednisone use  . Irregular menstrual cycle   . Papanicolaou smear of cervix with atypical squamous cells of undetermined significance (ASC-US)   . Excessive or frequent menstruation   . Leiomyoma of uterus, unspecified   . Trichimoniasis   . Psoriasis   . Headache(784.0)   . GERD (gastroesophageal reflux disease)   . Arthritis     ra, lupus  . Anxiety   . Hypothyroidism     Past Surgical History  Procedure Laterality Date  . Ankle arthroscopy    . Cesarean section    . Lymph node biopsy      neck  . Multiple extractions with alveoloplasty N/A 03/05/2013    Procedure: MULTIPLE EXTRACION WITH ALVEOLOPLASTY, extraction of decayed teeth numbers 3,4,5,18,19,22,23,24,25, extraction of retained root tips teeth numbers 2,20,21,26, bilateral mandibular alveoloplasty and upper right maxillary alveoloplasty;  Surgeon: Isac Caddy, DDS;  Location: [facility];  Service: Oral Surgery;  Laterality: N/A;  . Tooth extraction Bilateral 03/05/2013    Procedure: EXTRACTION MOLARS;  Surgeon: Isac Caddy, DDS;  Location: [facility];  Service: Oral Surgery;  Laterality: Bilateral;     Current outpatient prescriptions:  .  albuterol (PROVENTIL HFA;VENTOLIN HFA) 108 (90 BASE) MCG/ACT inhaler, Inhale 2 puffs into the lungs every 6 (six) hours as needed. For breathing, Disp: , Rfl:  .  albuterol (PROVENTIL) (2.5 MG/3ML)  0.083% nebulizer solution, Take 2.5 mg by nebulization every 6 (six) hours as needed for wheezing or shortness of breath., Disp: , Rfl:  .  Ascorbic Acid (VITAMIN C) 1000 MG tablet, Take 1,000 mg by mouth 2 (two) times daily., Disp: , Rfl:  .  calcium-vitamin D (OSCAL WITH D) 500-200 MG-UNIT per tablet, Take 1 tablet by mouth 2 (two) times daily., Disp: , Rfl:  .  celecoxib (CELEBREX) 100 MG capsule, Take 100 mg by mouth 2 (two) times daily., Disp: , Rfl:  .  cetirizine (ZYRTEC) 10 MG tablet, Take 10 mg by mouth daily., Disp: , Rfl:  .  cyclobenzaprine (FLEXERIL) 10 MG  tablet, Take 10 mg by mouth 2 (two) times daily as needed. For pain, Disp: , Rfl:  .  ferrous sulfate 325 (65 FE) MG tablet, Take 325 mg by mouth daily with breakfast., Disp: , Rfl:  .  folic acid (FOLVITE) 732 MCG tablet, Take 2,800 mcg by mouth daily., Disp: , Rfl:  .  furosemide (LASIX) 80 MG tablet, Take 80 mg by mouth daily as needed for fluid., Disp: , Rfl:  .  gabapentin (NEURONTIN) 100 MG capsule, Take 100 mg by mouth 3 (three) times daily. , Disp: , Rfl:  .  glucosamine-chondroitin 500-400 MG tablet, Take 1 tablet by mouth 2 (two) times daily., Disp: , Rfl:  .  hydrOXYzine (ATARAX/VISTARIL) 10 MG tablet, Take 10 mg by mouth at bedtime as needed for itching or anxiety. , Disp: , Rfl:  .  levothyroxine (SYNTHROID, LEVOTHROID) 75 MCG tablet, Take 75 mcg by mouth daily., Disp: , Rfl:  .  LORazepam (ATIVAN) 1 MG tablet, Take 1 mg by mouth at bedtime. , Disp: , Rfl:  .  Multiple Vitamin (MULTIVITAMIN WITH MINERALS) TABS, Take 1 tablet by mouth daily., Disp: , Rfl:  .  pantoprazole (PROTONIX) 40 MG tablet, Take 40 mg by mouth 2 (two) times daily. , Disp: , Rfl:  .  SYMBICORT 160-4.5 MCG/ACT inhaler, Inhale 2 puffs into the lungs 2 (two) times daily., Disp: , Rfl: 5 .  topiramate (TOPAMAX) 100 MG tablet, Take 100 mg by mouth at bedtime. , Disp: , Rfl:  .  Ustekinumab 45 MG/0.5ML SOSY, Inject 45 mg into the skin See admin instructions. Pt states injections are administered 4 weeks x2, then 12 weeks. Next dose due 10-04-14, Disp: , Rfl:  .  VITAMIN E PO, Take 400 mg by mouth 2 (two) times daily. , Disp: , Rfl:  .  VOLTAREN 1 % GEL, Apply 4 g topically 3 (three) times daily as needed (to left hip.). , Disp: , Rfl:  .  acetaminophen (TYLENOL) 325 MG tablet, Take 650 mg by mouth every 6 (six) hours as needed for mild pain or headache., Disp: , Rfl:  .  fluconazole (DIFLUCAN) 100 MG tablet, Take 1 tablet (100 mg total) by mouth once. Repeat in 48-72 hours., Disp: 3 tablet, Rfl: 2 .   HYDROcodone-acetaminophen (NORCO/VICODIN) 5-325 MG per tablet, Take 1-2 tablets by mouth every 4 (four) hours as needed for severe pain. (Patient not taking: Reported on 10/24/2014), Disp: 20 tablet, Rfl: 0 .  Levonorgestrel-Ethinyl Estradiol (AMETHIA) 0.15-0.03 &0.01 MG tablet, TAKE AS DIRECTED, Disp: 91 tablet, Rfl: 4 .  naproxen (NAPROSYN) 375 MG tablet, Take 1 tablet (375 mg total) by mouth 2 (two) times daily. (Patient not taking: Reported on 10/24/2014), Disp: 20 tablet, Rfl: 0 .  nystatin (MYCOSTATIN/NYSTOP) 100000 UNIT/GM POWD, Apply 1 g topically 2 (two) times daily., Disp: 60 g,  Rfl: 2 .  tamsulosin (FLOMAX) 0.4 MG CAPS capsule, Take 1 capsule (0.4 mg total) by mouth daily. (Patient not taking: Reported on 10/24/2014), Disp: 30 capsule, Rfl: 0 Allergies  Allergen Reactions  . Amoxicillin Itching  . Latex Itching  . Percocet [Oxycodone-Acetaminophen] Itching  . Plaquenil [Hydroxychloroquine Sulfate]     "Psoriasis."  . Bactrim [Sulfamethoxazole-Trimethoprim] Rash  . Ciprofloxacin Hcl Rash  . Keflex [Cephalexin] Rash  . Penicillins Rash  . Tetracyclines & Related Rash    History  Substance Use Topics  . Smoking status: Former Smoker -- 0.25 packs/day for 8 years    Quit date: 02/28/2004  . Smokeless tobacco: Never Used  . Alcohol Use: No    Family History  Problem Relation Age of Onset  . Lupus Brother       Review of Systems  Constitutional: negative for fatigue and weight loss Respiratory: negative for cough and wheezing Cardiovascular: negative for chest pain, fatigue and palpitations Gastrointestinal: + for abdominal pain and change in bowel habits Musculoskeletal:negative for myalgias Neurological: + for gait problems and tremors Behavioral/Psych: negative for abusive relationship, depression Endocrine: negative for temperature intolerance   Genitourinary:negative for abnormal menstrual periods, genital lesions, hot flashes and vaginal discharge.  +painful intercourse.    Integument/breast: negative for breast lump, breast tenderness, nipple discharge and skin lesion(s)    Objective:       BP 104/70 mmHg  Pulse 92  Temp(Src) 98.1 F (36.7 C)  Wt 90.084 kg (198 lb 9.6 oz)  LMP 10/19/2014 General:   alert  Skin:   no rash or abnormalities  Lungs:   clear to auscultation bilaterally  Heart:   regular rate and rhythm, S1, S2 normal, no murmur, click, rub or gallop  Breasts:   normal without suspicious masses, skin or nipple changes or axillary nodes  Abdomen:  normal findings: no organomegaly, soft, non-tender and no hernia  Pelvis:  External genitalia: normal general appearance Urinary system: urethral meatus normal and bladder without fullness, nontender Vaginal: normal without tenderness, induration or masses Cervix: normal appearance Adnexa: normal bimanual exam Uterus: anteverted and tender on left side, slightly enlarged in size   Lab Review Urine pregnancy test Labs reviewed yes Radiologic studies reviewed yes  50% of 30 min visit spent on counseling and coordination of care.   Assessment:    Healthy female exam.   H/O uterine fibroids Vulvovaginal candidiasis Candidiasis of chest wall under breast tissue   Plan:    Education reviewed: depression evaluation, low fat, low cholesterol diet, safe sex/STD prevention, self breast exams, skin cancer screening and weight bearing exercise. Contraception: OCP (estrogen/progesterone). Mammogram ordered. Follow up in: 1 year.   Meds ordered this encounter  Medications  . celecoxib (CELEBREX) 100 MG capsule    Sig: Take 100 mg by mouth 2 (two) times daily.  Marland Kitchen nystatin (MYCOSTATIN/NYSTOP) 100000 UNIT/GM POWD    Sig: Apply 1 g topically 2 (two) times daily.    Dispense:  60 g    Refill:  2  . fluconazole (DIFLUCAN) 100 MG tablet    Sig: Take 1 tablet (100 mg total) by mouth once. Repeat in 48-72 hours.    Dispense:  3 tablet    Refill:  2  . Levonorgestrel-Ethinyl Estradiol (AMETHIA)  0.15-0.03 &0.01 MG tablet    Sig: TAKE AS DIRECTED    Dispense:  91 tablet    Refill:  4   Orders Placed This Encounter  Procedures  . SureSwab, Vaginosis/Vaginitis Plus  . US Transvaginal  Non-OB    Standing Status: Future     Number of Occurrences:      Standing Expiration Date: 12/24/2015    Order Specific Question:  Reason for Exam (SYMPTOM  OR DIAGNOSIS REQUIRED)    Answer:  h/o fibroids, having bleeding    Order Specific Question:  Preferred imaging location?    Answer:  [facility]    Standing Status: Future     Number of Occurrences:      Standing Expiration Date: 12/24/2015    Order Specific Question:  Reason for Exam (SYMPTOM  OR DIAGNOSIS REQUIRED)    Answer:  Follow up from previous study 10/7/5    Order Specific Question:  Is the patient pregnant?    Answer:  No    Order Specific Question:  Preferred imaging location?    Answer:  [facility]  . HIV antibody (with reflex)  . Hepatitis B surface antigen  . RPR  . Hepatitis C antibody  . TSH  . Comprehensive metabolic panel  . CBC   Follow up as needed after ultrasound for fibroid management.

## 2014-10-25 LAB — RPR

## 2014-10-25 LAB — CBC

## 2014-10-25 LAB — HIV ANTIBODY (ROUTINE TESTING W REFLEX): HIV 1&2 Ab, 4th Generation: NONREACTIVE

## 2014-10-25 LAB — HEPATITIS B SURFACE ANTIGEN: Hepatitis B Surface Ag: NEGATIVE

## 2014-10-25 LAB — HEPATITIS C ANTIBODY: HCV Ab: NEGATIVE

## 2014-10-28 LAB — SURESWAB, VAGINOSIS/VAGINITIS PLUS
ATOPOBIUM VAGINAE: NOT DETECTED Log (cells/mL)
C. GLABRATA, DNA: DETECTED — AB
C. albicans, DNA: DETECTED — AB
C. parapsilosis, DNA: NOT DETECTED
C. trachomatis RNA, TMA: NOT DETECTED
C. tropicalis, DNA: NOT DETECTED
Gardnerella vaginalis: 4.7 Log (cells/mL)
LACTOBACILLUS SPECIES: NOT DETECTED Log (cells/mL)
MEGASPHAERA SPECIES: NOT DETECTED Log (cells/mL)
N. GONORRHOEAE RNA, TMA: NOT DETECTED
T. vaginalis RNA, QL TMA: NOT DETECTED

## 2014-10-29 ENCOUNTER — Other Ambulatory Visit: Payer: Self-pay | Admitting: Certified Nurse Midwife

## 2014-10-29 DIAGNOSIS — N939 Abnormal uterine and vaginal bleeding, unspecified: Secondary | ICD-10-CM

## 2014-10-29 LAB — PAP IG AND HPV HIGH-RISK: HPV DNA High Risk: NOT DETECTED

## 2014-10-30 ENCOUNTER — Ambulatory Visit (HOSPITAL_COMMUNITY)
Admission: RE | Admit: 2014-10-30 | Discharge: 2014-10-30 | Disposition: A | Discharge status: Home / Self Care | Payer: Medicare Other | Source: Ambulatory Visit | Attending: Certified Nurse Midwife | Admitting: Certified Nurse Midwife

## 2014-10-30 DIAGNOSIS — N939 Abnormal uterine and vaginal bleeding, unspecified: Secondary | ICD-10-CM | POA: Insufficient documentation

## 2014-10-30 DIAGNOSIS — D251 Intramural leiomyoma of uterus: Secondary | ICD-10-CM | POA: Diagnosis not present

## 2014-10-30 DIAGNOSIS — N941 Dyspareunia: Secondary | ICD-10-CM | POA: Diagnosis not present

## 2014-10-30 DIAGNOSIS — D252 Subserosal leiomyoma of uterus: Secondary | ICD-10-CM | POA: Diagnosis not present

## 2014-10-31 ENCOUNTER — Other Ambulatory Visit: Payer: Self-pay | Admitting: Internal Medicine

## 2014-10-31 DIAGNOSIS — E2839 Other primary ovarian failure: Secondary | ICD-10-CM

## 2014-10-31 LAB — COMPREHENSIVE METABOLIC PANEL

## 2014-10-31 LAB — TSH

## 2014-11-08 ENCOUNTER — Ambulatory Visit
Admission: RE | Admit: 2014-11-08 | Discharge: 2014-11-08 | Disposition: A | Discharge status: Home / Self Care | Payer: Medicare Other | Source: Ambulatory Visit | Attending: Internal Medicine | Admitting: Internal Medicine

## 2014-11-08 DIAGNOSIS — E2839 Other primary ovarian failure: Secondary | ICD-10-CM

## 2014-11-27 ENCOUNTER — Ambulatory Visit (INDEPENDENT_AMBULATORY_CARE_PROVIDER_SITE_OTHER): Payer: Medicare Other | Admitting: Obstetrics

## 2014-11-27 ENCOUNTER — Encounter: Payer: Self-pay | Admitting: Obstetrics

## 2014-11-27 VITALS — BP 105/73 | HR 99 | Temp 98.4°F | Ht 66.0 in | Wt 199.0 lb

## 2014-11-27 DIAGNOSIS — D251 Intramural leiomyoma of uterus: Secondary | ICD-10-CM | POA: Diagnosis not present

## 2014-11-27 DIAGNOSIS — N939 Abnormal uterine and vaginal bleeding, unspecified: Secondary | ICD-10-CM

## 2014-11-27 DIAGNOSIS — B373 Candidiasis of vulva and vagina: Secondary | ICD-10-CM | POA: Diagnosis not present

## 2014-11-27 DIAGNOSIS — B3731 Acute candidiasis of vulva and vagina: Secondary | ICD-10-CM

## 2014-11-27 NOTE — Progress Notes (Signed)
Patient ID: [name], female   DOB: [birth year], 49 y.o.   MRN: [id number]  Chief Complaint  Patient presents with  . Advice Only  . Fibroids    HPI [name] is a 49 y.o. female.  Presents for  Results of ultrasound.  H/O 1 episode of vaginal bleeding.  Has H/O uterine fibroids.  No complaints today. HPI  Past Medical History  Diagnosis Date  . Lupus   . Cancer     nasopharenx  . 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 Irregular menstrual cycle   . Papanicolaou smear of cervix with atypical squamous cells of undetermined significance (ASC-US)   . Excessive or frequent menstruation   . Leiomyoma of uterus, unspecified   . Trichimoniasis   . Psoriasis   . Headache(784.0)   . GERD (gastroesophageal reflux disease)   . Arthritis     ra, lupus  . Anxiety   . Hypothyroidism     Past Surgical History  Procedure Laterality Date  . Ankle arthroscopy    . Cesarean section    . Lymph node biopsy      neck  . Multiple extractions with alveoloplasty N/A 03/05/2013    Procedure: MULTIPLE EXTRACION WITH ALVEOLOPLASTY, extraction of decayed teeth numbers 3,4,5,18,19,22,23,24,25, extraction of retained root tips teeth numbers 2,20,21,26, bilateral mandibular alveoloplasty and upper right maxillary alveoloplasty;  Surgeon: Isac Caddy, DDS;  Location: [facility];  Service: Oral Surgery;  Laterality: N/A;  . Tooth extraction Bilateral 03/05/2013    Procedure: EXTRACTION MOLARS;  Surgeon: Isac Caddy, DDS;  Location: [facility];  Service: Oral Surgery;  Laterality: Bilateral;    Family History  Problem Relation Age of Onset  . Lupus Brother     Social History History  Substance Use Topics  . Smoking status: Former Smoker -- 0.25 packs/day for 8 years    Quit date: 02/28/2004  . Smokeless tobacco: Never Used  . Alcohol Use: No    Allergies  Allergen Reactions  . Amoxicillin Itching  . Latex Itching  . Percocet  [Oxycodone-Acetaminophen] Itching  . Plaquenil [Hydroxychloroquine Sulfate]     "Psoriasis."  . Bactrim [Sulfamethoxazole-Trimethoprim] Rash  . Ciprofloxacin Hcl Rash  . Keflex [Cephalexin] Rash  . Penicillins Rash  . Tetracyclines & Related Rash    Current Outpatient Prescriptions  Medication Sig Dispense Refill  . acetaminophen (TYLENOL) 325 MG tablet Take 650 mg by mouth every 6 (six) hours as needed for mild pain or headache.    . albuterol (PROVENTIL HFA;VENTOLIN HFA) 108 (90 BASE) MCG/ACT inhaler Inhale 2 puffs into the lungs every 6 (six) hours as needed. For breathing    . albuterol (PROVENTIL) (2.5 MG/3ML) 0.083% nebulizer solution Take 2.5 mg by nebulization every 6 (six) hours as needed for wheezing or shortness of breath.    . Ascorbic Acid (VITAMIN C) 1000 MG tablet Take 1,000 mg by mouth 2 (two) times daily.    . calcium-vitamin D (OSCAL WITH D) 500-200 MG-UNIT per tablet Take 1 tablet by mouth 2 (two) times daily.    . celecoxib (CELEBREX) 100 MG capsule Take 100 mg by mouth 2 (two) times daily.    . cetirizine (ZYRTEC) 10 MG tablet Take 10 mg by mouth daily.    . cyclobenzaprine (FLEXERIL) 10 MG tablet Take 10 mg by mouth 2 (two) times daily as needed. For pain    . ferrous sulfate 325 (65 FE) MG tablet Take 325 mg by mouth  daily with breakfast.    . folic acid (FOLVITE) 263 MCG tablet Take 2,800 mcg by mouth daily.    . furosemide (LASIX) 80 MG tablet Take 80 mg by mouth daily as needed for fluid.    Marland Kitchen gabapentin (NEURONTIN) 100 MG capsule Take 100 mg by mouth 3 (three) times daily.     Marland Kitchen glucosamine-chondroitin 500-400 MG tablet Take 1 tablet by mouth 2 (two) times daily.    Marland Kitchen HYDROcodone-acetaminophen (NORCO/VICODIN) 5-325 MG per tablet Take 1-2 tablets by mouth every 4 (four) hours as needed for severe pain. 20 tablet 0  . hydrOXYzine (ATARAX/VISTARIL) 10 MG tablet Take 10 mg by mouth at bedtime as needed for itching or anxiety.     . Levonorgestrel-Ethinyl Estradiol  (AMETHIA) 0.15-0.03 &0.01 MG tablet TAKE AS DIRECTED 91 tablet 4  . levothyroxine (SYNTHROID, LEVOTHROID) 75 MCG tablet Take 75 mcg by mouth daily.    Marland Kitchen LORazepam (ATIVAN) 1 MG tablet Take 1 mg by mouth at bedtime.     . methotrexate (RHEUMATREX) 2.5 MG tablet Take 20 mg by mouth once a week. Caution:Chemotherapy. Protect from light.    . Multiple Vitamin (MULTIVITAMIN WITH MINERALS) TABS Take 1 tablet by mouth daily.    . pantoprazole (PROTONIX) 40 MG tablet Take 40 mg by mouth 2 (two) times daily.     . SYMBICORT 160-4.5 MCG/ACT inhaler Inhale 2 puffs into the lungs 2 (two) times daily.  5  . topiramate (TOPAMAX) 100 MG tablet Take 100 mg by mouth at bedtime.     Marland Kitchen Ustekinumab 45 MG/0.5ML SOSY Inject 45 mg into the skin See admin instructions. Pt states injections are administered 4 weeks x2, then 12 weeks. Next dose due 10-04-14    . VITAMIN E PO Take 400 mg by mouth 2 (two) times daily.     . VOLTAREN 1 % GEL Apply 4 g topically 3 (three) times daily as needed (to left hip.).     Marland Kitchen naproxen (NAPROSYN) 375 MG tablet Take 1 tablet (375 mg total) by mouth 2 (two) times daily. (Patient not taking: Reported on 11/27/2014) 20 tablet 0  . nystatin (MYCOSTATIN/NYSTOP) 100000 UNIT/GM POWD Apply 1 g topically 2 (two) times daily. (Patient not taking: Reported on 11/27/2014) 60 g 2  . tamsulosin (FLOMAX) 0.4 MG CAPS capsule Take 1 capsule (0.4 mg total) by mouth daily. (Patient not taking: Reported on 11/27/2014) 30 capsule 0   No current facility-administered medications for this visit.    Review of Systems Review of Systems Constitutional: negative for fatigue and weight loss Respiratory: negative for cough and wheezing Cardiovascular: negative for chest pain, fatigue and palpitations Gastrointestinal: negative for abdominal pain and change in bowel habits Genitourinary: fibroids, AUB Integument/breast: negative for nipple discharge Musculoskeletal:negative for myalgias Neurological: negative for gait  problems and tremors Behavioral/Psych: negative for abusive relationship, depression Endocrine: negative for temperature intolerance     Blood pressure 105/73, pulse 99, temperature 98.4 F (36.9 C), height 5\' 6"  (1.676 m), weight 199 lb (90.266 kg).  Physical Exam Physical Exam : Deferred  100% of 10 min visit spent on counseling and coordination of care.   Data Reviewed Ultrasound  Assessment     AUB on one occasion and no further bleeding since. Uterine fibroids.  Stable.     Plan    Will F/U with endometrial biopsy. Patient will call to schedule procedure.  No orders of the defined types were placed in this encounter.   Meds ordered this encounter  Medications  . methotrexate (RHEUMATREX) 2.5 MG tablet    Sig: Take 20 mg by mouth once a week. Caution:Chemotherapy. Protect from light.

## 2014-12-17 DIAGNOSIS — L4050 Arthropathic psoriasis, unspecified: Secondary | ICD-10-CM | POA: Diagnosis not present

## 2014-12-17 DIAGNOSIS — M25561 Pain in right knee: Secondary | ICD-10-CM | POA: Diagnosis not present

## 2014-12-17 DIAGNOSIS — M79641 Pain in right hand: Secondary | ICD-10-CM | POA: Diagnosis not present

## 2014-12-17 DIAGNOSIS — M25571 Pain in right ankle and joints of right foot: Secondary | ICD-10-CM | POA: Diagnosis not present

## 2014-12-18 DIAGNOSIS — M2662 Arthralgia of temporomandibular joint: Secondary | ICD-10-CM | POA: Diagnosis not present

## 2014-12-18 DIAGNOSIS — H9201 Otalgia, right ear: Secondary | ICD-10-CM | POA: Diagnosis not present

## 2014-12-27 DIAGNOSIS — M179 Osteoarthritis of knee, unspecified: Secondary | ICD-10-CM | POA: Diagnosis not present

## 2014-12-27 DIAGNOSIS — K219 Gastro-esophageal reflux disease without esophagitis: Secondary | ICD-10-CM | POA: Diagnosis not present

## 2014-12-27 DIAGNOSIS — J449 Chronic obstructive pulmonary disease, unspecified: Secondary | ICD-10-CM | POA: Diagnosis not present

## 2014-12-27 DIAGNOSIS — E119 Type 2 diabetes mellitus without complications: Secondary | ICD-10-CM | POA: Diagnosis not present

## 2014-12-27 DIAGNOSIS — J302 Other seasonal allergic rhinitis: Secondary | ICD-10-CM | POA: Diagnosis not present

## 2014-12-30 DIAGNOSIS — M1712 Unilateral primary osteoarthritis, left knee: Secondary | ICD-10-CM | POA: Diagnosis not present

## 2015-01-01 DIAGNOSIS — Z79899 Other long term (current) drug therapy: Secondary | ICD-10-CM | POA: Diagnosis not present

## 2015-01-01 DIAGNOSIS — L4050 Arthropathic psoriasis, unspecified: Secondary | ICD-10-CM | POA: Diagnosis not present

## 2015-01-01 DIAGNOSIS — L409 Psoriasis, unspecified: Secondary | ICD-10-CM | POA: Diagnosis not present

## 2015-01-05 ENCOUNTER — Other Ambulatory Visit: Payer: Self-pay | Admitting: Obstetrics

## 2015-01-16 ENCOUNTER — Ambulatory Visit: Payer: Medicare Other | Admitting: Physical Therapy

## 2015-02-04 ENCOUNTER — Ambulatory Visit: Payer: Medicare Other | Admitting: Physical Therapy

## 2015-02-07 DIAGNOSIS — M179 Osteoarthritis of knee, unspecified: Secondary | ICD-10-CM | POA: Diagnosis not present

## 2015-02-07 DIAGNOSIS — E039 Hypothyroidism, unspecified: Secondary | ICD-10-CM | POA: Diagnosis not present

## 2015-02-07 DIAGNOSIS — M3210 Systemic lupus erythematosus, organ or system involvement unspecified: Secondary | ICD-10-CM | POA: Diagnosis not present

## 2015-02-07 DIAGNOSIS — J449 Chronic obstructive pulmonary disease, unspecified: Secondary | ICD-10-CM | POA: Diagnosis not present

## 2015-02-07 DIAGNOSIS — E119 Type 2 diabetes mellitus without complications: Secondary | ICD-10-CM | POA: Diagnosis not present

## 2015-02-14 ENCOUNTER — Ambulatory Visit: Payer: Medicare Other | Attending: Physician Assistant | Admitting: Physical Therapy

## 2015-03-06 ENCOUNTER — Other Ambulatory Visit: Payer: Self-pay

## 2015-03-06 ENCOUNTER — Other Ambulatory Visit: Payer: Self-pay | Admitting: Certified Nurse Midwife

## 2015-03-06 DIAGNOSIS — Z1231 Encounter for screening mammogram for malignant neoplasm of breast: Secondary | ICD-10-CM

## 2015-03-18 DIAGNOSIS — Z87442 Personal history of urinary calculi: Secondary | ICD-10-CM | POA: Diagnosis not present

## 2015-03-19 DIAGNOSIS — M79642 Pain in left hand: Secondary | ICD-10-CM | POA: Diagnosis not present

## 2015-03-19 DIAGNOSIS — M25561 Pain in right knee: Secondary | ICD-10-CM | POA: Diagnosis not present

## 2015-03-19 DIAGNOSIS — Z09 Encounter for follow-up examination after completed treatment for conditions other than malignant neoplasm: Secondary | ICD-10-CM | POA: Diagnosis not present

## 2015-03-19 DIAGNOSIS — M25571 Pain in right ankle and joints of right foot: Secondary | ICD-10-CM | POA: Diagnosis not present

## 2015-04-02 ENCOUNTER — Inpatient Hospital Stay: Admission: RE | Admit: 2015-04-02 | Payer: Medicare Other | Source: Ambulatory Visit

## 2015-05-09 DIAGNOSIS — J449 Chronic obstructive pulmonary disease, unspecified: Secondary | ICD-10-CM | POA: Diagnosis not present

## 2015-05-09 DIAGNOSIS — E119 Type 2 diabetes mellitus without complications: Secondary | ICD-10-CM | POA: Diagnosis not present

## 2015-05-09 DIAGNOSIS — H40013 Open angle with borderline findings, low risk, bilateral: Secondary | ICD-10-CM | POA: Diagnosis not present

## 2015-05-09 DIAGNOSIS — M542 Cervicalgia: Secondary | ICD-10-CM | POA: Diagnosis not present

## 2015-05-09 DIAGNOSIS — M3210 Systemic lupus erythematosus, organ or system involvement unspecified: Secondary | ICD-10-CM | POA: Diagnosis not present

## 2015-05-09 DIAGNOSIS — Z23 Encounter for immunization: Secondary | ICD-10-CM | POA: Diagnosis not present

## 2015-06-09 DIAGNOSIS — J449 Chronic obstructive pulmonary disease, unspecified: Secondary | ICD-10-CM | POA: Diagnosis not present

## 2015-06-09 DIAGNOSIS — M3210 Systemic lupus erythematosus, organ or system involvement unspecified: Secondary | ICD-10-CM | POA: Diagnosis not present

## 2015-06-09 DIAGNOSIS — M25579 Pain in unspecified ankle and joints of unspecified foot: Secondary | ICD-10-CM | POA: Diagnosis not present

## 2015-06-09 DIAGNOSIS — Z719 Counseling, unspecified: Secondary | ICD-10-CM | POA: Diagnosis not present

## 2015-06-09 DIAGNOSIS — E119 Type 2 diabetes mellitus without complications: Secondary | ICD-10-CM | POA: Diagnosis not present

## 2015-06-09 DIAGNOSIS — M7989 Other specified soft tissue disorders: Secondary | ICD-10-CM | POA: Diagnosis not present

## 2015-06-20 ENCOUNTER — Ambulatory Visit: Payer: Medicare Other

## 2015-07-03 ENCOUNTER — Ambulatory Visit: Payer: Medicare Other

## 2015-07-04 ENCOUNTER — Ambulatory Visit: Payer: Medicare Other

## 2015-09-23 ENCOUNTER — Ambulatory Visit: Payer: Medicare Other

## 2015-09-29 ENCOUNTER — Encounter: Payer: Self-pay | Admitting: Internal Medicine

## 2015-10-27 ENCOUNTER — Ambulatory Visit (AMBULATORY_SURGERY_CENTER): Payer: Self-pay | Admitting: *Deleted

## 2015-10-27 VITALS — Ht 64.0 in | Wt 183.4 lb

## 2015-10-27 DIAGNOSIS — Z1211 Encounter for screening for malignant neoplasm of colon: Secondary | ICD-10-CM

## 2015-10-27 MED ORDER — SUPREP BOWEL PREP KIT 17.5-3.13-1.6 GM/177ML PO SOLN: 1.0000 | Freq: Once | ORAL | Status: DC

## 2015-10-27 NOTE — Progress Notes (Signed)
Patient denies any allergies to egg or soy products. Patient denies complications with anesthesia/sedation.  Patient denies oxygen use at home and denies diet medications. Emmi instructions for colonoscopy explained but patient denied.     

## 2015-11-10 ENCOUNTER — Encounter: Payer: Self-pay | Admitting: Internal Medicine

## 2015-11-10 ENCOUNTER — Ambulatory Visit (AMBULATORY_SURGERY_CENTER): Payer: Medicare Other | Admitting: Internal Medicine

## 2015-11-10 VITALS — BP 130/74 | HR 81 | Temp 98.0°F | Resp 18 | Ht 64.0 in | Wt 183.0 lb

## 2015-11-10 DIAGNOSIS — D120 Benign neoplasm of cecum: Secondary | ICD-10-CM | POA: Diagnosis not present

## 2015-11-10 DIAGNOSIS — Z1211 Encounter for screening for malignant neoplasm of colon: Secondary | ICD-10-CM

## 2015-11-10 MED ORDER — SODIUM CHLORIDE 0.9 % IV SOLN: 500.0000 mL | INTRAVENOUS | Status: DC

## 2015-11-10 NOTE — Patient Instructions (Signed)
YOU HAD AN ENDOSCOPIC PROCEDURE TODAY AT [facility]:   Refer to the procedure report that was given to you for any specific questions about what was found during the examination.  If the procedure report does not answer your questions, please call your gastroenterologist to clarify.  If you requested that your care partner not be given the details of your procedure findings, then the procedure report has been included in a sealed envelope for you to review at your convenience later.  YOU SHOULD EXPECT: Some feelings of bloating in the abdomen. Passage of more gas than usual.  Walking can help get rid of the air that was put into your GI tract during the procedure and reduce the bloating. If you had a lower endoscopy (such as a colonoscopy or flexible sigmoidoscopy) you may notice spotting of blood in your stool or on the toilet paper. If you underwent a bowel prep for your procedure, you may not have a normal bowel movement for a few days.  Please Note:  You might notice some irritation and congestion in your nose or some drainage.  This is from the oxygen used during your procedure.  There is no need for concern and it should clear up in a day or so.  SYMPTOMS TO REPORT IMMEDIATELY:   Following lower endoscopy (colonoscopy or flexible sigmoidoscopy):  Excessive amounts of blood in the stool  Significant tenderness or worsening of abdominal pains  Swelling of the abdomen that is new, acute  Fever of 100F or higher   For urgent or emergent issues, a gastroenterologist can be reached at any hour by calling [phone].   DIET: Your first meal following the procedure should be a small meal and then it is ok to progress to your normal diet. Heavy or fried foods are harder to digest and may make you feel nauseous or bloated.  Likewise, meals heavy in dairy and vegetables can increase bloating.  Drink plenty of fluids but you should avoid alcoholic beverages for 24 hours.  Increase  the fiber in your diet, and drink plenty of water.  ACTIVITY:  You should plan to take it easy for the rest of today and you should NOT DRIVE or use heavy machinery until tomorrow (because of the sedation medicines used during the test).    FOLLOW UP: Our staff will call the number listed on your records the next business day following your procedure to check on you and address any questions or concerns that you may have regarding the information given to you following your procedure. If we do not reach you, we will leave a message.  However, if you are feeling well and you are not experiencing any problems, there is no need to return our call.  We will assume that you have returned to your regular daily activities without incident.  If any biopsies were taken you will be contacted by phone or by letter within the next 1-3 weeks.  Please call us at [phone] if you have not heard about the biopsies in 3 weeks.    SIGNATURES/CONFIDENTIALITY: You and/or your care partner have signed paperwork which will be entered into your electronic medical record.  These signatures attest to the fact that that the information above on your After Visit Summary has been reviewed and is understood.  Full responsibility of the confidentiality of this discharge information lies with you and/or your care-partner.  Read all of the handouts given to you by your recovery room  nurse.

## 2015-11-10 NOTE — Progress Notes (Signed)
Report given to PACU RN, vss 

## 2015-11-10 NOTE — Progress Notes (Signed)
Called to room to assist during endoscopic procedure.  Patient ID and intended procedure confirmed with present staff. Received instructions for my participation in the procedure from the performing physician.  

## 2015-11-10 NOTE — Op Note (Signed)
[facility] Patient Name: [name] Procedure Date: 11/10/2015 1:33 PM MRN: [id number] Endoscopist: Jerene Bears , MD Age: 50 Referring MD:  Date of Birth: [birth year] Gender: Female Procedure:                Colonoscopy Indications:              Screening for colorectal malignant neoplasm, This                            is the patient's first colonoscopy Medicines:                Monitored Anesthesia Care Procedure:                Pre-Anesthesia Assessment:                           - Prior to the procedure, a History and Physical                            was performed, and patient medications and                            allergies were reviewed. The patient's tolerance of                            previous anesthesia was also reviewed. The risks                            and benefits of the procedure and the sedation                            options and risks were discussed with the patient.                            All questions were answered, and informed consent                            was obtained. Prior Anticoagulants: The patient has                            taken no previous anticoagulant or antiplatelet                            agents. ASA Grade Assessment: II - A patient with                            mild systemic disease. After reviewing the risks                            and benefits, the patient was deemed in                            satisfactory condition to undergo the procedure.  After obtaining informed consent, the colonoscope                            was passed under direct vision. Throughout the                            procedure, the patient's blood pressure, pulse, and                            oxygen saturations were monitored continuously. The                            Model PCF-[id number]) scope was introduced                            through the anus and advanced to the the  terminal                            ileum. The colonoscopy was performed without                            difficulty. The patient tolerated the procedure                            well. The quality of the bowel preparation was                            good. The terminal ileum, ileocecal valve,                            appendiceal orifice, and rectum were photographed. Scope In: 1:47:53 PM Scope Out: 2:02:45 PM Scope Withdrawal Time: 0 hours 9 minutes 18 seconds  Total Procedure Duration: 0 hours 14 minutes 52 seconds  Findings:                 The terminal ileum appeared normal.                           A 3 mm polyp was found in the cecum. The polyp was                            sessile. The polyp was removed with a cold biopsy                            forceps. Resection and retrieval were complete.                           Multiple diverticula were found in the sigmoid                            colon.                           The exam was otherwise without abnormality on  direct and retroflexion views. Complications:            No immediate complications. Estimated Blood Loss:     Estimated blood loss: none. Impression:               - The examined portion of the ileum was normal.                           - One 3 mm polyp in the cecum, removed with a cold                            biopsy forceps. Resected and retrieved.                           - Diverticulosis in the sigmoid colon.                           - The examination was otherwise normal on direct                            and retroflexion views. Recommendation:           - Patient has a contact number available for                            emergencies. The signs and symptoms of potential                            delayed complications were discussed with the                            patient. Return to normal activities tomorrow.                            Written discharge  instructions were provided to the                            patient.                           - Resume previous diet.                           - Continue present medications.                           - Await pathology results.                           - Repeat colonoscopy is recommended. The                            colonoscopy date will be determined after pathology                            results from today's exam become available for  review. Jerene Bears, MD 11/10/2015 2:08:11 PM This report has been signed electronically.

## 2015-11-11 ENCOUNTER — Telehealth: Payer: Self-pay | Admitting: *Deleted

## 2015-11-11 NOTE — Telephone Encounter (Signed)
  Follow up Call-  Call back number 11/10/2015  Post procedure Call Back phone  # [phone]  Permission to leave phone message Yes     Patient questions:  Do you have a fever, pain , or abdominal swelling? No. Pain Score  0 *  Have you tolerated food without any problems? Yes.    Have you been able to return to your normal activities? Yes.    Do you have any questions about your discharge instructions: Diet   No. Medications  No. Follow up visit  No.  Do you have questions or concerns about your Care? No.  Actions: * If pain score is 4 or above: No action needed, pain <4.

## 2015-11-14 ENCOUNTER — Encounter: Payer: Self-pay | Admitting: Internal Medicine

## 2015-11-25 ENCOUNTER — Other Ambulatory Visit: Payer: Self-pay | Admitting: Certified Nurse Midwife

## 2015-11-27 ENCOUNTER — Ambulatory Visit: Payer: Medicare Other | Admitting: Obstetrics

## 2015-12-10 ENCOUNTER — Ambulatory Visit (INDEPENDENT_AMBULATORY_CARE_PROVIDER_SITE_OTHER): Payer: Medicare Other | Admitting: Obstetrics

## 2015-12-10 ENCOUNTER — Encounter: Payer: Self-pay | Admitting: Obstetrics

## 2015-12-10 ENCOUNTER — Other Ambulatory Visit: Payer: Self-pay | Admitting: Obstetrics

## 2015-12-10 VITALS — Temp 98.8°F | Wt 181.0 lb

## 2015-12-10 DIAGNOSIS — N939 Abnormal uterine and vaginal bleeding, unspecified: Secondary | ICD-10-CM | POA: Diagnosis not present

## 2015-12-10 DIAGNOSIS — G8918 Other acute postprocedural pain: Secondary | ICD-10-CM

## 2015-12-10 DIAGNOSIS — Z1389 Encounter for screening for other disorder: Secondary | ICD-10-CM

## 2015-12-10 DIAGNOSIS — Z3041 Encounter for surveillance of contraceptive pills: Secondary | ICD-10-CM

## 2015-12-10 DIAGNOSIS — D251 Intramural leiomyoma of uterus: Secondary | ICD-10-CM | POA: Diagnosis not present

## 2015-12-10 LAB — POCT URINALYSIS DIPSTICK
BILIRUBIN UA: NEGATIVE
GLUCOSE UA: NEGATIVE
Ketones, UA: NEGATIVE
Leukocytes, UA: NEGATIVE
Nitrite, UA: NEGATIVE
PH UA: 5
SPEC GRAV UA: 1.02
UROBILINOGEN UA: NEGATIVE

## 2015-12-10 MED ORDER — LEVONORGEST-ETH ESTRAD 91-DAY 0.15-0.03 &0.01 MG PO TABS: ORAL_TABLET | ORAL | Status: DC

## 2015-12-10 MED ORDER — HYDROCODONE-ACETAMINOPHEN 5-325 MG PO TABS: 1.0000 | ORAL_TABLET | Freq: Four times a day (QID) | ORAL | Status: DC | PRN

## 2015-12-10 NOTE — Progress Notes (Signed)
Endometrial Biopsy Procedure Note  Pre-operative Diagnosis: AUB  Post-operative Diagnosis: same  Indications: abnormal uterine bleeding  Procedure Details   Urine pregnancy test was done  and result was not done.  The risks (including infection, bleeding, pain, and uterine perforation) and benefits of the procedure were explained to the patient and Written informed consent was obtained.    The patient was placed in the dorsal lithotomy position.  Bimanual exam showed the uterus to be in the neutral position.  A Graves' speculum inserted in the vagina, and the cervix prepped with povidone iodine.  Endocervical curettage with a Kevorkian curette was not performed.   A sharp tenaculum was applied to the anterior lip of the cervix for stabilization.  A sterile uterine sound was used to sound the uterus to a depth of 7.5cm.  A Pipelle endometrial aspirator was used to sample the endometrium.  Sample was sent for pathologic examination.  Condition: Stable  Complications: None  Plan:  The patient was advised to call for any fever or for prolonged or severe pain or bleeding. She was advised to use Vicodin as needed for mild to moderate pain. She was advised to avoid vaginal intercourse for 48 hours or until the bleeding has completely stopped.  Attending Physician Documentation: I was present for or participated in the entire procedure, including opening and closing.

## 2015-12-10 NOTE — Patient Instructions (Signed)
Endometrial Biopsy Endometrial biopsy is a procedure in which a tissue sample is taken from inside the uterus. The tissue sample is then looked at under a microscope to see if the tissue is normal or abnormal. The endometrium is the lining of the uterus. This procedure helps determine where you are in your menstrual cycle and how hormone levels are affecting the lining of the uterus. This procedure may also be used to evaluate uterine bleeding or to diagnose endometrial cancer, tuberculosis, polyps, or inflammatory conditions.  LET YOUR HEALTH CARE PROVIDER KNOW ABOUT:  Any allergies you have.  All medicines you are taking, including vitamins, herbs, eye drops, creams, and over-the-counter medicines.  Previous problems you or members of your family have had with the use of anesthetics.  Any blood disorders you have.  Previous surgeries you have had.  Medical conditions you have.  Possibility of pregnancy. RISKS AND COMPLICATIONS Generally, this is a safe procedure. However, as with any procedure, complications can occur. Possible complications include:  Bleeding.  Pelvic infection.  Puncture of the uterine wall with the biopsy device (rare). BEFORE THE PROCEDURE   Keep a record of your menstrual cycles as directed by your health care provider. You may need to schedule your procedure for a specific time in your cycle.  You may want to bring a sanitary pad to wear home after the procedure.  Arrange for someone to drive you home after the procedure if you will be given a medicine to help you relax (sedative). PROCEDURE   You may be given a sedative to relax you.  You will lie on an exam table with your feet and legs supported as in a pelvic exam.  Your health care provider will insert an instrument (speculum) into your vagina to see your cervix.  Your cervix will be cleansed with an antiseptic solution. A medicine (local anesthetic) will be used to numb the cervix.  A forceps  instrument (tenaculum) will be used to hold your cervix steady for the biopsy.  A thin, rodlike instrument (uterine sound) will be inserted through your cervix to determine the length of your uterus and the location where the biopsy sample will be removed.  A thin, flexible tube (catheter) will be inserted through your cervix and into the uterus. The catheter is used to collect the biopsy sample from your endometrial tissue.  The catheter and speculum will then be removed, and the tissue sample will be sent to a lab for examination. AFTER THE PROCEDURE  You will rest in a recovery area until you are ready to go home.  You may have mild cramping and a small amount of vaginal bleeding for a few days after the procedure. This is normal.  Make sure you find out how to get your test results.   This information is not intended to replace advice given to you by your health care provider. Make sure you discuss any questions you have with your health care provider.   Document Released: 10/08/2004 Document Revised: 02/07/2013 Document Reviewed: 11/22/2012 Elsevier Interactive Patient Education 2016 Elsevier Inc.  

## 2015-12-11 ENCOUNTER — Ambulatory Visit: Payer: Medicare Other

## 2015-12-29 ENCOUNTER — Telehealth: Payer: Self-pay | Admitting: *Deleted

## 2015-12-29 NOTE — Telephone Encounter (Signed)
Patient is calling about her endometrial biopsy results. Patient notified results are benign. I will let provider know and he can make follow up recommendation.

## 2016-04-16 ENCOUNTER — Other Ambulatory Visit: Payer: Self-pay | Admitting: Interventional Radiology

## 2016-04-16 ENCOUNTER — Other Ambulatory Visit: Payer: Self-pay | Admitting: Rheumatology

## 2016-04-16 DIAGNOSIS — Z79899 Other long term (current) drug therapy: Secondary | ICD-10-CM

## 2016-04-16 DIAGNOSIS — Z9225 Personal history of immunosupression therapy: Secondary | ICD-10-CM

## 2016-04-17 LAB — CBC WITH DIFFERENTIAL/PLATELET
BASOS ABS: 0 {cells}/uL (ref 0–200)
Basophils Relative: 0 %
EOS PCT: 2 %
Eosinophils Absolute: 132 cells/uL (ref 15–500)
HCT: 36.1 % (ref 35.0–45.0)
HEMOGLOBIN: 11.7 g/dL (ref 11.7–15.5)
LYMPHS ABS: 1716 {cells}/uL (ref 850–3900)
Lymphocytes Relative: 26 %
MCH: 30.2 pg (ref 27.0–33.0)
MCHC: 32.4 g/dL (ref 32.0–36.0)
MCV: 93.3 fL (ref 80.0–100.0)
MONOS PCT: 6 %
MPV: 12.1 fL (ref 7.5–12.5)
Monocytes Absolute: 396 cells/uL (ref 200–950)
NEUTROS PCT: 66 %
Neutro Abs: 4356 cells/uL (ref 1500–7800)
Platelets: 427 10*3/uL — ABNORMAL HIGH (ref 140–400)
RBC: 3.87 MIL/uL (ref 3.80–5.10)
RDW: 13.4 % (ref 11.0–15.0)
WBC: 6.6 10*3/uL (ref 3.8–10.8)

## 2016-04-17 LAB — COMPLETE METABOLIC PANEL WITH GFR
ALT: 8 U/L (ref 6–29)
AST: 13 U/L (ref 10–35)
Albumin: 3.5 g/dL — ABNORMAL LOW (ref 3.6–5.1)
Alkaline Phosphatase: 88 U/L (ref 33–130)
BUN: 9 mg/dL (ref 7–25)
CHLORIDE: 109 mmol/L (ref 98–110)
CO2: 24 mmol/L (ref 20–31)
Calcium: 8.8 mg/dL (ref 8.6–10.4)
Creat: 0.91 mg/dL (ref 0.50–1.05)
GFR, EST AFRICAN AMERICAN: 84 mL/min (ref >=60)
GFR, EST NON AFRICAN AMERICAN: 73 mL/min (ref >=60)
GLUCOSE: 91 mg/dL (ref 65–99)
POTASSIUM: 3.7 mmol/L (ref 3.5–5.3)
SODIUM: 140 mmol/L (ref 135–146)
Total Bilirubin: 0.3 mg/dL (ref 0.2–1.2)
Total Protein: 7.1 g/dL (ref 6.1–8.1)

## 2016-04-19 ENCOUNTER — Telehealth: Payer: Self-pay | Admitting: Rheumatology

## 2016-04-19 ENCOUNTER — Telehealth: Payer: Self-pay | Admitting: Radiology

## 2016-04-19 NOTE — Telephone Encounter (Signed)
Patient returned Amy's phone call from this afternoon.

## 2016-04-19 NOTE — Telephone Encounter (Signed)
I have called patient to advise labs are normal / PLT count c/w previous. Left message

## 2016-04-20 NOTE — Telephone Encounter (Signed)
I have called patient to advise labs are normal / again

## 2016-04-21 LAB — QUANTIFERON TB GOLD ASSAY (BLOOD)
Mitogen-Nil: 0.02 IU/mL
QUANTIFERON TB AG MINUS NIL: 0.01 [IU]/mL
Quantiferon Nil Value: 0.02 IU/mL

## 2016-04-22 ENCOUNTER — Telehealth: Payer: Self-pay | Admitting: Radiology

## 2016-04-22 DIAGNOSIS — Z9225 Personal history of immunosupression therapy: Secondary | ICD-10-CM

## 2016-04-22 NOTE — Telephone Encounter (Signed)
TB indeterm. Will call pt to come in for repeat

## 2016-04-26 NOTE — Telephone Encounter (Signed)
Called patient/ order placed, she will come in today

## 2016-04-28 LAB — QUANTIFERON TB GOLD ASSAY (BLOOD)
INTERFERON GAMMA RELEASE ASSAY: NEGATIVE
Mitogen-Nil: >10 IU/mL
QUANTIFERON NIL VALUE: 0.04 [IU]/mL
QUANTIFERON TB AG MINUS NIL: 0.03 [IU]/mL

## 2016-04-29 ENCOUNTER — Other Ambulatory Visit: Payer: Self-pay | Admitting: Certified Nurse Midwife

## 2016-04-29 ENCOUNTER — Telehealth: Payer: Self-pay | Admitting: Rheumatology

## 2016-04-29 ENCOUNTER — Telehealth: Payer: Self-pay | Admitting: Radiology

## 2016-04-29 DIAGNOSIS — Z1231 Encounter for screening mammogram for malignant neoplasm of breast: Secondary | ICD-10-CM

## 2016-04-29 NOTE — Telephone Encounter (Signed)
[phone] (Fax) faxed to them, I tried to explain to patient we are in same computer system they can view in care everywhere but she did not understand, so I told her I will fax

## 2016-04-29 NOTE — Telephone Encounter (Signed)
I have called patient to advise labs are normal  

## 2016-04-29 NOTE — Telephone Encounter (Signed)
-----   Message from Bo Merino, MD sent at 04/29/2016  8:35 AM EST ----- TBneg

## 2016-04-29 NOTE — Telephone Encounter (Signed)
Patient called back stating she hung up by mistake. She wanted you to have the number to [facility] dermatology [phone]. She said you can ask for nurse Kennyth Lose.

## 2016-05-03 ENCOUNTER — Encounter: Payer: Self-pay | Admitting: *Deleted

## 2016-05-18 ENCOUNTER — Ambulatory Visit: Payer: Medicare Other

## 2016-05-21 ENCOUNTER — Ambulatory Visit (INDEPENDENT_AMBULATORY_CARE_PROVIDER_SITE_OTHER): Payer: Medicare Other | Admitting: Internal Medicine

## 2016-05-21 ENCOUNTER — Encounter (INDEPENDENT_AMBULATORY_CARE_PROVIDER_SITE_OTHER): Payer: Self-pay

## 2016-05-21 ENCOUNTER — Encounter: Payer: Self-pay | Admitting: Internal Medicine

## 2016-05-21 VITALS — BP 124/74 | HR 108 | Ht 64.0 in | Wt 179.0 lb

## 2016-05-21 DIAGNOSIS — K219 Gastro-esophageal reflux disease without esophagitis: Secondary | ICD-10-CM

## 2016-05-21 DIAGNOSIS — R1310 Dysphagia, unspecified: Secondary | ICD-10-CM | POA: Diagnosis not present

## 2016-05-21 NOTE — Progress Notes (Signed)
Patient ID: [name], female   DOB: [birth year], 50 y.o.   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 Jeanie Cooks to evaluate dysphagia. She reports she has intermittent issues with liquid and solid food dysphagia. She also has trouble swallowing larger pills. She states it feels like liquids intermittently hang up in her upper esophagus near the sternal notch. She will at times have to vomit back up food or even liquid after swallowing. She is not having heartburn though previously has had reflux which is now well controlled with pantoprazole 40 mg twice daily. She denies odynophagia. She denies frequent coughing or history of aspiration. All symptoms began sometime after she received external beam radiation to her neck for her head and neck cancer. This was around 2006. She denies abdominal pain today. No nausea or vomiting. Bowel habits are regular without blood in her stool or melena.  She is known to me from screening colonoscopy which was performed on 11/10/2015. This revealed a 3 mm cecal polyp which was removed and multiple sigmoid diverticuli. The polyp was benign and not adenomatous.  Past Medical History:  Diagnosis Date  . Allergy   . Anxiety   . Arthritis    ra, lupus - ankles, knees, hands, neck  . Blood transfusion without reported diagnosis [place] with c/s surgery  . Bronchitis    Hx  . Cancer ([facility])    nasopharenx  . COPD (chronic obstructive pulmonary disease) ([facility])   . DDD (degenerative disc disease), lumbar   . Diabetes mellitus without complication ([facility])    related to prednisone use, no meds,cbg normal  . Diverticulosis   . Excessive or frequent menstruation   . GERD (gastroesophageal reflux disease)   . Headache(784.0)   . Hypothyroidism   . Irregular  menstrual cycle   . Leiomyoma of uterus, unspecified   . Lupus   . Neuromuscular disorder ([facility])    neuropathy in legs/feet  . Papanicolaou smear of cervix with atypical squamous cells of undetermined significance (ASC-US)   . Psoriasis   . Trichimoniasis   . Wears partial dentures    full upper and lower partial    Past Surgical History:  Procedure Laterality Date  . ANKLE ARTHROSCOPY Right   . CESAREAN SECTION     x 1  . DILATION AND CURETTAGE OF UTERUS     x 1 Missed Abortion  . LYMPH NODE BIOPSY     neck  . MULTIPLE EXTRACTIONS WITH ALVEOLOPLASTY N/A 03/05/2013   Procedure: MULTIPLE EXTRACION WITH ALVEOLOPLASTY, extraction of decayed teeth numbers 3,4,5,18,19,22,23,24,25, extraction of retained root tips teeth numbers 2,20,21,26, bilateral mandibular alveoloplasty and upper right maxillary alveoloplasty;  Surgeon: Isac Caddy, DDS;  Location: [facility];  Service: Oral Surgery;  Laterality: N/A;  . TOOTH EXTRACTION Bilateral 03/05/2013   Procedure: EXTRACTION MOLARS;  Surgeon: Isac Caddy, DDS;  Location: [facility];  Service: Oral Surgery;  Laterality: Bilateral;    Outpatient Medications Prior to Visit  Medication Sig Dispense Refill  . acetaminophen (TYLENOL) 325 MG tablet Take 650 mg by mouth every 6 (six) hours as needed for mild pain or headache. Reported on 10/27/2015    . albuterol (PROVENTIL HFA;VENTOLIN HFA) 108 (90 BASE) MCG/ACT inhaler Inhale 2 puffs into the lungs every 6 (six) hours as needed. For breathing    .  albuterol (PROVENTIL) (2.5 MG/3ML) 0.083% nebulizer solution Take 2.5 mg by nebulization every 6 (six) hours as needed for wheezing or shortness of breath. Reported on 10/27/2015    . Ascorbic Acid (VITAMIN C) 1000 MG tablet Take 1,000 mg by mouth 2 (two) times daily.    . calcium-vitamin D (OSCAL WITH D) 500-200 MG-UNIT per tablet Take 1 tablet by mouth 2 (two) times daily.    . cetirizine (ZYRTEC) 10 MG tablet Take 10 mg by mouth daily.    .  cyclobenzaprine (FLEXERIL) 10 MG tablet Take 10 mg by mouth 2 (two) times daily as needed. For pain    . ferrous sulfate 325 (65 FE) MG tablet Take 325 mg by mouth daily with breakfast.    . folic acid (FOLVITE) A999333 MCG tablet Take 2,800 mcg by mouth daily.    . furosemide (LASIX) 80 MG tablet Take 80 mg by mouth daily as needed for fluid.    Marland Kitchen gabapentin (NEURONTIN) 100 MG capsule Take 100 mg by mouth 3 (three) times daily.     Marland Kitchen glucosamine-chondroitin 500-400 MG tablet Take 1 tablet by mouth 2 (two) times daily.    Marland Kitchen HYDROcodone-acetaminophen (NORCO/VICODIN) 5-325 MG tablet Take 1-2 tablets by mouth every 6 (six) hours as needed for moderate pain. 40 tablet 0  . hydrOXYzine (ATARAX/VISTARIL) 10 MG tablet Take 10 mg by mouth at bedtime as needed for itching or anxiety. Reported on 10/27/2015    . Levonorgestrel-Ethinyl Estradiol (AMETHIA) 0.15-0.03 &0.01 MG tablet TAKE AS DIRECTED 91 tablet 4  . levothyroxine (SYNTHROID, LEVOTHROID) 75 MCG tablet Take 75 mcg by mouth daily.    Marland Kitchen LORazepam (ATIVAN) 1 MG tablet Take 1 mg by mouth at bedtime.     . Multiple Vitamin (MULTIVITAMIN WITH MINERALS) TABS Take 1 tablet by mouth daily.    . pantoprazole (PROTONIX) 40 MG tablet Take 40 mg by mouth 2 (two) times daily.     . SYMBICORT 160-4.5 MCG/ACT inhaler Inhale 2 puffs into the lungs 2 (two) times daily.  5  . topiramate (TOPAMAX) 100 MG tablet Take 100 mg by mouth at bedtime.     Marland Kitchen Ustekinumab 45 MG/0.5ML SOSY Inject 45 mg into the skin See admin instructions. Pt states injections are administered 4 weeks x2, then 12 weeks. Next dose due 10-04-14    . VITAMIN E PO Take 400 mg by mouth 2 (two) times daily.      No facility-administered medications prior to visit.     Allergies  Allergen Reactions  . Amoxicillin Itching  . Latex Itching  . Percocet [Oxycodone-Acetaminophen] Itching  . Plaquenil [Hydroxychloroquine Sulfate]     "Psoriasis."  . Bactrim [Sulfamethoxazole-Trimethoprim] Rash  .  Ciprofloxacin Hcl Rash  . Keflex [Cephalexin] Rash  . Penicillins Rash  . Tetracyclines & Related Rash    Family History  Problem Relation Age of Onset  . Lupus Brother   . Colon cancer Neg Hx   . Esophageal cancer Neg Hx   . Rectal cancer Neg Hx   . Stomach cancer Neg Hx     Social History  Substance Use Topics  . Smoking status: Former Smoker    Packs/day: 0.25    Years: 8.00    Types: Cigarettes    Quit date: 02/28/2004  . Smokeless tobacco: Never Used  . Alcohol use 0.0 oz/week     Comment: ocasional wine    ROS: As per history of present illness, otherwise negative  BP 124/74   Pulse (!) 108  Ht 5\' 4"  (1.626 m)   Wt 179 lb (81.2 kg)   SpO2 99%   BMI 30.73 kg/m  Constitutional: Well-developed and well-nourished. No distress. HEENT: Normocephalic and atraumatic. Oropharynx is clear and moist. No oropharyngeal exudate. Conjunctivae are normal.  No scleral icterus. Neck: Visible changes from external beam radiation anterior neck with thickness of the skin, no palpable masses Cardiovascular: Normal rate, regular rhythm and intact distal pulses. No M/R/G Pulmonary/chest: Effort normal and breath sounds normal. No wheezing, rales or rhonchi. Abdominal: Soft, nontender, nondistended. Bowel sounds active throughout. Extremities: no clubbing, cyanosis, or edema Neurological: Alert and oriented to person place and time. Skin: Skin is warm and dry. No rashes noted. See HEENT Psychiatric: Normal mood and affect. Behavior is normal.  RELEVANT LABS AND IMAGING: CBC    Component Value Date/Time   WBC 6.6 04/16/2016 1607   RBC 3.87 04/16/2016 1607   HGB 11.7 04/16/2016 1607   HGB 10.6 (L) 03/25/2009 1245   HCT 36.1 04/16/2016 1607   HCT 30.9 (L) 03/25/2009 1245   PLT 427 (H) 04/16/2016 1607   PLT 355 03/25/2009 1245   MCV 93.3 04/16/2016 1607   MCV 93.1 03/25/2009 1245   MCH 30.2 04/16/2016 1607   MCHC 32.4 04/16/2016 1607   RDW 13.4 04/16/2016 1607   RDW 15.8 (H)  03/25/2009 1245   LYMPHSABS 1,716 04/16/2016 1607   LYMPHSABS 1.6 03/25/2009 1245   MONOABS 396 04/16/2016 1607   MONOABS 0.5 03/25/2009 1245   EOSABS 132 04/16/2016 1607   EOSABS 0.0 03/25/2009 1245   BASOSABS 0 04/16/2016 1607   BASOSABS 0.0 03/25/2009 1245    CMP     Component Value Date/Time   NA 140 04/16/2016 1607   K 3.7 04/16/2016 1607   CL 109 04/16/2016 1607   CO2 24 04/16/2016 1607   GLUCOSE 91 04/16/2016 1607   BUN 9 04/16/2016 1607   CREATININE 0.91 04/16/2016 1607   CALCIUM 8.8 04/16/2016 1607   PROT 7.1 04/16/2016 1607   ALBUMIN 3.5 (L) 04/16/2016 1607   AST 13 04/16/2016 1607   ALT 8 04/16/2016 1607   ALKPHOS 88 04/16/2016 1607   BILITOT 0.3 04/16/2016 1607   GFRNONAA 73 04/16/2016 1607   GFRAA 84 04/16/2016 1607   CLINICAL DATA:  Intermittent dysphasia especially to solid foods such as meats   EXAM: ESOPHOGRAM / BARIUM SWALLOW / BARIUM TABLET STUDY   TECHNIQUE: Combined double contrast and single contrast examination performed using effervescent crystals, thick barium liquid, and thin barium liquid. The patient was observed with fluoroscopy swallowing a 24mm barium sulphate tablet.   FLUOROSCOPY TIME:  1 minutes, 18 seconds; 51 dGy cm2   COMPARISON:  CT scan of the neck of August 26, 2014   FINDINGS: The patient ingested thick and thin barium and the gas-forming crystals without difficulty. The cervical esophagus distended well. The piriform sinuses are deep but are symmetric bilaterally. No laryngeal penetration of the barium was observed.   The thoracic esophagus distended well. There were mild tertiary contractions intermittently demonstrated. There is no fixed stricture. A small non reducible hiatal hernia was demonstrated. No reflux was observed. No mucosal changes to suggest esophagitis were demonstrated.   The barium tablet hung transiently in the proximal thoracic esophagus but passed promptly more distally with ingestion  of additional water.   IMPRESSION: 1. There is no evidence of obstruction to passage of liquid barium nor of the barium tablet. 2. 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 There is no evidence  of esophagitis. 3. There is a small non reducible hiatal hernia.     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 Jeanie Cooks to evaluate dysphagia.  1. Dysphagia -- her symptoms of dysphagia are intermittent. Prior barium esophagram performed last year showed no obstruction with some suggestion of esophageal dysmotility. The barium tablet did hang transiently in the upper esophagus which is where she reports the most symptoms. She does have a small hiatal hernia. I recommended direct visualization with endoscopy with possible dilatation. We discussed the risks, benefit and alternatives and she is agreeable to proceed. If after upper endoscopy and possible dilation she still has symptoms I would recommend esophageal manometry. We discussed this today.  2. GERD --history of. Well-controlled with pantoprazole 40 mg twice daily managed by primary care. This issue seems well controlled though reflux can lead to esophageal dysmotility in some patients.   [id number] [address], [place] [id number]

## 2016-05-21 NOTE — Patient Instructions (Signed)
You have been scheduled for an endoscopy. Please follow written instructions given to you at your visit today. If you use inhalers (even only as needed), please bring them with you on the day of your procedure. Your physician has requested that you go to www.startemmi.com and enter the access code given to you at your visit today. This web site gives a general overview about your procedure. However, you should still follow specific instructions given to you by our office regarding your preparation for the procedure.  If you are age 61 or older, your body mass index should be between 23-30. Your Body mass index is 30.73 kg/m. If this is out of the aforementioned range listed, please consider follow up with your Primary Care Provider.  If you are age 77 or younger, your body mass index should be between 19-25. Your Body mass index is 30.73 kg/m. If this is out of the aformentioned range listed, please consider follow up with your Primary Care Provider.

## 2016-05-24 ENCOUNTER — Ambulatory Visit: Payer: Medicare Other

## 2016-06-16 ENCOUNTER — Ambulatory Visit: Payer: Medicare Other

## 2016-07-05 ENCOUNTER — Inpatient Hospital Stay: Admission: RE | Admit: 2016-07-05 | Payer: Medicare Other | Source: Ambulatory Visit

## 2016-07-06 ENCOUNTER — Ambulatory Visit (AMBULATORY_SURGERY_CENTER): Payer: Medicare Other | Admitting: Internal Medicine

## 2016-07-06 ENCOUNTER — Encounter: Payer: Self-pay | Admitting: Internal Medicine

## 2016-07-06 VITALS — BP 136/77 | HR 95 | Temp 98.6°F | Resp 23 | Ht 64.0 in | Wt 179.0 lb

## 2016-07-06 DIAGNOSIS — R1310 Dysphagia, unspecified: Secondary | ICD-10-CM | POA: Diagnosis not present

## 2016-07-06 DIAGNOSIS — K219 Gastro-esophageal reflux disease without esophagitis: Secondary | ICD-10-CM

## 2016-07-06 MED ORDER — SODIUM CHLORIDE 0.9 % IV SOLN: 500.0000 mL | INTRAVENOUS | Status: DC

## 2016-07-06 NOTE — Op Note (Signed)
[place] Patient Name: [name] Procedure Date: 07/06/2016 10:43 AM MRN: [id number] Endoscopist: Jerene Bears , MD Age: 51 Referring MD:  Date of Birth: [birth year] Gender: Female Account #: [id number] Procedure:                Upper GI endoscopy Indications:              Dysphagia, Gastro-esophageal reflux disease Medicines:                Monitored Anesthesia Care Procedure:                Pre-Anesthesia Assessment:                           - Prior to the procedure, a History and Physical                            was performed, and patient medications and                            allergies were reviewed. The patient's tolerance of                            previous anesthesia was also reviewed. The risks                            and benefits of the procedure and the sedation                            options and risks were discussed with the patient.                            All questions were answered, and informed consent                            was obtained. Prior Anticoagulants: The patient has                            taken no previous anticoagulant or antiplatelet                            agents. ASA Grade Assessment: III - A patient with                            severe systemic disease. After reviewing the risks                            and benefits, the patient was deemed in                            satisfactory condition to undergo the procedure.                           After obtaining informed consent, the endoscope was  passed under direct vision. Throughout the                            procedure, the patient's blood pressure, pulse, and                            oxygen saturations were monitored continuously. The                            Model GIF-HQ190 [id number]) scope was introduced                            through the mouth, and advanced to the second part                            of  duodenum. The upper GI endoscopy was                            accomplished without difficulty. The patient                            tolerated the procedure well. Scope In: Scope Out: Findings:                 Normal mucosa was found in the entire esophagus.                           A small hiatal hernia was present.                           No endoscopic abnormality was evident in the                            esophagus to explain the patient's complaint of                            dysphagia. It was decided, however given barium                            swallow findings, to proceed with dilation of the                            entire esophagus. A guidewire was placed and the                            scope was withdrawn. Dilation was performed with a                            Savary dilator with minimal resistance at 16 mm.                           Normal mucosa was found in the entire examined  stomach.                           The examined duodenum was normal. Complications:            No immediate complications. Estimated Blood Loss:     Estimated blood loss: none. Impression:               - Normal mucosa was found in the entire esophagus.                           - Small hiatal hernia.                           - No endoscopic esophageal abnormality to explain                            patient's dysphagia. Esophagus dilated to 16 mm.                           - Normal mucosa was found in the entire stomach.                           - Normal examined duodenum. Recommendation:           - Patient has a contact number available for                            emergencies. The signs and symptoms of potential                            delayed complications were discussed with the                            patient. Return to normal activities tomorrow.                            Written discharge instructions were provided to the                             patient.                           - Resume previous diet.                           - Continue present medications.                           - Repeat upper endoscopy PRN for retreatment. Jerene Bears, MD 07/06/2016 11:07:47 AM This report has been signed electronically.

## 2016-07-06 NOTE — Progress Notes (Signed)
Called to room to assist during endoscopic procedure.  Patient ID and intended procedure confirmed with present staff. Received instructions for my participation in the procedure from the performing physician.  

## 2016-07-06 NOTE — Patient Instructions (Signed)
YOU HAD AN ENDOSCOPIC PROCEDURE TODAY AT [facility]:   Refer to the procedure report that was given to you for any specific questions about what was found during the examination.  If the procedure report does not answer your questions, please call your gastroenterologist to clarify.  If you requested that your care partner not be given the details of your procedure findings, then the procedure report has been included in a sealed envelope for you to review at your convenience later.  YOU SHOULD EXPECT: Some feelings of bloating in the abdomen. Passage of more gas than usual.  Walking can help get rid of the air that was put into your GI tract during the procedure and reduce the bloating.   Please Note:  You might notice some irritation and congestion in your nose or some drainage.  This is from the oxygen used during your procedure.  There is no need for concern and it should clear up in a day or so.  SYMPTOMS TO REPORT IMMEDIATELY:    Following upper endoscopy (EGD)  Vomiting of blood or coffee ground material  New chest pain or pain under the shoulder blades  Painful or persistently difficult swallowing  New shortness of breath  Fever of 100F or higher  Black, tarry-looking stools  For urgent or emergent issues, a gastroenterologist can be reached at any hour by calling [phone].   DIET:  Nothing by mouth until 12 noon.   After noon, you may have clear liquids until 1pm.  Then, you may have a soft diet for the rest of today..  Drink plenty of fluids but you should avoid alcoholic beverages for 24 hours.  ACTIVITY:  You should plan to take it easy for the rest of today and you should NOT DRIVE or use heavy machinery until tomorrow (because of the sedation medicines used during the test).    FOLLOW UP: Our staff will call the number listed on your records the next business day following your procedure to check on you and address any questions or concerns that you  may have regarding the information given to you following your procedure. If we do not reach you, we will leave a message.  However, if you are feeling well and you are not experiencing any problems, there is no need to return our call.  We will assume that you have returned to your regular daily activities without incident.  If any biopsies were taken you will be contacted by phone or by letter within the next 1-3 weeks.  Please call us at [phone] if you have not heard about the biopsies in 3 weeks.    SIGNATURES/CONFIDENTIALITY: You and/or your care partner have signed paperwork which will be entered into your electronic medical record.  These signatures attest to the fact that that the information above on your After Visit Summary has been reviewed and is understood.  Full responsibility of the confidentiality of this discharge information lies with you and/or your care-partner.  Follow all of the instructions given to you by your recovery room nurse.   Thank-you for choosing [facility] for your healthcare needs today.

## 2016-07-06 NOTE — Progress Notes (Signed)
Report to PACU, RN, vss, BBS= Clear.  

## 2016-07-07 ENCOUNTER — Telehealth: Payer: Self-pay | Admitting: *Deleted

## 2016-07-07 NOTE — Telephone Encounter (Signed)
  Follow up Call-  Call back number 07/06/2016 11/10/2015  Post procedure Call Back phone  # [phone]  Permission to leave phone message Yes Yes  Some recent data might be hidden     Patient questions:  Do you have a fever, pain , or abdominal swelling? No. Pain Score  0 *  Have you tolerated food without any problems? Yes.    Have you been able to return to your normal activities? Yes.    Do you have any questions about your discharge instructions: Diet   No. Medications  No. Follow up visit  No.  Do you have questions or concerns about your Care? No.  Actions: * If pain score is 4 or above: No action needed, pain <4.

## 2016-07-28 ENCOUNTER — Other Ambulatory Visit: Payer: Self-pay | Admitting: Rheumatology

## 2016-07-29 NOTE — Telephone Encounter (Signed)
Last Visit:03/09/16 Next Visit: 08/11/16  Okay to refill Clobetasol Cream?

## 2016-08-05 ENCOUNTER — Ambulatory Visit: Payer: Medicare Other

## 2016-08-06 ENCOUNTER — Ambulatory Visit: Payer: Medicare Other

## 2016-08-10 DIAGNOSIS — Z79899 Other long term (current) drug therapy: Secondary | ICD-10-CM | POA: Insufficient documentation

## 2016-08-10 DIAGNOSIS — L4050 Arthropathic psoriasis, unspecified: Secondary | ICD-10-CM | POA: Insufficient documentation

## 2016-08-10 HISTORY — DX: Other long term (current) drug therapy: Z79.899

## 2016-08-10 NOTE — Progress Notes (Signed)
Office Visit Note  Patient: [name]             Date of Birth: 06/23/1965           MRN: [id number]             PCP: Philis Fendt, MD Referring: Nolene Ebbs, MD Visit Date: 08/11/2016 Occupation: '@GUAROCC' @    Subjective:  Follow-up Psoriasis and psoriatic arthritis  History of Present Illness: [name] is a 51 y.o. female  Follow-up on psoriasis and psoriatic arthritis. Currently patient has been taking stelara.  Initially, the medication was working well for her. Now it is not working well for her. She's having ongoing joint pain stiffness and swelling. stelara is being prescribed by her dermatologist, Dr. Hinda Lenis at [facility]  Although the joint pain is not tolerable, patient does not want any prednisone taper at this time. She is hopeful that the new medication will be approved soon and she can begin the Cosentyx as soon as the second week of March. New Note that her STELARA will be due.   Activities of Daily Living:  Patient reports morning stiffness for 2 hours.   Patient Reports nocturnal pain.  Difficulty dressing/grooming: Reports Difficulty climbing stairs: Reports Difficulty getting out of chair: Reports Difficulty using hands for taps, buttons, cutlery, and/or writing: Reports   Review of Systems  Constitutional: Negative for fatigue.  HENT: Negative for mouth sores and mouth dryness.   Eyes: Negative for dryness.  Respiratory: Negative for shortness of breath.   Gastrointestinal: Negative for constipation and diarrhea.  Musculoskeletal: Negative for myalgias and myalgias.  Skin: Negative for sensitivity to sunlight.  Psychiatric/Behavioral: Negative for decreased concentration and sleep disturbance.    PMFS History:  Patient Active Problem List   Diagnosis Date Noted  . Psoriatic arthritis ([place]) 08/10/2016  . High risk medication use 08/10/2016  . Chronic pain syndrome 08/22/2013  . Pulmonary  infiltrates 06/30/2013  . Psoriasis   . GERD (gastroesophageal reflux disease)   . Hypothyroidism   . Pleurisy 06/19/2013  . Caries 03/04/2013  . Nephrolithiasis 04/05/2012  . Hydronephrosis 04/05/2012  . UTI (lower urinary tract infection) 04/05/2012  . Chronic use of steroids 04/05/2012  . Lupus (systemic lupus erythematosus) ([place]) 04/05/2012    Past Medical History:  Diagnosis Date  . Allergy   . Anxiety   . Arthritis    ra, lupus - ankles, knees, hands, neck  . Blood transfusion without reported diagnosis [place] with c/s surgery  . Bronchitis    Hx  . 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 COPD (chronic obstructive pulmonary disease) ([place])   . DDD (degenerative disc disease), lumbar   . Diabetes mellitus without complication ([place])    related to prednisone use, no meds,cbg normal  . Diverticulosis   . Excessive or frequent menstruation   . GERD (gastroesophageal reflux disease)   . Headache(784.0)   . Hypothyroidism   . Irregular menstrual cycle   . Leiomyoma of uterus, unspecified   . Lupus   . Neuromuscular disorder ([facility])    neuropathy in legs/feet  . Papanicolaou smear of cervix with atypical squamous cells of undetermined significance (ASC-US)   . Psoriasis   . Trichimoniasis   . Wears partial dentures    full upper and lower partial    Family History  Problem Relation Age of Onset  . Lupus Brother   . Colon cancer Neg Hx   . Esophageal cancer  Neg Hx   . Rectal cancer Neg Hx   . Stomach cancer Neg Hx    Past Surgical History:  Procedure Laterality Date  . ANKLE ARTHROSCOPY Right   . CESAREAN SECTION     x 1  . DILATION AND CURETTAGE OF UTERUS     x 1 Missed Abortion  . LYMPH NODE BIOPSY     neck  . MULTIPLE EXTRACTIONS WITH ALVEOLOPLASTY N/A 03/05/2013   Procedure: MULTIPLE EXTRACION WITH ALVEOLOPLASTY, extraction of decayed teeth numbers 3,4,5,18,19,22,23,24,25, extraction of retained root tips teeth numbers 2,20,21,26, bilateral mandibular  alveoloplasty and upper right maxillary alveoloplasty;  Surgeon: Isac Caddy, DDS;  Location: [facility];  Service: Oral Surgery;  Laterality: N/A;  . TOOTH EXTRACTION Bilateral 03/05/2013   Procedure: EXTRACTION MOLARS;  Surgeon: Isac Caddy, DDS;  Location: [facility];  Service: Oral Surgery;  Laterality: Bilateral;   Social History   Social History Narrative  . No narrative on file     Objective: Vital Signs: BP (!) 151/84   Pulse (!) 125   Resp 15   Ht '5\' 6"'  (1.676 m)   Wt 181 lb (82.1 kg)   BMI 29.21 kg/m    Physical Exam  Constitutional: She is oriented to person, place, and time. She appears well-developed and well-nourished.  HENT:  Head: Normocephalic and atraumatic.  Eyes: EOM are normal. Pupils are equal, round, and reactive to light.  Cardiovascular: Normal rate, regular rhythm and normal heart sounds.  Exam reveals no gallop and no friction rub.   No murmur heard. Pulmonary/Chest: Effort normal and breath sounds normal. She has no wheezes. She has no rales.  Abdominal: Soft. Bowel sounds are normal. She exhibits no distension. There is no tenderness. There is no guarding. No hernia.  Musculoskeletal: Normal range of motion. She exhibits no edema, tenderness or deformity.  Lymphadenopathy:    She has no cervical adenopathy.  Neurological: She is alert and oriented to person, place, and time. Coordination normal.  Skin: Skin is warm and dry. Capillary refill takes less than 2 seconds. No rash noted.  Psychiatric: She has a normal mood and affect. Her behavior is normal.  Nursing note and vitals reviewed.    Musculoskeletal Exam:  Full range of motion of all joints except abduction of shoulders is slightly difficult. Grip strength is equal and strong bilaterally Fiber myalgia tender points are absent  CDAI Exam: CDAI Homunculus Exam:   Tenderness:  Right hand: 2nd MCP, 3rd MCP and 4th MCP Left hand: 2nd MCP, 3rd MCP and 4th MCP  Swelling:  Right  hand: 2nd MCP, 3rd MCP and 4th MCP Left hand: 2nd MCP, 3rd MCP and 4th MCP  Joint Counts:  CDAI Tender Joint count: 6 CDAI Swollen Joint count: 6  Global Assessments:  Patient Global Assessment: 8 Provider Global Assessment: 8  CDAI Calculated Score: 28  Patient has synovitis to bilateral second third and fourth MCP joint bilaterally. Treatment failure with STELARA. Plan: Patient's dermatologist at [facility] is applying for Cosentyx.  Investigation: Findings:  March 2017:  CBC was normal.  Comprehensive metabolic panel was normal.  Her lipids show elevated triglycerides, and LDL was elevated.  TSH was 6.09  X-rays of bilateral hands, 2 views, were obtained today and compared to her previous x-rays in November 2015.  She has bilateral MCP joint and PIP joint narrowing with new erosions in her left 5th metacarpal base and increased intercarpal joint space narrowing.  There was also increased joint space narrowing  of her right 5th MCP joint compared to her previous films.  This showed radiographic progression of her arthritis.   Bilateral feet x-rays showed right ankle joint hardware, PIP and DIP narrowing but no new erosions, and no progression was noted.   Bilateral knee joint x-rays showed bilateral moderate medial compartment narrowing and some lateral compartment narrowing, bilateral patellofemoral narrowing.  04/26/2016-TB Gold Neg.  Telephone on 04/22/2016  Component Date Value Ref Range Status  . Interferon Gamma Release Assay 04/26/2016 NEGATIVE  NEGATIVE Final  . Quantiferon Nil Value 04/26/2016 0.04  IU/mL Final  . Mitogen-Nil 04/26/2016 >10.00  IU/mL Final  . Quantiferon Tb Ag Minus Nil Value 04/26/2016 0.03  IU/mL Final   Comment:   The Nil tube value is used to determine if the patient has a preexisting immune response which could cause a false-positive reading on the test. In order for a test to be valid, the Nil tube must have a value of less than or  equal to 8.0 IU/mL.   The mitogen control tube is used to assure the patient has a healthy immune status and also serves as a control for correct blood handling and incubation. It is used to detect false-negative readings. The mitogen tube must have a gamma interferon value of greater than or equal to 0.5 IU/mL higher than the value of the Nil tube.   The TB antigen tube is coated with the M. tuberculosis specific antigens. For a test to be considered positive, the TB antigen tube value minus the Nil tube value must be greater than or equal to 0.35 IU/mL.   For additional information, please refer to http://education.questdiagnostics.com/faq/QFT (This link is being provided for informational/educational purposes only.)   Lab on 04/16/2016  Component Date Value Ref Range Status  . Interferon Gamma Release Assay 04/16/2016 INDETERM* NEGATIVE Final   Comment: Results are indeterminate for response to ESAT-6,TB7.7 and/or CFP-10 test antigens. Result repeated and verified.   . Quantiferon Nil Value 04/16/2016 0.02  IU/mL Final  . Mitogen-Nil 04/16/2016 0.02  IU/mL Final  . Quantiferon Tb Ag Minus Nil Value 04/16/2016 0.01  IU/mL Final   Comment:   The Nil tube value is used to determine if the patient has a preexisting immune response which could cause a false-positive reading on the test. In order for a test to be valid, the Nil tube must have a value of less than or equal to 8.0 IU/mL.   The mitogen control tube is used to assure the patient has a healthy immune status and also serves as a control for correct blood handling and incubation. It is used to detect false-negative readings. The mitogen tube must have a gamma interferon value of greater than or equal to 0.5 IU/mL higher than the value of the Nil tube.   The TB antigen tube is coated with the M. tuberculosis specific antigens. For a test to be considered positive, the TB antigen tube value minus the Nil tube value must  be greater than or equal to 0.35 IU/mL.   For additional information, please refer to http://education.questdiagnostics.com/faq/QFT (This link is being provided for informational/educational purposes only.)   Lab on 04/16/2016  Component Date Value Ref Range Status  . WBC 04/16/2016 6.6  3.8 - 10.8 K/uL Final  . RBC 04/16/2016 3.87  3.80 - 5.10 MIL/uL Final  . Hemoglobin 04/16/2016 11.7  11.7 - 15.5 g/dL Final  . HCT 04/16/2016 36.1  35.0 - 45.0 % Final  . MCV 04/16/2016 93.3  80.0 -  100.0 fL Final  . MCH 04/16/2016 30.2  27.0 - 33.0 pg Final  . MCHC 04/16/2016 32.4  32.0 - 36.0 g/dL Final  . RDW 04/16/2016 13.4  11.0 - 15.0 % Final  . Platelets 04/16/2016 427* 140 - 400 K/uL Final  . MPV 04/16/2016 12.1  7.5 - 12.5 fL Final  . Neutro Abs 04/16/2016 4356  1,500 - 7,800 cells/uL Final  . Lymphs Abs 04/16/2016 1716  850 - 3,900 cells/uL Final  . Monocytes Absolute 04/16/2016 396  200 - 950 cells/uL Final  . Eosinophils Absolute 04/16/2016 132  15 - 500 cells/uL Final  . Basophils Absolute 04/16/2016 0  0 - 200 cells/uL Final  . Neutrophils Relative % 04/16/2016 66  % Final  . Lymphocytes Relative 04/16/2016 26  % Final  . Monocytes Relative 04/16/2016 6  % Final  . Eosinophils Relative 04/16/2016 2  % Final  . Basophils Relative 04/16/2016 0  % Final  . Smear Review 04/16/2016 Criteria for review not met   Final  . Sodium 04/16/2016 140  135 - 146 mmol/L Final  . Potassium 04/16/2016 3.7  3.5 - 5.3 mmol/L Final  . Chloride 04/16/2016 109  98 - 110 mmol/L Final  . CO2 04/16/2016 24  20 - 31 mmol/L Final  . Glucose, Bld 04/16/2016 91  65 - 99 mg/dL Final  . BUN 04/16/2016 9  7 - 25 mg/dL Final  . Creat 04/16/2016 0.91  0.50 - 1.05 mg/dL Final   Comment:   For patients > or = 52 years of age: The upper reference limit for Creatinine is approximately 13% higher for people identified as African-American.     . Total Bilirubin 04/16/2016 0.3  0.2 - 1.2 mg/dL Final  . Alkaline  Phosphatase 04/16/2016 88  33 - 130 U/L Final  . AST 04/16/2016 13  10 - 35 U/L Final  . ALT 04/16/2016 8  6 - 29 U/L Final  . Total Protein 04/16/2016 7.1  6.1 - 8.1 g/dL Final  . Albumin 04/16/2016 3.5* 3.6 - 5.1 g/dL Final  . Calcium 04/16/2016 8.8  8.6 - 10.4 mg/dL Final  . GFR, Est African American 04/16/2016 84  >=60 mL/min Final  . GFR, Est Non African American 04/16/2016 73  >=60 mL/min Final      Imaging: No results found.  Speciality Comments: No specialty comments available.   Procedures:  No procedures performed Allergies: Amoxicillin; Latex; Percocet [oxycodone-acetaminophen]; Plaquenil [hydroxychloroquine sulfate]; Bactrim [sulfamethoxazole-trimethoprim]; Ciprofloxacin hcl; Keflex [cephalexin]; Penicillins; and Tetracyclines & related   Assessment / Plan:     Visit Diagnoses: Psoriatic arthritis ([place])  Psoriasis - 08/11/2016 ==> Ongoing flare of psoriasis despite being on Stelara; plan: Dermatologist applying for Cosentyx now;  High risk medication use - Stelara q 3 mos by Dr. Hinda Lenis [facility]); (Plan: Switch to Cosentyx in March 2018 pending approval) - Plan: CMP14+EGFR, CBC with Differential/Platelet, CMP14+EGFR, CBC with Differential/Platelet  Chronic midline low back pain without sciatica  Visit for screening mammogram - Plan: MM DIGITAL SCREENING BILATERAL   Plan: #1: Psoriasis and psoriatic arthritis. Currently flaring because the Dionne Milo is not working well for the patient. Initially was working well and was prescribed by her dermatologist at [facility], Dr. Rushie Nyhan. Lately is not working for her and patient is having a fair amount of joint pain and stiffness in her hands. She has discussed an option of switching to Cosentyx. At one time before she was on Stelara, and it will was working really well  for the patient. Lately about a month before her injection is due, she is having a fair amount of joint pain.  Her next injection will be due in  about 08/26/2016. Her doctor has plans to switch her to Cosentyx at that time but if it is not approved, we'll go ahead and give her the Stelara injection and then about 3 weeks later started her on Cosentyx.  Currently patient is having a fair amount of pain in his asking for prednisone taper. I've given her prednisone 5 mg 4 pills for 5, 3 pills for 5, 2 pills for 5, 1 pill for 5, half pill per 5, stop. She knows how to take this medication. I printed and gave it to her.  She is also past due for labs. Her current insurance does not allow her to get labs to our office since we are [facility]. She will go to [facility] and get that taken care of in the next one or 2 weeks.  Her TB Gold is negative as of November 2017.  Return to clinic in 5 months    Orders: Orders Placed This Encounter  Procedures  . CMP14+EGFR  . CBC with Differential/Platelet   Meds ordered this encounter  Medications  . predniSONE (DELTASONE) 5 MG tablet    Sig: 4po qAM x 5 days,  3po qAM x 5 days, 2po qAM x 5 days, 1po qAM x 5 days, 1/2po qAM x 5 days, then stop.    Dispense:  54 tablet    Refill:  0    Order Specific Question:   Supervising Provider    Answer:   Bo Merino [phone]    Face-to-face time spent with patient was 30 minutes. 50% of time was spent in counseling and coordination of care.  Follow-Up Instructions: Return in about 4 months (around 12/09/2016) for PsA,Ps,Stellar Failure// plan for Dr. Lenna Sciara switch to Cosentyx.   Eliezer Lofts, PA-C  Note - This record has been created using [employer].  Chart creation errors have been sought, but may not always  have been located. Such creation errors do not reflect on  the standard of medical care.

## 2016-08-11 ENCOUNTER — Ambulatory Visit (INDEPENDENT_AMBULATORY_CARE_PROVIDER_SITE_OTHER): Payer: Medicare Other | Admitting: Rheumatology

## 2016-08-11 ENCOUNTER — Encounter: Payer: Self-pay | Admitting: Rheumatology

## 2016-08-11 VITALS — BP 151/84 | HR 125 | Resp 15 | Ht 66.0 in | Wt 181.0 lb

## 2016-08-11 DIAGNOSIS — L4050 Arthropathic psoriasis, unspecified: Secondary | ICD-10-CM

## 2016-08-11 DIAGNOSIS — L409 Psoriasis, unspecified: Secondary | ICD-10-CM

## 2016-08-11 DIAGNOSIS — M545 Low back pain: Secondary | ICD-10-CM

## 2016-08-11 DIAGNOSIS — Z1231 Encounter for screening mammogram for malignant neoplasm of breast: Secondary | ICD-10-CM | POA: Diagnosis not present

## 2016-08-11 DIAGNOSIS — Z79899 Other long term (current) drug therapy: Secondary | ICD-10-CM

## 2016-08-11 DIAGNOSIS — G8929 Other chronic pain: Secondary | ICD-10-CM | POA: Diagnosis not present

## 2016-08-11 MED ORDER — PREDNISONE 5 MG PO TABS: ORAL_TABLET | ORAL | 0 refills | Status: DC

## 2016-08-12 LAB — CBC WITH DIFFERENTIAL/PLATELET
BASOS: 0 %
Basophils Absolute: 0 10*3/uL (ref 0.0–0.2)
EOS (ABSOLUTE): 0.1 10*3/uL (ref 0.0–0.4)
Eos: 1 %
Hematocrit: 35.6 % (ref 34.0–46.6)
Hemoglobin: 11.2 g/dL (ref 11.1–15.9)
Immature Grans (Abs): 0 10*3/uL (ref 0.0–0.1)
Immature Granulocytes: 0 %
Lymphocytes Absolute: 2 10*3/uL (ref 0.7–3.1)
Lymphs: 23 %
MCH: 28.8 pg (ref 26.6–33.0)
MCHC: 31.5 g/dL (ref 31.5–35.7)
MCV: 92 fL (ref 79–97)
Monocytes Absolute: 0.5 10*3/uL (ref 0.1–0.9)
Monocytes: 6 %
NEUTROS ABS: 6 10*3/uL (ref 1.4–7.0)
Neutrophils: 70 %
PLATELETS: 408 10*3/uL — AB (ref 150–379)
RBC: 3.89 x10E6/uL (ref 3.77–5.28)
RDW: 13.8 % (ref 12.3–15.4)
WBC: 8.6 10*3/uL (ref 3.4–10.8)

## 2016-08-12 LAB — CMP14+EGFR
A/G RATIO: 1.2 (ref 1.2–2.2)
ALT: 10 IU/L (ref 0–32)
AST: 13 IU/L (ref 0–40)
Albumin: 4 g/dL (ref 3.5–5.5)
Alkaline Phosphatase: 97 IU/L (ref 39–117)
BUN / CREAT RATIO: 10 (ref 9–23)
BUN: 9 mg/dL (ref 6–24)
Bilirubin Total: 0.2 mg/dL (ref 0.0–1.2)
CALCIUM: 9.2 mg/dL (ref 8.7–10.2)
CO2: 21 mmol/L (ref 18–29)
Chloride: 105 mmol/L (ref 96–106)
Creatinine, Ser: 0.86 mg/dL (ref 0.57–1.00)
GFR, EST AFRICAN AMERICAN: 90 (ref >59)
GFR, EST NON AFRICAN AMERICAN: 78 (ref >59)
GLOBULIN, TOTAL: 3.4 (ref 1.5–4.5)
Glucose: 77 mg/dL (ref 65–99)
POTASSIUM: 4.2 mmol/L (ref 3.5–5.2)
Sodium: 142 mmol/L (ref 134–144)
Total Protein: 7.4 g/dL (ref 6.0–8.5)

## 2016-08-16 ENCOUNTER — Ambulatory Visit: Payer: Medicare Other | Admitting: Rheumatology

## 2016-08-20 ENCOUNTER — Ambulatory Visit: Payer: Medicare Other

## 2016-08-23 ENCOUNTER — Telehealth: Payer: Self-pay | Admitting: Radiology

## 2016-08-23 NOTE — Telephone Encounter (Signed)
Called again, advised patient of normal lab results.

## 2016-08-23 NOTE — Telephone Encounter (Signed)
I called pt to advise labs are normal

## 2016-08-23 NOTE — Telephone Encounter (Signed)
-----   Message from Eliezer Lofts, [place] sent at 08/20/2016  8:44 AM EST ----- I dictated into Dragon and it typed the wrong information. Labs are normal. Nothing is worsening on her labs. Forward labs to PCP

## 2016-09-07 ENCOUNTER — Telehealth: Payer: Self-pay | Admitting: Rheumatology

## 2016-09-07 ENCOUNTER — Ambulatory Visit: Payer: Medicare Other | Admitting: Obstetrics

## 2016-09-07 NOTE — Telephone Encounter (Signed)
Noted. Dermatologist is prescribing.

## 2016-09-07 NOTE — Telephone Encounter (Signed)
Patient states she was told to call and let the office know when she is starting cosentyx and she is starting it on Thursday. Just an fyi, per patient.

## 2016-09-15 ENCOUNTER — Other Ambulatory Visit: Payer: Self-pay | Admitting: Obstetrics

## 2016-09-28 ENCOUNTER — Other Ambulatory Visit: Payer: Self-pay | Admitting: Internal Medicine

## 2016-09-28 DIAGNOSIS — Z1231 Encounter for screening mammogram for malignant neoplasm of breast: Secondary | ICD-10-CM

## 2016-09-29 ENCOUNTER — Telehealth: Payer: Self-pay | Admitting: Rheumatology

## 2016-09-29 DIAGNOSIS — Z79899 Other long term (current) drug therapy: Secondary | ICD-10-CM

## 2016-09-29 NOTE — Telephone Encounter (Signed)
Patient called wanting to know when she needs to do her labs again or can she wait until her July appointment.  CB#[phone].  Thank you.

## 2016-09-30 ENCOUNTER — Ambulatory Visit: Payer: Medicare Other | Admitting: Obstetrics

## 2016-09-30 NOTE — Telephone Encounter (Signed)
Patient gets labs from [facility] [id number]+EGFR, CBC with Differential/Platelet

## 2016-09-30 NOTE — Telephone Encounter (Signed)
She will needed 2 months from her February labs. She can get it done either at this month  Please write order so she can get it done from [facility]

## 2016-09-30 NOTE — Telephone Encounter (Signed)
Patient advised she is due for labs every 2 months. Patient advised she is due for labs now and again in June. Patient verbalized understanding. Labs orders faxed to lab.

## 2016-10-15 ENCOUNTER — Telehealth: Payer: Self-pay | Admitting: Radiology

## 2016-10-15 LAB — CBC WITH DIFFERENTIAL/PLATELET
BASOS ABS: 0 10*3/uL (ref 0.0–0.2)
BASOS: 0 %
EOS (ABSOLUTE): 0.1 10*3/uL (ref 0.0–0.4)
Eos: 2 %
HEMATOCRIT: 38.7 % (ref 34.0–46.6)
HEMOGLOBIN: 12.4 g/dL (ref 11.1–15.9)
IMMATURE GRANS (ABS): 0 10*3/uL (ref 0.0–0.1)
Immature Granulocytes: 0 %
LYMPHS ABS: 2 10*3/uL (ref 0.7–3.1)
LYMPHS: 34 %
MCH: 29.7 pg (ref 26.6–33.0)
MCHC: 32 g/dL (ref 31.5–35.7)
MCV: 93 fL (ref 79–97)
Monocytes Absolute: 0.3 10*3/uL (ref 0.1–0.9)
Monocytes: 6 %
NEUTROS ABS: 3.4 10*3/uL (ref 1.4–7.0)
Neutrophils: 58 %
Platelets: 331 10*3/uL (ref 150–379)
RBC: 4.18 x10E6/uL (ref 3.77–5.28)
RDW: 14 % (ref 12.3–15.4)
WBC: 5.8 10*3/uL (ref 3.4–10.8)

## 2016-10-15 LAB — CMP14+EGFR
A/G RATIO: 1.2 (ref 1.2–2.2)
ALT: 7 IU/L (ref 0–32)
AST: 13 IU/L (ref 0–40)
Albumin: 3.9 g/dL (ref 3.5–5.5)
Alkaline Phosphatase: 66 IU/L (ref 39–117)
BILIRUBIN TOTAL: 0.3 mg/dL (ref 0.0–1.2)
BUN / CREAT RATIO: 11 (ref 9–23)
BUN: 9 mg/dL (ref 6–24)
CHLORIDE: 106 mmol/L (ref 96–106)
CO2: 20 mmol/L (ref 18–29)
Calcium: 9 mg/dL (ref 8.7–10.2)
Creatinine, Ser: 0.79 mg/dL (ref 0.57–1.00)
GFR calc non Af Amer: 87 mL/min/{1.73_m2} (ref >59)
GFR, EST AFRICAN AMERICAN: 100 mL/min/{1.73_m2} (ref >59)
GLOBULIN, TOTAL: 3.3 g/dL (ref 1.5–4.5)
Glucose: 102 mg/dL — ABNORMAL HIGH (ref 65–99)
POTASSIUM: 3.8 mmol/L (ref 3.5–5.2)
SODIUM: 142 mmol/L (ref 134–144)
TOTAL PROTEIN: 7.2 g/dL (ref 6.0–8.5)

## 2016-10-15 NOTE — Telephone Encounter (Signed)
WNL

## 2016-10-15 NOTE — Telephone Encounter (Signed)
I have called patient to advise labs are normal  

## 2016-10-19 ENCOUNTER — Ambulatory Visit
Admission: RE | Admit: 2016-10-19 | Discharge: 2016-10-19 | Disposition: A | Discharge status: Home / Self Care | Payer: Medicare Other | Source: Ambulatory Visit | Attending: Internal Medicine | Admitting: Internal Medicine

## 2016-10-19 DIAGNOSIS — Z1231 Encounter for screening mammogram for malignant neoplasm of breast: Secondary | ICD-10-CM

## 2016-10-20 ENCOUNTER — Ambulatory Visit: Payer: Medicare Other

## 2016-10-28 ENCOUNTER — Encounter: Payer: Self-pay | Admitting: Obstetrics

## 2016-10-28 ENCOUNTER — Ambulatory Visit (INDEPENDENT_AMBULATORY_CARE_PROVIDER_SITE_OTHER): Payer: Medicare Other | Admitting: Obstetrics

## 2016-10-28 ENCOUNTER — Other Ambulatory Visit (HOSPITAL_COMMUNITY)
Admission: RE | Admit: 2016-10-28 | Discharge: 2016-10-28 | Disposition: A | Discharge status: Home / Self Care | Payer: Medicare Other | Source: Ambulatory Visit | Attending: Obstetrics | Admitting: Obstetrics

## 2016-10-28 VITALS — BP 132/88 | HR 111 | Ht 66.0 in | Wt 183.3 lb

## 2016-10-28 DIAGNOSIS — M1990 Unspecified osteoarthritis, unspecified site: Secondary | ICD-10-CM | POA: Diagnosis not present

## 2016-10-28 DIAGNOSIS — F419 Anxiety disorder, unspecified: Secondary | ICD-10-CM | POA: Diagnosis not present

## 2016-10-28 DIAGNOSIS — M5136 Other intervertebral disc degeneration, lumbar region: Secondary | ICD-10-CM | POA: Diagnosis not present

## 2016-10-28 DIAGNOSIS — Z01419 Encounter for gynecological examination (general) (routine) without abnormal findings: Secondary | ICD-10-CM

## 2016-10-28 DIAGNOSIS — Z124 Encounter for screening for malignant neoplasm of cervix: Secondary | ICD-10-CM | POA: Diagnosis not present

## 2016-10-28 DIAGNOSIS — M329 Systemic lupus erythematosus, unspecified: Secondary | ICD-10-CM | POA: Diagnosis not present

## 2016-10-28 DIAGNOSIS — E039 Hypothyroidism, unspecified: Secondary | ICD-10-CM | POA: Insufficient documentation

## 2016-10-28 DIAGNOSIS — E119 Type 2 diabetes mellitus without complications: Secondary | ICD-10-CM | POA: Insufficient documentation

## 2016-10-28 DIAGNOSIS — N898 Other specified noninflammatory disorders of vagina: Secondary | ICD-10-CM

## 2016-10-28 DIAGNOSIS — K219 Gastro-esophageal reflux disease without esophagitis: Secondary | ICD-10-CM | POA: Diagnosis not present

## 2016-10-28 DIAGNOSIS — L409 Psoriasis, unspecified: Secondary | ICD-10-CM | POA: Insufficient documentation

## 2016-10-28 DIAGNOSIS — Z3041 Encounter for surveillance of contraceptive pills: Secondary | ICD-10-CM

## 2016-10-28 DIAGNOSIS — Z87891 Personal history of nicotine dependence: Secondary | ICD-10-CM | POA: Diagnosis not present

## 2016-10-28 DIAGNOSIS — Z1151 Encounter for screening for human papillomavirus (HPV): Secondary | ICD-10-CM | POA: Diagnosis not present

## 2016-10-28 DIAGNOSIS — J449 Chronic obstructive pulmonary disease, unspecified: Secondary | ICD-10-CM | POA: Diagnosis not present

## 2016-10-28 DIAGNOSIS — Z113 Encounter for screening for infections with a predominantly sexual mode of transmission: Secondary | ICD-10-CM | POA: Diagnosis not present

## 2016-10-28 NOTE — Progress Notes (Signed)
Subjective:        [name] is a 51 y.o. female here for a routine exam.  Current complaints: None.    Personal health questionnaire:  Is patient Ashkenazi Jewish, have a family history of breast and/or ovarian cancer: no Is there a family history of uterine cancer diagnosed at age < 26, gastrointestinal cancer, urinary tract cancer, family member who is a Field seismologist syndrome-associated carrier: no Is the patient overweight and hypertensive, family history of diabetes, personal history of gestational diabetes, preeclampsia or PCOS: no Is patient over 69, have PCOS,  family history of premature CHD under age 34, diabetes, smoke, have hypertension or peripheral artery disease:  no At any time, has a partner hit, kicked or otherwise hurt or frightened you?: no Over the past 2 weeks, have you felt down, depressed or hopeless?: no Over the past 2 weeks, have you felt little interest or pleasure in doing things?:no   Gynecologic History Patient's last menstrual period was 06/25/2016. Contraception: OCP (estrogen/progesterone) Last Pap: 2016. Results were: normal Last mammogram: 2018. Results were: normal  Obstetric History OB History  Gravida Para Term Preterm AB Living  2 1   1 1 1   SAB TAB Ectopic Multiple Live Births  1       1    # Outcome Date GA Lbr Len/2nd Weight Sex Delivery Anes PTL Lv  2 Preterm  [redacted]w[redacted]d       LIV  1 SAB  [redacted]w[redacted]d       DEC      Past Medical History:  Diagnosis Date  . Allergy   . Anxiety   . Arthritis    ra, lupus - ankles, knees, hands, neck  . Blood transfusion without reported diagnosis [place] with c/s surgery  . Bronchitis    Hx  . Cancer (HCC)    nasopharynx  . COPD (chronic obstructive pulmonary disease) ([place])   . DDD (degenerative disc disease), lumbar   . Diabetes mellitus without complication ([place])    related to prednisone use, no meds,cbg normal  . Diverticulosis   . Excessive or frequent menstruation   . GERD  (gastroesophageal reflux disease)   . Headache(784.0)   . Hypothyroidism   . Irregular menstrual cycle   . Leiomyoma of uterus, unspecified   . Lupus   . Neuromuscular disorder (HCC)    neuropathy in legs/feet  . Papanicolaou smear of cervix with atypical squamous cells of undetermined significance (ASC-US)   . Psoriasis   . Trichimoniasis   . Wears partial dentures    full upper and lower partial    Past Surgical History:  Procedure Laterality Date  . ANKLE ARTHROSCOPY Right   . CESAREAN SECTION     x 1  . DILATION AND CURETTAGE OF UTERUS     x 1 Missed Abortion  . LYMPH NODE BIOPSY     neck  . MULTIPLE EXTRACTIONS WITH ALVEOLOPLASTY N/A 03/05/2013   Procedure: MULTIPLE EXTRACION WITH ALVEOLOPLASTY, extraction of decayed teeth numbers 3,4,5,18,19,22,23,24,25, extraction of retained root tips teeth numbers 2,20,21,26, bilateral mandibular alveoloplasty and upper right maxillary alveoloplasty;  Surgeon: Isac Caddy, DDS;  Location: [facility];  Service: Oral Surgery;  Laterality: N/A;  . TOOTH EXTRACTION Bilateral 03/05/2013   Procedure: EXTRACTION MOLARS;  Surgeon: Isac Caddy, DDS;  Location: [facility];  Service: Oral Surgery;  Laterality: Bilateral;     Current Outpatient Prescriptions:  .  Ascorbic Acid (VITAMIN C) 1000 MG tablet, Take 1,000 mg  by mouth 2 (two) times daily., Disp: , Rfl:  .  calcium-vitamin D (OSCAL WITH D) 500-200 MG-UNIT per tablet, Take 1 tablet by mouth 2 (two) times daily., Disp: , Rfl:  .  cetirizine (ZYRTEC) 10 MG tablet, Take 10 mg by mouth daily., Disp: , Rfl:  .  ferrous sulfate 325 (65 FE) MG tablet, Take 325 mg by mouth daily with breakfast., Disp: , Rfl:  .  gabapentin (NEURONTIN) 100 MG capsule, Take 100 mg by mouth 3 (three) times daily. , Disp: , Rfl:  .  glucosamine-chondroitin 500-400 MG tablet, Take 1 tablet by mouth 2 (two) times daily., Disp: , Rfl:  .  Levonorgestrel-Ethinyl Estradiol (AMETHIA) 0.15-0.03 &0.01 MG tablet, TAKE AS  DIRECTED, Disp: 91 tablet, Rfl: 4 .  levothyroxine (SYNTHROID, LEVOTHROID) 75 MCG tablet, Take 75 mcg by mouth daily., Disp: , Rfl:  .  LORazepam (ATIVAN) 1 MG tablet, Take 1 mg by mouth at bedtime. , Disp: , Rfl:  .  mometasone (NASONEX) 50 MCG/ACT nasal spray, USE 2 SPRAYS IEN QD, Disp: , Rfl: 5 .  Multiple Vitamin (MULTIVITAMIN WITH MINERALS) TABS, Take 1 tablet by mouth daily., Disp: , Rfl:  .  pantoprazole (PROTONIX) 40 MG tablet, Take 40 mg by mouth 2 (two) times daily. , Disp: , Rfl:  .  SYMBICORT 160-4.5 MCG/ACT inhaler, Inhale 2 puffs into the lungs 2 (two) times daily., Disp: , Rfl: 5 .  topiramate (TOPAMAX) 100 MG tablet, Take 100 mg by mouth at bedtime. , Disp: , Rfl:  .  VITAMIN E PO, Take 400 mg by mouth 2 (two) times daily. , Disp: , Rfl:  .  acetaminophen (TYLENOL) 325 MG tablet, Take 650 mg by mouth every 6 (six) hours as needed for mild pain or headache. Reported on 10/27/2015, Disp: , Rfl:  .  albuterol (PROVENTIL HFA;VENTOLIN HFA) 108 (90 BASE) MCG/ACT inhaler, Inhale 2 puffs into the lungs every 6 (six) hours as needed. For breathing, Disp: , Rfl:  .  albuterol (PROVENTIL) (2.5 MG/3ML) 0.083% nebulizer solution, Take 2.5 mg by nebulization every 6 (six) hours as needed for wheezing or shortness of breath. Reported on 10/27/2015, Disp: , Rfl:  .  COSENTYX 300 DOSE 150 MG/ML SOSY, , Disp: , Rfl:  .  cyclobenzaprine (FLEXERIL) 10 MG tablet, Take 10 mg by mouth 2 (two) times daily as needed. For pain, Disp: , Rfl:  .  furosemide (LASIX) 40 MG tablet, , Disp: , Rfl:  .  HYDROcodone-acetaminophen (NORCO/VICODIN) 5-325 MG tablet, Take 1-2 tablets by mouth every 6 (six) hours as needed for moderate pain. (Patient not taking: Reported on 10/28/2016), Disp: 40 tablet, Rfl: 0 .  hydrOXYzine (ATARAX/VISTARIL) 10 MG tablet, Take 10 mg by mouth at bedtime as needed for itching or anxiety. Reported on 10/27/2015, Disp: , Rfl:   Current Facility-Administered Medications:  .  0.9 %  sodium chloride  infusion, 500 mL, Intravenous, Continuous, Pyrtle, Lajuan Lines, MD Allergies  Allergen Reactions  . Amoxicillin Itching  . Latex Itching  . Percocet [Oxycodone-Acetaminophen] Itching  . Plaquenil [Hydroxychloroquine Sulfate]     "Psoriasis."  . Bactrim [Sulfamethoxazole-Trimethoprim] Rash  . Ciprofloxacin Hcl Rash  . Keflex [Cephalexin] Rash  . Penicillins Rash  . Tetracyclines & Related Rash    Social History  Substance Use Topics  . Smoking status: Former Smoker    Packs/day: 0.25    Years: 8.00    Types: Cigarettes    Quit date: 02/28/2004  . Smokeless tobacco: Never Used  . Alcohol  use 0.0 oz/week     Comment: ocasional wine    Family History  Problem Relation Age of Onset  . Lupus Brother   . Breast cancer Maternal Grandmother 35  . Hypertension Mother   . Diabetes Mother   . Hypertension Father   . Colon cancer Neg Hx   . Esophageal cancer Neg Hx   . Rectal cancer Neg Hx   . Stomach cancer Neg Hx       Review of Systems  Constitutional: negative for fatigue and weight loss Respiratory: negative for cough and wheezing Cardiovascular: negative for chest pain, fatigue and palpitations Gastrointestinal: negative for abdominal pain and change in bowel habits Musculoskeletal:negative for myalgias Neurological: negative for gait problems and tremors Behavioral/Psych: negative for abusive relationship, depression Endocrine: negative for temperature intolerance    Genitourinary:negative for abnormal menstrual periods, genital lesions, hot flashes, sexual problems and vaginal discharge Integument/breast: negative for breast lump, breast tenderness, nipple discharge and skin lesion(s)    Objective:       BP 132/88   Pulse (!) 111   Ht 5\' 6"  (1.676 m)   Wt 183 lb 4.8 oz (83.1 kg)   LMP 06/25/2016   BMI 29.59 kg/m  General:   alert  Skin:   no rash or abnormalities  Lungs:   clear to auscultation bilaterally  Heart:   regular rate and rhythm, S1, S2 normal, no  murmur, click, rub or gallop  Breasts:   normal without suspicious masses, skin or nipple changes or axillary nodes  Abdomen:  normal findings: no organomegaly, soft, non-tender and no hernia  Pelvis:  External genitalia: normal general appearance Urinary system: urethral meatus normal and bladder without fullness, nontender Vaginal: normal without tenderness, induration or masses Cervix: normal appearance Adnexa: normal bimanual exam Uterus: anteverted and non-tender, normal size   Lab Review Urine pregnancy test Labs reviewed yes Radiologic studies reviewed yes  50% of 20 min visit spent on counseling and coordination of care.    Assessment:    Healthy female exam.    Plan:    Education reviewed: calcium supplements, depression evaluation, low fat, low cholesterol diet, safe sex/STD prevention, self breast exams and weight bearing exercise. Contraception: OCP (estrogen/progesterone). Follow up in: 1 year.   Meds ordered this encounter  Medications  . COSENTYX 300 DOSE 150 MG/ML SOSY   No orders of the defined types were placed in this encounter.     Patient ID: NAJMA BOZARTH, female   DOB: 05-Feb-1966, 51 y.o.   MRN: [id number]

## 2016-10-28 NOTE — Progress Notes (Signed)
Last Pap 10/2014 Normal Last MM 10/2016 Normal Last Colonoscopy 10/2015 Benign Polyp

## 2016-10-29 LAB — CERVICOVAGINAL ANCILLARY ONLY
Bacterial vaginitis: NEGATIVE
Candida vaginitis: POSITIVE — AB
Chlamydia: NEGATIVE
NEISSERIA GONORRHEA: NEGATIVE
TRICH (WINDOWPATH): NEGATIVE

## 2016-11-01 ENCOUNTER — Other Ambulatory Visit: Payer: Self-pay | Admitting: Obstetrics

## 2016-11-01 DIAGNOSIS — N760 Acute vaginitis: Principal | ICD-10-CM

## 2016-11-01 DIAGNOSIS — B9689 Other specified bacterial agents as the cause of diseases classified elsewhere: Secondary | ICD-10-CM

## 2016-11-01 MED ORDER — FLUCONAZOLE 150 MG PO TABS: 150.0000 mg | ORAL_TABLET | Freq: Once | ORAL | 0 refills | Status: AC

## 2016-11-03 LAB — CYTOLOGY - PAP
Diagnosis: NEGATIVE
HPV: NOT DETECTED

## 2017-01-07 NOTE — Progress Notes (Signed)
Office Visit Note  Patient: [name]             Date of Birth: 08-09-1965           MRN: [id number]             PCP: Nolene Ebbs, MD Referring: Nolene Ebbs, MD Visit Date: 01/12/2017 Occupation: @GUAROCC @    Subjective:  [place]   History of Present Illness: CAROLINE LONGIE is a 51 y.o. female with history of psoriatic arthritis and psoriasis. She's been on Cosyntex since March 2018. She states she started noticing improvement in her symptoms even after the first injection of Cosyntex. She's not having any joint pain or joint swelling. She has intermittent stiffness and pain with the weather change. Her psoriasis is improved a lot. She has modified her diet and that is also helped her. She had to take a prednisone taper in December 2017 which cause some weight gain. She is again trying to lose weight.   Activities of Daily Living:  Patient reports morning stiffness for 0 minute.   Patient Denies nocturnal pain.  Difficulty dressing/grooming: Denies Difficulty climbing stairs: Denies Difficulty getting out of chair: Denies Difficulty using hands for taps, buttons, cutlery, and/or writing: Denies   Review of Systems  Constitutional: Negative for fatigue, night sweats, weight gain, weight loss and weakness.  HENT: Negative for mouth sores, trouble swallowing, trouble swallowing, mouth dryness and nose dryness.   Eyes: Negative for pain, redness, visual disturbance and dryness.  Respiratory: Negative for cough, shortness of breath and difficulty breathing.   Cardiovascular: Negative for chest pain, palpitations, hypertension, irregular heartbeat and swelling in legs/feet.  Gastrointestinal: Negative for blood in stool, constipation and diarrhea.  Endocrine: Negative for increased urination.  Genitourinary: Negative for vaginal dryness.  Musculoskeletal: Positive for morning stiffness. Negative for arthralgias, joint pain, joint swelling, myalgias, muscle weakness,  muscle tenderness and myalgias.  Skin: Positive for rash. Negative for color change, hair loss, skin tightness, ulcers and sensitivity to sunlight.       Psoriasis  Allergic/Immunologic: Negative for susceptible to infections.  Neurological: Negative for dizziness, memory loss and night sweats.  Hematological: Negative for swollen glands.  Psychiatric/Behavioral: Negative for depressed mood and sleep disturbance. The patient is not nervous/anxious.     PMFS History:  Patient Active Problem List   Diagnosis Date Noted  . 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 History of asthma 01/10/2017  . Psoriatic arthritis ([place]) 08/10/2016  . High risk medication use 08/10/2016  . Chronic pain syndrome 08/22/2013  . Pulmonary infiltrates 06/30/2013  . Psoriasis   . GERD (gastroesophageal reflux disease)   . Hypothyroidism   . Pleurisy 06/19/2013  . Caries 03/04/2013  . Nephrolithiasis 04/05/2012  . Hydronephrosis 04/05/2012  . UTI (lower urinary tract infection) 04/05/2012  . On prednisone therapy 04/05/2012  . Positive ANA (antinuclear antibody) 04/05/2012    Past Medical History:  Diagnosis Date  . Allergy   . Anxiety   . Arthritis    ra, lupus - ankles, knees, hands, neck  . Blood transfusion without reported diagnosis [place] with c/s surgery  . Bronchitis    Hx  . Cancer (HCC)    nasopharynx  . COPD (chronic obstructive pulmonary disease) ([place])   . DDD (degenerative disc disease), lumbar   . Diabetes mellitus without complication ([place])    related to prednisone use, no meds,cbg normal  . Diverticulosis   . Excessive or frequent menstruation   .  GERD (gastroesophageal reflux disease)   . Headache(784.0)   . Hypothyroidism   . Irregular menstrual cycle   . Leiomyoma of uterus, unspecified   . Lupus   . Neuromuscular disorder (HCC)    neuropathy in legs/feet  . Neuropathy   . Papanicolaou smear of cervix with atypical squamous cells of undetermined significance  (ASC-US)   . Psoriasis   . Sinusitis   . Trichimoniasis   . Wears partial dentures    full upper and lower partial    Family History  Problem Relation Age of Onset  . Lupus Brother   . Breast cancer Maternal Grandmother 71  . Hypertension Mother   . Diabetes Mother   . Hypertension Father   . Colon cancer Neg Hx   . Esophageal cancer Neg Hx   . Rectal cancer Neg Hx   . Stomach cancer Neg Hx    Past Surgical History:  Procedure Laterality Date  . ANKLE ARTHROSCOPY Right   . CESAREAN SECTION     x 1  . DILATION AND CURETTAGE OF UTERUS     x 1 Missed Abortion  . LYMPH NODE BIOPSY     neck  . MULTIPLE EXTRACTIONS WITH ALVEOLOPLASTY N/A 03/05/2013   Procedure: MULTIPLE EXTRACION WITH ALVEOLOPLASTY, extraction of decayed teeth numbers 3,4,5,18,19,22,23,24,25, extraction of retained root tips teeth numbers 2,20,21,26, bilateral mandibular alveoloplasty and upper right maxillary alveoloplasty;  Surgeon: Isac Caddy, DDS;  Location: [facility];  Service: Oral Surgery;  Laterality: N/A;  . scalp biopsy    . TOOTH EXTRACTION Bilateral 03/05/2013   Procedure: EXTRACTION MOLARS;  Surgeon: Isac Caddy, DDS;  Location: [facility];  Service: Oral Surgery;  Laterality: Bilateral;   Social History   Social History Narrative  . No narrative on file     Objective: Vital Signs: BP 123/81 (BP Location: Left Arm, Patient Position: Sitting, Cuff Size: Normal)   Pulse (!) 104   Ht 5\' 6"  (1.676 m)   Wt 188 lb (85.3 kg)   BMI 30.34 kg/m    Physical Exam  Constitutional: She is oriented to person, place, and time. She appears well-developed and well-nourished.  HENT:  Head: Normocephalic and atraumatic.  Eyes: Conjunctivae and EOM are normal.  Neck: Normal range of motion.  Cardiovascular: Normal rate, regular rhythm, normal heart sounds and intact distal pulses.   Pulmonary/Chest: Effort normal and breath sounds normal.  Abdominal: Soft. Bowel sounds are normal.  Lymphadenopathy:     She has no cervical adenopathy.  Neurological: She is alert and oriented to person, place, and time.  Skin: Skin is warm and dry. Capillary refill takes less than 2 seconds. Rash noted.  Psoriasis patches on back  Psychiatric: She has a normal mood and affect. Her behavior is normal.  Nursing note and vitals reviewed.    Musculoskeletal Exam: C-spine and thoracic lumbar spine good range of motion. She had no SI joint tenderness. Shoulder joints although joints wrist joint MCPs PIPs DIPs with good range of motion. She does have some joint deformities. Hip joints knee joints ankles MTPs PIPs DIPs with good range of motion with no active synovitis.  CDAI Exam: CDAI Homunculus Exam:   Joint Counts:  CDAI Tender Joint count: 0 CDAI Swollen Joint count: 0  Global Assessments:  Patient Global Assessment: 3 Provider Global Assessment: 3  CDAI Calculated Score: 6    Investigation: Findings:  04/26/2016-TB Gold Neg.   CBC Latest Ref Rng & Units 10/14/2016 08/11/2016 04/16/2016  WBC 3.4 - 10.8  x10E3/uL 5.8 8.6 6.6  Hemoglobin 11.1 - 15.9 g/dL 12.4 11.2 11.7  Hematocrit 34.0 - 46.6 % 38.7 35.6 36.1  Platelets 150 - 379 x10E3/uL 331 408(H) 427(H)   CMP Latest Ref Rng & Units 10/14/2016 08/11/2016 04/16/2016  Glucose 65 - 99 mg/dL 102(H) 77 91  BUN 6 - 24 mg/dL 9 9 9   Creatinine 0.57 - 1.00 mg/dL 0.79 0.86 0.91  Sodium 134 - 144 mmol/L 142 142 140  Potassium 3.5 - 5.2 mmol/L 3.8 4.2 3.7  Chloride 96 - 106 mmol/L 106 105 109  CO2 18 - 29 mmol/L 20 21 24   Calcium 8.7 - 10.2 mg/dL 9.0 9.2 8.8  Total Protein 6.0 - 8.5 g/dL 7.2 7.4 7.1  Total Bilirubin 0.0 - 1.2 mg/dL 0.3 0.2 0.3  Alkaline Phos 39 - 117 IU/L 66 97 88  AST 0 - 40 IU/L 13 13 13   ALT 0 - 32 IU/L 7 10 8     Imaging: No results found.  Speciality Comments: No specialty comments available.    Procedures:  No procedures performed Allergies: Amoxicillin; Latex; Percocet [oxycodone-acetaminophen]; Plaquenil  [hydroxychloroquine sulfate]; Bactrim [sulfamethoxazole-trimethoprim]; Ciprofloxacin hcl; Keflex [cephalexin]; Penicillins; and Tetracyclines & related   Assessment / Plan:     Visit Diagnoses: Psoriatic arthritis ([place]) - Patient has history of synovitis in the past. She is doing really well on Cosyntex with no synovitis on examination today. She denies any joint swelling. She has minimal stiffness in the morning.  Psoriasis: Her lesions on her back are mostly dried up and clearing.  Positive ANA (antinuclear antibody) - a titer of 1:40 and nucleolar pattern negative ENA  negative CCP  High risk medication use Dr. Sharol Roussel prescribes - Cosentyx 300 mg sq q month(March 2018) - Plan: CBC with Differential/Platelet, COMPLETE METABOLIC PANEL WITH GFR we'll check labs today and every 3 months to monitor for drug toxicity. Her TB gold B2 in October as well.  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 CBC with Differential/Platelet  . COMPLETE METABOLIC PANEL WITH GFR  . CBC with Differential/Platelet  . COMPLETE METABOLIC PANEL WITH GFR   No orders of the defined types were placed in this encounter.    Follow-Up Instructions: Return in about 5 months (around 06/14/2017) for Psoriatic arthritis Psoriasis.   Bo Merino, MD  Note - This record has been created using Editor, commissioning.  Chart creation errors have been sought, but may not always  have been located. Such creation errors do not reflect on  the standard of medical care.

## 2017-01-10 ENCOUNTER — Ambulatory Visit: Payer: Medicare Other | Admitting: Rheumatology

## 2017-01-10 DIAGNOSIS — C119 Malignant neoplasm of nasopharynx, unspecified: Secondary | ICD-10-CM | POA: Insufficient documentation

## 2017-01-10 DIAGNOSIS — Z8709 Personal history of other diseases of the respiratory system: Secondary | ICD-10-CM | POA: Insufficient documentation

## 2017-01-10 HISTORY — DX: Malignant neoplasm of nasopharynx, unspecified: C11.9

## 2017-01-10 HISTORY — DX: Personal history of other diseases of the respiratory system: Z87.09

## 2017-01-12 ENCOUNTER — Ambulatory Visit (INDEPENDENT_AMBULATORY_CARE_PROVIDER_SITE_OTHER): Payer: Medicare Other | Admitting: Rheumatology

## 2017-01-12 ENCOUNTER — Encounter: Payer: Self-pay | Admitting: Rheumatology

## 2017-01-12 VITALS — BP 123/81 | HR 104 | Ht 66.0 in | Wt 188.0 lb

## 2017-01-12 DIAGNOSIS — L4050 Arthropathic psoriasis, unspecified: Secondary | ICD-10-CM

## 2017-01-12 DIAGNOSIS — L409 Psoriasis, unspecified: Secondary | ICD-10-CM | POA: Diagnosis not present

## 2017-01-12 DIAGNOSIS — R768 Other specified abnormal immunological findings in serum: Secondary | ICD-10-CM

## 2017-01-12 DIAGNOSIS — E038 Other specified hypothyroidism: Secondary | ICD-10-CM

## 2017-01-12 DIAGNOSIS — Z8639 Personal history of other endocrine, nutritional and metabolic disease: Secondary | ICD-10-CM | POA: Diagnosis not present

## 2017-01-12 DIAGNOSIS — C119 Malignant neoplasm of nasopharynx, unspecified: Secondary | ICD-10-CM

## 2017-01-12 DIAGNOSIS — Z8659 Personal history of other mental and behavioral disorders: Secondary | ICD-10-CM

## 2017-01-12 DIAGNOSIS — Z8719 Personal history of other diseases of the digestive system: Secondary | ICD-10-CM

## 2017-01-12 DIAGNOSIS — Z79899 Other long term (current) drug therapy: Secondary | ICD-10-CM

## 2017-01-12 DIAGNOSIS — N200 Calculus of kidney: Secondary | ICD-10-CM | POA: Diagnosis not present

## 2017-01-12 DIAGNOSIS — Z8709 Personal history of other diseases of the respiratory system: Secondary | ICD-10-CM | POA: Diagnosis not present

## 2017-01-12 LAB — COMPLETE METABOLIC PANEL WITH GFR
ALBUMIN: 3.9 g/dL (ref 3.6–5.1)
ALK PHOS: 60 U/L (ref 33–130)
ALT: 10 U/L (ref 6–29)
AST: 14 U/L (ref 10–35)
BUN: 9 mg/dL (ref 7–25)
CALCIUM: 8.8 mg/dL (ref 8.6–10.4)
CO2: 18 mmol/L — ABNORMAL LOW (ref 20–31)
CREATININE: 1.03 mg/dL (ref 0.50–1.05)
Chloride: 111 mmol/L — ABNORMAL HIGH (ref 98–110)
GFR, Est African American: 73 mL/min (ref >=60)
GFR, Est Non African American: 63 mL/min (ref >=60)
GLUCOSE: 86 mg/dL (ref 65–99)
Potassium: 3.9 mmol/L (ref 3.5–5.3)
SODIUM: 140 mmol/L (ref 135–146)
TOTAL PROTEIN: 7 g/dL (ref 6.1–8.1)
Total Bilirubin: 0.3 mg/dL (ref 0.2–1.2)

## 2017-01-12 LAB — CBC WITH DIFFERENTIAL/PLATELET
BASOS ABS: 0 {cells}/uL (ref 0–200)
Basophils Relative: 0 %
EOS ABS: 118 {cells}/uL (ref 15–500)
EOS PCT: 2 %
HCT: 39.2 % (ref 35.0–45.0)
Hemoglobin: 12.7 g/dL (ref 11.7–15.5)
LYMPHS PCT: 30 %
Lymphs Abs: 1770 cells/uL (ref 850–3900)
MCH: 30.6 pg (ref 27.0–33.0)
MCHC: 32.4 g/dL (ref 32.0–36.0)
MCV: 94.5 fL (ref 80.0–100.0)
MONOS PCT: 7 %
MPV: 10 fL (ref 7.5–12.5)
Monocytes Absolute: 413 cells/uL (ref 200–950)
NEUTROS PCT: 61 %
Neutro Abs: 3599 cells/uL (ref 1500–7800)
PLATELETS: 313 10*3/uL (ref 140–400)
RBC: 4.15 MIL/uL (ref 3.80–5.10)
RDW: 13.3 % (ref 11.0–15.0)
WBC: 5.9 10*3/uL (ref 3.8–10.8)

## 2017-01-12 NOTE — Patient Instructions (Addendum)
Standing Labs We placed an order today for your standing lab work.    Please come back and get your standing labs in October and every 3 months TB gold due with next labs  We have open lab Monday through Friday from 8:30-11:30 AM and 1:30-4 PM at the office of Dr. Bo Merino.   The office is located at [address] [place], [place], [place] [address] No appointment is necessary.   Labs are drawn by [facility].  You may receive a bill from [facility] for your lab work. If you have any questions regarding directions or hours of operation,  please call [phone].

## 2017-01-13 NOTE — Progress Notes (Signed)
Labs are stable.

## 2017-02-14 ENCOUNTER — Other Ambulatory Visit: Payer: Self-pay | Admitting: Obstetrics

## 2017-02-14 DIAGNOSIS — Z3041 Encounter for surveillance of contraceptive pills: Secondary | ICD-10-CM

## 2017-02-15 ENCOUNTER — Other Ambulatory Visit: Payer: Self-pay | Admitting: Obstetrics

## 2017-02-15 DIAGNOSIS — Z3041 Encounter for surveillance of contraceptive pills: Secondary | ICD-10-CM

## 2017-04-03 NOTE — Progress Notes (Deleted)
Office Visit Note  Patient: [name]             Date of Birth: Jul 26, 1965           MRN: [id number]             PCP: Nolene Ebbs, MD Referring: Nolene Ebbs, MD Visit Date: 04/15/2017 Occupation: @GUAROCC @    Subjective:  No chief complaint on file.   History of Present Illness: [name] is a 51 y.o. female ***   Activities of Daily Living:  Patient reports morning stiffness for *** {minute/hour:19697}.   Patient {ACTIONS;DENIES/REPORTS:21021675::"Denies"} nocturnal pain.  Difficulty dressing/grooming: {ACTIONS;DENIES/REPORTS:21021675::"Denies"} Difficulty climbing stairs: {ACTIONS;DENIES/REPORTS:21021675::"Denies"} Difficulty getting out of chair: {ACTIONS;DENIES/REPORTS:21021675::"Denies"} Difficulty using hands for taps, buttons, cutlery, and/or writing: {ACTIONS;DENIES/REPORTS:21021675::"Denies"}   No Rheumatology ROS completed.   PMFS History:  Patient Active Problem List   Diagnosis Date Noted  . 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 History of asthma 01/10/2017  . Psoriatic arthritis ([place]) 08/10/2016  . High risk medication use 08/10/2016  . Chronic pain syndrome 08/22/2013  . Pulmonary infiltrates 06/30/2013  . Psoriasis   . GERD (gastroesophageal reflux disease)   . Hypothyroidism   . Pleurisy 06/19/2013  . Caries 03/04/2013  . Nephrolithiasis 04/05/2012  . Hydronephrosis 04/05/2012  . UTI (lower urinary tract infection) 04/05/2012  . On prednisone therapy 04/05/2012  . Positive ANA (antinuclear antibody) 04/05/2012    Past Medical History:  Diagnosis Date  . Allergy   . Anxiety   . Arthritis    ra, lupus - ankles, knees, hands, neck  . Blood transfusion without reported diagnosis [place] with c/s surgery  . Bronchitis    Hx  . Cancer (HCC)    nasopharynx  . COPD (chronic obstructive pulmonary disease) ([place])   . DDD (degenerative disc disease), lumbar   . Diabetes mellitus without complication ([place])    related to prednisone use, no meds,cbg normal  . Diverticulosis   . Excessive or frequent menstruation   . GERD (gastroesophageal reflux disease)   . Headache(784.0)   . Hypothyroidism   . Irregular menstrual cycle   . Leiomyoma of uterus, unspecified   . Lupus   . Neuromuscular disorder (HCC)    neuropathy in legs/feet  . Neuropathy   . Papanicolaou smear of cervix with atypical squamous cells of undetermined significance (ASC-US)   . Psoriasis   . Sinusitis   . Trichimoniasis   . Wears partial dentures    full upper and lower partial    Family History  Problem Relation Age of Onset  . Lupus Brother   . Breast cancer Maternal Grandmother 16  . Hypertension Mother   . Diabetes Mother   . Hypertension Father   . Colon cancer Neg Hx   . Esophageal cancer Neg Hx   . Rectal cancer Neg Hx   . Stomach cancer Neg Hx    Past Surgical History:  Procedure Laterality Date  . ANKLE ARTHROSCOPY Right   . CESAREAN SECTION     x 1  . DILATION AND CURETTAGE OF UTERUS     x 1 Missed Abortion  . LYMPH NODE BIOPSY     neck  . MULTIPLE EXTRACTIONS WITH ALVEOLOPLASTY N/A 03/05/2013   Procedure: MULTIPLE EXTRACION WITH ALVEOLOPLASTY, extraction of decayed teeth numbers 3,4,5,18,19,22,23,24,25, extraction of retained root tips teeth numbers 2,20,21,26, bilateral mandibular alveoloplasty and upper right maxillary alveoloplasty;  Surgeon: Isac Caddy, DDS;  Location: [facility];  Service: Oral Surgery;  Laterality:  N/A;  . scalp biopsy    . TOOTH EXTRACTION Bilateral 03/05/2013   Procedure: EXTRACTION MOLARS;  Surgeon: Isac Caddy, DDS;  Location: [place];  Service: Oral Surgery;  Laterality: Bilateral;   Social History   Social History Narrative  . No narrative on file     Objective: Vital Signs: There were no vitals taken for this visit.   Physical Exam   Musculoskeletal Exam: ***  CDAI Exam: No CDAI exam completed.    Investigation: No additional findings.TB Gold:  negative 12/23/2016 @[facility]  CBC Latest Ref Rng & Units 01/12/2017 10/14/2016 08/11/2016  WBC 3.8 - 10.8 K/uL 5.9 5.8 8.6  Hemoglobin 11.7 - 15.5 g/dL 12.7 12.4 11.2  Hematocrit 35.0 - 45.0 % 39.2 38.7 35.6  Platelets 140 - 400 K/uL 313 331 408(H)   CMP Latest Ref Rng & Units 01/12/2017 10/14/2016 08/11/2016  Glucose 65 - 99 mg/dL 86 102(H) 77  BUN 7 - 25 mg/dL 9 9 9   Creatinine 0.50 - 1.05 mg/dL 1.03 0.79 0.86  Sodium 135 - 146 mmol/L 140 142 142  Potassium 3.5 - 5.3 mmol/L 3.9 3.8 4.2  Chloride 98 - 110 mmol/L 111(H) 106 105  CO2 20 - 31 mmol/L 18(L) 20 21  Calcium 8.6 - 10.4 mg/dL 8.8 9.0 9.2  Total Protein 6.1 - 8.1 g/dL 7.0 7.2 7.4  Total Bilirubin 0.2 - 1.2 mg/dL 0.3 0.3 0.2  Alkaline Phos 33 - 130 U/L 60 66 97  AST 10 - 35 U/L 14 13 13   ALT 6 - 29 U/L 10 7 10    Imaging: No results found.  Speciality Comments: No specialty comments available.    Procedures:  No procedures performed Allergies: Amoxicillin; Latex; Percocet [oxycodone-acetaminophen]; Plaquenil [hydroxychloroquine sulfate]; Bactrim [sulfamethoxazole-trimethoprim]; Ciprofloxacin hcl; Keflex [cephalexin]; Penicillins; and Tetracyclines & related   Assessment / Plan:     Visit Diagnoses: Psoriatic arthritis ([place])  Psoriasis  ANA positive - a titer of 1:40 and nucleolar pattern negative ENA  negative CCP  High risk medication use - Dr. 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  No orders of the defined types were placed in this encounter.   Face-to-face time spent with patient was *** minutes. 50% of time was spent in counseling and coordination of care.  Follow-Up Instructions: No  Follow-up on file.   Bo Merino, MD  Note - This record has been created using Editor, commissioning.  Chart creation errors have been sought, but may not always  have been located. Such creation errors do not reflect on  the standard of medical care.

## 2017-04-15 ENCOUNTER — Ambulatory Visit: Payer: Medicare Other | Admitting: Rheumatology

## 2017-04-20 NOTE — Progress Notes (Signed)
Office Visit Note  Patient: [name]             Date of Birth: 03-Oct-1965           MRN: [id number]             PCP: Nolene Ebbs, MD Referring: Nolene Ebbs, MD Visit Date: 04/21/2017 Occupation: @GUAROCC @    Subjective:  Pain left ankle   History of Present Illness: [name] is a 51 y.o. female with history of psoriatic arthritis and psoriasis. She is on Cosyntex for psoriasis. Which is controlling her arthritis as well. She's been having pain and discomfort in her left ankle for the last few weeks. She denies any history of injury. She states with over use of her hands she gets intermittent discomfort and mild swelling. She does not have any rash from psoriasis. She states she currently have a sinus infection and has an appointment coming up with her primary care physician on Monday.  Activities of Daily Living:  Patient reports morning stiffness for 0 minute.   Patient Denies nocturnal pain.  Difficulty dressing/grooming: Denies Difficulty climbing stairs: Denies Difficulty getting out of chair: Denies Difficulty using hands for taps, buttons, cutlery, and/or writing: Denies   Review of Systems  Constitutional: Positive for fatigue. Negative for night sweats, weight gain, weight loss and weakness.  HENT: Negative for mouth sores, trouble swallowing, trouble swallowing, mouth dryness and nose dryness.   Eyes: Negative for pain, redness, visual disturbance and dryness.  Respiratory: Negative for cough, shortness of breath and difficulty breathing.   Cardiovascular: Positive for hypertension. Negative for chest pain, palpitations, irregular heartbeat and swelling in legs/feet.  Gastrointestinal: Negative for blood in stool, constipation and diarrhea.  Endocrine: Negative for increased urination.  Genitourinary: Negative for vaginal dryness.  Musculoskeletal: Positive for arthralgias and joint pain. Negative for joint swelling, myalgias, muscle weakness,  morning stiffness, muscle tenderness and myalgias.  Skin: Negative for color change, rash, hair loss, skin tightness, ulcers and sensitivity to sunlight.  Allergic/Immunologic: Negative for susceptible to infections.  Neurological: Negative for dizziness, memory loss and night sweats.  Hematological: Negative for swollen glands.  Psychiatric/Behavioral: Negative for depressed mood and sleep disturbance. The patient is not nervous/anxious.     PMFS History:  Patient Active Problem List   Diagnosis Date Noted  . 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 History of asthma 01/10/2017  . Psoriatic arthritis ([place]) 08/10/2016  . High risk medication use 08/10/2016  . Chronic pain syndrome 08/22/2013  . Pulmonary infiltrates 06/30/2013  . Psoriasis   . GERD (gastroesophageal reflux disease)   . Hypothyroidism   . Pleurisy 06/19/2013  . Caries 03/04/2013  . Nephrolithiasis 04/05/2012  . Hydronephrosis 04/05/2012  . UTI (lower urinary tract infection) 04/05/2012  . On prednisone therapy 04/05/2012  . Positive ANA (antinuclear antibody) 04/05/2012    Past Medical History:  Diagnosis Date  . Allergy   . Anxiety   . Arthritis    ra, lupus - ankles, knees, hands, neck  . Blood transfusion without reported diagnosis [place] with c/s surgery  . Bronchitis    Hx  . Cancer (HCC)    nasopharynx  . COPD (chronic obstructive pulmonary disease) ([place])   . DDD (degenerative disc disease), lumbar   . Diabetes mellitus without complication ([place])    related to prednisone use, no meds,cbg normal  . Diverticulosis   . Excessive or frequent menstruation   . GERD (gastroesophageal reflux disease)   .  Headache(784.0)   . Hypothyroidism   . Irregular menstrual cycle   . Leiomyoma of uterus, unspecified   . Lupus   . Neuromuscular disorder (HCC)    neuropathy in legs/feet  . Neuropathy   . Papanicolaou smear of cervix with atypical squamous cells of undetermined significance (ASC-US)     . Psoriasis   . Sinusitis   . Trichimoniasis   . Wears partial dentures    full upper and lower partial    Family History  Problem Relation Age of Onset  . Lupus Brother   . Breast cancer Maternal Grandmother 9  . Hypertension Mother   . Diabetes Mother   . Hypertension Father   . Colon cancer Neg Hx   . Esophageal cancer Neg Hx   . Rectal cancer Neg Hx   . Stomach cancer Neg Hx    Past Surgical History:  Procedure Laterality Date  . ANKLE ARTHROSCOPY Right   . CESAREAN SECTION     x 1  . DILATION AND CURETTAGE OF UTERUS     x 1 Missed Abortion  . LYMPH NODE BIOPSY     neck  . MULTIPLE EXTRACTIONS WITH ALVEOLOPLASTY N/A 03/05/2013   Procedure: MULTIPLE EXTRACION WITH ALVEOLOPLASTY, extraction of decayed teeth numbers 3,4,5,18,19,22,23,24,25, extraction of retained root tips teeth numbers 2,20,21,26, bilateral mandibular alveoloplasty and upper right maxillary alveoloplasty;  Surgeon: Isac Caddy, DDS;  Location: [facility];  Service: Oral Surgery;  Laterality: N/A;  . scalp biopsy    . TOOTH EXTRACTION Bilateral 03/05/2013   Procedure: EXTRACTION MOLARS;  Surgeon: Isac Caddy, DDS;  Location: [facility];  Service: Oral Surgery;  Laterality: Bilateral;   Social History   Social History Narrative  . No narrative on file     Objective: Vital Signs: BP 131/90 (BP Location: Left Arm, Patient Position: Sitting, Cuff Size: Normal)   Pulse 95   Resp 17   Ht 5\' 6"  (1.676 m)   Wt 194 lb (88 kg)   BMI 31.31 kg/m    Physical Exam  Constitutional: She is oriented to person, place, and time. She appears well-developed and well-nourished.  HENT:  Head: Normocephalic and atraumatic.  Eyes: Conjunctivae and EOM are normal.  Neck: Normal range of motion.  Cardiovascular: Normal rate, regular rhythm, normal heart sounds and intact distal pulses.   Pulmonary/Chest: Effort normal and breath sounds normal.  Abdominal: Soft. Bowel sounds are normal.  Lymphadenopathy:     She has no cervical adenopathy.  Neurological: She is alert and oriented to person, place, and time.  Skin: Skin is warm and dry. Capillary refill takes less than 2 seconds.  Psychiatric: She has a normal mood and affect. Her behavior is normal.  Nursing note and vitals reviewed.    Musculoskeletal Exam: C-spine and thoracic lumbar spine good range of motion with no discomfort. Shoulder joints elbow joints wrist joints are good range of motion. She has some tenderness across her PIP/DIP joints. Hip joints knee joints with good range of motion. She is mild swelling over left ankle joint. She also has tenderness across her MTPs and PIPs. No synovitis was noted.  CDAI Exam: CDAI Homunculus Exam:   Joint Counts:  CDAI Tender Joint count: 0 CDAI Swollen Joint count: 0  Global Assessments:  Patient Global Assessment: 0 Provider Global Assessment: 0    Investigation: No additional findings.TB Gold: August 2018 by Dr. Barry Dienes CBC Latest Ref Rng & Units 01/12/2017 10/14/2016 08/11/2016  WBC 3.8 - 10.8 K/uL 5.9 5.8 8.6  Hemoglobin 11.7 - 15.5 g/dL 12.7 12.4 11.2  Hematocrit 35.0 - 45.0 % 39.2 38.7 35.6  Platelets 140 - 400 K/uL 313 331 408(H)   CMP Latest Ref Rng & Units 01/12/2017 10/14/2016 08/11/2016  Glucose 65 - 99 mg/dL 86 102(H) 77  BUN 7 - 25 mg/dL 9 9 9   Creatinine 0.50 - 1.05 mg/dL 1.03 0.79 0.86  Sodium 135 - 146 mmol/L 140 142 142  Potassium 3.5 - 5.3 mmol/L 3.9 3.8 4.2  Chloride 98 - 110 mmol/L 111(H) 106 105  CO2 20 - 31 mmol/L 18(L) 20 21  Calcium 8.6 - 10.4 mg/dL 8.8 9.0 9.2  Total Protein 6.1 - 8.1 g/dL 7.0 7.2 7.4  Total Bilirubin 0.2 - 1.2 mg/dL 0.3 0.3 0.2  Alkaline Phos 33 - 130 U/L 60 66 97  AST 10 - 35 U/L 14 13 13   ALT 6 - 29 U/L 10 7 10     Imaging: Xr Ankle 2 Views Left  Result Date: 04/21/2017 Note tibiotalar joint space narrowing was noted. No erosive changes were noted. Subtalar joint space narrowing was noted. Impression: These finds a consistent with  arthritis of the ankle joint.  Xr Foot 2 Views Left  Result Date: 04/21/2017 First MTP narrowing and subluxation noted. All PIP/DIP narrowing was noted. No erosive changes were noted. No intertarsal joint space narrowing was noted. Impression: These findings are consistent with osteoarthritis of the foot.  Xr Foot 2 Views Right  Result Date: 04/21/2017 First MTP narrowing and subluxation noted. All PIP/DIP joint narrowing was noted. No significant intertarsal joint space narrowing was noted. Plates and screws noted in tibiotarsal joint. Impression these findings were consistent with osteoarthritis of the foot.  Xr Hand 2 View Left  Result Date: 04/21/2017 All MCP joint narrowing with juxta-articular osteopenia was noted. Erosive changes were noted in first and second MCP joints. All PIP/DIP narrowing was noted. Erosion was noted in the second PIP joint. Intercarpal and radiocarpal joint space narrowing was noted. 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  Xr Hand 2 View Right  Result Date: 04/21/2017 Juxta articular osteopenia noted. Narrowing of all MCP joints were noted. PIP/DIP narrowing was noted. Most prominent in the fifth finger PIP and DIP joints. An erosion was noted on the base of first metacarpal. Intercarpal and radiocarpal narrowing was noted. Some erosive changes were noted on the ulnar styloid area. 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    Procedures:  No procedures performed Allergies: Amoxicillin; Latex; Percocet [oxycodone-acetaminophen]; Plaquenil [hydroxychloroquine sulfate]; Bactrim [sulfamethoxazole-trimethoprim]; Ciprofloxacin hcl; Keflex [cephalexin]; Penicillins; and Tetracyclines & related   Assessment / Plan:     Visit Diagnoses: Psoriatic arthritis ([place]) - She's doing much better on Cosyntex.  Although she's been having increase arthralgias lately.  Psoriasis: She has no active lesions of psoriasis. She is some hyperpigmentation from previous lesions.  Pain in both hands - she complains of increasing stiffness in her hands with doing certain activities. Plan: XR Hand 2 View Right, XR Hand 2 View Left  Pain in both feet -she has some feet discomfort but no swelling or synovitis today. Plan: XR Foot 2 Views Right, XR Foot 2 Views Left  Pain in left ankle and joints of left foot: She's been having swelling in her left ankle joint. Plan: XR Ankle 2 Views Left  Positive ANA (antinuclear antibody) - a titer of 1:40 and nucleolar pattern negative ENA  negative CCP  High risk medication use - Dr. Sharol Roussel prescribes - Cosentyx 300 mg sq q month(March 2018). Patient reports having a TB gold recently by her dermatologist. We will check CBC and CMP today. Then she will need labs every 3 months. 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 XR Hand 2 View Right  . XR Hand 2 View Left  . XR Foot 2 Views Right  . XR Foot 2 Views Left  . XR Ankle 2 Views Left   No orders of the defined types were placed in this encounter.   Face-to-face time spent with patient was 30 minutes. Greater than 50% of time was spent in counseling and coordination of care.  Follow-Up Instructions: Return in about 6 months (around 10/19/2017) for Psoriatic arthritis, Ps.   Bo Merino, MD  Note - This record has been created using Editor, commissioning.  Chart creation errors have been sought, but may not always  have been located. Such creation errors do not reflect on  the standard of medical care.

## 2017-04-21 ENCOUNTER — Ambulatory Visit (INDEPENDENT_AMBULATORY_CARE_PROVIDER_SITE_OTHER): Payer: Self-pay

## 2017-04-21 ENCOUNTER — Ambulatory Visit (INDEPENDENT_AMBULATORY_CARE_PROVIDER_SITE_OTHER): Payer: Medicare Other | Admitting: Rheumatology

## 2017-04-21 ENCOUNTER — Ambulatory Visit (INDEPENDENT_AMBULATORY_CARE_PROVIDER_SITE_OTHER): Payer: Medicare Other

## 2017-04-21 ENCOUNTER — Encounter: Payer: Self-pay | Admitting: Rheumatology

## 2017-04-21 ENCOUNTER — Telehealth: Payer: Self-pay

## 2017-04-21 VITALS — BP 131/90 | HR 95 | Resp 17 | Ht 66.0 in | Wt 194.0 lb

## 2017-04-21 DIAGNOSIS — L4050 Arthropathic psoriasis, unspecified: Secondary | ICD-10-CM | POA: Diagnosis not present

## 2017-04-21 DIAGNOSIS — R768 Other specified abnormal immunological findings in serum: Secondary | ICD-10-CM | POA: Diagnosis not present

## 2017-04-21 DIAGNOSIS — M79641 Pain in right hand: Secondary | ICD-10-CM

## 2017-04-21 DIAGNOSIS — C119 Malignant neoplasm of nasopharynx, unspecified: Secondary | ICD-10-CM | POA: Diagnosis not present

## 2017-04-21 DIAGNOSIS — M79642 Pain in left hand: Secondary | ICD-10-CM | POA: Diagnosis not present

## 2017-04-21 DIAGNOSIS — N200 Calculus of kidney: Secondary | ICD-10-CM

## 2017-04-21 DIAGNOSIS — Z79899 Other long term (current) drug therapy: Secondary | ICD-10-CM | POA: Diagnosis not present

## 2017-04-21 DIAGNOSIS — L409 Psoriasis, unspecified: Secondary | ICD-10-CM

## 2017-04-21 DIAGNOSIS — M79671 Pain in right foot: Secondary | ICD-10-CM

## 2017-04-21 DIAGNOSIS — Z8709 Personal history of other diseases of the respiratory system: Secondary | ICD-10-CM | POA: Diagnosis not present

## 2017-04-21 DIAGNOSIS — Z8659 Personal history of other mental and behavioral disorders: Secondary | ICD-10-CM

## 2017-04-21 DIAGNOSIS — M79672 Pain in left foot: Secondary | ICD-10-CM

## 2017-04-21 DIAGNOSIS — Z8719 Personal history of other diseases of the digestive system: Secondary | ICD-10-CM

## 2017-04-21 DIAGNOSIS — Z8639 Personal history of other endocrine, nutritional and metabolic disease: Secondary | ICD-10-CM | POA: Diagnosis not present

## 2017-04-21 DIAGNOSIS — M25572 Pain in left ankle and joints of left foot: Secondary | ICD-10-CM

## 2017-04-21 DIAGNOSIS — R7689 Other specified abnormal immunological findings in serum: Secondary | ICD-10-CM

## 2017-04-21 LAB — COMPLETE METABOLIC PANEL WITH GFR
AG Ratio: 1.3 (calc) (ref 1.0–2.5)
ALT: 11 U/L (ref 6–29)
AST: 14 U/L (ref 10–35)
Albumin: 4.1 g/dL (ref 3.6–5.1)
Alkaline phosphatase (APISO): 54 U/L (ref 33–130)
BUN: 9 mg/dL (ref 7–25)
CALCIUM: 9.6 mg/dL (ref 8.6–10.4)
CO2: 25 mmol/L (ref 20–32)
Chloride: 108 mmol/L (ref 98–110)
Creat: 1 mg/dL (ref 0.50–1.05)
GFR, EST AFRICAN AMERICAN: 75 mL/min/{1.73_m2} (ref >=60)
GFR, EST NON AFRICAN AMERICAN: 65 mL/min/{1.73_m2} (ref >=60)
GLUCOSE: 106 mg/dL — AB (ref 65–99)
Globulin: 3.2 g/dL (calc) (ref 1.9–3.7)
Potassium: 4.1 mmol/L (ref 3.5–5.3)
Sodium: 141 mmol/L (ref 135–146)
TOTAL PROTEIN: 7.3 g/dL (ref 6.1–8.1)
Total Bilirubin: 0.3 mg/dL (ref 0.2–1.2)

## 2017-04-21 LAB — CBC WITH DIFFERENTIAL/PLATELET
Basophils Absolute: 33 cells/uL (ref 0–200)
Basophils Relative: 0.5 %
EOS PCT: 1.4 %
Eosinophils Absolute: 92 cells/uL (ref 15–500)
HCT: 39 % (ref 35.0–45.0)
Hemoglobin: 13 g/dL (ref 11.7–15.5)
Lymphs Abs: 1828 cells/uL (ref 850–3900)
MCH: 30.9 pg (ref 27.0–33.0)
MCHC: 33.3 g/dL (ref 32.0–36.0)
MCV: 92.6 fL (ref 80.0–100.0)
MONOS PCT: 6.1 %
MPV: 10.8 fL (ref 7.5–12.5)
Neutro Abs: 4244 cells/uL (ref 1500–7800)
Neutrophils Relative %: 64.3 %
PLATELETS: 323 10*3/uL (ref 140–400)
RBC: 4.21 10*6/uL (ref 3.80–5.10)
RDW: 11.6 % (ref 11.0–15.0)
TOTAL LYMPHOCYTE: 27.7 %
WBC: 6.6 10*3/uL (ref 3.8–10.8)
WBCMIX: 403 {cells}/uL (ref 200–950)

## 2017-04-21 NOTE — Patient Instructions (Signed)
Standing Labs We placed an order today for your standing lab work.    Please come back and get your standing labs in January and every 3 months  We have open lab Monday through Friday from 8:30-11:30 AM and 1:30-4 PM at the office of Dr. Ayshia Gramlich.   The office is located at [address], [address], [place], [place] [address] No appointment is necessary.   Labs are drawn by [facility].  You may receive a bill from [facility] for your lab work. If you have any questions regarding directions or hours of operation,  please call [phone].    

## 2017-04-21 NOTE — Telephone Encounter (Signed)
Left message to advise patient of xray results from today's visit.

## 2017-04-22 NOTE — Progress Notes (Signed)
WNL

## 2017-10-10 NOTE — Progress Notes (Addendum)
Office Visit Note  Patient: [name]             Date of Birth: 1965-12-13           MRN: [id number]             PCP: Nolene Ebbs, MD Referring: Nolene Ebbs, MD Visit Date: 10/21/2017 Occupation: @GUAROCC @    Subjective:  Medication monitoring    History of Present Illness: [name] is a 52 y.o. female with history of psoriatic arthritis and positive ANA. Patient states she continues to inject Cosentyx every 28 days.  She is due for her next injection next week.  She reports she has no joint pain, joint swelling, or joint stiffness at this time.  She has no active psoriasis.  She follows up with her dermatologist every 3 months.  She continues to have hyperpigmentation, and her dermatologist recommend that she go in the sun occasionally.  She denies any plantar fasciitis or achilles tendonitis.  She is having intermittent bilateral sciatica.  She states that yesterday she was experiencing stiffness in her bilateral hands due to performing overuse activities. She does feel like her grip strength is decreasing.  She will occasionally take tylenol for pain relief if sciatica is flaring up.  She feels Cosentyx has been considerably more effective than Stelara.   Activities of Daily Living:  Patient reports morning stiffness for 0  minutes.   Patient Denies nocturnal pain.  Difficulty dressing/grooming: Denies Difficulty climbing stairs: Denies Difficulty getting out of chair: Denies Difficulty using hands for taps, buttons, cutlery, and/or writing: Denies   Review of Systems  Constitutional: Negative for fatigue.  HENT: Negative for mouth sores, mouth dryness and nose dryness.   Eyes: Negative for pain, visual disturbance and dryness.  Respiratory: Negative for cough, hemoptysis, shortness of breath and difficulty breathing.   Cardiovascular: Negative for chest pain, palpitations, hypertension and swelling in legs/feet.  Gastrointestinal: Negative for blood in  stool, constipation and diarrhea.  Endocrine: Negative for increased urination.  Genitourinary: Negative for painful urination.  Musculoskeletal: Negative for arthralgias, joint pain, joint swelling, myalgias, muscle weakness, morning stiffness, muscle tenderness and myalgias.  Skin: Negative for color change, pallor, rash, hair loss, nodules/bumps, skin tightness, ulcers and sensitivity to sunlight.  Allergic/Immunologic: Negative for susceptible to infections.  Neurological: Negative for dizziness, numbness, headaches and weakness.  Hematological: Negative for swollen glands.  Psychiatric/Behavioral: Negative for depressed mood and sleep disturbance. The patient is not nervous/anxious.     PMFS History:  Patient Active Problem List   Diagnosis Date Noted  . 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 History of asthma 01/10/2017  . Psoriatic arthritis ([place]) 08/10/2016  . High risk medication use 08/10/2016  . Chronic pain syndrome 08/22/2013  . Pulmonary infiltrates 06/30/2013  . Psoriasis   . GERD (gastroesophageal reflux disease)   . Hypothyroidism   . Pleurisy 06/19/2013  . Caries 03/04/2013  . Nephrolithiasis 04/05/2012  . Hydronephrosis 04/05/2012  . UTI (lower urinary tract infection) 04/05/2012  . On prednisone therapy 04/05/2012  . Positive ANA (antinuclear antibody) 04/05/2012    Past Medical History:  Diagnosis Date  . Allergy   . Anxiety   . Arthritis    ra, lupus - ankles, knees, hands, neck  . Blood transfusion without reported diagnosis [place] with c/s surgery  . Bronchitis    Hx  . Cancer (HCC)    nasopharynx  . COPD (chronic obstructive pulmonary disease) ([place])   .  DDD (degenerative disc disease), lumbar   . Diabetes mellitus without complication ([place])    related to prednisone use, no meds,cbg normal  . Diverticulosis   . Excessive or frequent menstruation   . GERD (gastroesophageal reflux disease)   . Headache(784.0)   . Hypothyroidism   .  Irregular menstrual cycle   . Leiomyoma of uterus, unspecified   . Lupus ([place])   . Neuromuscular disorder (HCC)    neuropathy in legs/feet  . Neuropathy   . Papanicolaou smear of cervix with atypical squamous cells of undetermined significance (ASC-US)   . Psoriasis   . Sinusitis   . Trichimoniasis   . Wears partial dentures    full upper and lower partial    Family History  Problem Relation Age of Onset  . Lupus Brother   . Breast cancer Maternal Grandmother 57  . Hypertension Mother   . Diabetes Mother   . Hypertension Father   . Colon cancer Neg Hx   . Esophageal cancer Neg Hx   . Rectal cancer Neg Hx   . Stomach cancer Neg Hx    Past Surgical History:  Procedure Laterality Date  . ANKLE ARTHROSCOPY Right   . CESAREAN SECTION     x 1  . DILATION AND CURETTAGE OF UTERUS     x 1 Missed Abortion  . LYMPH NODE BIOPSY     neck  . MULTIPLE EXTRACTIONS WITH ALVEOLOPLASTY N/A 03/05/2013   Procedure: MULTIPLE EXTRACION WITH ALVEOLOPLASTY, extraction of decayed teeth numbers 3,4,5,18,19,22,23,24,25, extraction of retained root tips teeth numbers 2,20,21,26, bilateral mandibular alveoloplasty and upper right maxillary alveoloplasty;  Surgeon: Isac Caddy, DDS;  Location: [facility];  Service: Oral Surgery;  Laterality: N/A;  . scalp biopsy    . TOOTH EXTRACTION Bilateral 03/05/2013   Procedure: EXTRACTION MOLARS;  Surgeon: Isac Caddy, DDS;  Location: [facility];  Service: Oral Surgery;  Laterality: Bilateral;   Social History   Social History Narrative  . Not on file     Objective: Vital Signs: BP 126/90 (BP Location: Left Arm, Patient Position: Sitting, Cuff Size: Normal)   Pulse 94   Resp 16   Ht 5\' 6"  (1.676 m)   Wt 195 lb 8 oz (88.7 kg)   BMI 31.55 kg/m    Physical Exam  Constitutional: She is oriented to person, place, and time. She appears well-developed and well-nourished.  HENT:  Head: Normocephalic and atraumatic.  Eyes: Conjunctivae and EOM are  normal.  Neck: Normal range of motion.  Cardiovascular: Normal rate, regular rhythm, normal heart sounds and intact distal pulses.  Pulmonary/Chest: Effort normal and breath sounds normal.  Abdominal: Soft. Bowel sounds are normal.  Lymphadenopathy:    She has no cervical adenopathy.  Neurological: She is alert and oriented to person, place, and time.  Skin: Skin is warm and dry. Capillary refill takes less than 2 seconds.  Patches of hyperpigmentation   Psychiatric: She has a normal mood and affect. Her behavior is normal.  Nursing note and vitals reviewed.    Musculoskeletal Exam: C-spine, thoracic spine, lumbar spine good range of motion.  She has mild tenderness of bilateral SI joints.  She has no midline spinal tenderness.  Shoulder joints, elbow joints, wrist joints, MCPs, PIPs, DIPs good range of motion with no synovitis.  Hip joints, knee joints, ankle joints, MTPs, PIPs, DIPs good range of motion with no synovitis.  No warmth or effusion of bilateral knee joints.  She has some swelling in her right ankle.  No tenderness of trochanteric bursa.   CDAI Exam: No CDAI exam completed.    Investigation: No additional findings.TB gold: August 2018 by Dr. Barry Dienes CBC Latest Ref Rng & Units 04/21/2017 01/12/2017 10/14/2016  WBC 3.8 - 10.8 Thousand/uL 6.6 5.9 5.8  Hemoglobin 11.7 - 15.5 g/dL 13.0 12.7 12.4  Hematocrit 35.0 - 45.0 % 39.0 39.2 38.7  Platelets 140 - 400 Thousand/uL 323 313 331   CMP Latest Ref Rng & Units 04/21/2017 01/12/2017 10/14/2016  Glucose 65 - 99 mg/dL 106(H) 86 102(H)  BUN 7 - 25 mg/dL 9 9 9   Creatinine 0.50 - 1.05 mg/dL 1.00 1.03 0.79  Sodium 135 - 146 mmol/L 141 140 142  Potassium 3.5 - 5.3 mmol/L 4.1 3.9 3.8  Chloride 98 - 110 mmol/L 108 111(H) 106  CO2 20 - 32 mmol/L 25 18(L) 20  Calcium 8.6 - 10.4 mg/dL 9.6 8.8 9.0  Total Protein 6.1 - 8.1 g/dL 7.3 7.0 7.2  Total Bilirubin 0.2 - 1.2 mg/dL 0.3 0.3 0.3  Alkaline Phos 33 - 130 U/L - 60 66  AST 10 - 35 U/L 14  14 13   ALT 6 - 29 U/L 11 10 7     Imaging: No results found.  Speciality Comments: No specialty comments available.    Procedures:  No procedures performed Allergies: Amoxicillin; Latex; Percocet [oxycodone-acetaminophen]; Plaquenil [hydroxychloroquine sulfate]; Bactrim [sulfamethoxazole-trimethoprim]; Ciprofloxacin hcl; Keflex [cephalexin]; Penicillins; and Tetracyclines & related     Assessment / Plan:     Visit Diagnoses: Psoriatic arthritis ([place]): She has no synovitis or dactylitis on exam.  She has no joint pain, joint swelling, or joint stiffness. She is doing considerably better since switching from Stelara to Cosentyx.  She is due for her next Cosentyx injection next week.  She has no plantar fasciitis or achilles tendonitis.  She has mild SI joint tenderness and experiences sciatica intermittently. She will continue injecting Cosentyx every 28 days.   Psoriasis: She has no active psoriasis.  She has patches of hyperpigmentation.  She continues to follow up with her dermatologist every 3 months.   Positive ANA (antinuclear antibody) - a titer of 1:40 and nucleolar pattern negative ENA  negative CCP  High risk medication use - Dr. Sharol Roussel prescribes - Cosentyx 300 mg sq q month(March 2018). CBC, CMP, and TB gold were ordered today. She will require CBC and CMP every 3 months to monitor for drug toxicity. 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 Plan: TSH  Nephrolithiasis - calcium citrate stones     Orders: Orders Placed This Encounter  Procedures  . CBC with Differential/Platelet  . COMPLETE METABOLIC PANEL WITH GFR  .  QuantiFERON-TB Gold Plus  . TSH   No orders of the defined types were placed in this encounter.   Follow-Up Instructions: Return in about 5 months (around 03/23/2018) for Psoriatic arthritis.   Ofilia Neas, PA-C   I examined and evaluated the patient with Hazel Sams PA.  Clinically doing well.  She had good response to Cosentyx.  She had no synovitis on examination.  She has no active rash.  She only has hyperpigmentation from prior rash.  The plan of care was discussed as noted  above.  Bo Merino, MD  Note - This record has been created using [employer].  Chart creation errors have been sought, but may not always  have been located. Such creation errors do not reflect on  the standard of medical care.

## 2017-10-21 ENCOUNTER — Encounter: Payer: Self-pay | Admitting: Rheumatology

## 2017-10-21 ENCOUNTER — Encounter (INDEPENDENT_AMBULATORY_CARE_PROVIDER_SITE_OTHER): Payer: Self-pay

## 2017-10-21 ENCOUNTER — Ambulatory Visit (INDEPENDENT_AMBULATORY_CARE_PROVIDER_SITE_OTHER): Payer: Medicare Other | Admitting: Rheumatology

## 2017-10-21 VITALS — BP 126/90 | HR 94 | Resp 16 | Ht 66.0 in | Wt 195.5 lb

## 2017-10-21 DIAGNOSIS — Z8719 Personal history of other diseases of the digestive system: Secondary | ICD-10-CM

## 2017-10-21 DIAGNOSIS — L409 Psoriasis, unspecified: Secondary | ICD-10-CM

## 2017-10-21 DIAGNOSIS — R768 Other specified abnormal immunological findings in serum: Secondary | ICD-10-CM

## 2017-10-21 DIAGNOSIS — N200 Calculus of kidney: Secondary | ICD-10-CM | POA: Diagnosis not present

## 2017-10-21 DIAGNOSIS — Z8709 Personal history of other diseases of the respiratory system: Secondary | ICD-10-CM

## 2017-10-21 DIAGNOSIS — Z8659 Personal history of other mental and behavioral disorders: Secondary | ICD-10-CM | POA: Diagnosis not present

## 2017-10-21 DIAGNOSIS — Z8639 Personal history of other endocrine, nutritional and metabolic disease: Secondary | ICD-10-CM

## 2017-10-21 DIAGNOSIS — C119 Malignant neoplasm of nasopharynx, unspecified: Secondary | ICD-10-CM | POA: Diagnosis not present

## 2017-10-21 DIAGNOSIS — Z79899 Other long term (current) drug therapy: Secondary | ICD-10-CM | POA: Diagnosis not present

## 2017-10-21 DIAGNOSIS — L4050 Arthropathic psoriasis, unspecified: Secondary | ICD-10-CM | POA: Diagnosis not present

## 2017-10-21 LAB — TSH: TSH: 3.64 m[IU]/L

## 2017-10-21 NOTE — Patient Instructions (Signed)
Standing Labs We placed an order today for your standing lab work.    Please come back and get your standing labs in August and every 3 months   We have open lab Monday through Friday from 8:30-11:30 AM and 1:30-4:00 PM  at the office of Dr. Shaili Deveshwar.   You may experience shorter wait times on Monday and Friday afternoons. The office is located at [address], [address], [place], [place] [address] No appointment is necessary.   Labs are drawn by [facility].  You may receive a bill from [facility] for your lab work. If you have any questions regarding directions or hours of operation,  please call [phone].    

## 2017-10-22 LAB — COMPLETE METABOLIC PANEL WITH GFR
AG Ratio: 1.3 (calc) (ref 1.0–2.5)
ALKALINE PHOSPHATASE (APISO): 49 U/L (ref 33–130)
ALT: 10 U/L (ref 6–29)
AST: 14 U/L (ref 10–35)
Albumin: 3.9 g/dL (ref 3.6–5.1)
BILIRUBIN TOTAL: 0.3 mg/dL (ref 0.2–1.2)
BUN: 14 mg/dL (ref 7–25)
CHLORIDE: 110 mmol/L (ref 98–110)
CO2: 24 mmol/L (ref 20–32)
Calcium: 9.2 mg/dL (ref 8.6–10.4)
Creat: 1 mg/dL (ref 0.50–1.05)
GFR, Est African American: 75 mL/min/{1.73_m2} (ref >=60)
GFR, Est Non African American: 65 mL/min/{1.73_m2} (ref >=60)
GLUCOSE: 101 mg/dL — AB (ref 65–99)
Globulin: 3 g/dL (calc) (ref 1.9–3.7)
POTASSIUM: 4 mmol/L (ref 3.5–5.3)
Sodium: 141 mmol/L (ref 135–146)
Total Protein: 6.9 g/dL (ref 6.1–8.1)

## 2017-10-22 LAB — CBC WITH DIFFERENTIAL/PLATELET
BASOS ABS: 59 {cells}/uL (ref 0–200)
BASOS PCT: 1 %
Eosinophils Absolute: 142 cells/uL (ref 15–500)
Eosinophils Relative: 2.4 %
HCT: 38.3 % (ref 35.0–45.0)
Hemoglobin: 12.8 g/dL (ref 11.7–15.5)
Lymphs Abs: 1593 cells/uL (ref 850–3900)
MCH: 30.3 pg (ref 27.0–33.0)
MCHC: 33.4 g/dL (ref 32.0–36.0)
MCV: 90.8 fL (ref 80.0–100.0)
MPV: 10.9 fL (ref 7.5–12.5)
Monocytes Relative: 5.6 %
Neutro Abs: 3776 cells/uL (ref 1500–7800)
Neutrophils Relative %: 64 %
PLATELETS: 327 10*3/uL (ref 140–400)
RBC: 4.22 10*6/uL (ref 3.80–5.10)
RDW: 11.9 % (ref 11.0–15.0)
TOTAL LYMPHOCYTE: 27 %
WBC: 5.9 10*3/uL (ref 3.8–10.8)
WBCMIX: 330 {cells}/uL (ref 200–950)

## 2017-10-23 LAB — QUANTIFERON-TB GOLD PLUS
NIL: 0.12 IU/mL
QuantiFERON-TB Gold Plus: NEGATIVE
TB1-NIL: 0.02 [IU]/mL
TB2-NIL: 0 IU/mL

## 2017-10-24 NOTE — Progress Notes (Signed)
Glucose 101. All other labs are WNL.

## 2017-10-31 ENCOUNTER — Other Ambulatory Visit: Payer: Self-pay

## 2017-10-31 ENCOUNTER — Other Ambulatory Visit (HOSPITAL_COMMUNITY)
Admission: RE | Admit: 2017-10-31 | Discharge: 2017-10-31 | Disposition: A | Discharge status: Home / Self Care | Payer: Medicare Other | Source: Ambulatory Visit | Attending: Obstetrics | Admitting: Obstetrics

## 2017-10-31 ENCOUNTER — Encounter: Payer: Self-pay | Admitting: Obstetrics

## 2017-10-31 ENCOUNTER — Ambulatory Visit (INDEPENDENT_AMBULATORY_CARE_PROVIDER_SITE_OTHER): Payer: Medicare Other | Admitting: Obstetrics

## 2017-10-31 ENCOUNTER — Other Ambulatory Visit: Payer: Self-pay | Admitting: Internal Medicine

## 2017-10-31 ENCOUNTER — Other Ambulatory Visit: Payer: Self-pay | Admitting: Obstetrics

## 2017-10-31 VITALS — BP 111/79 | HR 91 | Wt 190.9 lb

## 2017-10-31 DIAGNOSIS — Z1231 Encounter for screening mammogram for malignant neoplasm of breast: Secondary | ICD-10-CM

## 2017-10-31 DIAGNOSIS — Z780 Asymptomatic menopausal state: Secondary | ICD-10-CM | POA: Diagnosis not present

## 2017-10-31 DIAGNOSIS — Z3041 Encounter for surveillance of contraceptive pills: Secondary | ICD-10-CM

## 2017-10-31 DIAGNOSIS — Z01419 Encounter for gynecological examination (general) (routine) without abnormal findings: Secondary | ICD-10-CM | POA: Diagnosis not present

## 2017-10-31 DIAGNOSIS — A64 Unspecified sexually transmitted disease: Secondary | ICD-10-CM

## 2017-10-31 NOTE — Progress Notes (Signed)
Presents for AEX/PAP/STD.  No problems today.

## 2017-10-31 NOTE — Progress Notes (Addendum)
Subjective:        [name] is a 52 y.o. female here for a routine exam.  Current complaints: NONE.    Personal health questionnaire:  Is patient Ashkenazi Jewish, have a family history of breast and/or ovarian cancer: no Is there a family history of uterine cancer diagnosed at age < 76, gastrointestinal cancer, urinary tract cancer, family member who is a Field seismologist syndrome-associated carrier: no Is the patient overweight and hypertensive, family history of diabetes, personal history of gestational diabetes, preeclampsia or PCOS: no Is patient over 70, have PCOS,  family history of premature CHD under age 35, diabetes, smoke, have hypertension or peripheral artery disease:  no At any time, has a partner hit, kicked or otherwise hurt or frightened you?: no Over the past 2 weeks, have you felt down, depressed or hopeless?: no Over the past 2 weeks, have you felt little interest or pleasure in doing things?:no   Gynecologic History Patient's last menstrual period was 06/25/2016. Contraception: post menopausal status Last Pap: 2018. Results were: normal Last mammogram: 2018. Results were: normal  Obstetric History OB History  Gravida Para Term Preterm AB Living  2 1   1 1 1   SAB TAB Ectopic Multiple Live Births  1       1    # Outcome Date GA Lbr Len/2nd Weight Sex Delivery Anes PTL Lv  2 Preterm  [redacted]w[redacted]d       LIV  1 SAB  [redacted]w[redacted]d       DEC    Past Medical History:  Diagnosis Date  . Allergy   . Anxiety   . Arthritis    ra, lupus - ankles, knees, hands, neck  . Blood transfusion without reported diagnosis [place] with c/s surgery  . Bronchitis    Hx  . Cancer (HCC)    nasopharynx  . COPD (chronic obstructive pulmonary disease) ([place])   . DDD (degenerative disc disease), lumbar   . Diabetes mellitus without complication ([place])    related to prednisone use, no meds,cbg normal  . Diverticulosis   . Excessive or frequent menstruation   . GERD (gastroesophageal  reflux disease)   . Headache(784.0)   . Hypothyroidism   . Irregular menstrual cycle   . Leiomyoma of uterus, unspecified   . Lupus ([place])   . Neuromuscular disorder (HCC)    neuropathy in legs/feet  . Neuropathy   . Papanicolaou smear of cervix with atypical squamous cells of undetermined significance (ASC-US)   . Psoriasis   . Sinusitis   . Trichimoniasis   . Wears partial dentures    full upper and lower partial    Past Surgical History:  Procedure Laterality Date  . ANKLE ARTHROSCOPY Right   . CESAREAN SECTION     x 1  . DILATION AND CURETTAGE OF UTERUS     x 1 Missed Abortion  . LYMPH NODE BIOPSY     neck  . MULTIPLE EXTRACTIONS WITH ALVEOLOPLASTY N/A 03/05/2013   Procedure: MULTIPLE EXTRACION WITH ALVEOLOPLASTY, extraction of decayed teeth numbers 3,4,5,18,19,22,23,24,25, extraction of retained root tips teeth numbers 2,20,21,26, bilateral mandibular alveoloplasty and upper right maxillary alveoloplasty;  Surgeon: Isac Caddy, DDS;  Location: [facility];  Service: Oral Surgery;  Laterality: N/A;  . scalp biopsy    . TOOTH EXTRACTION Bilateral 03/05/2013   Procedure: EXTRACTION MOLARS;  Surgeon: Isac Caddy, DDS;  Location: [facility];  Service: Oral Surgery;  Laterality: Bilateral;     Current Outpatient  Medications:  .  acetaminophen (TYLENOL) 325 MG tablet, Take 650 mg by mouth every 6 (six) hours as needed for mild pain or headache. Reported on 10/27/2015, Disp: , Rfl:  .  albuterol (PROVENTIL HFA;VENTOLIN HFA) 108 (90 BASE) MCG/ACT inhaler, Inhale 2 puffs into the lungs every 6 (six) hours as needed. For breathing, Disp: , Rfl:  .  albuterol (PROVENTIL) (2.5 MG/3ML) 0.083% nebulizer solution, Take 2.5 mg by nebulization every 6 (six) hours as needed for wheezing or shortness of breath. Reported on 10/27/2015, Disp: , Rfl:  .  Ascorbic Acid (VITAMIN C) 1000 MG tablet, Take 1,000 mg by mouth 2 (two) times daily., Disp: , Rfl:  .  calcium-vitamin D (OSCAL WITH D) 500-200  MG-UNIT per tablet, Take 1 tablet by mouth 2 (two) times daily., Disp: , Rfl:  .  cetirizine (ZYRTEC) 10 MG tablet, Take 10 mg by mouth daily., Disp: , Rfl:  .  clobetasol ointment (TEMOVATE) 0.05 %, as needed., Disp: , Rfl: 4 .  COSENTYX 300 DOSE 150 MG/ML SOSY, 300 mg every 28 (twenty-eight) days. , Disp: , Rfl:  .  cyclobenzaprine (FLEXERIL) 10 MG tablet, Take 10 mg by mouth 2 (two) times daily as needed. For pain, Disp: , Rfl:  .  ferrous sulfate 325 (65 FE) MG tablet, Take 325 mg by mouth daily with breakfast., Disp: , Rfl:  .  furosemide (LASIX) 40 MG tablet, , Disp: , Rfl:  .  gabapentin (NEURONTIN) 100 MG capsule, Take 100 mg by mouth 3 (three) times daily. , Disp: , Rfl:  .  glucosamine-chondroitin 500-400 MG tablet, Take 1 tablet by mouth 2 (two) times daily., Disp: , Rfl:  .  HYDROcodone-acetaminophen (NORCO/VICODIN) 5-325 MG tablet, Take 1-2 tablets by mouth every 6 (six) hours as needed for moderate pain., Disp: 40 tablet, Rfl: 0 .  hydrOXYzine (ATARAX/VISTARIL) 10 MG tablet, Take 10 mg by mouth at bedtime as needed for itching or anxiety. Reported on 10/27/2015, Disp: , Rfl:  .  Levonorgestrel-Ethinyl Estradiol (AMETHIA) 0.15-0.03 &0.01 MG tablet, TAKE AS DIRECTED, Disp: 91 tablet, Rfl: 4 .  levothyroxine (SYNTHROID, LEVOTHROID) 75 MCG tablet, Take 75 mcg by mouth daily., Disp: , Rfl:  .  LORazepam (ATIVAN) 1 MG tablet, Take 1 mg by mouth at bedtime. , Disp: , Rfl:  .  mometasone (NASONEX) 50 MCG/ACT nasal spray, USE 2 SPRAYS IEN QD, Disp: , Rfl: 5 .  Multiple Vitamin (MULTIVITAMIN WITH MINERALS) TABS, Take 1 tablet by mouth daily., Disp: , Rfl:  .  pantoprazole (PROTONIX) 40 MG tablet, Take 40 mg by mouth daily. , Disp: , Rfl:  .  SYMBICORT 160-4.5 MCG/ACT inhaler, Inhale 2 puffs into the lungs 2 (two) times daily., Disp: , Rfl: 5 .  topiramate (TOPAMAX) 100 MG tablet, Take 100 mg by mouth at bedtime. , Disp: , Rfl:  .  VITAMIN E PO, Take 400 mg by mouth 2 (two) times daily. , Disp: ,  Rfl:   Current Facility-Administered Medications:  .  0.9 %  sodium chloride infusion, 500 mL, Intravenous, Continuous, Pyrtle, Lajuan Lines, MD Allergies  Allergen Reactions  . Amoxicillin Itching  . Latex Itching  . Percocet [Oxycodone-Acetaminophen] Itching  . Plaquenil [Hydroxychloroquine Sulfate]     "Psoriasis."  . Bactrim [Sulfamethoxazole-Trimethoprim] Rash  . Ciprofloxacin Hcl Rash  . Keflex [Cephalexin] Rash  . Penicillins Rash  . Tetracyclines & Related Rash    Social History   Tobacco Use  . Smoking status: Former Smoker    Packs/day: 0.25  Years: 8.00    Pack years: 2.00    Types: Cigarettes    Last attempt to quit: 02/28/2004    Years since quitting: 13.6  . Smokeless tobacco: Never Used  Substance Use Topics  . Alcohol use: Yes    Alcohol/week: 0.0 oz    Comment: ocasional wine    Family History  Problem Relation Age of Onset  . Lupus Brother   . Breast cancer Maternal Grandmother 2  . Hypertension Mother   . Diabetes Mother   . Hypertension Father   . Colon cancer Neg Hx   . Esophageal cancer Neg Hx   . Rectal cancer Neg Hx   . Stomach cancer Neg Hx       Review of Systems  Constitutional: negative for fatigue and weight loss Respiratory: negative for cough and wheezing Cardiovascular: negative for chest pain, fatigue and palpitations Gastrointestinal: negative for abdominal pain and change in bowel habits Musculoskeletal:negative for myalgias Neurological: negative for gait problems and tremors Behavioral/Psych: negative for abusive relationship, depression Endocrine: negative for temperature intolerance    Genitourinary:negative for abnormal menstrual periods, genital lesions, hot flashes, sexual problems and vaginal discharge Integument/breast: negative for breast lump, breast tenderness, nipple discharge and skin lesion(s)    Objective:       BP 111/79   Pulse 91   Wt 190 lb 14.4 oz (86.6 kg)   LMP 06/25/2016   BMI 30.81 kg/m   General:   alert  Skin:   no rash or abnormalities  Lungs:   clear to auscultation bilaterally  Heart:   regular rate and rhythm, S1, S2 normal, no murmur, click, rub or gallop  Breasts:   normal without suspicious masses, skin or nipple changes or axillary nodes  Abdomen:  normal findings: no organomegaly, soft, non-tender and no hernia  Pelvis:  External genitalia: normal general appearance Urinary system: urethral meatus normal and bladder without fullness, nontender Vaginal: normal without tenderness, induration or masses Cervix: normal appearance Adnexa: normal bimanual exam Uterus: anteverted and non-tender, normal size   Lab Review Urine pregnancy test Labs reviewed yes Radiologic studies reviewed yes  50% of 20 min visit spent on counseling and coordination of care.   Assessment:   1. Encounter for gynecological examination with Papanicolaou smear of cervix Rx: - Cytology - PAP - Cervicovaginal ancillary only  2. Postmenopausal - doing well  3. STD (female) Rx: - HIV antibody - Hepatitis B surface antigen - RPR - Hepatitis C antibody   Plan:    Education reviewed: calcium supplements, depression evaluation, low fat, low cholesterol diet, safe sex/STD prevention, self breast exams and weight bearing exercise. Follow up in: 1 year.   No orders of the defined types were placed in this encounter.  Orders Placed This Encounter  Procedures  . HIV antibody  . Hepatitis B surface antigen  . RPR  . Hepatitis C antibody    Shelly Bombard MD 10-31-2017

## 2017-11-01 ENCOUNTER — Telehealth: Payer: Self-pay

## 2017-11-01 LAB — HIV ANTIBODY (ROUTINE TESTING W REFLEX): HIV SCREEN 4TH GENERATION: NONREACTIVE

## 2017-11-01 LAB — HEPATITIS B SURFACE ANTIGEN: HEP B S AG: NEGATIVE

## 2017-11-01 LAB — CERVICOVAGINAL ANCILLARY ONLY
Bacterial vaginitis: NEGATIVE
CANDIDA VAGINITIS: POSITIVE — AB
Chlamydia: NEGATIVE
Neisseria Gonorrhea: NEGATIVE
TRICH (WINDOWPATH): NEGATIVE

## 2017-11-01 LAB — CYTOLOGY - PAP
DIAGNOSIS: NEGATIVE
HPV: NOT DETECTED

## 2017-11-01 LAB — RPR: RPR Ser Ql: NONREACTIVE

## 2017-11-01 LAB — HEPATITIS C ANTIBODY: Hep C Virus Ab: 0.2 s/co ratio (ref 0.0–0.9)

## 2017-11-01 NOTE — Telephone Encounter (Signed)
Pt called wanting to know if she needs to continue her birth control pills. Pt came in for her annual yesterday.

## 2017-11-02 ENCOUNTER — Other Ambulatory Visit: Payer: Self-pay | Admitting: Obstetrics

## 2017-11-02 DIAGNOSIS — B3731 Acute candidiasis of vulva and vagina: Secondary | ICD-10-CM

## 2017-11-02 DIAGNOSIS — B373 Candidiasis of vulva and vagina: Secondary | ICD-10-CM

## 2017-11-02 MED ORDER — FLUCONAZOLE 150 MG PO TABS: 150.0000 mg | ORAL_TABLET | Freq: Once | ORAL | 0 refills | Status: AC

## 2017-11-02 NOTE — Telephone Encounter (Signed)
She may discontinue OCP's

## 2017-11-02 NOTE — Telephone Encounter (Signed)
Pt informed

## 2017-11-10 ENCOUNTER — Telehealth: Payer: Self-pay

## 2017-11-10 NOTE — Telephone Encounter (Signed)
TC to pt regarding message pt states she has been experiencing constant bleeding since stopping OCP's  Pt made aware that can be a side effect sue to her body adjusting. Pt cannot take IBP. She also c/o headaches  Pt states at visit side effects were not discussed .  Please advise.

## 2017-11-11 ENCOUNTER — Other Ambulatory Visit: Payer: Self-pay | Admitting: Obstetrics

## 2017-11-11 DIAGNOSIS — G4459 Other complicated headache syndrome: Secondary | ICD-10-CM

## 2017-11-11 DIAGNOSIS — N939 Abnormal uterine and vaginal bleeding, unspecified: Secondary | ICD-10-CM

## 2017-11-11 MED ORDER — NORETHINDRONE ACETATE 5 MG PO TABS: 10.0000 mg | ORAL_TABLET | Freq: Every day | ORAL | 0 refills | Status: DC

## 2017-11-11 NOTE — Telephone Encounter (Signed)
Pt made aware Rx was sent

## 2017-11-11 NOTE — Telephone Encounter (Signed)
Norethindrone Rx for AUB.  Tylenol recommended for HA.

## 2017-11-24 ENCOUNTER — Ambulatory Visit
Admission: RE | Admit: 2017-11-24 | Discharge: 2017-11-24 | Disposition: A | Discharge status: Home / Self Care | Payer: Medicare Other | Source: Ambulatory Visit | Attending: Internal Medicine | Admitting: Internal Medicine

## 2017-11-24 DIAGNOSIS — Z1231 Encounter for screening mammogram for malignant neoplasm of breast: Secondary | ICD-10-CM

## 2017-11-25 ENCOUNTER — Ambulatory Visit: Payer: Medicare Other

## 2017-12-19 ENCOUNTER — Other Ambulatory Visit: Payer: Self-pay | Admitting: Obstetrics

## 2017-12-19 DIAGNOSIS — N939 Abnormal uterine and vaginal bleeding, unspecified: Secondary | ICD-10-CM

## 2017-12-19 NOTE — Telephone Encounter (Signed)
Please review for refill.  

## 2018-01-24 ENCOUNTER — Telehealth: Payer: Self-pay | Admitting: Rheumatology

## 2018-01-24 NOTE — Telephone Encounter (Signed)
Lab result has been faxed.  °

## 2018-01-24 NOTE — Telephone Encounter (Signed)
Olin Hauser from Dr. Sharol Roussel at [facility] in [facility] called regarding a mutual patient.   They are requesting a copy of patient's TB Test results.  Please fax the results to  [phone]  Attn:  Olin Hauser  If you have any questions, please call # [phone] opt 4

## 2018-02-27 ENCOUNTER — Emergency Department (HOSPITAL_COMMUNITY): Payer: Medicare Other

## 2018-02-27 ENCOUNTER — Other Ambulatory Visit: Payer: Self-pay

## 2018-02-27 ENCOUNTER — Encounter (HOSPITAL_COMMUNITY): Payer: Self-pay | Admitting: *Deleted

## 2018-02-27 ENCOUNTER — Emergency Department (HOSPITAL_COMMUNITY)
Admission: EM | Admit: 2018-02-27 | Discharge: 2018-02-27 | Disposition: A | Discharge status: Home / Self Care | Payer: Medicare Other | Attending: Emergency Medicine | Admitting: Emergency Medicine

## 2018-02-27 DIAGNOSIS — J45901 Unspecified asthma with (acute) exacerbation: Secondary | ICD-10-CM

## 2018-02-27 DIAGNOSIS — Z87891 Personal history of nicotine dependence: Secondary | ICD-10-CM | POA: Diagnosis not present

## 2018-02-27 DIAGNOSIS — Z79899 Other long term (current) drug therapy: Secondary | ICD-10-CM | POA: Insufficient documentation

## 2018-02-27 DIAGNOSIS — J4521 Mild intermittent asthma with (acute) exacerbation: Secondary | ICD-10-CM | POA: Diagnosis not present

## 2018-02-27 DIAGNOSIS — E119 Type 2 diabetes mellitus without complications: Secondary | ICD-10-CM | POA: Diagnosis not present

## 2018-02-27 DIAGNOSIS — R0602 Shortness of breath: Secondary | ICD-10-CM | POA: Diagnosis present

## 2018-02-27 DIAGNOSIS — Z91040 Latex allergy status: Secondary | ICD-10-CM | POA: Diagnosis not present

## 2018-02-27 DIAGNOSIS — E039 Hypothyroidism, unspecified: Secondary | ICD-10-CM | POA: Insufficient documentation

## 2018-02-27 LAB — BASIC METABOLIC PANEL
Anion gap: 7 (ref 5–15)
BUN: 10 mg/dL (ref 6–20)
CO2: 27 mmol/L (ref 22–32)
Calcium: 9.5 mg/dL (ref 8.9–10.3)
Chloride: 110 mmol/L (ref 98–111)
Creatinine, Ser: 1.04 mg/dL — ABNORMAL HIGH (ref 0.44–1.00)
GFR calc Af Amer: >60 mL/min (ref >60)
GLUCOSE: 96 mg/dL (ref 70–99)
Potassium: 3.5 mmol/L (ref 3.5–5.1)
Sodium: 144 mmol/L (ref 135–145)

## 2018-02-27 LAB — I-STAT TROPONIN, ED
Troponin i, poc: 0.02 ng/mL (ref 0.00–0.08)
Troponin i, poc: 0 ng/mL (ref 0.00–0.08)

## 2018-02-27 LAB — CBC
HCT: 36.6 % (ref 36.0–46.0)
Hemoglobin: 12.1 g/dL (ref 12.0–15.0)
MCH: 30.2 pg (ref 26.0–34.0)
MCHC: 33.1 g/dL (ref 30.0–36.0)
MCV: 91.3 fL (ref 78.0–100.0)
Platelets: 276 10*3/uL (ref 150–400)
RBC: 4.01 MIL/uL (ref 3.87–5.11)
RDW: 12.5 % (ref 11.5–15.5)
WBC: 5.7 10*3/uL (ref 4.0–10.5)

## 2018-02-27 LAB — I-STAT BETA HCG BLOOD, ED (MC, WL, AP ONLY): I-stat hCG, quantitative: <5 m[IU]/mL (ref <5)

## 2018-02-27 MED ORDER — ACETAMINOPHEN 325 MG PO TABS
650.0000 mg | ORAL_TABLET | Freq: Once | ORAL | Status: AC
  Administered 2018-02-27: 650 mg via ORAL
  Filled 2018-02-27: qty 2

## 2018-02-27 NOTE — ED Triage Notes (Signed)
Pt arrived by [facility], reports sudden onset of bronchitis and sob. Reports this led to migraine, hx of same. Sob has improved. No resp distress is noted at triage.

## 2018-02-27 NOTE — ED Provider Notes (Signed)
[facility] EMERGENCY DEPARTMENT Provider Note   CSN: [id number] Arrival date & time: 02/27/18  1314   History   Chief Complaint Chief Complaint  Patient presents with  . Shortness of Breath  . Migraine    HPI [name] is a 52 y.o. female.   HPI   52 year old female with a significant past medical history of bronchitis presents today with complaints of shortness of breath.  Patient notes that this morning she felt as if she could not catch her breath, she notes this was identical to previous episodes of bronchitis.  She notes using an inhaler without immediate improvement in symptoms.  She called EMS at this point.  Patient notes by the time she arrived in the emergency room she is asymptomatic.  She denies any chest pain or shortness of breath.  She notes she was having tightness at the time of symptoms.  She denies any known exacerbating exposures.  She notes a history of migraines, she notes that the episode triggered a migraine, her right frontal with no neurological deficits, typical of previous.    Past Medical History:  Diagnosis Date  . Allergy   . Anxiety   . Arthritis    ra, lupus - ankles, knees, hands, neck  . Blood transfusion without reported diagnosis [place] with c/s surgery  . Bronchitis    Hx  . Cancer (HCC)    nasopharynx  . COPD (chronic obstructive pulmonary disease) ([place])   . DDD (degenerative disc disease), lumbar   . Diabetes mellitus without complication ([place])    related to prednisone use, no meds,cbg normal  . Diverticulosis   . Excessive or frequent menstruation   . GERD (gastroesophageal reflux disease)   . Headache(784.0)   . Hypothyroidism   . Irregular menstrual cycle   . Leiomyoma of uterus, unspecified   . Lupus ([place])   . Neuromuscular disorder (HCC)    neuropathy in legs/feet  . Neuropathy   . Papanicolaou smear of cervix with atypical squamous cells of undetermined significance (ASC-US)   .  Psoriasis   . Sinusitis   . Trichimoniasis   . Wears partial dentures    full upper and lower partial    Patient Active Problem List   Diagnosis Date Noted  . 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 History of asthma 01/10/2017  . Psoriatic arthritis ([place]) 08/10/2016  . High risk medication use 08/10/2016  . Chronic pain syndrome 08/22/2013  . Pulmonary infiltrates 06/30/2013  . Psoriasis   . GERD (gastroesophageal reflux disease)   . Hypothyroidism   . Pleurisy 06/19/2013  . Caries 03/04/2013  . Nephrolithiasis 04/05/2012  . Hydronephrosis 04/05/2012  . UTI (lower urinary tract infection) 04/05/2012  . On prednisone therapy 04/05/2012  . Positive ANA (antinuclear antibody) 04/05/2012    Past Surgical History:  Procedure Laterality Date  . ANKLE ARTHROSCOPY Right   . CESAREAN SECTION     x 1  . DILATION AND CURETTAGE OF UTERUS     x 1 Missed Abortion  . LYMPH NODE BIOPSY     neck  . MULTIPLE EXTRACTIONS WITH ALVEOLOPLASTY N/A 03/05/2013   Procedure: MULTIPLE EXTRACION WITH ALVEOLOPLASTY, extraction of decayed teeth numbers 3,4,5,18,19,22,23,24,25, extraction of retained root tips teeth numbers 2,20,21,26, bilateral mandibular alveoloplasty and upper right maxillary alveoloplasty;  Surgeon: Isac Caddy, DDS;  Location: [facility];  Service: Oral Surgery;  Laterality: N/A;  . scalp biopsy    . TOOTH EXTRACTION Bilateral 03/05/2013  Procedure: EXTRACTION MOLARS;  Surgeon: Isac Caddy, DDS;  Location: [facility];  Service: Oral Surgery;  Laterality: Bilateral;     OB History    Gravida  2   Para  1   Term      Preterm  1   AB  1   Living  1     SAB  1   TAB      Ectopic      Multiple      Live Births  1            Home Medications    Prior to Admission medications   Medication Sig Start Date End Date Taking? Authorizing Provider  acetaminophen (TYLENOL) 325 MG tablet Take 650 mg by mouth every 6 (six) hours as needed for mild pain or  headache. Reported on 10/27/2015    [provider]  albuterol (PROVENTIL HFA;VENTOLIN HFA) 108 (90 BASE) MCG/ACT inhaler Inhale 2 puffs into the lungs every 6 (six) hours as needed. For breathing    [provider]  albuterol (PROVENTIL) (2.5 MG/3ML) 0.083% nebulizer solution Take 2.5 mg by nebulization every 6 (six) hours as needed for wheezing or shortness of breath. Reported on 10/27/2015    [provider]  Ascorbic Acid (VITAMIN C) 1000 MG tablet Take 1,000 mg by mouth 2 (two) times daily.    [provider]  calcium-vitamin D (OSCAL WITH D) 500-200 MG-UNIT per tablet Take 1 tablet by mouth 2 (two) times daily.    [provider]  cetirizine (ZYRTEC) 10 MG tablet Take 10 mg by mouth daily.    [provider]  clobetasol ointment (TEMOVATE) 0.05 % as needed. 04/18/17   [provider]  COSENTYX 300 DOSE 150 MG/ML SOSY 300 mg every 28 (twenty-eight) days.  10/27/16   [provider]  cyclobenzaprine (FLEXERIL) 10 MG tablet Take 10 mg by mouth 2 (two) times daily as needed. For pain    [provider]  ferrous sulfate 325 (65 FE) MG tablet Take 325 mg by mouth daily with breakfast.    [provider]  furosemide (LASIX) 40 MG tablet  06/18/16   [provider]  gabapentin (NEURONTIN) 100 MG capsule Take 100 mg by mouth 3 (three) times daily.     [provider]  glucosamine-chondroitin 500-400 MG tablet Take 1 tablet by mouth 2 (two) times daily.    [provider]  HYDROcodone-acetaminophen (NORCO/VICODIN) 5-325 MG tablet Take 1-2 tablets by mouth every 6 (six) hours as needed for moderate pain. 12/10/15   Shelly Bombard, MD  hydrOXYzine (ATARAX/VISTARIL) 10 MG tablet Take 10 mg by mouth at bedtime as needed for itching or anxiety. Reported on 10/27/2015    [provider]  Levonorgestrel-Ethinyl Estradiol (AMETHIA) 0.15-0.03 &0.01 MG tablet TAKE AS DIRECTED 12/10/15   Shelly Bombard, MD  levothyroxine (SYNTHROID, LEVOTHROID) 75 MCG tablet Take 75 mcg by mouth daily.    [provider]  LORazepam (ATIVAN) 1 MG tablet Take 1 mg by mouth at bedtime.     [provider]  mometasone (NASONEX) 50 MCG/ACT nasal spray USE 2 SPRAYS IEN QD 07/18/16   [provider]  Multiple Vitamin (MULTIVITAMIN WITH MINERALS) TABS Take 1 tablet by mouth daily.    [provider]  norethindrone (AYGESTIN) 5 MG tablet Take 2 tablets (10 mg total) by mouth daily. 11/11/17   Shelly Bombard, MD  pantoprazole (PROTONIX) 40 MG tablet Take 40 mg by  mouth daily.     [provider]  SYMBICORT 160-4.5 MCG/ACT inhaler Inhale 2 puffs into the lungs 2 (two) times daily. 05/02/14   [provider]  topiramate (TOPAMAX) 100 MG tablet Take 100 mg by mouth at bedtime.     [provider]  VITAMIN E PO Take 400 mg by mouth 2 (two) times daily.     [provider]    Family History Family History  Problem Relation Age of Onset  . Lupus Brother   . Breast cancer Maternal Grandmother 29  . Hypertension Mother   . Diabetes Mother   . Hypertension Father   . Colon cancer Neg Hx   . Esophageal cancer Neg Hx   . Rectal cancer Neg Hx   . Stomach cancer Neg Hx     Social History Social History   Tobacco Use  . Smoking status: Former Smoker    Packs/day: 0.25    Years: 8.00    Pack years: 2.00    Types: Cigarettes    Last attempt to quit: 02/28/2004    Years since quitting: 14.0  . Smokeless tobacco: Never Used  Substance Use Topics  . Alcohol use: Yes    Alcohol/week: 0.0 standard drinks    Comment: ocasional wine  . Drug use: No     Allergies   Amoxicillin; Latex; Percocet [oxycodone-acetaminophen]; Plaquenil [hydroxychloroquine sulfate]; Bactrim [sulfamethoxazole-trimethoprim]; Ciprofloxacin hcl; Keflex [cephalexin]; Penicillins; and Tetracyclines & related   Review of Systems Review of Systems  All other  systems reviewed and are negative.   Physical Exam Updated Vital Signs BP (!) 149/84   Pulse 79   Temp 98.5 F (36.9 C) (Oral)   Resp (!) 25   Ht 5\' 6"  (1.676 m)   Wt 78.9 kg   LMP 06/25/2016   SpO2 100%   BMI 28.08 kg/m   Physical Exam  Constitutional: She is oriented to person, place, and time. She appears well-developed and well-nourished.  HENT:  Head: Normocephalic and atraumatic.  Eyes: Pupils are equal, round, and reactive to light. Conjunctivae are normal. Right eye exhibits no discharge. Left eye exhibits no discharge. No scleral icterus.  Neck: Normal range of motion. No JVD present. No tracheal deviation present.  Cardiovascular: Normal rate, regular rhythm, normal heart sounds and intact distal pulses. Exam reveals no gallop and no friction rub.  No murmur heard. Pulmonary/Chest: Effort normal and breath sounds normal. No stridor. No respiratory distress. She has no wheezes. She has no rales. She exhibits no tenderness.  Musculoskeletal: She exhibits no edema.  Neurological: She is alert and oriented to person, place, and time. Coordination normal.  Psychiatric: She has a normal mood and affect. Her behavior is normal. Judgment and thought content normal.  Nursing note and vitals reviewed.   ED Treatments / Results  Labs (all labs ordered are listed, but only abnormal results are displayed) Labs Reviewed  BASIC METABOLIC PANEL - Abnormal; Notable for the following components:      Result Value   Creatinine, Ser 1.04 (*)    All other components within normal limits  CBC  I-STAT TROPONIN, ED  I-STAT BETA HCG BLOOD, ED ([facility], [facility], [facility] ONLY)  I-STAT TROPONIN, ED    EKG None  Radiology Dg Chest 2 View  Result Date: 02/27/2018 CLINICAL DATA:  Shortness of breath EXAM: CHEST - 2 VIEW COMPARISON:  04/24/2014, CT chest 08/26/2014 FINDINGS: The heart size and mediastinal contours are within normal limits. Both lungs are clear. Scoliosis of the  spine. IMPRESSION: No  active cardiopulmonary disease. Electronically Signed   By: Donavan Foil M.D.   On: 02/27/2018 15:47    Procedures Procedures (including critical care time)  Medications Ordered in ED Medications  acetaminophen (TYLENOL) tablet 650 mg (650 mg Oral Given 02/27/18 1853)     Initial Impression / Assessment and Plan / ED Course  I have reviewed the triage vital signs and the nursing notes.  Pertinent labs & imaging results that were available during my care of the patient were reviewed by me and considered in my medical decision making (see chart for details).     Labs: I stat trop, I stat beta hcg, bmp, cbc  Imaging: Dg chest/ EDEKG  Consults:  Therapeutics: Tylenol  Discharge Meds:   Assessment/Plan: Pt presents with complaints of SOB. Typical of asthma, asymptomatic at the time of my evaluation., no associated chest pain. No history of DVT or PE, no signs or symptoms here.  Patient stable for outpatient follow-up, she is given strict return precautions, she verbalized understanding and agreement to today's plan had no further questions or concerns.   Final Clinical Impressions(s) / ED Diagnoses   Final diagnoses:  Mild asthma with exacerbation, unspecified whether persistent    ED Discharge Orders    None       Francee Gentile 02/27/18 1942    Tegeler, Gwenyth Allegra, MD 02/27/18 [phone]

## 2018-02-27 NOTE — Discharge Instructions (Addendum)
Please read attached information. If you experience any new or worsening signs or symptoms please return to the emergency room for evaluation. Please follow-up with your primary care provider or specialist as discussed.  °

## 2018-03-13 NOTE — Progress Notes (Signed)
Office Visit Note  Patient: [name]             Date of Birth: Mar 01, 1966           MRN: [id number]             PCP: Nolene Ebbs, MD Referring: Nolene Ebbs, MD Visit Date: 03/24/2018 Occupation: @GUAROCC @  Subjective:  Medication monitoring   History of Present Illness: [name] is a 52 y.o. female with history of psoriatic arthritis.  She is on Cosentyx 300 mg sq once a month.  She has missed 2 doses of Cosentyx due to recently being sick.  She states about 1 month ago she had an acute exacerbation of bronchitis and she was evaluated in the ED.  She reports she was started on antibiotics and prednisone.  She has been off prednisone for about 1 week.  She states she continues to have congestion but her symptoms are improving.  She denies any recent fevers. She denies any recent psoriatic arthritis flares.  She denies any joint pain or joint swelling.  She reports that her psoriasis is clearing.  She denies any plantar fasciitis or Achilles tendinitis.  She occasionally has SI joint pain and right-sided sciatica.  She takes Tylenol for pain relief.  She states that her PCP ordered a DEXA scan which is scheduled for 04/20/2018.  Activities of Daily Living:  Patient reports morning stiffness for 0 none.   Patient Denies nocturnal pain.  Difficulty dressing/grooming: Denies Difficulty climbing stairs: Denies Difficulty getting out of chair: Denies Difficulty using hands for taps, buttons, cutlery, and/or writing: Denies  Review of Systems  Constitutional: Positive for fatigue.  HENT: Negative for mouth sores, mouth dryness and nose dryness.   Eyes: Negative for pain, visual disturbance and dryness.  Respiratory: Negative for cough, hemoptysis, shortness of breath and difficulty breathing.   Cardiovascular: Negative for chest pain, palpitations, hypertension and swelling in legs/feet.  Gastrointestinal: Negative for blood in stool, constipation and diarrhea.    Endocrine: Negative for increased urination.  Genitourinary: Negative for difficulty urinating and painful urination.  Musculoskeletal: Negative for arthralgias, joint pain, joint swelling, myalgias, muscle weakness, morning stiffness, muscle tenderness and myalgias.  Skin: Negative for color change, pallor, rash, hair loss, nodules/bumps, skin tightness, ulcers and sensitivity to sunlight.  Allergic/Immunologic: Negative for susceptible to infections.  Neurological: Positive for light-headedness. Negative for numbness, headaches and weakness.  Hematological: Negative for bruising/bleeding tendency and swollen glands.  Psychiatric/Behavioral: Negative for depressed mood and sleep disturbance. The patient is not nervous/anxious.     PMFS History:  Patient Active Problem List   Diagnosis Date Noted  . 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 History of asthma 01/10/2017  . Psoriatic arthritis ([place]) 08/10/2016  . High risk medication use 08/10/2016  . Chronic pain syndrome 08/22/2013  . Pulmonary infiltrates 06/30/2013  . Psoriasis   . GERD (gastroesophageal reflux disease)   . Hypothyroidism   . Pleurisy 06/19/2013  . Caries 03/04/2013  . Nephrolithiasis 04/05/2012  . Hydronephrosis 04/05/2012  . UTI (lower urinary tract infection) 04/05/2012  . On prednisone therapy 04/05/2012  . Positive ANA (antinuclear antibody) 04/05/2012    Past Medical History:  Diagnosis Date  . Allergy   . Anxiety   . Arthritis    ra, lupus - ankles, knees, hands, neck  . Blood transfusion without reported diagnosis [place] with c/s surgery  . Bronchitis    Hx  . Cancer ([place])  nasopharynx  . COPD (chronic obstructive pulmonary disease) ([place])   . DDD (degenerative disc disease), lumbar   . Diabetes mellitus without complication ([place])    related to prednisone use, no meds,cbg normal  . Diverticulosis   . Excessive or frequent menstruation   . GERD (gastroesophageal reflux disease)    . Headache(784.0)   . Hypothyroidism   . Irregular menstrual cycle   . Leiomyoma of uterus, unspecified   . Lupus ([place])   . Migraines   . Neuromuscular disorder (HCC)    neuropathy in legs/feet  . Neuropathy   . Papanicolaou smear of cervix with atypical squamous cells of undetermined significance (ASC-US)   . Psoriasis   . Sinusitis   . Trichimoniasis   . Wears partial dentures    full upper and lower partial    Family History  Problem Relation Age of Onset  . Lupus Brother   . Breast cancer Maternal Grandmother 105  . Hypertension Mother   . Diabetes Mother   . Hypertension Father   . Colon cancer Neg Hx   . Esophageal cancer Neg Hx   . Rectal cancer Neg Hx   . Stomach cancer Neg Hx    Past Surgical History:  Procedure Laterality Date  . ANKLE ARTHROSCOPY Right   . CESAREAN SECTION     x 1  . DILATION AND CURETTAGE OF UTERUS     x 1 Missed Abortion  . LYMPH NODE BIOPSY     neck  . MULTIPLE EXTRACTIONS WITH ALVEOLOPLASTY N/A 03/05/2013   Procedure: MULTIPLE EXTRACION WITH ALVEOLOPLASTY, extraction of decayed teeth numbers 3,4,5,18,19,22,23,24,25, extraction of retained root tips teeth numbers 2,20,21,26, bilateral mandibular alveoloplasty and upper right maxillary alveoloplasty;  Surgeon: Isac Caddy, DDS;  Location: [facility];  Service: Oral Surgery;  Laterality: N/A;  . scalp biopsy    . TOOTH EXTRACTION Bilateral 03/05/2013   Procedure: EXTRACTION MOLARS;  Surgeon: Isac Caddy, DDS;  Location: [facility];  Service: Oral Surgery;  Laterality: Bilateral;   Social History   Social History Narrative  . Not on file    Objective: Vital Signs: BP (!) 142/85 (BP Location: Left Arm, Patient Position: Sitting, Cuff Size: Normal)   Pulse 97   Resp 16   Ht 5\' 6"  (1.676 m)   Wt 174 lb 0.3 oz (78.9 kg)   LMP 06/25/2016   BMI 28.09 kg/m    Physical Exam  Constitutional: She is oriented to person, place, and time. She appears well-developed and well-nourished.   HENT:  Head: Normocephalic and atraumatic.  Eyes: Conjunctivae and EOM are normal.  Neck: Normal range of motion.  Cardiovascular: Normal rate, regular rhythm, normal heart sounds and intact distal pulses.  Pulmonary/Chest: Effort normal and breath sounds normal.  Abdominal: Soft. Bowel sounds are normal.  Lymphadenopathy:    She has no cervical adenopathy.  Neurological: She is alert and oriented to person, place, and time.  Skin: Skin is warm and dry. Capillary refill takes less than 2 seconds.  Psychiatric: She has a normal mood and affect. Her behavior is normal.  Nursing note and vitals reviewed.    Musculoskeletal Exam: C-spine, thoracic spine, and lumbar spine good ROM. No midline spinal tenderness. Mild SI joint tenderness.  Shoulder joints, elbow joints, wrist joints, MCPs, PIPs, DIPs good range of motion no synovitis.  She has edema in bilateral hands.  No joint tenderness or synovitis was noted.  She is complete fist formation bilaterally.  Hip joints, knee joints, ankle joints, MTPs,  PIPs and DIPs good range of motion no synovitis.  No warmth or effusion of bilateral knee joint. No tenderness or swelling of ankle joints.   CDAI Exam: CDAI Score: 0.2  Patient Global Assessment: 1 (mm); Provider Global Assessment: 1 (mm) Swollen: 0 ; Tender: 0  Joint Exam   Not documented   There is currently no information documented on the homunculus. Go to the Rheumatology activity and complete the homunculus joint exam.  Investigation: No additional findings.  Imaging: Dg Chest 2 View  Result Date: 02/27/2018 CLINICAL DATA:  Shortness of breath EXAM: CHEST - 2 VIEW COMPARISON:  04/24/2014, CT chest 08/26/2014 FINDINGS: The heart size and mediastinal contours are within normal limits. Both lungs are clear. Scoliosis of the spine. IMPRESSION: No active cardiopulmonary disease. Electronically Signed   By: Donavan Foil M.D.   On: 02/27/2018 15:47    Recent Labs: Lab Results   Component Value Date   WBC 5.7 02/27/2018   HGB 12.1 02/27/2018   PLT 276 02/27/2018   NA 144 02/27/2018   K 3.5 02/27/2018   CL 110 02/27/2018   CO2 27 02/27/2018   GLUCOSE 96 02/27/2018   BUN 10 02/27/2018   CREATININE 1.04 (H) 02/27/2018   BILITOT 0.3 10/21/2017   ALKPHOS 60 01/12/2017   AST 14 10/21/2017   ALT 10 10/21/2017   PROT 6.9 10/21/2017   ALBUMIN 3.9 01/12/2017   CALCIUM 9.5 02/27/2018   GFRAA >60 02/27/2018   QFTBGOLDPLUS NEGATIVE 10/21/2017    Speciality Comments: No specialty comments available.  Procedures:  No procedures performed Allergies: Amoxicillin; Latex; Percocet [oxycodone-acetaminophen]; Plaquenil [hydroxychloroquine sulfate]; Bactrim [sulfamethoxazole-trimethoprim]; Ciprofloxacin hcl; Keflex [cephalexin]; Penicillins; and Tetracyclines & related   Assessment / Plan:     Visit Diagnoses: Psoriatic arthritis ([place]) - She has no synovitis or dactylitis on exam. she has no joint pain at this time. She has no achilles tendonitis or plantar fasciitis.  She has no SI joint tenderness on exam today.  She has occasional right sided sciatica.  Her psoriasis continues to clear.  She has missed 2 doses of Cosentyx due to recent acute exacerbation of bronchitis.  She was recently on antibiotics and prednisone. She will restart on Cosentyx 300 mg sq injections once monthly once her infections have cleared. She continues to follow up with Dr. Sharol Roussel regularly.  She was advised to notify us if she develops increased joint pain and joint swelling.  She will follow up in 5 months.   Psoriasis: Her psoriasis continues to further clear.  High risk medication use - Dr. Sharol Roussel prescribes - Cosentyx 300 mg sq q month. CBC and BMP were checked on 02/27/18.  She was given a copy of negative TB gold on 10/21/17.   Positive ANA (antinuclear antibody) - a titer of 1:40 and nucleolar pattern negative ENA  negative CCP.  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  No orders of the defined types were placed in this encounter.    Follow-Up Instructions: Return in about 5 months (around 08/23/2018) for Psoriatic arthritis.   Savannah Neas, PA-C   I examined and evaluated the patient with Hazel Sams PA.  Patient had some synovial thickening  but no synovitis on examination.  Her psoriasis appears to be well controlled.  She has been off Cosentyx for the last 2 months due to sinus infection and bronchitis.  Her labs have been stable.  The plan of care was discussed as noted above.  Bo Merino, MD  Note - This record has been created using Editor, commissioning.  Chart creation errors have been sought, but may not always  have been located. Such creation errors do not reflect on  the standard of medical care.

## 2018-03-15 ENCOUNTER — Other Ambulatory Visit: Payer: Self-pay | Admitting: Internal Medicine

## 2018-03-15 DIAGNOSIS — E2839 Other primary ovarian failure: Secondary | ICD-10-CM

## 2018-03-22 ENCOUNTER — Telehealth: Payer: Self-pay | Admitting: Rheumatology

## 2018-03-22 NOTE — Telephone Encounter (Signed)
Patient would like to know when she had her last TB draw done. Please call patient to advise.

## 2018-03-23 NOTE — Telephone Encounter (Signed)
Spoke with patient and advised her last TB Gold result was drawn May 2019. Patient states her dermatologist needs the results for her to continue her Cosentyx. Patient is requesting to have the results faxed. Faxed  Results while on the phone with patient. Patient also advised since the dermatologist is with [facility] they may access the results through [facility]. Patient verbalized understanding.

## 2018-03-24 ENCOUNTER — Encounter: Payer: Self-pay | Admitting: Rheumatology

## 2018-03-24 ENCOUNTER — Ambulatory Visit (INDEPENDENT_AMBULATORY_CARE_PROVIDER_SITE_OTHER): Payer: Medicare Other | Admitting: Rheumatology

## 2018-03-24 VITALS — BP 142/85 | HR 97 | Resp 16 | Ht 66.0 in | Wt 174.0 lb

## 2018-03-24 DIAGNOSIS — L409 Psoriasis, unspecified: Secondary | ICD-10-CM | POA: Diagnosis not present

## 2018-03-24 DIAGNOSIS — L4050 Arthropathic psoriasis, unspecified: Secondary | ICD-10-CM

## 2018-03-24 DIAGNOSIS — Z79899 Other long term (current) drug therapy: Secondary | ICD-10-CM | POA: Diagnosis not present

## 2018-03-24 DIAGNOSIS — R768 Other specified abnormal immunological findings in serum: Secondary | ICD-10-CM

## 2018-03-24 DIAGNOSIS — N200 Calculus of kidney: Secondary | ICD-10-CM

## 2018-03-24 DIAGNOSIS — Z8719 Personal history of other diseases of the digestive system: Secondary | ICD-10-CM

## 2018-03-24 DIAGNOSIS — Z8639 Personal history of other endocrine, nutritional and metabolic disease: Secondary | ICD-10-CM

## 2018-03-24 DIAGNOSIS — Z8659 Personal history of other mental and behavioral disorders: Secondary | ICD-10-CM

## 2018-03-24 DIAGNOSIS — Z8709 Personal history of other diseases of the respiratory system: Secondary | ICD-10-CM

## 2018-03-24 DIAGNOSIS — C119 Malignant neoplasm of nasopharynx, unspecified: Secondary | ICD-10-CM

## 2018-03-24 NOTE — Patient Instructions (Signed)
Standing Labs We placed an order today for your standing lab work.    Please come back and get your standing labs in December and every 3 months   We have open lab Monday through Friday from 8:30-11:30 AM and 1:30-4:00 PM  at the office of Dr. Shaili Deveshwar.   You may experience shorter wait times on Monday and Friday afternoons. The office is located at [address], [address], [place], [place] [address] No appointment is necessary.   Labs are drawn by [facility].  You may receive a bill from [facility] for your lab work. If you have any questions regarding directions or hours of operation,  please call [phone].   Just as a reminder please drink plenty of water prior to coming for your lab work. Thanks!   

## 2018-04-19 ENCOUNTER — Other Ambulatory Visit: Payer: Medicare Other

## 2018-04-19 ENCOUNTER — Ambulatory Visit
Admission: RE | Admit: 2018-04-19 | Discharge: 2018-04-19 | Disposition: A | Discharge status: Home / Self Care | Payer: Medicare Other | Source: Ambulatory Visit | Attending: Internal Medicine | Admitting: Internal Medicine

## 2018-04-19 DIAGNOSIS — E2839 Other primary ovarian failure: Secondary | ICD-10-CM

## 2018-05-10 ENCOUNTER — Other Ambulatory Visit: Payer: Medicare Other

## 2018-06-16 NOTE — Progress Notes (Deleted)
Office Visit Note  Patient: [name]             Date of Birth: [birth year]           MRN: [id number]             PCP: Nolene Ebbs, MD Referring: Nolene Ebbs, MD Visit Date: 06/29/2018 Occupation: @GUAROCC @  Subjective:  No chief complaint on file.  Cosentyx.  Last TB gold negative on 10/21/2017.  Most recent CBC and BMP within normal limits on 02/27/2018.  Last CMP within normal limits on 10/21/2017.  Due for CBC/CMP today and will monitor every 3 months.  Standing orders in place. Recommend annual influenza, Pneumovax 23, Prevnar 13, and Shingrix as indicated.  History of Present Illness: [name] is a 51 y.o. female ***   Activities of Daily Living:  Patient reports morning stiffness for *** {minute/hour:19697}.   Patient {ACTIONS;DENIES/REPORTS:21021675::"Denies"} nocturnal pain.  Difficulty dressing/grooming: {ACTIONS;DENIES/REPORTS:21021675::"Denies"} Difficulty climbing stairs: {ACTIONS;DENIES/REPORTS:21021675::"Denies"} Difficulty getting out of chair: {ACTIONS;DENIES/REPORTS:21021675::"Denies"} Difficulty using hands for taps, buttons, cutlery, and/or writing: {ACTIONS;DENIES/REPORTS:21021675::"Denies"}  No Rheumatology ROS completed.   PMFS History:  Patient Active Problem List   Diagnosis Date Noted  . 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 History of asthma 01/10/2017  . Psoriatic arthritis ([place]) 08/10/2016  . High risk medication use 08/10/2016  . Chronic pain syndrome 08/22/2013  . Pulmonary infiltrates 06/30/2013  . Psoriasis   . GERD (gastroesophageal reflux disease)   . Hypothyroidism   . Pleurisy 06/19/2013  . Caries 03/04/2013  . Nephrolithiasis 04/05/2012  . Hydronephrosis 04/05/2012  . UTI (lower urinary tract infection) 04/05/2012  . On prednisone therapy 04/05/2012  . Positive ANA (antinuclear antibody) 04/05/2012    Past Medical History:  Diagnosis Date  . Allergy   . Anxiety   . Arthritis    ra, lupus -  ankles, knees, hands, neck  . Blood transfusion without reported diagnosis [place] with c/s surgery  . Bronchitis    Hx  . Cancer (HCC)    nasopharynx  . COPD (chronic obstructive pulmonary disease) ([place])   . DDD (degenerative disc disease), lumbar   . Diabetes mellitus without complication ([place])    related to prednisone use, no meds,cbg normal  . Diverticulosis   . Excessive or frequent menstruation   . GERD (gastroesophageal reflux disease)   . Headache(784.0)   . Hypothyroidism   . Irregular menstrual cycle   . Leiomyoma of uterus, unspecified   . Lupus ([place])   . Migraines   . Neuromuscular disorder (HCC)    neuropathy in legs/feet  . Neuropathy   . Papanicolaou smear of cervix with atypical squamous cells of undetermined significance (ASC-US)   . Psoriasis   . Sinusitis   . Trichimoniasis   . Wears partial dentures    full upper and lower partial    Family History  Problem Relation Age of Onset  . Lupus Brother   . Breast cancer Maternal Grandmother 87  . Hypertension Mother   . Diabetes Mother   . Hypertension Father   . Colon cancer Neg Hx   . Esophageal cancer Neg Hx   . Rectal cancer Neg Hx   . Stomach cancer Neg Hx    Past Surgical History:  Procedure Laterality Date  . ANKLE ARTHROSCOPY Right   . CESAREAN SECTION     x 1  . DILATION AND CURETTAGE OF UTERUS     x 1 Missed Abortion  . LYMPH NODE BIOPSY  neck  . MULTIPLE EXTRACTIONS WITH ALVEOLOPLASTY N/A 03/05/2013   Procedure: MULTIPLE EXTRACION WITH ALVEOLOPLASTY, extraction of decayed teeth numbers 3,4,5,18,19,22,23,24,25, extraction of retained root tips teeth numbers 2,20,21,26, bilateral mandibular alveoloplasty and upper right maxillary alveoloplasty;  Surgeon: Isac Caddy, DDS;  Location: [facility];  Service: Oral Surgery;  Laterality: N/A;  . scalp biopsy    . TOOTH EXTRACTION Bilateral 03/05/2013   Procedure: EXTRACTION MOLARS;  Surgeon: Isac Caddy, DDS;  Location: [facility];  Service: Oral Surgery;  Laterality: Bilateral;   Social History   Social History Narrative  . Not on file    Objective: Vital Signs: LMP 06/25/2016    Physical Exam   Musculoskeletal Exam: ***  CDAI Exam: CDAI Score: Not documented Patient Global Assessment: Not documented; Provider Global Assessment: Not documented Swollen: Not documented; Tender: Not documented Joint Exam   Not documented   There is currently no information documented on the homunculus. Go to the Rheumatology activity and complete the homunculus joint exam.  Investigation: No additional findings.  Imaging: No results found.  Recent Labs: Lab Results  Component Value Date   WBC 5.7 02/27/2018   HGB 12.1 02/27/2018   PLT 276 02/27/2018   NA 144 02/27/2018   K 3.5 02/27/2018   CL 110 02/27/2018   CO2 27 02/27/2018   GLUCOSE 96 02/27/2018   BUN 10 02/27/2018   CREATININE 1.04 (H) 02/27/2018   BILITOT 0.3 10/21/2017   ALKPHOS 60 01/12/2017   AST 14 10/21/2017   ALT 10 10/21/2017   PROT 6.9 10/21/2017   ALBUMIN 3.9 01/12/2017   CALCIUM 9.5 02/27/2018   GFRAA >60 02/27/2018   QFTBGOLDPLUS NEGATIVE 10/21/2017    Speciality Comments: No specialty comments available.  Procedures:  No procedures performed Allergies: Amoxicillin; Latex; Percocet [oxycodone-acetaminophen]; Plaquenil [hydroxychloroquine sulfate]; Bactrim [sulfamethoxazole-trimethoprim]; Ciprofloxacin hcl; Keflex [cephalexin]; Penicillins; and Tetracyclines & related   Assessment / Plan:     Visit Diagnoses: No diagnosis found.   Orders: No orders of the defined types were placed in this encounter.  No orders of the defined types were placed in this encounter.   Face-to-face time spent with patient was *** minutes. Greater than 50% of time was spent in counseling and coordination of care.  Follow-Up Instructions: No follow-ups on file.   Ofilia Neas, PA-C  Note - This record has been created using Dragon  software.  Chart creation errors have been sought, but may not always  have been located. Such creation errors do not reflect on  the standard of medical care.

## 2018-06-21 DIAGNOSIS — R002 Palpitations: Secondary | ICD-10-CM

## 2018-06-21 HISTORY — DX: Palpitations: R00.2

## 2018-06-28 ENCOUNTER — Other Ambulatory Visit: Payer: Self-pay | Admitting: *Deleted

## 2018-06-28 DIAGNOSIS — Z79899 Other long term (current) drug therapy: Secondary | ICD-10-CM

## 2018-06-29 ENCOUNTER — Ambulatory Visit: Payer: Medicare Other | Admitting: Physician Assistant

## 2018-06-29 LAB — COMPLETE METABOLIC PANEL WITH GFR
AG RATIO: 1.6 (calc) (ref 1.0–2.5)
ALKALINE PHOSPHATASE (APISO): 57 U/L (ref 33–130)
ALT: 7 U/L (ref 6–29)
AST: 15 U/L (ref 10–35)
Albumin: 4.2 g/dL (ref 3.6–5.1)
BUN: 12 mg/dL (ref 7–25)
CO2: 27 mmol/L (ref 20–32)
Calcium: 9.5 mg/dL (ref 8.6–10.4)
Chloride: 107 mmol/L (ref 98–110)
Creat: 0.87 mg/dL (ref 0.50–1.05)
GFR, Est African American: 88 mL/min/{1.73_m2} (ref >=60)
GFR, Est Non African American: 76 mL/min/{1.73_m2} (ref >=60)
GLOBULIN: 2.7 g/dL (ref 1.9–3.7)
Glucose, Bld: 85 mg/dL (ref 65–99)
POTASSIUM: 3.4 mmol/L — AB (ref 3.5–5.3)
SODIUM: 143 mmol/L (ref 135–146)
Total Bilirubin: 0.4 mg/dL (ref 0.2–1.2)
Total Protein: 6.9 g/dL (ref 6.1–8.1)

## 2018-06-29 LAB — CBC WITH DIFFERENTIAL/PLATELET
Absolute Monocytes: 414 cells/uL (ref 200–950)
BASOS PCT: 0.7 %
Basophils Absolute: 39 cells/uL (ref 0–200)
EOS PCT: 2 %
Eosinophils Absolute: 112 cells/uL (ref 15–500)
HCT: 35.3 % (ref 35.0–45.0)
Hemoglobin: 11.5 g/dL — ABNORMAL LOW (ref 11.7–15.5)
Lymphs Abs: 2240 cells/uL (ref 850–3900)
MCH: 30 pg (ref 27.0–33.0)
MCHC: 32.6 g/dL (ref 32.0–36.0)
MCV: 92.2 fL (ref 80.0–100.0)
MONOS PCT: 7.4 %
MPV: 11.5 fL (ref 7.5–12.5)
Neutro Abs: 2794 cells/uL (ref 1500–7800)
Neutrophils Relative %: 49.9 %
PLATELETS: 255 10*3/uL (ref 140–400)
RBC: 3.83 10*6/uL (ref 3.80–5.10)
RDW: 12.1 % (ref 11.0–15.0)
TOTAL LYMPHOCYTE: 40 %
WBC: 5.6 10*3/uL (ref 3.8–10.8)

## 2018-06-29 NOTE — Progress Notes (Deleted)
Office Visit Note  Patient: [name]             Date of Birth: 1965-11-14           MRN: [id number]             PCP: Nolene Ebbs, MD Referring: Nolene Ebbs, MD Visit Date: 07/03/2018 Occupation: @GUAROCC @  Subjective:  No chief complaint on file.  Cosentyx 300 mg every 28 days.  Last TB gold negative on 10/21/2017.  Most recent CBC/CMP within normal limits except low potassium and mild anemia.  Will monitor CBC/CMP every 3 months for drug toxicity.  Standing orders are in place. Recommend annual influenza, Pneumovax 23, Prevnar 13, and Shingrix as indicated.  History of Present Illness: [name] is a 53 y.o. female ***   Activities of Daily Living:  Patient reports morning stiffness for *** {minute/hour:19697}.   Patient {ACTIONS;DENIES/REPORTS:21021675::"Denies"} nocturnal pain.  Difficulty dressing/grooming: {ACTIONS;DENIES/REPORTS:21021675::"Denies"} Difficulty climbing stairs: {ACTIONS;DENIES/REPORTS:21021675::"Denies"} Difficulty getting out of chair: {ACTIONS;DENIES/REPORTS:21021675::"Denies"} Difficulty using hands for taps, buttons, cutlery, and/or writing: {ACTIONS;DENIES/REPORTS:21021675::"Denies"}  No Rheumatology ROS completed.   PMFS History:  Patient Active Problem List   Diagnosis Date Noted  . 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 History of asthma 01/10/2017  . Psoriatic arthritis ([place]) 08/10/2016  . High risk medication use 08/10/2016  . Chronic pain syndrome 08/22/2013  . Pulmonary infiltrates 06/30/2013  . Psoriasis   . GERD (gastroesophageal reflux disease)   . Hypothyroidism   . Pleurisy 06/19/2013  . Caries 03/04/2013  . Nephrolithiasis 04/05/2012  . Hydronephrosis 04/05/2012  . UTI (lower urinary tract infection) 04/05/2012  . On prednisone therapy 04/05/2012  . Positive ANA (antinuclear antibody) 04/05/2012    Past Medical History:  Diagnosis Date  . Allergy   . Anxiety   . Arthritis    ra, lupus -  ankles, knees, hands, neck  . Blood transfusion without reported diagnosis [place] with c/s surgery  . Bronchitis    Hx  . Cancer (HCC)    nasopharynx  . COPD (chronic obstructive pulmonary disease) ([place])   . DDD (degenerative disc disease), lumbar   . Diabetes mellitus without complication ([place])    related to prednisone use, no meds,cbg normal  . Diverticulosis   . Excessive or frequent menstruation   . GERD (gastroesophageal reflux disease)   . Headache(784.0)   . Hypothyroidism   . Irregular menstrual cycle   . Leiomyoma of uterus, unspecified   . Lupus ([place])   . Migraines   . Neuromuscular disorder (HCC)    neuropathy in legs/feet  . Neuropathy   . Papanicolaou smear of cervix with atypical squamous cells of undetermined significance (ASC-US)   . Psoriasis   . Sinusitis   . Trichimoniasis   . Wears partial dentures    full upper and lower partial    Family History  Problem Relation Age of Onset  . Lupus Brother   . Breast cancer Maternal Grandmother 36  . Hypertension Mother   . Diabetes Mother   . Hypertension Father   . Colon cancer Neg Hx   . Esophageal cancer Neg Hx   . Rectal cancer Neg Hx   . Stomach cancer Neg Hx    Past Surgical History:  Procedure Laterality Date  . ANKLE ARTHROSCOPY Right   . CESAREAN SECTION     x 1  . DILATION AND CURETTAGE OF UTERUS     x 1 Missed Abortion  . LYMPH NODE BIOPSY  neck  . MULTIPLE EXTRACTIONS WITH ALVEOLOPLASTY N/A 03/05/2013   Procedure: MULTIPLE EXTRACION WITH ALVEOLOPLASTY, extraction of decayed teeth numbers 3,4,5,18,19,22,23,24,25, extraction of retained root tips teeth numbers 2,20,21,26, bilateral mandibular alveoloplasty and upper right maxillary alveoloplasty;  Surgeon: Isac Caddy, DDS;  Location: [facility];  Service: Oral Surgery;  Laterality: N/A;  . scalp biopsy    . TOOTH EXTRACTION Bilateral 03/05/2013   Procedure: EXTRACTION MOLARS;  Surgeon: Isac Caddy, DDS;  Location: [facility];  Service: Oral Surgery;  Laterality: Bilateral;   Social History   Social History Narrative  . Not on file   Immunization History  Administered Date(s) Administered  . Influenza Split 02/27/2013, 03/21/2014  . PPD Test 06/25/2015, 04/29/2016, 01/12/2017     Objective: Vital Signs: LMP 06/25/2016    Physical Exam   Musculoskeletal Exam: ***  CDAI Exam: CDAI Score: Not documented Patient Global Assessment: Not documented; Provider Global Assessment: Not documented Swollen: Not documented; Tender: Not documented Joint Exam   Not documented   There is currently no information documented on the homunculus. Go to the Rheumatology activity and complete the homunculus joint exam.  Investigation: No additional findings.  Imaging: No results found.  Recent Labs: Lab Results  Component Value Date   WBC 5.6 06/28/2018   HGB 11.5 (L) 06/28/2018   PLT 255 06/28/2018   NA 143 06/28/2018   K 3.4 (L) 06/28/2018   CL 107 06/28/2018   CO2 27 06/28/2018   GLUCOSE 85 06/28/2018   BUN 12 06/28/2018   CREATININE 0.87 06/28/2018   BILITOT 0.4 06/28/2018   ALKPHOS 60 01/12/2017   AST 15 06/28/2018   ALT 7 06/28/2018   PROT 6.9 06/28/2018   ALBUMIN 3.9 01/12/2017   CALCIUM 9.5 06/28/2018   GFRAA 88 06/28/2018   QFTBGOLDPLUS NEGATIVE 10/21/2017    Speciality Comments: No specialty comments available.  Procedures:  No procedures performed Allergies: Amoxicillin; Latex; Percocet [oxycodone-acetaminophen]; Plaquenil [hydroxychloroquine sulfate]; Bactrim [sulfamethoxazole-trimethoprim]; Ciprofloxacin hcl; Keflex [cephalexin]; Penicillins; and Tetracyclines & related   Assessment / Plan:     Visit Diagnoses: Psoriatic arthritis ([place])  Psoriasis  High risk medication use - Dr. 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  No orders of the defined types were placed in this encounter.   Face-to-face time spent with patient was *** minutes. Greater than 50% of time was spent in counseling and coordination of care.  Follow-Up Instructions: No follow-ups on file.   Ofilia Neas, PA-C  Note - This record has been created using Dragon software.  Chart creation errors have been sought, but may not always  have been located. Such creation errors do not reflect on  the standard of medical care.

## 2018-06-29 NOTE — Progress Notes (Signed)
Potassium is low.  Mild anemia noted.  Please forward labs to her PCP.

## 2018-06-30 ENCOUNTER — Telehealth: Payer: Self-pay | Admitting: Rheumatology

## 2018-06-30 NOTE — Telephone Encounter (Signed)
Patient request lab results to be sent over to GP due to patient's Anemia. Fax # [phone]

## 2018-06-30 NOTE — Telephone Encounter (Signed)
Please forward labs to her PCP, see previous message.

## 2018-07-03 ENCOUNTER — Ambulatory Visit: Payer: Medicare Other | Admitting: Physician Assistant

## 2018-07-03 NOTE — Progress Notes (Deleted)
Office Visit Note  Patient: [name]             Date of Birth: [birth year]           MRN: [id number]             PCP: Nolene Ebbs, MD Referring: Nolene Ebbs, MD Visit Date: 07/06/2018 Occupation: @GUAROCC @  Subjective:  No chief complaint on file.  Cosentyx 300 mg every 28 days prescribed by Dr. Sharol Roussel. Last TB gold negative on 10/21/17.  Most recent CBC/CMP within normal limits except mild anemia and low potassium on 06/28/2018. Will monitor every 3 months and standing orders are in place. Recommend annual influenza, Pneumovax 23, Prevnar 13, and Shingrix as indicated.  History of Present Illness: [name] is a 53 y.o. female ***   Activities of Daily Living:  Patient reports morning stiffness for *** {minute/hour:19697}.   Patient {ACTIONS;DENIES/REPORTS:21021675::"Denies"} nocturnal pain.  Difficulty dressing/grooming: {ACTIONS;DENIES/REPORTS:21021675::"Denies"} Difficulty climbing stairs: {ACTIONS;DENIES/REPORTS:21021675::"Denies"} Difficulty getting out of chair: {ACTIONS;DENIES/REPORTS:21021675::"Denies"} Difficulty using hands for taps, buttons, cutlery, and/or writing: {ACTIONS;DENIES/REPORTS:21021675::"Denies"}  No Rheumatology ROS completed.   PMFS History:  Patient Active Problem List   Diagnosis Date Noted  . 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 History of asthma 01/10/2017  . Psoriatic arthritis ([place]) 08/10/2016  . High risk medication use 08/10/2016  . Chronic pain syndrome 08/22/2013  . Pulmonary infiltrates 06/30/2013  . Psoriasis   . GERD (gastroesophageal reflux disease)   . Hypothyroidism   . Pleurisy 06/19/2013  . Caries 03/04/2013  . Nephrolithiasis 04/05/2012  . Hydronephrosis 04/05/2012  . UTI (lower urinary tract infection) 04/05/2012  . On prednisone therapy 04/05/2012  . Positive ANA (antinuclear antibody) 04/05/2012    Past Medical History:  Diagnosis Date  . Allergy   . Anxiety   . Arthritis    ra,  lupus - ankles, knees, hands, neck  . Blood transfusion without reported diagnosis [place] with c/s surgery  . Bronchitis    Hx  . Cancer (HCC)    nasopharynx  . COPD (chronic obstructive pulmonary disease) ([place])   . DDD (degenerative disc disease), lumbar   . Diabetes mellitus without complication ([place])    related to prednisone use, no meds,cbg normal  . Diverticulosis   . Excessive or frequent menstruation   . GERD (gastroesophageal reflux disease)   . Headache(784.0)   . Hypothyroidism   . Irregular menstrual cycle   . Leiomyoma of uterus, unspecified   . Lupus ([place])   . Migraines   . Neuromuscular disorder (HCC)    neuropathy in legs/feet  . Neuropathy   . Papanicolaou smear of cervix with atypical squamous cells of undetermined significance (ASC-US)   . Psoriasis   . Sinusitis   . Trichimoniasis   . Wears partial dentures    full upper and lower partial    Family History  Problem Relation Age of Onset  . Lupus Brother   . Breast cancer Maternal Grandmother 55  . Hypertension Mother   . Diabetes Mother   . Hypertension Father   . Colon cancer Neg Hx   . Esophageal cancer Neg Hx   . Rectal cancer Neg Hx   . Stomach cancer Neg Hx    Past Surgical History:  Procedure Laterality Date  . ANKLE ARTHROSCOPY Right   . CESAREAN SECTION     x 1  . DILATION AND CURETTAGE OF UTERUS     x 1 Missed Abortion  . LYMPH NODE BIOPSY  neck  . MULTIPLE EXTRACTIONS WITH ALVEOLOPLASTY N/A 03/05/2013   Procedure: MULTIPLE EXTRACION WITH ALVEOLOPLASTY, extraction of decayed teeth numbers 3,4,5,18,19,22,23,24,25, extraction of retained root tips teeth numbers 2,20,21,26, bilateral mandibular alveoloplasty and upper right maxillary alveoloplasty;  Surgeon: Isac Caddy, DDS;  Location: [facility];  Service: Oral Surgery;  Laterality: N/A;  . scalp biopsy    . TOOTH EXTRACTION Bilateral 03/05/2013   Procedure: EXTRACTION MOLARS;  Surgeon: Isac Caddy, DDS;   Location: [facility];  Service: Oral Surgery;  Laterality: Bilateral;   Social History   Social History Narrative  . Not on file   Immunization History  Administered Date(s) Administered  . Influenza Split 02/27/2013, 03/21/2014  . PPD Test 06/25/2015, 04/29/2016, 01/12/2017     Objective: Vital Signs: LMP 06/25/2016    Physical Exam   Musculoskeletal Exam: ***  CDAI Exam: CDAI Score: Not documented Patient Global Assessment: Not documented; Provider Global Assessment: Not documented Swollen: Not documented; Tender: Not documented Joint Exam   Not documented   There is currently no information documented on the homunculus. Go to the Rheumatology activity and complete the homunculus joint exam.  Investigation: No additional findings.  Imaging: No results found.  Recent Labs: Lab Results  Component Value Date   WBC 5.6 06/28/2018   HGB 11.5 (L) 06/28/2018   PLT 255 06/28/2018   NA 143 06/28/2018   K 3.4 (L) 06/28/2018   CL 107 06/28/2018   CO2 27 06/28/2018   GLUCOSE 85 06/28/2018   BUN 12 06/28/2018   CREATININE 0.87 06/28/2018   BILITOT 0.4 06/28/2018   ALKPHOS 60 01/12/2017   AST 15 06/28/2018   ALT 7 06/28/2018   PROT 6.9 06/28/2018   ALBUMIN 3.9 01/12/2017   CALCIUM 9.5 06/28/2018   GFRAA 88 06/28/2018   QFTBGOLDPLUS NEGATIVE 10/21/2017    Speciality Comments: No specialty comments available.  Procedures:  No procedures performed Allergies: Amoxicillin; Latex; Percocet [oxycodone-acetaminophen]; Plaquenil [hydroxychloroquine sulfate]; Bactrim [sulfamethoxazole-trimethoprim]; Ciprofloxacin hcl; Keflex [cephalexin]; Penicillins; and Tetracyclines & related   Assessment / Plan:     Visit Diagnoses: Psoriatic arthritis ([place])  Psoriasis  High risk medication use - Dr. Sharol Roussel prescribes - Cosentyx 300 mg sq q month  Positive ANA (antinuclear antibody) - 1:40 and nucleolar pattern negative ENA  negative CCP.  History of  asthma  Nephrolithiasis  History of anxiety  History of hypothyroidism  History of gastroesophageal reflux (GERD)  Cancer of nasopharynx ([name])   Orders: No orders of the defined types were placed in this encounter.  No orders of the defined types were placed in this encounter.   Face-to-face time spent with patient was *** minutes. Greater than 50% of time was spent in counseling and coordination of care.  Follow-Up Instructions: No follow-ups on file.   Ofilia Neas, PA-C  Note - This record has been created using Dragon software.  Chart creation errors have been sought, but may not always  have been located. Such creation errors do not reflect on  the standard of medical care.

## 2018-07-03 NOTE — Telephone Encounter (Signed)
Labs forwarded to PCP

## 2018-07-06 ENCOUNTER — Ambulatory Visit: Payer: Medicare Other | Admitting: Physician Assistant

## 2018-07-10 NOTE — Progress Notes (Deleted)
Office Visit Note  Patient: [name]             Date of Birth: 07/29/1965           MRN: [id number]             PCP: Nolene Ebbs, MD Referring: Nolene Ebbs, MD Visit Date: 07/11/2018 Occupation: @GUAROCC @  Subjective:  No chief complaint on file.   History of Present Illness: [name] is a 53 y.o. female ***   Activities of Daily Living:  Patient reports morning stiffness for *** {minute/hour:19697}.   Patient {ACTIONS;DENIES/REPORTS:21021675::"Denies"} nocturnal pain.  Difficulty dressing/grooming: {ACTIONS;DENIES/REPORTS:21021675::"Denies"} Difficulty climbing stairs: {ACTIONS;DENIES/REPORTS:21021675::"Denies"} Difficulty getting out of chair: {ACTIONS;DENIES/REPORTS:21021675::"Denies"} Difficulty using hands for taps, buttons, cutlery, and/or writing: {ACTIONS;DENIES/REPORTS:21021675::"Denies"}  No Rheumatology ROS completed.   PMFS History:  Patient Active Problem List   Diagnosis Date Noted  . 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 History of asthma 01/10/2017  . Psoriatic arthritis ([place]) 08/10/2016  . High risk medication use 08/10/2016  . Chronic pain syndrome 08/22/2013  . Pulmonary infiltrates 06/30/2013  . Psoriasis   . GERD (gastroesophageal reflux disease)   . Hypothyroidism   . Pleurisy 06/19/2013  . Caries 03/04/2013  . Nephrolithiasis 04/05/2012  . Hydronephrosis 04/05/2012  . UTI (lower urinary tract infection) 04/05/2012  . On prednisone therapy 04/05/2012  . Positive ANA (antinuclear antibody) 04/05/2012    Past Medical History:  Diagnosis Date  . Allergy   . Anxiety   . Arthritis    ra, lupus - ankles, knees, hands, neck  . Blood transfusion without reported diagnosis [place] with c/s surgery  . Bronchitis    Hx  . Cancer (HCC)    nasopharynx  . COPD (chronic obstructive pulmonary disease) ([place])   . DDD (degenerative disc disease), lumbar   . Diabetes mellitus without complication ([place])    related to prednisone use, no meds,cbg normal  . Diverticulosis   . Excessive or frequent menstruation   . GERD (gastroesophageal reflux disease)   . Headache(784.0)   . Hypothyroidism   . Irregular menstrual cycle   . Leiomyoma of uterus, unspecified   . Lupus ([place])   . Migraines   . Neuromuscular disorder (HCC)    neuropathy in legs/feet  . Neuropathy   . Papanicolaou smear of cervix with atypical squamous cells of undetermined significance (ASC-US)   . Psoriasis   . Sinusitis   . Trichimoniasis   . Wears partial dentures    full upper and lower partial    Family History  Problem Relation Age of Onset  . Lupus Brother   . Breast cancer Maternal Grandmother 23  . Hypertension Mother   . Diabetes Mother   . Hypertension Father   . Colon cancer Neg Hx   . Esophageal cancer Neg Hx   . Rectal cancer Neg Hx   . Stomach cancer Neg Hx    Past Surgical History:  Procedure Laterality Date  . ANKLE ARTHROSCOPY Right   . CESAREAN SECTION     x 1  . DILATION AND CURETTAGE OF UTERUS     x 1 Missed Abortion  . LYMPH NODE BIOPSY     neck  . MULTIPLE EXTRACTIONS WITH ALVEOLOPLASTY N/A 03/05/2013   Procedure: MULTIPLE EXTRACION WITH ALVEOLOPLASTY, extraction of decayed teeth numbers 3,4,5,18,19,22,23,24,25, extraction of retained root tips teeth numbers 2,20,21,26, bilateral mandibular alveoloplasty and upper right maxillary alveoloplasty;  Surgeon: Isac Caddy, DDS;  Location: [facility];  Service: Oral Surgery;  Laterality: N/A;  . scalp biopsy    . TOOTH EXTRACTION Bilateral 03/05/2013   Procedure: EXTRACTION MOLARS;  Surgeon: Isac Caddy, DDS;  Location: [facility];  Service: Oral Surgery;  Laterality: Bilateral;   Social History   Social History Narrative  . Not on file   Immunization History  Administered Date(s) Administered  . Influenza Split 02/27/2013, 03/21/2014  . PPD Test 06/25/2015, 04/29/2016, 01/12/2017     Objective: Vital Signs: LMP 06/25/2016     Physical Exam   Musculoskeletal Exam: ***  CDAI Exam: CDAI Score: Not documented Patient Global Assessment: Not documented; Provider Global Assessment: Not documented Swollen: Not documented; Tender: Not documented Joint Exam   Not documented   There is currently no information documented on the homunculus. Go to the Rheumatology activity and complete the homunculus joint exam.  Investigation: No additional findings.  Imaging: No results found.  Recent Labs: Lab Results  Component Value Date   WBC 5.6 06/28/2018   HGB 11.5 (L) 06/28/2018   PLT 255 06/28/2018   NA 143 06/28/2018   K 3.4 (L) 06/28/2018   CL 107 06/28/2018   CO2 27 06/28/2018   GLUCOSE 85 06/28/2018   BUN 12 06/28/2018   CREATININE 0.87 06/28/2018   BILITOT 0.4 06/28/2018   ALKPHOS 60 01/12/2017   AST 15 06/28/2018   ALT 7 06/28/2018   PROT 6.9 06/28/2018   ALBUMIN 3.9 01/12/2017   CALCIUM 9.5 06/28/2018   GFRAA 88 06/28/2018   QFTBGOLDPLUS NEGATIVE 10/21/2017    Speciality Comments: No specialty comments available.  Procedures:  No procedures performed Allergies: Amoxicillin; Latex; Percocet [oxycodone-acetaminophen]; Plaquenil [hydroxychloroquine sulfate]; Bactrim [sulfamethoxazole-trimethoprim]; Ciprofloxacin hcl; Keflex [cephalexin]; Penicillins; and Tetracyclines & related   Assessment / Plan:     Visit Diagnoses: No diagnosis found.   Orders: No orders of the defined types were placed in this encounter.  No orders of the defined types were placed in this encounter.   Face-to-face time spent with patient was *** minutes. Greater than 50% of time was spent in counseling and coordination of care.  Follow-Up Instructions: No follow-ups on file.   Earnestine Mealing, CMA  Note - This record has been created using Editor, commissioning.  Chart creation errors have been sought, but may not always  have been located. Such creation errors do not reflect on  the standard of medical care.

## 2018-07-11 ENCOUNTER — Ambulatory Visit: Payer: Medicare Other | Admitting: Physician Assistant

## 2018-07-21 NOTE — Progress Notes (Signed)
Office Visit Note  Patient: [name]             Date of Birth: [birth year]           MRN: [id number]             PCP: Nolene Ebbs, MD Referring: Nolene Ebbs, MD Visit Date: 07/26/2018 Occupation: @GUAROCC @  Subjective:  Lower back pain.   History of Present Illness: [name] is a 53 y.o. female history of psoriatic arthritis and psoriasis.  She was placed on Cosentyx by her dermatologist.  She states she is not having any joint pain or joint swelling.  She has no psoriasis currently.  She has been experiencing lower back pain.  She states the pain has been radiating to her right lower extremity.  She had x-rays done by her PCP.  Activities of Daily Living:  Patient reports morning stiffness for 0 minutes.   Patient Denies nocturnal pain.  Difficulty dressing/grooming: Denies Difficulty climbing stairs: Denies Difficulty getting out of chair: Denies Difficulty using hands for taps, buttons, cutlery, and/or writing: Denies  Review of Systems  Constitutional: Negative for fatigue.  HENT: Positive for nose dryness. Negative for mouth sores and mouth dryness.        Nose sores   Eyes: Negative for itching and dryness.  Respiratory: Negative for shortness of breath and wheezing.   Cardiovascular: Negative for chest pain and hypertension.  Gastrointestinal: Negative for abdominal pain, constipation and diarrhea.  Endocrine: Negative for increased urination.  Genitourinary: Negative for painful urination, nocturia and pelvic pain.  Musculoskeletal: Negative for arthralgias, joint pain, joint swelling and morning stiffness.  Skin: Negative for rash and redness.  Allergic/Immunologic: Negative for susceptible to infections.  Neurological: Negative for dizziness, light-headedness, headaches, memory loss and weakness.  Hematological: Negative for bruising/bleeding tendency.  Psychiatric/Behavioral: Negative for confusion. The patient is not  nervous/anxious.     PMFS History:  Patient Active Problem List   Diagnosis Date Noted  . 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 History of asthma 01/10/2017  . Psoriatic arthritis ([place]) 08/10/2016  . High risk medication use 08/10/2016  . Chronic pain syndrome 08/22/2013  . Pulmonary infiltrates 06/30/2013  . Psoriasis   . GERD (gastroesophageal reflux disease)   . Hypothyroidism   . Pleurisy 06/19/2013  . Caries 03/04/2013  . Nephrolithiasis 04/05/2012  . Hydronephrosis 04/05/2012  . UTI (lower urinary tract infection) 04/05/2012  . On prednisone therapy 04/05/2012  . Positive ANA (antinuclear antibody) 04/05/2012    Past Medical History:  Diagnosis Date  . Allergy   . Anxiety   . Arthritis    ra, lupus - ankles, knees, hands, neck  . Blood transfusion without reported diagnosis [place] with c/s surgery  . Bronchitis    Hx  . Cancer (HCC)    nasopharynx  . COPD (chronic obstructive pulmonary disease) ([place])   . DDD (degenerative disc disease), lumbar   . Diabetes mellitus without complication ([place])    related to prednisone use, no meds,cbg normal  . Diverticulosis   . Excessive or frequent menstruation   . GERD (gastroesophageal reflux disease)   . Headache(784.0)   . Hypothyroidism   . Irregular menstrual cycle   . Leiomyoma of uterus, unspecified   . Lupus ([place])   . Migraines   . Neuromuscular disorder (HCC)    neuropathy in legs/feet  . Neuropathy   . Papanicolaou smear of cervix with atypical squamous cells of undetermined significance (ASC-US)   .  Psoriasis   . Sinusitis   . Trichimoniasis   . Wears partial dentures    full upper and lower partial    Family History  Problem Relation Age of Onset  . Lupus Brother   . Breast cancer Maternal Grandmother 56  . Hypertension Mother   . Diabetes Mother   . Hypertension Father   . Colon cancer Neg Hx   . Esophageal cancer Neg Hx   . Rectal cancer Neg Hx   . Stomach cancer Neg Hx    Past  Surgical History:  Procedure Laterality Date  . ANKLE ARTHROSCOPY Right   . CESAREAN SECTION     x 1  . DILATION AND CURETTAGE OF UTERUS     x 1 Missed Abortion  . LYMPH NODE BIOPSY     neck  . MULTIPLE EXTRACTIONS WITH ALVEOLOPLASTY N/A 03/05/2013   Procedure: MULTIPLE EXTRACION WITH ALVEOLOPLASTY, extraction of decayed teeth numbers 3,4,5,18,19,22,23,24,25, extraction of retained root tips teeth numbers 2,20,21,26, bilateral mandibular alveoloplasty and upper right maxillary alveoloplasty;  Surgeon: Isac Caddy, DDS;  Location: [facility];  Service: Oral Surgery;  Laterality: N/A;  . scalp biopsy    . TOOTH EXTRACTION Bilateral 03/05/2013   Procedure: EXTRACTION MOLARS;  Surgeon: Isac Caddy, DDS;  Location: [facility];  Service: Oral Surgery;  Laterality: Bilateral;   Social History   Social History Narrative  . Not on file   Immunization History  Administered Date(s) Administered  . Influenza Split 02/27/2013, 03/21/2014  . PPD Test 06/25/2015, 04/29/2016, 01/12/2017     Objective: Vital Signs: BP 134/79 (BP Location: Right Arm, Patient Position: Sitting, Cuff Size: Normal)   Pulse 79   Resp 14   Ht 5\' 6"  (1.676 m)   Wt 162 lb 12.8 oz (73.8 kg)   LMP 06/25/2016   BMI 26.28 kg/m    Physical Exam Vitals signs and nursing note reviewed.  Constitutional:      Appearance: She is well-developed.  HENT:     Head: Normocephalic and atraumatic.  Eyes:     Conjunctiva/sclera: Conjunctivae normal.  Neck:     Musculoskeletal: Normal range of motion.  Cardiovascular:     Rate and Rhythm: Normal rate and regular rhythm.     Heart sounds: Normal heart sounds.  Pulmonary:     Effort: Pulmonary effort is normal.     Breath sounds: Normal breath sounds.  Abdominal:     General: Bowel sounds are normal.     Palpations: Abdomen is soft.  Lymphadenopathy:     Cervical: No cervical adenopathy.  Skin:    General: Skin is warm and dry.     Capillary Refill: Capillary  refill takes less than 2 seconds.  Neurological:     Mental Status: She is alert and oriented to person, place, and time.  Psychiatric:        Behavior: Behavior normal.      Musculoskeletal Exam: Spine thoracic lumbar spine good range of motion.  She has thoracolumbar scoliosis.  Shoulder joints elbow joints wrist joints with good range of motion.  She has synovial thickening over MCP joints and some arthritic changes in her hands due to psoriatic arthritis.  No active synovitis was noted.  Hip joints knee joints ankles MTPs PIPs been good range of motion with no synovitis.  CDAI Exam: CDAI Score: 0.2  Patient Global Assessment: 1 (mm); Provider Global Assessment: 1 (mm) Swollen: 0 ; Tender: 0  Joint Exam   Not documented   There is  currently no information documented on the homunculus. Go to the Rheumatology activity and complete the homunculus joint exam.  Investigation: No additional findings.  Imaging: No results found.  Recent Labs: Lab Results  Component Value Date   WBC 5.6 06/28/2018   HGB 11.5 (L) 06/28/2018   PLT 255 06/28/2018   NA 143 06/28/2018   K 3.4 (L) 06/28/2018   CL 107 06/28/2018   CO2 27 06/28/2018   GLUCOSE 85 06/28/2018   BUN 12 06/28/2018   CREATININE 0.87 06/28/2018   BILITOT 0.4 06/28/2018   ALKPHOS 60 01/12/2017   AST 15 06/28/2018   ALT 7 06/28/2018   PROT 6.9 06/28/2018   ALBUMIN 3.9 01/12/2017   CALCIUM 9.5 06/28/2018   GFRAA 88 06/28/2018   QFTBGOLDPLUS NEGATIVE 10/21/2017    Speciality Comments: No specialty comments available.  Procedures:  No procedures performed Allergies: Amoxicillin; Latex; Percocet [oxycodone-acetaminophen]; Plaquenil [hydroxychloroquine sulfate]; Bactrim [sulfamethoxazole-trimethoprim]; Ciprofloxacin hcl; Keflex [cephalexin]; Penicillins; and Tetracyclines & related   Assessment / Plan:     Visit Diagnoses: Psoriatic arthritis (HCC)-patient had no active synovitis on examination today.  She does have  some deformities in her hands due to prior inflammation.  Her arthritis is very well controlled on Cosentyx.  Psoriasis-she has no active psoriasis lesions.  High risk medication use - Dr. Sharol Roussel prescribes - Cosentyx 300 mg sq q month.  Her labs have been stable except for mild anemia and low potassium.  She has been followed by her PCP for that.  Lower back pain, chronic midline without sciatica-patient gives history of intermittent lower back pain.  Currently the pain is better.  Have given her a handout on back exercises.  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  No orders of the defined types were placed in this encounter.    Follow-Up Instructions: Return in about 6 months (around 01/24/2019) for Psoriatic arthritis.   Bo Merino, MD  Note - This record has been created using Editor, commissioning.  Chart creation errors have been sought, but may not always  have been located. Such creation errors do not reflect on  the standard of medical care.

## 2018-07-26 ENCOUNTER — Ambulatory Visit (INDEPENDENT_AMBULATORY_CARE_PROVIDER_SITE_OTHER): Payer: Medicare Other | Admitting: Rheumatology

## 2018-07-26 ENCOUNTER — Encounter: Payer: Self-pay | Admitting: Rheumatology

## 2018-07-26 ENCOUNTER — Encounter (INDEPENDENT_AMBULATORY_CARE_PROVIDER_SITE_OTHER): Payer: Self-pay

## 2018-07-26 VITALS — BP 134/79 | HR 79 | Resp 14 | Ht 66.0 in | Wt 162.8 lb

## 2018-07-26 DIAGNOSIS — L409 Psoriasis, unspecified: Secondary | ICD-10-CM

## 2018-07-26 DIAGNOSIS — R768 Other specified abnormal immunological findings in serum: Secondary | ICD-10-CM

## 2018-07-26 DIAGNOSIS — Z79899 Other long term (current) drug therapy: Secondary | ICD-10-CM

## 2018-07-26 DIAGNOSIS — C119 Malignant neoplasm of nasopharynx, unspecified: Secondary | ICD-10-CM

## 2018-07-26 DIAGNOSIS — Z8709 Personal history of other diseases of the respiratory system: Secondary | ICD-10-CM

## 2018-07-26 DIAGNOSIS — M545 Low back pain: Secondary | ICD-10-CM

## 2018-07-26 DIAGNOSIS — Z8639 Personal history of other endocrine, nutritional and metabolic disease: Secondary | ICD-10-CM

## 2018-07-26 DIAGNOSIS — Z8719 Personal history of other diseases of the digestive system: Secondary | ICD-10-CM

## 2018-07-26 DIAGNOSIS — L4050 Arthropathic psoriasis, unspecified: Secondary | ICD-10-CM | POA: Diagnosis not present

## 2018-07-26 DIAGNOSIS — Z8659 Personal history of other mental and behavioral disorders: Secondary | ICD-10-CM

## 2018-07-26 DIAGNOSIS — N200 Calculus of kidney: Secondary | ICD-10-CM

## 2018-07-26 DIAGNOSIS — G8929 Other chronic pain: Secondary | ICD-10-CM

## 2018-07-26 NOTE — Patient Instructions (Addendum)
Standing Labs We placed an order today for your standing lab work.    Please come back and get your standing labs in April and every 3 months Back Exercises The following exercises strengthen the muscles that help to support the back. They also help to keep the lower back flexible. Doing these exercises can help to prevent back pain or lessen existing pain. If you have back pain or discomfort, try doing these exercises 2-3 times each day or as told by your health care provider. When the pain goes away, do them once each day, but increase the number of times that you repeat the steps for each exercise (do more repetitions). If you do not have back pain or discomfort, do these exercises once each day or as told by your health care provider. Exercises Single Knee to Chest Repeat these steps 3-5 times for each leg: 1. Lie on your back on a firm bed or the floor with your legs extended. 2. Bring one knee to your chest. Your other leg should stay extended and in contact with the floor. 3. Hold your knee in place by grabbing your knee or thigh. 4. Pull on your knee until you feel a gentle stretch in your lower back. 5. Hold the stretch for 10-30 seconds. 6. Slowly release and straighten your leg. Pelvic Tilt Repeat these steps 5-10 times: 1. Lie on your back on a firm bed or the floor with your legs extended. 2. Bend your knees so they are pointing toward the ceiling and your feet are flat on the floor. 3. Tighten your lower abdominal muscles to press your lower back against the floor. This motion will tilt your pelvis so your tailbone points up toward the ceiling instead of pointing to your feet or the floor. 4. With gentle tension and even breathing, hold this position for 5-10 seconds. Cat-Cow Repeat these steps until your lower back becomes more flexible: 1. Get into a hands-and-knees position on a firm surface. Keep your hands under your shoulders, and keep your knees under your hips. You may  place padding under your knees for comfort. 2. Let your head hang down, and point your tailbone toward the floor so your lower back becomes rounded like the back of a cat. 3. Hold this position for 5 seconds. 4. Slowly lift your head and point your tailbone up toward the ceiling so your back forms a sagging arch like the back of a cow. 5. Hold this position for 5 seconds.  Press-Ups Repeat these steps 5-10 times: 1. Lie on your abdomen (face-down) on the floor. 2. Place your palms near your head, about shoulder-width apart. 3. While you keep your back as relaxed as possible and keep your hips on the floor, slowly straighten your arms to raise the top half of your body and lift your shoulders. Do not use your back muscles to raise your upper torso. You may adjust the placement of your hands to make yourself more comfortable. 4. Hold this position for 5 seconds while you keep your back relaxed. 5. Slowly return to lying flat on the floor.  Bridges Repeat these steps 10 times: 1. Lie on your back on a firm surface. 2. Bend your knees so they are pointing toward the ceiling and your feet are flat on the floor. 3. Tighten your buttocks muscles and lift your buttocks off of the floor until your waist is at almost the same height as your knees. You should feel the muscles working in  your buttocks and the back of your thighs. If you do not feel these muscles, slide your feet 1-2 inches farther away from your buttocks. 4. Hold this position for 3-5 seconds. 5. Slowly lower your hips to the starting position, and allow your buttocks muscles to relax completely. If this exercise is too easy, try doing it with your arms crossed over your chest. Abdominal Crunches Repeat these steps 5-10 times: 1. Lie on your back on a firm bed or the floor with your legs extended. 2. Bend your knees so they are pointing toward the ceiling and your feet are flat on the floor. 3. Cross your arms over your chest. 4. Tip  your chin slightly toward your chest without bending your neck. 5. Tighten your abdominal muscles and slowly raise your trunk (torso) high enough to lift your shoulder blades a tiny bit off of the floor. Avoid raising your torso higher than that, because it can put too much stress on your low back and it does not help to strengthen your abdominal muscles. 6. Slowly return to your starting position. Back Lifts Repeat these steps 5-10 times: 1. Lie on your abdomen (face-down) with your arms at your sides, and rest your forehead on the floor. 2. Tighten the muscles in your legs and your buttocks. 3. Slowly lift your chest off of the floor while you keep your hips pressed to the floor. Keep the back of your head in line with the curve in your back. Your eyes should be looking at the floor. 4. Hold this position for 3-5 seconds. 5. Slowly return to your starting position. Contact a health care provider if:  Your back pain or discomfort gets much worse when you do an exercise.  Your back pain or discomfort does not lessen within 2 hours after you exercise. If you have any of these problems, stop doing these exercises right away. Do not do them again unless your health care provider says that you can. Get help right away if:  You develop sudden, severe back pain. If this happens, stop doing the exercises right away. Do not do them again unless your health care provider says that you can. This information is not intended to replace advice given to you by your health care provider. Make sure you discuss any questions you have with your health care provider. Document Released: 07/15/2004 Document Revised: 10/11/2017 Document Reviewed: 08/01/2014 Elsevier Interactive Patient Education  [employer].   We have open lab Monday through Friday from 8:30-11:30 AM and 1:30-4:00 PM  at the office of Dr. Bo Merino.   You may experience shorter wait times on Monday and Friday afternoons. The  office is located at [address], [place], [place], [place] [address] No appointment is necessary.   Labs are drawn by [facility].  You may receive a bill from [facility] for your lab work.  If you wish to have your labs drawn at another location, please call the office 24 hours in advance to send orders.  If you have any questions regarding directions or hours of operation,  please call [phone].   Just as a reminder please drink plenty of water prior to coming for your lab work. Thanks!

## 2018-07-27 ENCOUNTER — Ambulatory Visit: Payer: Medicare Other | Admitting: Physician Assistant

## 2018-08-08 ENCOUNTER — Ambulatory Visit: Payer: Medicare Other | Admitting: Physician Assistant

## 2018-08-16 ENCOUNTER — Ambulatory Visit (INDEPENDENT_AMBULATORY_CARE_PROVIDER_SITE_OTHER): Payer: Medicare Other | Admitting: Physician Assistant

## 2018-08-16 ENCOUNTER — Other Ambulatory Visit (INDEPENDENT_AMBULATORY_CARE_PROVIDER_SITE_OTHER): Payer: Medicare Other

## 2018-08-16 ENCOUNTER — Encounter: Payer: Self-pay | Admitting: Physician Assistant

## 2018-08-16 VITALS — BP 120/74 | HR 72 | Ht 66.0 in | Wt 161.6 lb

## 2018-08-16 DIAGNOSIS — R1031 Right lower quadrant pain: Secondary | ICD-10-CM

## 2018-08-16 DIAGNOSIS — R1013 Epigastric pain: Secondary | ICD-10-CM

## 2018-08-16 DIAGNOSIS — R630 Anorexia: Secondary | ICD-10-CM | POA: Diagnosis not present

## 2018-08-16 DIAGNOSIS — R634 Abnormal weight loss: Secondary | ICD-10-CM

## 2018-08-16 DIAGNOSIS — R109 Unspecified abdominal pain: Secondary | ICD-10-CM

## 2018-08-16 LAB — COMPREHENSIVE METABOLIC PANEL
ALK PHOS: 58 U/L (ref 39–117)
ALT: 8 U/L (ref 0–35)
AST: 16 U/L (ref 0–37)
Albumin: 4.2 g/dL (ref 3.5–5.2)
BILIRUBIN TOTAL: 0.3 mg/dL (ref 0.2–1.2)
BUN: 13 mg/dL (ref 6–23)
CO2: 29 mEq/L (ref 19–32)
CREATININE: 0.82 mg/dL (ref 0.40–1.20)
Calcium: 9.2 mg/dL (ref 8.4–10.5)
Chloride: 107 mEq/L (ref 96–112)
GFR: 88.11 mL/min (ref >60.00)
GLUCOSE: 96 mg/dL (ref 70–99)
Potassium: 3.2 mEq/L — ABNORMAL LOW (ref 3.5–5.1)
SODIUM: 143 meq/L (ref 135–145)
Total Protein: 7.7 g/dL (ref 6.0–8.3)

## 2018-08-16 NOTE — Patient Instructions (Addendum)
If you are age 54 or older, your body mass index should be between 23-30. Your Body mass index is 26.08 kg/m. If this is out of the aforementioned range listed, please consider follow up with your Primary Care Provider.  If you are age 80 or younger, your body mass index should be between 19-25. Your Body mass index is 26.08 kg/m. If this is out of the aformentioned range listed, please consider follow up with your Primary Care Provider.   Your provider has requested that you go to the basement level for lab work before leaving today. Press "B" on the elevator. The lab is located at the first door on the left as you exit the elevator.  You have been scheduled for a CT scan of the abdomen and pelvis at  [facility] are scheduled on 08/21/18 at 2:30 pm. You should arrive 15 minutes prior to your appointment time for registration. Please follow the written instructions below on the day of your exam:  WARNING: IF YOU ARE ALLERGIC TO IODINE/X-RAY DYE, PLEASE NOTIFY RADIOLOGY IMMEDIATELY AT [phone]! YOU WILL BE GIVEN A 13 HOUR PREMEDICATION PREP.  1) Do not eat or drink anything after 10:30 am (4 hours prior to your test) 2) You have been given 2 bottles of oral contrast to drink. The solution may taste better if refrigerated, but do NOT add ice or any other liquid to this solution. Shake well before drinking.    Drink 1 bottle of contrast @ 12:30 pm (2 hours prior to your exam)  Drink 1 bottle of contrast @ 1:30 pm (1 hour prior to your exam)  You may take any medications as prescribed with a small amount of water, if necessary. If you take any of the following medications: METFORMIN, GLUCOPHAGE, GLUCOVANCE, AVANDAMET, RIOMET, FORTAMET, [place] MET, JANUMET, GLUMETZA or METAGLIP, you MAY be asked to HOLD this medication 48 hours AFTER the exam.  The purpose of you drinking the oral contrast is to aid in the visualization of your intestinal tract. The contrast solution may cause  some diarrhea. Depending on your individual set of symptoms, you may also receive an intravenous injection of x-ray contrast/dye. Plan on being at [facility] for 30 minutes or longer, depending on the type of exam you are having performed.  This test typically takes 30-45 minutes to complete.  If you have any questions regarding your exam or if you need to reschedule, you may call [phone].  You will be getting an x-ray of the abdomen as well before or after CT.  We will call you with results.  Thank you for choosing me and [facility].  Amy Esterwood, PA-C

## 2018-08-16 NOTE — Progress Notes (Addendum)
Subjective:    Patient ID: [name], female    DOB: [birth year], 53 y.o.   MRN: [id number]  HPI [name] is a pleasant 53 year old African-American female, established with Dr. Hilarie Fredrickson.  She was last seen here in December 2017.  She did undergo EGD in January 2018 with finding of a small hiatal hernia there is no abnormality of the esophagus but she was empirically dilated for complaints of dysphasia. She also had colonoscopy in May 2017 to have multiple sigmoid diverticuli and a 3 mm polyp was removed from the cecum by path was benign polypoid colonic mucosa. She has remote history of nasopharyngeal cancer, chronic GERD, psoriatic arthritis for which she has been on Cosentyx over the past 1 year, lupus and a chronic pain syndrome. She comes in today with concerns about weight loss and lack of appetite.  She says she has not had much of an appetite or desire for food over the past several months.  She eats fairly regularly in order to take her meds etc.  He has not had any ongoing abdominal pain but says occasionally gets some cramping in her lower abdomen and intermittent epigastric discomfort especially if she is full.  Bowel movements have been normal, no melena or hematochezia. She states that her weight had gotten up as high as 225 pounds after she was treated for her cancer and required prednisone for a couple of years.  She was able to lose 50 pounds after coming off of the prednisone 2 years ago.  She is continued to gradually lose weight and is down to 161 today unintentionally. She has been exercising, trying to keep her self healthy but feels that she is lost significant muscle mass especially in her legs.  She is concerned about recurrent malignancy because she had presented similarly many years ago at the diagnosis of nasopharyngeal cancer.  Review of Systems Pertinent positive and negative review of systems were noted in the above HPI section.  All other review of systems was  otherwise negative.  Outpatient Encounter Medications as of 08/16/2018  Medication Sig  . acetaminophen (TYLENOL) 325 MG tablet Take 650 mg by mouth every 6 (six) hours as needed for mild pain or headache. Reported on 10/27/2015  . albuterol (PROVENTIL HFA;VENTOLIN HFA) 108 (90 BASE) MCG/ACT inhaler Inhale 2 puffs into the lungs every 6 (six) hours as needed. For breathing  . albuterol (PROVENTIL) (2.5 MG/3ML) 0.083% nebulizer solution Take 2.5 mg by nebulization every 6 (six) hours as needed for wheezing or shortness of breath. Reported on 10/27/2015  . Ascorbic Acid (VITAMIN C) 1000 MG tablet Take 1,000 mg by mouth 2 (two) times daily.  . calcium-vitamin D (OSCAL WITH D) 500-200 MG-UNIT per tablet Take 1 tablet by mouth 2 (two) times daily.  . cetirizine (ZYRTEC) 10 MG tablet Take 10 mg by mouth daily.  . clobetasol ointment (TEMOVATE) 0.05 % as needed.  . COSENTYX 300 DOSE 150 MG/ML SOSY 300 mg every 28 (twenty-eight) days.   . cyclobenzaprine (FLEXERIL) 10 MG tablet Take 10 mg by mouth 2 (two) times daily as needed. For pain  . ferrous sulfate 325 (65 FE) MG tablet Take 325 mg by mouth daily with breakfast.  . furosemide (LASIX) 40 MG tablet as needed.   . gabapentin (NEURONTIN) 100 MG capsule Take 100 mg by mouth 3 (three) times daily.   Marland Kitchen glucosamine-chondroitin 500-400 MG tablet Take 1 tablet by mouth 2 (two) times daily.  . hydrOXYzine (ATARAX/VISTARIL) 10 MG tablet Take  10 mg by mouth at bedtime as needed for itching or anxiety. Reported on 10/27/2015  . levothyroxine (SYNTHROID, LEVOTHROID) 75 MCG tablet Take 75 mcg by mouth daily.  Marland Kitchen LORazepam (ATIVAN) 1 MG tablet Take 1 mg by mouth at bedtime.   . mometasone (NASONEX) 50 MCG/ACT nasal spray USE 2 SPRAYS IEN QD  . Multiple Vitamin (MULTIVITAMIN WITH MINERALS) TABS Take 1 tablet by mouth daily.  . pantoprazole (PROTONIX) 40 MG tablet Take 40 mg by mouth daily.   . rizatriptan (MAXALT-MLT) 10 MG disintegrating tablet TK 1 TA PO AT ONSET OF  SYMPTOMS AND THEN MAY REPEAT IN 2 HOURS PRN  . Radiation protection practitioner (BD SHARPS COLLECTOR SMALL) [place] See admin instructions.  . SYMBICORT 160-4.5 MCG/ACT inhaler Inhale 2 puffs into the lungs 2 (two) times daily.  Marland Kitchen topiramate (TOPAMAX) 100 MG tablet Take 100 mg by mouth at bedtime.   Marland Kitchen VITAMIN E PO Take 400 mg by mouth 2 (two) times daily.    Facility-Administered Encounter Medications as of 08/16/2018  Medication  . 0.9 %  sodium chloride infusion   Allergies  Allergen Reactions  . Amoxicillin Itching  . Latex Itching  . Percocet [Oxycodone-Acetaminophen] Itching  . Plaquenil [Hydroxychloroquine Sulfate]     "Psoriasis."  . Bactrim [Sulfamethoxazole-Trimethoprim] Rash  . Ciprofloxacin Hcl Rash  . Keflex [Cephalexin] Rash  . Penicillins Rash  . Tetracyclines & Related Rash   Patient Active Problem List   Diagnosis Date Noted  . Cancer of nasopharynx ([place]) 01/10/2017  . History of asthma 01/10/2017  . Psoriatic arthritis ([place]) 08/10/2016  . High risk medication use 08/10/2016  . Chronic pain syndrome 08/22/2013  . Pulmonary infiltrates 06/30/2013  . Psoriasis   . GERD (gastroesophageal reflux disease)   . Hypothyroidism   . Pleurisy 06/19/2013  . Caries 03/04/2013  . Nephrolithiasis 04/05/2012  . Hydronephrosis 04/05/2012  . UTI (lower urinary tract infection) 04/05/2012  . On prednisone therapy 04/05/2012  . Positive ANA (antinuclear antibody) 04/05/2012   Social History   Socioeconomic History  . Marital status: Divorced    Spouse name: Not on file  . Number of children: Not on file  . Years of education: Not on file  . Highest education level: Not on file  Occupational History  . Not on file  Social Needs  . Financial resource strain: Not on file  . Food insecurity:    Worry: Not on file    Inability: Not on file  . Transportation needs:    Medical: Not on file    Non-medical: Not on file  Tobacco Use  . Smoking status: Former Smoker    Packs/day: 0.25     Years: 8.00    Pack years: 2.00    Types: Cigarettes    Last attempt to quit: 02/28/2004    Years since quitting: 14.4  . Smokeless tobacco: Never Used  Substance and Sexual Activity  . Alcohol use: Not Currently    Alcohol/week: 0.0 standard drinks  . Drug use: No  . Sexual activity: Yes    Partners: Male    Birth control/protection: OCP, Pill  Lifestyle  . Physical activity:    Days per week: Not on file    Minutes per session: Not on file  . Stress: Not on file  Relationships  . Social connections:    Talks on phone: Not on file    Gets together: Not on file    Attends religious service: Not on file    Active member of  club or organization: Not on file    Attends meetings of clubs or organizations: Not on file    Relationship status: Not on file  . Intimate partner violence:    Fear of current or ex partner: Not on file    Emotionally abused: Not on file    Physically abused: Not on file    Forced sexual activity: Not on file  Other Topics Concern  . Not on file  Social History Narrative  . Not on file    Ms. Dolle's family history includes Breast cancer (age of onset: 69) in her maternal grandmother; Diabetes in her mother; Hypertension in her father and mother; Lupus in her brother.      Objective:    Vitals:   08/16/18 1448  BP: 120/74  Pulse: 72    Physical Exam; well-developed older African-American female, in no acute distress.  Pleasant, height 5 foot 6, weight 161, BMI 26.0.  HEENT; nontraumatic normocephalic EOMI PERRLA sclera anicteric oral mucosa moist, she status post previous pharyngeal CA.  Cardiovascular ;regular rate and rhythm with S1-S2 no murmur rub or gallop, Pulmonary ;clear bilaterally, Abdomen; soft, bowel sounds are present, there is no definite palpable mass or hepatosplenomegaly.  She is tender in the right mid, right lower quadrant, and epigastrium, no guarding.  Rectal; exam not done.  Extremities; no clubbing cyanosis or edema skin warm  and dry, Neuropsych; alert and oriented, grossly nonfocal mood and affect appropriate       Assessment & Plan:   #17 53 year old African-American female with weight loss of 15 pounds over the past year, poor appetite, and on exam has significant tenderness right abdomen and epigastrium. Etiology of symptoms is not clear.  Rule out occult intra-abdominal inflammatory process or malignancy.  Rule out possible vasculitis with history of lupus Patient is also been on Cosentyx for psoriatic arthritis can be associated with GI symptoms including weight loss anorexia etc. Patient is also on Topamax and associated with recent appetite and weight loss  #2 psoriatic arthritis #3.  Lupus #4.  Chronic pain syndrome #5.  History of nasopharyngeal cancer remote #6.  Diverticulosis  #7 GERD stable on Protonix  Plan recent CBC unremarkable, check c-Met and TSH Schedule for CT scan of the abdomen and pelvis with contrast Chest x-ray PA and lateral Continue Protonix 40 mg once daily. Further recommendations pending results of above.    Hadli Vandemark S Evangelyne Loja PA-C 08/16/2018   Cc: Nolene Ebbs, MD  Addendum: Reviewed and agree with assessment and management plan. Pyrtle, Lajuan Lines, MD

## 2018-08-17 LAB — TSH: TSH: 1.27 u[IU]/mL (ref 0.35–4.50)

## 2018-08-18 ENCOUNTER — Telehealth: Payer: Self-pay | Admitting: Physician Assistant

## 2018-08-18 NOTE — Telephone Encounter (Signed)
Pt called and wanting to speak with nurse about lab work she did on 08/16/2018

## 2018-08-21 ENCOUNTER — Ambulatory Visit (HOSPITAL_COMMUNITY)
Admission: RE | Admit: 2018-08-21 | Discharge: 2018-08-21 | Disposition: A | Discharge status: Home / Self Care | Payer: Medicare Other | Source: Ambulatory Visit | Attending: Internal Medicine | Admitting: Internal Medicine

## 2018-08-21 ENCOUNTER — Other Ambulatory Visit: Payer: Self-pay

## 2018-08-21 ENCOUNTER — Ambulatory Visit (HOSPITAL_COMMUNITY)
Admission: RE | Admit: 2018-08-21 | Discharge: 2018-08-21 | Disposition: A | Discharge status: Home / Self Care | Payer: Medicare Other | Source: Ambulatory Visit | Attending: Physician Assistant | Admitting: Physician Assistant

## 2018-08-21 DIAGNOSIS — R1031 Right lower quadrant pain: Secondary | ICD-10-CM

## 2018-08-21 DIAGNOSIS — R630 Anorexia: Secondary | ICD-10-CM | POA: Diagnosis present

## 2018-08-21 DIAGNOSIS — R1013 Epigastric pain: Secondary | ICD-10-CM

## 2018-08-21 DIAGNOSIS — R109 Unspecified abdominal pain: Secondary | ICD-10-CM

## 2018-08-21 DIAGNOSIS — R634 Abnormal weight loss: Secondary | ICD-10-CM

## 2018-08-21 MED ORDER — SODIUM CHLORIDE (PF) 0.9 % IJ SOLN
INTRAMUSCULAR | Status: AC
  Filled 2018-08-21: qty 50

## 2018-08-21 MED ORDER — IOHEXOL 300 MG/ML  SOLN
100.0000 mL | Freq: Once | INTRAMUSCULAR | Status: AC | PRN
  Administered 2018-08-21: 100 mL via INTRAVENOUS

## 2018-08-22 ENCOUNTER — Telehealth: Payer: Self-pay

## 2018-08-22 NOTE — Telephone Encounter (Signed)
CT scan of the abdomen pelvis with contrast was performed to evaluate weight loss poor appetite There is no evidence of malignancy and no clear cause for weight loss Colonic diverticulosis without inflammation Aortic atherosclerosis  Chest x-ray shows overall low lung volumes but no evidence for pulmonary lesions or effusion/fluid collection  I would recommend that she follow back with her primary care provider and rheumatologist

## 2018-08-22 NOTE — Telephone Encounter (Signed)
Would like to know her lab and imaging results. Can you review and advise in [name]'s absence?

## 2018-08-22 NOTE — Telephone Encounter (Signed)
Spoke with the patient.  She is advised of her results and the recommendations. She asks I route her imaging results to her PCP for his review.

## 2018-08-22 NOTE — Telephone Encounter (Signed)
No answer

## 2018-08-24 ENCOUNTER — Other Ambulatory Visit: Payer: Self-pay

## 2018-08-24 ENCOUNTER — Emergency Department (HOSPITAL_COMMUNITY): Payer: Medicare Other

## 2018-08-24 ENCOUNTER — Encounter (HOSPITAL_COMMUNITY): Payer: Self-pay

## 2018-08-24 ENCOUNTER — Emergency Department (HOSPITAL_COMMUNITY)
Admission: EM | Admit: 2018-08-24 | Discharge: 2018-08-24 | Disposition: A | Discharge status: Home / Self Care | Payer: Medicare Other | Attending: Emergency Medicine | Admitting: Emergency Medicine

## 2018-08-24 DIAGNOSIS — R10814 Left lower quadrant abdominal tenderness: Secondary | ICD-10-CM | POA: Diagnosis not present

## 2018-08-24 DIAGNOSIS — Y939 Activity, unspecified: Secondary | ICD-10-CM | POA: Diagnosis not present

## 2018-08-24 DIAGNOSIS — E039 Hypothyroidism, unspecified: Secondary | ICD-10-CM | POA: Insufficient documentation

## 2018-08-24 DIAGNOSIS — M542 Cervicalgia: Secondary | ICD-10-CM | POA: Diagnosis not present

## 2018-08-24 DIAGNOSIS — J449 Chronic obstructive pulmonary disease, unspecified: Secondary | ICD-10-CM | POA: Diagnosis not present

## 2018-08-24 DIAGNOSIS — R10813 Right lower quadrant abdominal tenderness: Secondary | ICD-10-CM | POA: Insufficient documentation

## 2018-08-24 DIAGNOSIS — R0789 Other chest pain: Secondary | ICD-10-CM | POA: Diagnosis present

## 2018-08-24 DIAGNOSIS — Y999 Unspecified external cause status: Secondary | ICD-10-CM | POA: Insufficient documentation

## 2018-08-24 DIAGNOSIS — M3210 Systemic lupus erythematosus, organ or system involvement unspecified: Secondary | ICD-10-CM | POA: Insufficient documentation

## 2018-08-24 DIAGNOSIS — M546 Pain in thoracic spine: Secondary | ICD-10-CM | POA: Insufficient documentation

## 2018-08-24 DIAGNOSIS — R10815 Periumbilic abdominal tenderness: Secondary | ICD-10-CM | POA: Insufficient documentation

## 2018-08-24 DIAGNOSIS — M549 Dorsalgia, unspecified: Secondary | ICD-10-CM

## 2018-08-24 DIAGNOSIS — T148XXA Other injury of unspecified body region, initial encounter: Secondary | ICD-10-CM

## 2018-08-24 DIAGNOSIS — R0781 Pleurodynia: Secondary | ICD-10-CM | POA: Diagnosis not present

## 2018-08-24 DIAGNOSIS — M545 Low back pain: Secondary | ICD-10-CM | POA: Diagnosis not present

## 2018-08-24 DIAGNOSIS — Z79899 Other long term (current) drug therapy: Secondary | ICD-10-CM | POA: Diagnosis not present

## 2018-08-24 DIAGNOSIS — Y929 Unspecified place or not applicable: Secondary | ICD-10-CM | POA: Insufficient documentation

## 2018-08-24 DIAGNOSIS — M7918 Myalgia, other site: Secondary | ICD-10-CM

## 2018-08-24 LAB — CBC WITH DIFFERENTIAL/PLATELET
Abs Immature Granulocytes: 0.05 10*3/uL (ref 0.00–0.07)
BASOS ABS: 0 10*3/uL (ref 0.0–0.1)
Basophils Relative: 1 %
Eosinophils Absolute: 0.1 10*3/uL (ref 0.0–0.5)
Eosinophils Relative: 1 %
HEMATOCRIT: 36.6 % (ref 36.0–46.0)
Hemoglobin: 11.9 g/dL — ABNORMAL LOW (ref 12.0–15.0)
Immature Granulocytes: 1 %
Lymphocytes Relative: 30 %
Lymphs Abs: 2.3 10*3/uL (ref 0.7–4.0)
MCH: 29.5 pg (ref 26.0–34.0)
MCHC: 32.5 g/dL (ref 30.0–36.0)
MCV: 90.6 fL (ref 80.0–100.0)
Monocytes Absolute: 0.6 10*3/uL (ref 0.1–1.0)
Monocytes Relative: 7 %
NEUTROS PCT: 60 %
Neutro Abs: 4.7 10*3/uL (ref 1.7–7.7)
Platelets: 247 10*3/uL (ref 150–400)
RBC: 4.04 MIL/uL (ref 3.87–5.11)
RDW: 12.6 % (ref 11.5–15.5)
WBC: 7.7 10*3/uL (ref 4.0–10.5)
nRBC: 0 % (ref 0.0–0.2)

## 2018-08-24 LAB — COMPREHENSIVE METABOLIC PANEL
ALT: 12 U/L (ref 0–44)
ANION GAP: 11 (ref 5–15)
AST: 22 U/L (ref 15–41)
Albumin: 4 g/dL (ref 3.5–5.0)
Alkaline Phosphatase: 63 U/L (ref 38–126)
BUN: 10 mg/dL (ref 6–20)
CO2: 23 mmol/L (ref 22–32)
Calcium: 10.1 mg/dL (ref 8.9–10.3)
Chloride: 109 mmol/L (ref 98–111)
Creatinine, Ser: 1 mg/dL (ref 0.44–1.00)
GFR calc non Af Amer: >60 mL/min (ref >60)
Glucose, Bld: 96 mg/dL (ref 70–99)
Potassium: 3.6 mmol/L (ref 3.5–5.1)
SODIUM: 143 mmol/L (ref 135–145)
Total Bilirubin: 0.6 mg/dL (ref 0.3–1.2)
Total Protein: 7.6 g/dL (ref 6.5–8.1)

## 2018-08-24 MED ORDER — IOHEXOL 300 MG/ML  SOLN
100.0000 mL | Freq: Once | INTRAMUSCULAR | Status: AC | PRN
  Administered 2018-08-24: 100 mL via INTRAVENOUS

## 2018-08-24 MED ORDER — MORPHINE SULFATE (PF) 4 MG/ML IV SOLN
4.0000 mg | Freq: Once | INTRAVENOUS | Status: AC
  Administered 2018-08-24: 4 mg via INTRAVENOUS
  Filled 2018-08-24: qty 1

## 2018-08-24 MED ORDER — FENTANYL CITRATE (PF) 100 MCG/2ML IJ SOLN
100.0000 ug | Freq: Once | INTRAMUSCULAR | Status: AC
  Administered 2018-08-24: 100 ug via INTRAVENOUS
  Filled 2018-08-24: qty 2

## 2018-08-24 MED ORDER — METHOCARBAMOL 500 MG PO TABS: 500.0000 mg | ORAL_TABLET | Freq: Two times a day (BID) | ORAL | 0 refills | Status: DC

## 2018-08-24 MED ORDER — ONDANSETRON HCL 4 MG/2ML IJ SOLN
4.0000 mg | Freq: Once | INTRAMUSCULAR | Status: AC
  Administered 2018-08-24: 4 mg via INTRAVENOUS
  Filled 2018-08-24: qty 2

## 2018-08-24 NOTE — Discharge Instructions (Signed)

## 2018-08-24 NOTE — ED Notes (Signed)
Pt appeared to be doing fine during ambulation but staed upon arriving back at the room she is still concerned because she feels like her chest is swollen.
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
## 2018-08-24 NOTE — ED Notes (Signed)
ED Provider at bedside. 

## 2018-08-24 NOTE — ED Notes (Signed)
Patient verbalizes understanding of discharge instructions. Opportunity for questioning and answers were provided. Armband removed by staff, pt discharged from ED.  

## 2018-09-28 IMAGING — MG DIGITAL SCREENING BILATERAL MAMMOGRAM WITH TOMO AND CAD
6 series · 6 of 14 positions shown · non-contrast
Comparison: Previous exam(s).

CLINICAL DATA: Screening.

EXAM:
DIGITAL SCREENING BILATERAL MAMMOGRAM WITH TOMO AND CAD

[L CC synth-2D]
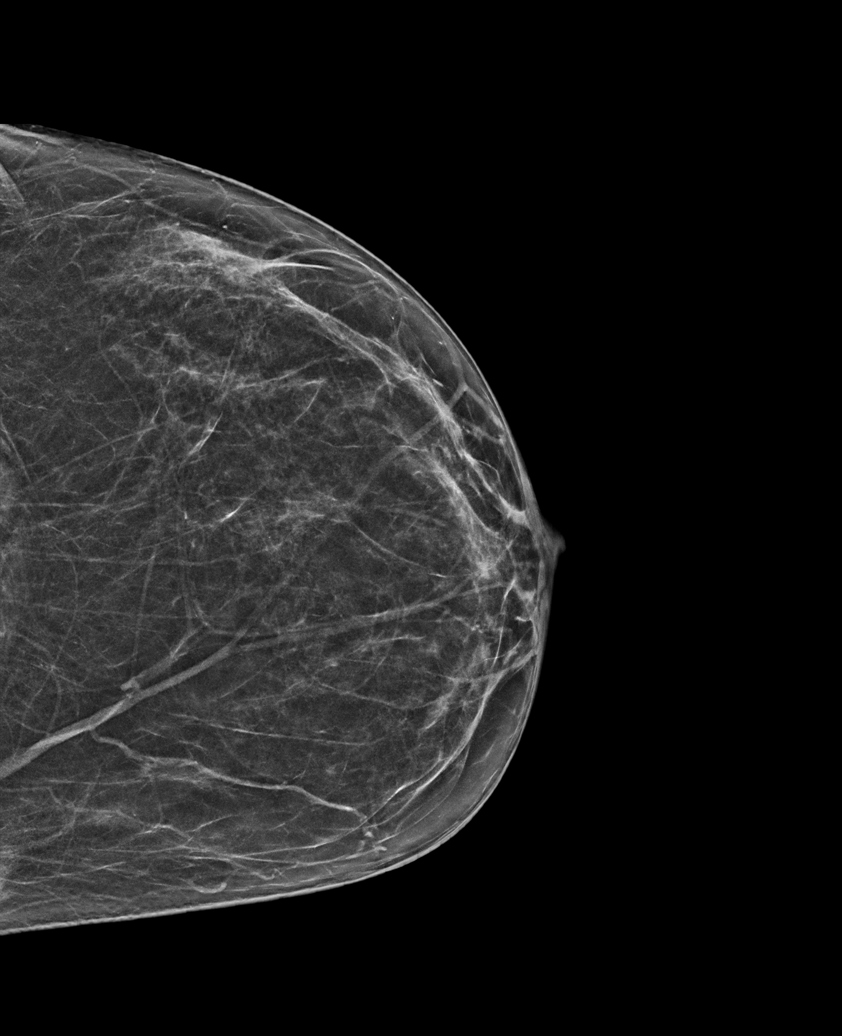

[R MLO synth-2D]
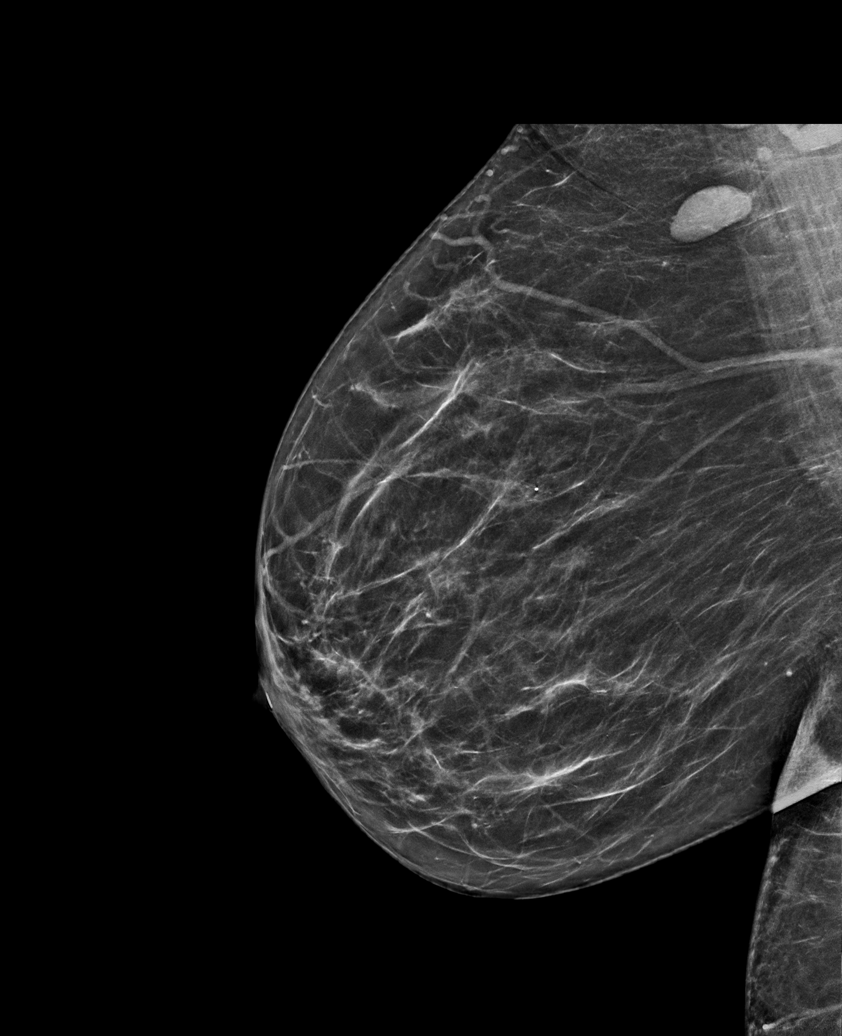

[R CC synth-2D]
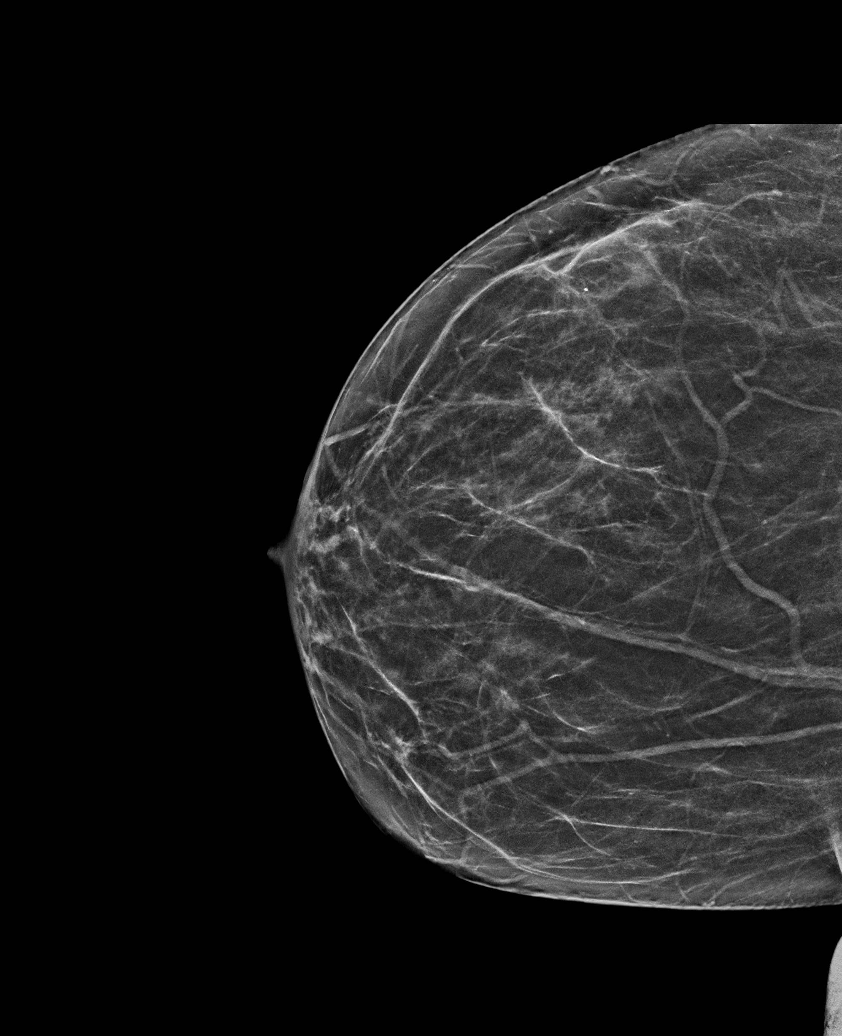

[L MLO synth-2D]
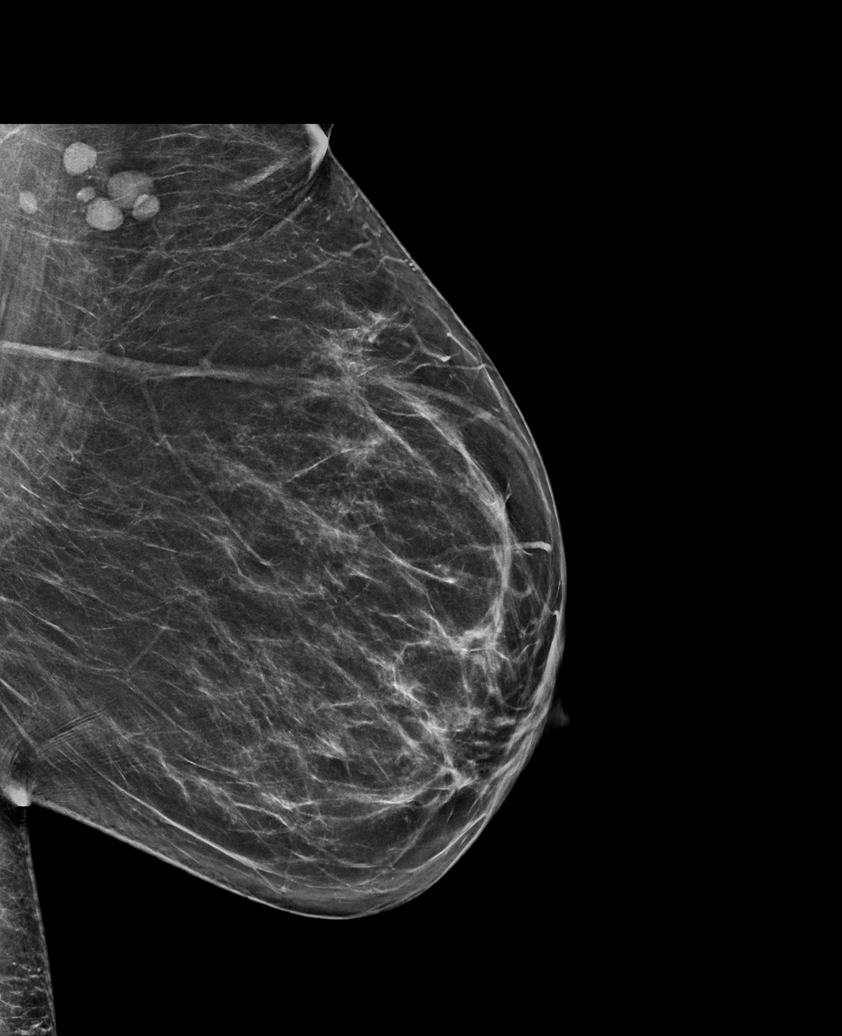

[R MLO tomo · tomo slice 35/69.0]
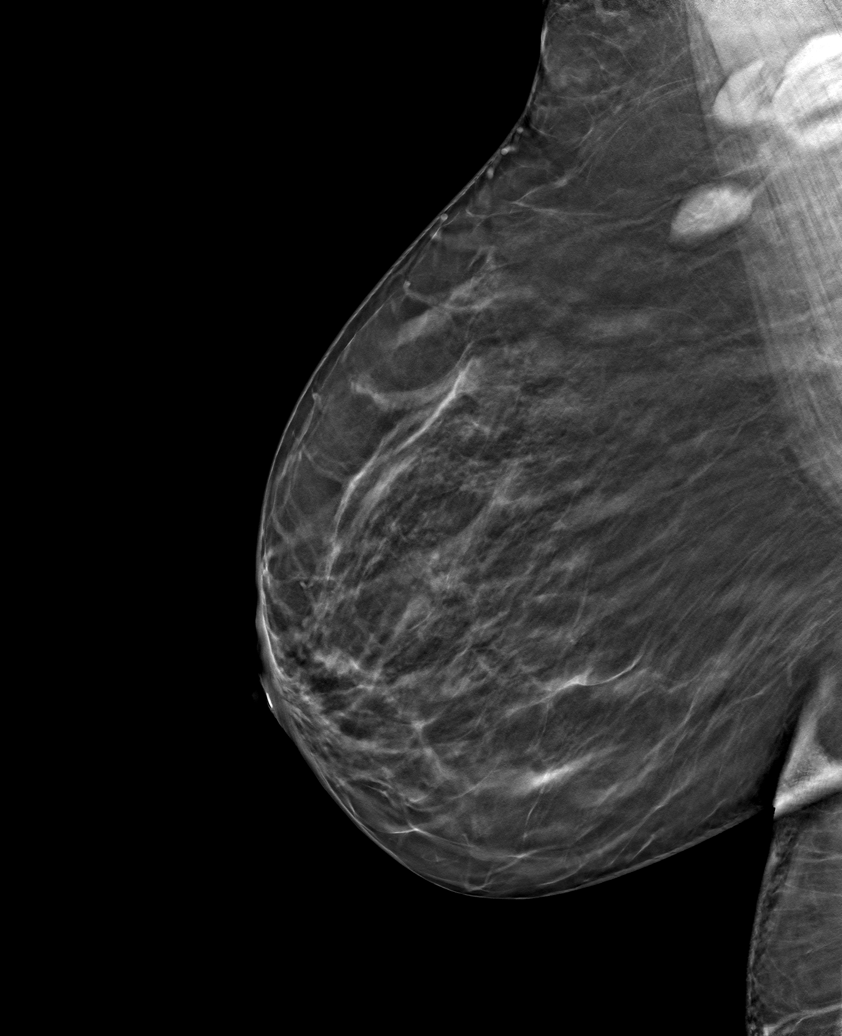

[L CC tomo · tomo slice 30/59.0]
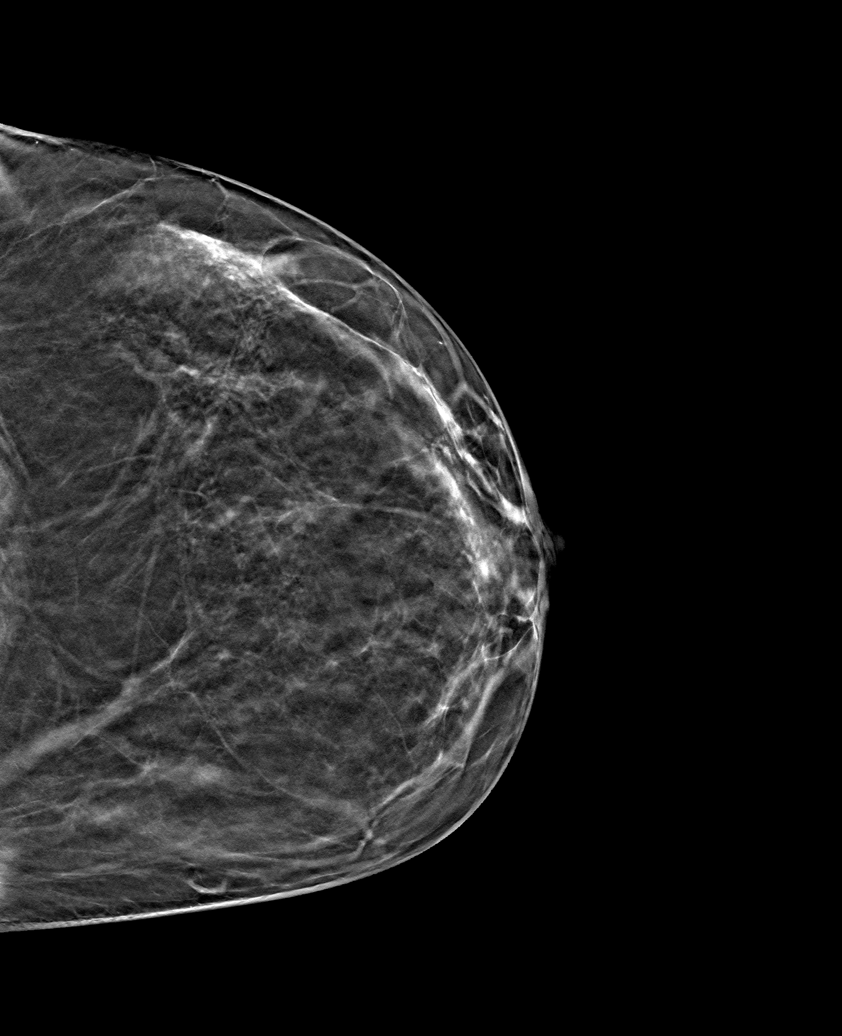

[6 of 14 positions shown; findings below may reference images not displayed]

ACR Breast Density Category b: There are scattered areas of
fibroglandular density.
FINDINGS: There are no findings suspicious for malignancy. Images were
processed with CAD.
IMPRESSION: No mammographic evidence of malignancy. A result letter of this
screening mammogram will be mailed directly to the patient.

RECOMMENDATION:
Screening mammogram in one year. (Code:[id number])

BI-RADS CATEGORY  1: Negative.

## 2019-01-10 NOTE — Progress Notes (Deleted)
Office Visit Note  Patient: [name]             Date of Birth: 13-Sep-1965           MRN: [id number]             PCP: Nolene Ebbs, MD Referring: Nolene Ebbs, MD Visit Date: 01/24/2019 Occupation: @GUAROCC @  Subjective:  No chief complaint on file.   Cosentyx 300 mg every 28 days.  Last TB gold negative on 11/17/2017.  Due for TB gold today and will monitor yearly.  Most recent CBC/CMP within normal limits except for low potassium on 01/17/2019 during hospital encounter.  Due for CBC/CMP at the end of October and will monitor every 3 months.  Standing orders are in place.  Recommend annual influenza, Pneumovax 23, Prevnar 13, and Shingrix as indicated for immunosuppressant therapy.    History of Present Illness: [name] is a 53 y.o. female ***   Activities of Daily Living:  Patient reports morning stiffness for *** {minute/hour:19697}.   Patient {ACTIONS;DENIES/REPORTS:21021675::"Denies"} nocturnal pain.  Difficulty dressing/grooming: {ACTIONS;DENIES/REPORTS:21021675::"Denies"} Difficulty climbing stairs: {ACTIONS;DENIES/REPORTS:21021675::"Denies"} Difficulty getting out of chair: {ACTIONS;DENIES/REPORTS:21021675::"Denies"} Difficulty using hands for taps, buttons, cutlery, and/or writing: {ACTIONS;DENIES/REPORTS:21021675::"Denies"}  No Rheumatology ROS completed.   PMFS History:  Patient Active Problem List   Diagnosis Date Noted  . 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 History of asthma 01/10/2017  . Psoriatic arthritis ([place]) 08/10/2016  . High risk medication use 08/10/2016  . Chronic pain syndrome 08/22/2013  . Pulmonary infiltrates 06/30/2013  . Psoriasis   . GERD (gastroesophageal reflux disease)   . Hypothyroidism   . Pleurisy 06/19/2013  . Caries 03/04/2013  . Nephrolithiasis 04/05/2012  . Hydronephrosis 04/05/2012  . UTI (lower urinary tract infection) 04/05/2012  . On prednisone therapy 04/05/2012  . Positive ANA  (antinuclear antibody) 04/05/2012    Past Medical History:  Diagnosis Date  . Allergy   . Anxiety   . Arthritis    ra, lupus - ankles, knees, hands, neck  . Blood transfusion without reported diagnosis [place] with c/s surgery  . Bronchitis    Hx  . Cancer (HCC)    nasopharynx  . COPD (chronic obstructive pulmonary disease) ([place])   . DDD (degenerative disc disease), lumbar   . Diabetes mellitus without complication ([place])    related to prednisone use, no meds,cbg normal  . Diverticulosis   . Excessive or frequent menstruation   . GERD (gastroesophageal reflux disease)   . Headache(784.0)   . Hypothyroidism   . Irregular menstrual cycle   . Leiomyoma of uterus, unspecified   . Lupus ([place])   . Migraines   . Neuromuscular disorder (HCC)    neuropathy in legs/feet  . Neuropathy   . Papanicolaou smear of cervix with atypical squamous cells of undetermined significance (ASC-US)   . Psoriasis   . Sinusitis   . Trichimoniasis   . Wears partial dentures    full upper and lower partial    Family History  Problem Relation Age of Onset  . Lupus Brother   . Breast cancer Maternal Grandmother 30  . Hypertension Mother   . Diabetes Mother   . Hypertension Father   . Colon cancer Neg Hx   . Esophageal cancer Neg Hx   . Rectal cancer Neg Hx   . Stomach cancer Neg Hx    Past Surgical History:  Procedure Laterality Date  . ANKLE ARTHROSCOPY Right   . CESAREAN SECTION  x 1  . DILATION AND CURETTAGE OF UTERUS     x 1 Missed Abortion  . LYMPH NODE BIOPSY     neck  . MULTIPLE EXTRACTIONS WITH ALVEOLOPLASTY N/A 03/05/2013   Procedure: MULTIPLE EXTRACION WITH ALVEOLOPLASTY, extraction of decayed teeth numbers 3,4,5,18,19,22,23,24,25, extraction of retained root tips teeth numbers 2,20,21,26, bilateral mandibular alveoloplasty and upper right maxillary alveoloplasty;  Surgeon: Isac Caddy, DDS;  Location: [place];  Service: Oral Surgery;  Laterality: N/A;  . scalp  biopsy    . TOOTH EXTRACTION Bilateral 03/05/2013   Procedure: EXTRACTION MOLARS;  Surgeon: Isac Caddy, DDS;  Location: [place];  Service: Oral Surgery;  Laterality: Bilateral;   Social History   Social History Narrative  . Not on file   Immunization History  Administered Date(s) Administered  . Influenza Split 02/27/2013, 03/21/2014  . PPD Test 06/25/2015, 04/29/2016, 01/12/2017     Objective: Vital Signs: LMP 06/25/2016    Physical Exam   Musculoskeletal Exam: ***  CDAI Exam: CDAI Score: - Patient Global: -; Provider Global: - Swollen: -; Tender: - Joint Exam   No joint exam has been documented for this visit   There is currently no information documented on the homunculus. Go to the Rheumatology activity and complete the homunculus joint exam.  Investigation: No additional findings.  Imaging: No results found.  Recent Labs: Lab Results  Component Value Date   WBC 7.7 08/24/2018   HGB 11.9 (L) 08/24/2018   PLT 247 08/24/2018   NA 143 08/24/2018   K 3.6 08/24/2018   CL 109 08/24/2018   CO2 23 08/24/2018   GLUCOSE 96 08/24/2018   BUN 10 08/24/2018   CREATININE 1.00 08/24/2018   BILITOT 0.6 08/24/2018   ALKPHOS 63 08/24/2018   AST 22 08/24/2018   ALT 12 08/24/2018   PROT 7.6 08/24/2018   ALBUMIN 4.0 08/24/2018   CALCIUM 10.1 08/24/2018   GFRAA >60 08/24/2018   QFTBGOLDPLUS NEGATIVE 10/21/2017    Speciality Comments: No specialty comments available.  Procedures:  No procedures performed Allergies: Amoxicillin, Latex, Percocet [oxycodone-acetaminophen], Plaquenil [hydroxychloroquine sulfate], Bactrim [sulfamethoxazole-trimethoprim], Ciprofloxacin hcl, Keflex [cephalexin], Penicillins, and Tetracyclines & related   Assessment / Plan:     Visit Diagnoses: No diagnosis found.  Orders: No orders of the defined types were placed in this encounter.  No orders of the defined types were placed in this encounter.   Face-to-face time spent with  patient was *** minutes. Greater than 50% of time was spent in counseling and coordination of care.  Follow-Up Instructions: No follow-ups on file.   Earnestine Mealing, CMA  Note - This record has been created using Editor, commissioning.  Chart creation errors have been sought, but may not always  have been located. Such creation errors do not reflect on  the standard of medical care.

## 2019-01-12 ENCOUNTER — Emergency Department (HOSPITAL_COMMUNITY)
Admission: EM | Admit: 2019-01-12 | Discharge: 2019-01-12 | Disposition: A | Discharge status: Home / Self Care | Payer: Medicare Other | Attending: Emergency Medicine | Admitting: Emergency Medicine

## 2019-01-12 ENCOUNTER — Emergency Department (HOSPITAL_COMMUNITY): Payer: Medicare Other

## 2019-01-12 DIAGNOSIS — E039 Hypothyroidism, unspecified: Secondary | ICD-10-CM | POA: Insufficient documentation

## 2019-01-12 DIAGNOSIS — Z87891 Personal history of nicotine dependence: Secondary | ICD-10-CM | POA: Insufficient documentation

## 2019-01-12 DIAGNOSIS — J449 Chronic obstructive pulmonary disease, unspecified: Secondary | ICD-10-CM | POA: Diagnosis not present

## 2019-01-12 DIAGNOSIS — I10 Essential (primary) hypertension: Secondary | ICD-10-CM

## 2019-01-12 DIAGNOSIS — Z91040 Latex allergy status: Secondary | ICD-10-CM | POA: Diagnosis not present

## 2019-01-12 DIAGNOSIS — E119 Type 2 diabetes mellitus without complications: Secondary | ICD-10-CM | POA: Diagnosis not present

## 2019-01-12 DIAGNOSIS — R0789 Other chest pain: Secondary | ICD-10-CM | POA: Diagnosis not present

## 2019-01-12 DIAGNOSIS — Z79899 Other long term (current) drug therapy: Secondary | ICD-10-CM | POA: Diagnosis not present

## 2019-01-12 DIAGNOSIS — R079 Chest pain, unspecified: Secondary | ICD-10-CM

## 2019-01-12 DIAGNOSIS — R0602 Shortness of breath: Secondary | ICD-10-CM | POA: Insufficient documentation

## 2019-01-12 DIAGNOSIS — Z85818 Personal history of malignant neoplasm of other sites of lip, oral cavity, and pharynx: Secondary | ICD-10-CM | POA: Insufficient documentation

## 2019-01-12 DIAGNOSIS — G43001 Migraine without aura, not intractable, with status migrainosus: Secondary | ICD-10-CM

## 2019-01-12 LAB — BASIC METABOLIC PANEL
Anion gap: 10 (ref 5–15)
BUN: 8 mg/dL (ref 6–20)
CO2: 24 mmol/L (ref 22–32)
Calcium: 9.8 mg/dL (ref 8.9–10.3)
Chloride: 107 mmol/L (ref 98–111)
Creatinine, Ser: 0.81 mg/dL (ref 0.44–1.00)
GFR calc Af Amer: >60 mL/min (ref >60)
GFR calc non Af Amer: >60 mL/min (ref >60)
Glucose, Bld: 99 mg/dL (ref 70–99)
Potassium: 3.3 mmol/L — ABNORMAL LOW (ref 3.5–5.1)
Sodium: 141 mmol/L (ref 135–145)

## 2019-01-12 LAB — CBC WITH DIFFERENTIAL/PLATELET
Abs Immature Granulocytes: 0.02 10*3/uL (ref 0.00–0.07)
Basophils Absolute: 0 10*3/uL (ref 0.0–0.1)
Basophils Relative: 1 %
Eosinophils Absolute: 0.1 10*3/uL (ref 0.0–0.5)
Eosinophils Relative: 1 %
HCT: 36.4 % (ref 36.0–46.0)
Hemoglobin: 12 g/dL (ref 12.0–15.0)
Immature Granulocytes: 0 %
Lymphocytes Relative: 37 %
Lymphs Abs: 3.1 10*3/uL (ref 0.7–4.0)
MCH: 31 pg (ref 26.0–34.0)
MCHC: 33 g/dL (ref 30.0–36.0)
MCV: 94.1 fL (ref 80.0–100.0)
Monocytes Absolute: 0.5 10*3/uL (ref 0.1–1.0)
Monocytes Relative: 6 %
Neutro Abs: 4.7 10*3/uL (ref 1.7–7.7)
Neutrophils Relative %: 55 %
Platelets: 307 10*3/uL (ref 150–400)
RBC: 3.87 MIL/uL (ref 3.87–5.11)
RDW: 12.4 % (ref 11.5–15.5)
WBC: 8.5 10*3/uL (ref 4.0–10.5)
nRBC: 0 % (ref 0.0–0.2)

## 2019-01-12 LAB — TROPONIN I (HIGH SENSITIVITY)
Troponin I (High Sensitivity): 17 ng/L (ref <18)
Troponin I (High Sensitivity): 25 ng/L — ABNORMAL HIGH (ref <18)

## 2019-01-12 MED ORDER — SODIUM CHLORIDE 0.9 % IV BOLUS (SEPSIS)
1000.0000 mL | Freq: Once | INTRAVENOUS | Status: AC
  Administered 2019-01-12: 1000 mL via INTRAVENOUS

## 2019-01-12 MED ORDER — IOHEXOL 350 MG/ML SOLN
75.0000 mL | Freq: Once | INTRAVENOUS | Status: AC | PRN
  Administered 2019-01-12: 75 mL via INTRAVENOUS

## 2019-01-12 MED ORDER — DIPHENHYDRAMINE HCL 50 MG/ML IJ SOLN
25.0000 mg | Freq: Once | INTRAMUSCULAR | Status: AC
  Administered 2019-01-12: 25 mg via INTRAVENOUS
  Filled 2019-01-12: qty 1

## 2019-01-12 MED ORDER — KETOROLAC TROMETHAMINE 30 MG/ML IJ SOLN
30.0000 mg | Freq: Once | INTRAMUSCULAR | Status: AC
  Administered 2019-01-12: 30 mg via INTRAVENOUS
  Filled 2019-01-12: qty 1

## 2019-01-12 MED ORDER — FENTANYL CITRATE (PF) 100 MCG/2ML IJ SOLN
75.0000 ug | Freq: Once | INTRAMUSCULAR | Status: AC
  Administered 2019-01-12: 75 ug via INTRAVENOUS
  Filled 2019-01-12: qty 2

## 2019-01-12 MED ORDER — METOCLOPRAMIDE HCL 5 MG/ML IJ SOLN
10.0000 mg | Freq: Once | INTRAMUSCULAR | Status: AC
Start: 2019-01-12 — End: 2019-01-12
  Administered 2019-01-12: 10 mg via INTRAVENOUS
  Filled 2019-01-12: qty 2

## 2019-01-12 MED ORDER — AMLODIPINE BESYLATE 5 MG PO TABS
5.0000 mg | ORAL_TABLET | Freq: Once | ORAL | Status: AC
  Administered 2019-01-12: 5 mg via ORAL
  Filled 2019-01-12: qty 1

## 2019-01-12 NOTE — ED Provider Notes (Signed)
TIME SEEN: 5:54 AM  CHIEF COMPLAINT: Multiple complaints  HPI: Patient is a 53 year old female with history of nasopharyngeal cancer status post radiation therapy in remission for 14 years, COPD, psoriasis, rheumatoid arthritis, migraine headaches who presents to the emergency department with multiple complaints.  She complains of intermittent frontal throbbing headache for the past several days.  States this feels like her migraine headaches.  Worse with lights.  Has had some nausea but no vomiting.  No head injury.  Not on antiplatelets or anticoagulants.  No numbness, tingling or focal weakness.  Took her Topamax at home without relief.   Patient also complains of chest pain that has been intermittent for the past 2 weeks.  She describes it as a tightness in her chest and a soreness and feels like it is musculoskeletal.  It is in the left side of her chest and will radiate into her back.  She states it causes her to feel short of breath.  It is worse with palpation but also with deep inspiration.  She states that she feels like the soft tissues around her clavicles and the center of her chest have been swollen and states that she was scheduled to have an outpatient CT scan done by her PCP.  She denies any fevers or cough.  No lower extremity swelling or pain.  No history of CAD.  Symptoms are not worse with exertion.  She is having some of the tightness and discomfort currently.  She does state that the pain is worse with movement.  ROS: See HPI Constitutional: no fever  Eyes: no drainage  ENT: no runny nose   Cardiovascular:   chest pain  Resp:  SOB  GI: no vomiting GU: no dysuria Integumentary: no rash  Allergy: no hives  Musculoskeletal: no leg swelling  Neurological: no slurred speech ROS otherwise negative  PAST MEDICAL HISTORY/PAST SURGICAL HISTORY:  Past Medical History:  Diagnosis Date  . Allergy   . Anxiety   . Arthritis    ra, lupus - ankles, knees, hands, neck  . Blood  transfusion without reported diagnosis [place] with c/s surgery  . Bronchitis    Hx  . Cancer (HCC)    nasopharynx  . COPD (chronic obstructive pulmonary disease) ([place])   . DDD (degenerative disc disease), lumbar   . Diabetes mellitus without complication ([place])    related to prednisone use, no meds,cbg normal  . Diverticulosis   . Excessive or frequent menstruation   . GERD (gastroesophageal reflux disease)   . Headache(784.0)   . Hypothyroidism   . Irregular menstrual cycle   . Leiomyoma of uterus, unspecified   . Lupus ([place])   . Migraines   . Neuromuscular disorder (HCC)    neuropathy in legs/feet  . Neuropathy   . Papanicolaou smear of cervix with atypical squamous cells of undetermined significance (ASC-US)   . Psoriasis   . Sinusitis   . Trichimoniasis   . Wears partial dentures    full upper and lower partial    MEDICATIONS:  Prior to Admission medications   Medication Sig Start Date End Date Taking? Authorizing Provider  acetaminophen (TYLENOL) 325 MG tablet Take 650 mg by mouth every 6 (six) hours as needed for mild pain or headache. Reported on 10/27/2015    [provider]  albuterol (PROVENTIL HFA;VENTOLIN HFA) 108 (90 BASE) MCG/ACT inhaler Inhale 2 puffs into the lungs every 6 (six) hours as needed for wheezing or shortness of breath.  [provider]  albuterol (PROVENTIL) (2.5 MG/3ML) 0.083% nebulizer solution Take 2.5 mg by nebulization every 6 (six) hours as needed for wheezing or shortness of breath. Reported on 10/27/2015    [provider]  Ascorbic Acid (VITAMIN C) 1000 MG tablet Take 1,000 mg by mouth 2 (two) times daily.    [provider]  calcium-vitamin D (OSCAL WITH D) 500-200 MG-UNIT per tablet Take 1 tablet by mouth 2 (two) times daily.    [provider]  cetirizine (ZYRTEC) 10 MG tablet Take 10 mg by mouth daily.    [provider]  clobetasol ointment (TEMOVATE) 9.17 % Apply 1  application topically daily.  04/18/17   [provider]  COSENTYX 300 DOSE 150 MG/ML SOSY Inject 300 mg into the muscle every 28 (twenty-eight) days.  10/27/16   [provider]  cyclobenzaprine (FLEXERIL) 10 MG tablet Take 10 mg by mouth 2 (two) times daily as needed for muscle spasms.     [provider]  ferrous sulfate 325 (65 FE) MG tablet Take 325 mg by mouth daily with breakfast.    [provider]  furosemide (LASIX) 40 MG tablet Take 40 mg by mouth daily as needed for fluid or edema.  06/18/16   [provider]  gabapentin (NEURONTIN) 100 MG capsule Take 100 mg by mouth 3 (three) times daily.     [provider]  glucosamine-chondroitin 500-400 MG tablet Take 1 tablet by mouth 2 (two) times daily.    [provider]  hydrOXYzine (ATARAX/VISTARIL) 10 MG tablet Take 10 mg by mouth at bedtime as needed for itching or anxiety. Reported on 10/27/2015    [provider]  levothyroxine (SYNTHROID, LEVOTHROID) 75 MCG tablet Take 75 mcg by mouth daily.    [provider]  LORazepam (ATIVAN) 1 MG tablet Take 1 mg by mouth at bedtime.     [provider]  methocarbamol (ROBAXIN) 500 MG tablet Take 1 tablet (500 mg total) by mouth 2 (two) times daily. 08/24/18   Volanda Napoleon, PA-C  mometasone (NASONEX) 50 MCG/ACT nasal spray Place 2 sprays into the nose daily.  07/18/16   [provider]  Multiple Vitamin (MULTIVITAMIN WITH MINERALS) TABS Take 1 tablet by mouth daily.    [provider]  pantoprazole (PROTONIX) 40 MG tablet Take 40 mg by mouth daily.     [provider]  rizatriptan (MAXALT-MLT) 10 MG disintegrating tablet Take 10 mg by mouth as needed for migraine.  03/14/18   [provider]  SYMBICORT 160-4.5 MCG/ACT inhaler Inhale 2 puffs into the lungs 2 (two) times daily. 05/02/14   [provider]  topiramate (TOPAMAX) 100 MG tablet Take 100 mg by mouth at bedtime.      [provider]  VITAMIN E PO Take 400 mg by mouth 2 (two) times daily.     [provider]    ALLERGIES:  Allergies  Allergen Reactions  . Amoxicillin Itching    Did it involve swelling of the face/tongue/throat, SOB, or low BP? No Did it involve sudden or severe rash/hives, skin peeling, or any reaction on the inside of your mouth or nose? Yes Did you need to seek medical attention at a hospital or doctor's office? Yes When did it last happen?14 yrs ago If all above answers are "NO", may proceed with cephalosporin use.   . Latex Itching  . Percocet [Oxycodone-Acetaminophen] Itching  . Plaquenil [Hydroxychloroquine Sulfate] Other (See Comments)    "  Psoriasis."  . Bactrim [Sulfamethoxazole-Trimethoprim] Rash  . Ciprofloxacin Hcl Rash  . Keflex [Cephalexin] Rash  . Penicillins Rash    Did it involve swelling of the face/tongue/throat, SOB, or low BP? No Did it involve sudden or severe rash/hives, skin peeling, or any reaction on the inside of your mouth or nose? Yes Did you need to seek medical attention at a hospital or doctor's office? Yes When did it last happen?14 yrs ago If all above answers are "NO", may proceed with cephalosporin use.   . Tetracyclines & Related Rash    SOCIAL HISTORY:  Social History   Tobacco Use  . Smoking status: Former Smoker    Packs/day: 0.25    Years: 8.00    Pack years: 2.00    Types: Cigarettes    Quit date: 02/28/2004    Years since quitting: 14.8  . Smokeless tobacco: Never Used  Substance Use Topics  . Alcohol use: Not Currently    Alcohol/week: 0.0 standard drinks    FAMILY HISTORY: Family History  Problem Relation Age of Onset  . Lupus Brother   . Breast cancer Maternal Grandmother 102  . Hypertension Mother   . Diabetes Mother   . Hypertension Father   . Colon cancer Neg Hx   . Esophageal cancer Neg Hx   . Rectal cancer Neg Hx   . Stomach cancer Neg Hx     EXAM: BP (!) 168/106 (BP  Location: Right Arm)   Pulse (!) 104   Temp 98.8 F (37.1 C) (Oral)   Ht 5\' 6"  (1.676 m)   Wt 69.9 kg   LMP 06/25/2016   SpO2 97%   BMI 24.86 kg/m  CONSTITUTIONAL: Alert and oriented and responds appropriately to questions. Well-appearing; well-nourished HEAD: Normocephalic EYES: Conjunctivae clear, pupils appear equal, EOMI ENT: normal nose; moist mucous membranes, normal phonation, no stridor or drooling NECK: Supple, no meningismus, no nuchal rigidity, no LAD, patient has some radiation changes to her anterior neck but no redness, warmth, ecchymosis or swelling, her trachea is midline, no thyromegaly appreciated CARD: Regular and tachycardic; S1 and S2 appreciated; no murmurs, no clicks, no rubs, no gallops CHEST:  Chest wall is tender to palpation over the center of the sternum which reproduces her pain.  No crepitus, ecchymosis, erythema, warmth, rash or other lesions present.   RESP: Normal chest excursion without splinting or tachypnea; breath sounds clear and equal bilaterally; no wheezes, no rhonchi, no rales, no hypoxia or respiratory distress, speaking full sentences ABD/GI: Normal bowel sounds; non-distended; soft, non-tender, no rebound, no guarding, no peritoneal signs, no hepatosplenomegaly BACK:  The back appears normal and is non-tender to palpation, there is no CVA tenderness EXT: Normal ROM in all joints; non-tender to palpation; no edema; normal capillary refill; no cyanosis, no calf tenderness or swelling    SKIN: Normal color for age and race; warm; no rash NEURO: Moves all extremities equally, sensation to light touch intact diffusely, cranial nerves II through XII intact, normal speech PSYCH: The patient's mood and manner are appropriate. Grooming and personal hygiene are appropriate.  MEDICAL DECISION MAKING: Patient here with complaints of migraine headache.  States this feels exactly like her previous migraines.  No focal neurologic deficits.  No head injury.   Doubt intracranial hemorrhage, stroke, meningitis.  Will give Toradol, Reglan, Benadryl and IV fluids.  Patient also complaining of chest pain.  Chest pain seems very atypical and likely musculoskeletal in nature but she is tachycardic here and has a history of  cancer.  States she was never told that she had metastasis to her bones or lungs.  She is high risk for PE.  I have low suspicion for ACS but will obtain troponin.  She does have history of previous tobacco use but no other risk factors for ACS other than age.  Will obtain CT of her chest today.  Discussed with patient that Toradol would likely also help with her chest pain.  Will obtain EKG.  ED PROGRESS: EKG shows no ischemic abnormality.  Labs, CT scan pending.  Signed out to Dr. Tomi Bamberger to follow-up on patient's results and reassess patient after migraine cocktail.   I reviewed all nursing notes, vitals, pertinent previous records, EKGs, lab and urine results, imaging (as available).     EKG Interpretation  Date/Time:  Friday January 12 2019 06:25:09 EDT Ventricular Rate:  92 PR Interval:    QRS Duration: 88 QT Interval:  369 QTC Calculation: 457 R Axis:   12 Text Interpretation:  Sinus rhythm No significant change since last tracing Confirmed by Nevena Rozenberg, Cyril Mourning [phone]) on 01/12/2019 6:27:53 AM         Hjalmar Ballengee, Delice Bison, DO 01/12/19 0122

## 2019-01-12 NOTE — ED Provider Notes (Signed)
Pt initially seen by Dr Leonides Schanz.  Please see her note.  Heart score low risk.  Initial trop 17.  Delta trop pending.   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 This could be the source of mid upper chest wall  pain.  "  HEAR Score: 1   Pt with low risk hear score.  Delta trop is 8.  Sx not suggestive of ACS.  Likely chest wall pain associated with her arthritis.    BP improved with pain management and treatment.  Noted to be elevated in the ED.  Improved with pain management and meds.  Patient states she has been told that her blood pressure was slightly elevated recently but they thought it was related to her alternative medication use and chest discomfort.  Stable for outpt discharge.  Patient would like to follow-up with her primary care doctor regarding her blood pressure.   Dorie Rank, MD 01/12/19 1017

## 2019-01-12 NOTE — Discharge Instructions (Signed)
Continue your current medications.  Follow-up with your primary care doctor to check on your blood pressure

## 2019-01-12 NOTE — ED Notes (Signed)
Patient verbalizes understanding of discharge instructions. Opportunity for questioning and answers were provided. Armband removed by staff, pt discharged from ED.   Pt unable to sign due to nonworking signature pad.

## 2019-01-12 NOTE — ED Triage Notes (Signed)
Per EMS called out for migraine x 2 weeks. c/o sob/tightness in chest as arriving to hospital.

## 2019-01-12 NOTE — ED Notes (Signed)
Provider at bedside

## 2019-01-12 NOTE — ED Notes (Signed)
ED Provider at bedside. 

## 2019-01-17 ENCOUNTER — Other Ambulatory Visit: Payer: Self-pay

## 2019-01-17 ENCOUNTER — Emergency Department (HOSPITAL_COMMUNITY)
Admission: EM | Admit: 2019-01-17 | Discharge: 2019-01-17 | Disposition: A | Discharge status: Home / Self Care | Payer: Medicare Other | Attending: Emergency Medicine | Admitting: Emergency Medicine

## 2019-01-17 ENCOUNTER — Encounter (HOSPITAL_COMMUNITY): Payer: Self-pay | Admitting: Emergency Medicine

## 2019-01-17 ENCOUNTER — Emergency Department (HOSPITAL_COMMUNITY): Payer: Medicare Other

## 2019-01-17 DIAGNOSIS — G43009 Migraine without aura, not intractable, without status migrainosus: Secondary | ICD-10-CM | POA: Insufficient documentation

## 2019-01-17 DIAGNOSIS — J449 Chronic obstructive pulmonary disease, unspecified: Secondary | ICD-10-CM | POA: Insufficient documentation

## 2019-01-17 DIAGNOSIS — R0789 Other chest pain: Secondary | ICD-10-CM | POA: Diagnosis not present

## 2019-01-17 DIAGNOSIS — Z85818 Personal history of malignant neoplasm of other sites of lip, oral cavity, and pharynx: Secondary | ICD-10-CM | POA: Insufficient documentation

## 2019-01-17 DIAGNOSIS — E039 Hypothyroidism, unspecified: Secondary | ICD-10-CM | POA: Insufficient documentation

## 2019-01-17 DIAGNOSIS — R51 Headache: Secondary | ICD-10-CM | POA: Diagnosis present

## 2019-01-17 DIAGNOSIS — R440 Auditory hallucinations: Secondary | ICD-10-CM | POA: Diagnosis not present

## 2019-01-17 DIAGNOSIS — R441 Visual hallucinations: Secondary | ICD-10-CM | POA: Insufficient documentation

## 2019-01-17 DIAGNOSIS — Z79899 Other long term (current) drug therapy: Secondary | ICD-10-CM | POA: Diagnosis not present

## 2019-01-17 DIAGNOSIS — Z91040 Latex allergy status: Secondary | ICD-10-CM | POA: Insufficient documentation

## 2019-01-17 DIAGNOSIS — Z87891 Personal history of nicotine dependence: Secondary | ICD-10-CM | POA: Diagnosis not present

## 2019-01-17 DIAGNOSIS — E1140 Type 2 diabetes mellitus with diabetic neuropathy, unspecified: Secondary | ICD-10-CM | POA: Diagnosis not present

## 2019-01-17 LAB — COMPREHENSIVE METABOLIC PANEL
ALT: 19 U/L (ref 0–44)
AST: 21 U/L (ref 15–41)
Albumin: 4 g/dL (ref 3.5–5.0)
Alkaline Phosphatase: 63 U/L (ref 38–126)
Anion gap: 11 (ref 5–15)
BUN: 11 mg/dL (ref 6–20)
CO2: 22 mmol/L (ref 22–32)
Calcium: 9.7 mg/dL (ref 8.9–10.3)
Chloride: 110 mmol/L (ref 98–111)
Creatinine, Ser: 0.85 mg/dL (ref 0.44–1.00)
GFR calc Af Amer: >60 mL/min (ref >60)
GFR calc non Af Amer: >60 mL/min (ref >60)
Glucose, Bld: 90 mg/dL (ref 70–99)
Potassium: 3.4 mmol/L — ABNORMAL LOW (ref 3.5–5.1)
Sodium: 143 mmol/L (ref 135–145)
Total Bilirubin: 0.4 mg/dL (ref 0.3–1.2)
Total Protein: 8 g/dL (ref 6.5–8.1)

## 2019-01-17 LAB — CBC WITH DIFFERENTIAL/PLATELET
Abs Immature Granulocytes: 0.02 10*3/uL (ref 0.00–0.07)
Basophils Absolute: 0 10*3/uL (ref 0.0–0.1)
Basophils Relative: 1 %
Eosinophils Absolute: 0.1 10*3/uL (ref 0.0–0.5)
Eosinophils Relative: 1 %
HCT: 37.9 % (ref 36.0–46.0)
Hemoglobin: 12.6 g/dL (ref 12.0–15.0)
Immature Granulocytes: 0 %
Lymphocytes Relative: 31 %
Lymphs Abs: 2.4 10*3/uL (ref 0.7–4.0)
MCH: 31.1 pg (ref 26.0–34.0)
MCHC: 33.2 g/dL (ref 30.0–36.0)
MCV: 93.6 fL (ref 80.0–100.0)
Monocytes Absolute: 0.5 10*3/uL (ref 0.1–1.0)
Monocytes Relative: 6 %
Neutro Abs: 4.8 10*3/uL (ref 1.7–7.7)
Neutrophils Relative %: 61 %
Platelets: 278 10*3/uL (ref 150–400)
RBC: 4.05 MIL/uL (ref 3.87–5.11)
RDW: 12.1 % (ref 11.5–15.5)
WBC: 7.7 10*3/uL (ref 4.0–10.5)
nRBC: 0 % (ref 0.0–0.2)

## 2019-01-17 LAB — RAPID URINE DRUG SCREEN, HOSP PERFORMED
Amphetamines: NOT DETECTED
Barbiturates: NOT DETECTED
Benzodiazepines: NOT DETECTED
Cocaine: NOT DETECTED
Opiates: NOT DETECTED
Tetrahydrocannabinol: NOT DETECTED

## 2019-01-17 LAB — ETHANOL: Alcohol, Ethyl (B): <10 mg/dL (ref <10)

## 2019-01-17 LAB — ACETAMINOPHEN LEVEL: Acetaminophen (Tylenol), Serum: <10 ug/mL — ABNORMAL LOW (ref 10–30)

## 2019-01-17 LAB — TROPONIN I (HIGH SENSITIVITY): Troponin I (High Sensitivity): 5 ng/L (ref <18)

## 2019-01-17 LAB — SALICYLATE LEVEL: Salicylate Lvl: <7 mg/dL (ref 2.8–30.0)

## 2019-01-17 MED ORDER — PROCHLORPERAZINE EDISYLATE 10 MG/2ML IJ SOLN
10.0000 mg | Freq: Once | INTRAMUSCULAR | Status: AC
  Administered 2019-01-17: 10 mg via INTRAVENOUS
  Filled 2019-01-17: qty 2

## 2019-01-17 MED ORDER — SODIUM CHLORIDE 0.9 % IV BOLUS
1000.0000 mL | Freq: Once | INTRAVENOUS | Status: AC
  Administered 2019-01-17: 18:00:00 1000 mL via INTRAVENOUS

## 2019-01-17 MED ORDER — DIPHENHYDRAMINE HCL 50 MG/ML IJ SOLN
25.0000 mg | Freq: Once | INTRAMUSCULAR | Status: AC
  Administered 2019-01-17: 25 mg via INTRAVENOUS
  Filled 2019-01-17: qty 1

## 2019-01-17 NOTE — ED Notes (Signed)
ED Provider at bedside. 

## 2019-01-17 NOTE — ED Notes (Signed)
Per mother at bedside, the patient has been speaking the pa

## 2019-01-17 NOTE — Discharge Instructions (Signed)
Continue your home medications as previously prescribed. Return to the ED if you start to have worsening symptoms, additional injuries or falls, blurry vision, numbness in arms or legs, vomiting or leg swelling.
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
## 2019-01-17 NOTE — ED Notes (Signed)
Pt verbalized understanding of discharge instructions and denies any further questions at this time.   

## 2019-01-17 NOTE — ED Notes (Signed)
Patient transported to X-ray & CT °

## 2019-01-24 ENCOUNTER — Ambulatory Visit: Payer: Self-pay | Admitting: Rheumatology
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
## 2019-02-02 ENCOUNTER — Other Ambulatory Visit: Payer: Self-pay

## 2019-02-02 ENCOUNTER — Telehealth: Payer: Self-pay | Admitting: Rheumatology

## 2019-02-02 ENCOUNTER — Telehealth (INDEPENDENT_AMBULATORY_CARE_PROVIDER_SITE_OTHER): Payer: Medicare Other | Admitting: Rheumatology

## 2019-02-02 DIAGNOSIS — Z8659 Personal history of other mental and behavioral disorders: Secondary | ICD-10-CM

## 2019-02-02 DIAGNOSIS — Z8709 Personal history of other diseases of the respiratory system: Secondary | ICD-10-CM

## 2019-02-02 DIAGNOSIS — L409 Psoriasis, unspecified: Secondary | ICD-10-CM | POA: Diagnosis not present

## 2019-02-02 DIAGNOSIS — C119 Malignant neoplasm of nasopharynx, unspecified: Secondary | ICD-10-CM

## 2019-02-02 DIAGNOSIS — N200 Calculus of kidney: Secondary | ICD-10-CM

## 2019-02-02 DIAGNOSIS — L4050 Arthropathic psoriasis, unspecified: Secondary | ICD-10-CM | POA: Diagnosis not present

## 2019-02-02 DIAGNOSIS — R768 Other specified abnormal immunological findings in serum: Secondary | ICD-10-CM | POA: Diagnosis not present

## 2019-02-02 DIAGNOSIS — Z79899 Other long term (current) drug therapy: Secondary | ICD-10-CM | POA: Diagnosis not present

## 2019-02-02 DIAGNOSIS — Z8639 Personal history of other endocrine, nutritional and metabolic disease: Secondary | ICD-10-CM

## 2019-02-02 DIAGNOSIS — Z8719 Personal history of other diseases of the digestive system: Secondary | ICD-10-CM

## 2019-02-02 NOTE — Telephone Encounter (Signed)
Advised patient that prednisone taper would need to come from her PCP and patient verbalized understanding. Advised patient that Dr. Estanislado Pandy can only offer a cortisone injection at this time. Patient will call her PCP for a prednisone taper.

## 2019-02-02 NOTE — Telephone Encounter (Signed)
Patient should get the prednisone taper from her PCP.  I do not have any records of previous prescriptions.  It should come from the same physician who prescribed her previous prednisone tapers.  I can only offer cortisone injection.

## 2019-02-02 NOTE — Telephone Encounter (Signed)
Patient called stating she had a virtual appointment with Dr. Estanislado Pandy today.  Patient states Dr. Estanislado Pandy recommended that she have her PCP send in a prescription of Prednisone since she is scheduled to see him next week.  Patient states she does not want to wait for appointment and requesting Dr. Estanislado Pandy send a prescription of Prednisone to [facility] at [address]

## 2019-02-06 ENCOUNTER — Emergency Department (HOSPITAL_COMMUNITY)
Admission: EM | Admit: 2019-02-06 | Discharge: 2019-02-07 | Disposition: A | Discharge status: Home / Self Care | Payer: Medicare Other | Attending: Emergency Medicine | Admitting: Emergency Medicine

## 2019-02-06 ENCOUNTER — Other Ambulatory Visit: Payer: Self-pay

## 2019-02-06 ENCOUNTER — Encounter (HOSPITAL_COMMUNITY): Payer: Self-pay | Admitting: Emergency Medicine

## 2019-02-06 ENCOUNTER — Emergency Department (HOSPITAL_COMMUNITY): Payer: Medicare Other

## 2019-02-06 DIAGNOSIS — J449 Chronic obstructive pulmonary disease, unspecified: Secondary | ICD-10-CM | POA: Insufficient documentation

## 2019-02-06 DIAGNOSIS — E119 Type 2 diabetes mellitus without complications: Secondary | ICD-10-CM | POA: Diagnosis not present

## 2019-02-06 DIAGNOSIS — Z87891 Personal history of nicotine dependence: Secondary | ICD-10-CM | POA: Diagnosis not present

## 2019-02-06 DIAGNOSIS — Z91040 Latex allergy status: Secondary | ICD-10-CM | POA: Insufficient documentation

## 2019-02-06 DIAGNOSIS — R072 Precordial pain: Secondary | ICD-10-CM | POA: Diagnosis not present

## 2019-02-06 DIAGNOSIS — F23 Brief psychotic disorder: Secondary | ICD-10-CM | POA: Insufficient documentation

## 2019-02-06 DIAGNOSIS — E039 Hypothyroidism, unspecified: Secondary | ICD-10-CM | POA: Insufficient documentation

## 2019-02-06 DIAGNOSIS — Z79899 Other long term (current) drug therapy: Secondary | ICD-10-CM | POA: Insufficient documentation

## 2019-02-06 DIAGNOSIS — R079 Chest pain, unspecified: Secondary | ICD-10-CM | POA: Diagnosis present

## 2019-02-06 LAB — CBC WITH DIFFERENTIAL/PLATELET
Abs Immature Granulocytes: 0.04 10*3/uL (ref 0.00–0.07)
Basophils Absolute: 0 10*3/uL (ref 0.0–0.1)
Basophils Relative: 0 %
Eosinophils Absolute: 0 10*3/uL (ref 0.0–0.5)
Eosinophils Relative: 0 %
HCT: 41.8 % (ref 36.0–46.0)
Hemoglobin: 14.1 g/dL (ref 12.0–15.0)
Immature Granulocytes: 1 %
Lymphocytes Relative: 16 %
Lymphs Abs: 1.4 10*3/uL (ref 0.7–4.0)
MCH: 30.6 pg (ref 26.0–34.0)
MCHC: 33.7 g/dL (ref 30.0–36.0)
MCV: 90.7 fL (ref 80.0–100.0)
Monocytes Absolute: 0.1 10*3/uL (ref 0.1–1.0)
Monocytes Relative: 1 %
Neutro Abs: 7 10*3/uL (ref 1.7–7.7)
Neutrophils Relative %: 82 %
Platelets: 381 10*3/uL (ref 150–400)
RBC: 4.61 MIL/uL (ref 3.87–5.11)
RDW: 12 % (ref 11.5–15.5)
WBC: 8.5 10*3/uL (ref 4.0–10.5)
nRBC: 0 % (ref 0.0–0.2)

## 2019-02-06 MED ORDER — LORAZEPAM 1 MG PO TABS
1.0000 mg | ORAL_TABLET | Freq: Once | ORAL | Status: AC
  Administered 2019-02-06: 1 mg via ORAL
  Filled 2019-02-06: qty 1

## 2019-02-06 MED ORDER — KETOROLAC TROMETHAMINE 15 MG/ML IJ SOLN
15.0000 mg | Freq: Once | INTRAMUSCULAR | Status: AC
  Administered 2019-02-06: 15 mg via INTRAVENOUS
  Filled 2019-02-06: qty 1
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
## 2019-02-06 NOTE — ED Notes (Signed)
Patient mother calling asking for an update on patient She is stating she has some concerns for patiens and would like to talk to someone  Please call as soon as possible [phone]

## 2019-02-07 ENCOUNTER — Ambulatory Visit (HOSPITAL_COMMUNITY)
Admission: RE | Admit: 2019-02-07 | Discharge: 2019-02-07 | Disposition: A | Discharge status: Home / Self Care | Payer: Medicare Other | Source: Home / Self Care | Attending: Psychiatry | Admitting: Psychiatry

## 2019-02-07 DIAGNOSIS — R072 Precordial pain: Secondary | ICD-10-CM | POA: Diagnosis not present

## 2019-02-07 LAB — BASIC METABOLIC PANEL
Anion gap: 14 (ref 5–15)
BUN: 17 mg/dL (ref 6–20)
CO2: 19 mmol/L — ABNORMAL LOW (ref 22–32)
Calcium: 10.3 mg/dL (ref 8.9–10.3)
Chloride: 106 mmol/L (ref 98–111)
Creatinine, Ser: 1.05 mg/dL — ABNORMAL HIGH (ref 0.44–1.00)
GFR calc Af Amer: >60 mL/min (ref >60)
GFR calc non Af Amer: >60 mL/min (ref >60)
Glucose, Bld: 128 mg/dL — ABNORMAL HIGH (ref 70–99)
Potassium: 2.8 mmol/L — ABNORMAL LOW (ref 3.5–5.1)
Sodium: 139 mmol/L (ref 135–145)

## 2019-02-07 LAB — TROPONIN I (HIGH SENSITIVITY)
Troponin I (High Sensitivity): 10 ng/L (ref <18)
Troponin I (High Sensitivity): 8 ng/L (ref <18)

## 2019-02-07 MED ORDER — POTASSIUM CHLORIDE CRYS ER 20 MEQ PO TBCR
40.0000 meq | EXTENDED_RELEASE_TABLET | Freq: Once | ORAL | Status: AC
  Administered 2019-02-07: 40 meq via ORAL
  Filled 2019-02-07: qty 2
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
## 2019-02-07 NOTE — ED Notes (Signed)
Patient son calling asking for a call back as an update on patient [phone]  [name] is his name
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
## 2019-02-07 NOTE — ED Notes (Signed)
Pt verbally understood discharge instructions. Pt stable and ambulatory upon discharge. Pt declined having family updated by provider

## 2019-02-07 NOTE — Discharge Instructions (Addendum)
New laboratory work-up, x-ray, and EKG are reassuring in the emergency department tonight.  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  This is likely the source of your chest pain.  Please discuss these findings with your doctor.  Please continue taking your medications as prescribed.

## 2019-02-19 ENCOUNTER — Ambulatory Visit: Payer: Medicare Other | Admitting: Psychology

## 2019-02-20 ENCOUNTER — Ambulatory Visit (INDEPENDENT_AMBULATORY_CARE_PROVIDER_SITE_OTHER): Payer: Medicare Other | Admitting: Psychology

## 2019-02-20 DIAGNOSIS — F418 Other specified anxiety disorders: Secondary | ICD-10-CM | POA: Diagnosis not present
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
## 2019-02-28 ENCOUNTER — Other Ambulatory Visit: Payer: Self-pay

## 2019-02-28 ENCOUNTER — Encounter: Payer: Self-pay | Admitting: Cardiology

## 2019-02-28 ENCOUNTER — Ambulatory Visit (INDEPENDENT_AMBULATORY_CARE_PROVIDER_SITE_OTHER): Payer: Medicare Other | Admitting: Cardiology

## 2019-02-28 VITALS — BP 96/68 | HR 101 | Ht 66.0 in | Wt 150.1 lb

## 2019-02-28 DIAGNOSIS — Z01812 Encounter for preprocedural laboratory examination: Secondary | ICD-10-CM

## 2019-02-28 DIAGNOSIS — L409 Psoriasis, unspecified: Secondary | ICD-10-CM | POA: Diagnosis not present

## 2019-02-28 DIAGNOSIS — R0602 Shortness of breath: Secondary | ICD-10-CM

## 2019-02-28 DIAGNOSIS — R079 Chest pain, unspecified: Secondary | ICD-10-CM

## 2019-02-28 DIAGNOSIS — E119 Type 2 diabetes mellitus without complications: Secondary | ICD-10-CM | POA: Diagnosis not present

## 2019-02-28 DIAGNOSIS — R0601 Orthopnea: Secondary | ICD-10-CM | POA: Insufficient documentation

## 2019-02-28 HISTORY — DX: Orthopnea: R06.01

## 2019-02-28 HISTORY — DX: Shortness of breath: R06.02

## 2019-02-28 HISTORY — DX: Chest pain, unspecified: R07.9

## 2019-02-28 MED ORDER — AMLODIPINE BESYLATE 5 MG PO TABS: 5.0000 mg | ORAL_TABLET | Freq: Every day | ORAL | Status: DC

## 2019-02-28 MED ORDER — NITROGLYCERIN 0.4 MG SL SUBL: 0.4000 mg | SUBLINGUAL_TABLET | SUBLINGUAL | 3 refills | Status: DC | PRN

## 2019-02-28 MED ORDER — METOPROLOL SUCCINATE ER 25 MG PO TB24: 25.0000 mg | ORAL_TABLET | Freq: Every day | ORAL | 3 refills | Status: DC

## 2019-02-28 NOTE — Patient Instructions (Addendum)
Medication Instructions:  Your physician has recommended you make the following change in your medication:   DECREASE amlodipine (norvasc) 5 mg: Take 1 tablet daily  START metoprolol succinate (toprol XL) 25 mg: Take 1 tablet daily   START nitroglycerin as needed for chest pain: When having chest pain, stop what you are doing and sit down. Take 1 nitro, wait 5 minutes. Still having chest pain, take 1 nitro, wait 5 minutes. Still having chest pain, take 1 nitro, dial 911. Total of 3 nitro in 15 minutes.  If you need a refill on your cardiac medications before your next appointment, please call your pharmacy.   Lab work: Your physician recommends that you return for lab work within 3-7 days before your cardiac CTA: BMP. Please return to our office for lab work, no appointment needed. No need to fast beforehand.   If you have labs (blood work) drawn today and your tests are completely normal, you will receive your results only by: Marland Kitchen MyChart Message (if you have MyChart) OR . A paper copy in the mail If you have any lab test that is abnormal or we need to change your treatment, we will call you to review the results.  Testing/Procedures: You had an EKG today.   Your physician has requested that you have an echocardiogram. Echocardiography is a painless test that uses sound waves to create images of your heart. It provides your doctor with information about the size and shape of your heart and how well your heart's chambers and valves are working. This procedure takes approximately one hour. There are no restrictions for this procedure.  Your physician has requested that you have cardiac CT. Cardiac computed tomography (CT) is a painless test that uses an x-ray machine to take clear, detailed pictures of your heart. For further information please visit [email or web address]. Please follow instruction sheet as given.  [facility] [place], [place] [address] [phone]   If scheduled at [facility], please arrive at the [facility] main entrance of [facility] 30-45 minutes prior to test start time. Proceed to the [facility] Radiology Department (first floor) to check-in and test prep.  If scheduled at [facility], please arrive 15 mins early for check-in and test prep.  Please follow these instructions carefully (unless otherwise directed):  Hold all erectile dysfunction medications at least 5 days prior to test.  On the Night Before the Test: . Be sure to Drink plenty of water. . Do not consume any caffeinated/decaffeinated beverages or chocolate 12 hours prior to your test. . Do not take any antihistamines 12 hours prior to your test.  On the Day of the Test: . Drink plenty of water. Do not drink any water within one hour of the test. . Do not eat any food 4 hours prior to the test. . You may take your regular medications prior to the test.  . Take metoprolol (Lopressor) two hours prior to test. . HOLD Furosemide morning of the test. . FEMALES- please wear underwire-free bra if available                  -If HR is less than 55 BPM- No Beta Blocker                -IF HR is greater than 55 BPM and patient is less than or equal to 3 yrs old Lopressor 100mg  x1.       After the  Test: . Drink plenty of water. . After receiving IV contrast, you may experience a mild flushed feeling. This is normal. . On occasion, you may experience a mild rash up to 24 hours after the test. This is not dangerous. If this occurs, you can take Benadryl 25 mg and increase your fluid intake. . If you experience trouble breathing, this can be serious. If it is severe call 911 IMMEDIATELY. If it is mild, please call our office.    Please contact the cardiac imaging nurse navigator should you have any questions/concerns Marchia Bond, RN Navigator Cardiac Imaging [facility] [phone]  Office  [phone] Cell    Follow-Up: At [facility], you and your health needs are our priority.  As part of our continuing mission to provide you with exceptional heart care, we have created designated Provider Care Teams.  These Care Teams include your primary Cardiologist (physician) and Advanced Practice Providers (APPs -  Physician Assistants and Nurse Practitioners) who all work together to provide you with the care you need, when you need it. You will need a follow up appointment in 6 weeks.       Echocardiogram An echocardiogram is a procedure that uses painless sound waves (ultrasound) to produce an image of the heart. Images from an echocardiogram can provide important information about:  Signs of coronary artery disease (CAD).  Aneurysm detection. An aneurysm is a weak or damaged part of an artery wall that bulges out from the normal force of blood pumping through the body.  Heart size and shape. Changes in the size or shape of the heart can be associated with certain conditions, including heart failure, aneurysm, and CAD.  Heart muscle function.  Heart valve function.  Signs of a past heart attack.  Fluid buildup around the heart.  Thickening of the heart muscle.  A tumor or infectious growth around the heart valves. Tell a health care provider about:  Any allergies you have.  All medicines you are taking, including vitamins, herbs, eye drops, creams, and over-the-counter medicines.  Any blood disorders you have.  Any surgeries you have had.  Any medical conditions you have.  Whether you are pregnant or may be pregnant. What are the risks? Generally, this is a safe procedure. However, problems may occur, including:  Allergic reaction to dye (contrast) that may be used during the procedure. What happens before the procedure? No specific preparation is needed. You may eat and drink normally. What happens during the procedure?   An IV tube may be  inserted into one of your veins.  You may receive contrast through this tube. A contrast is an injection that improves the quality of the pictures from your heart.  A gel will be applied to your chest.  A wand-like tool (transducer) will be moved over your chest. The gel will help to transmit the sound waves from the transducer.  The sound waves will harmlessly bounce off of your heart to allow the heart images to be captured in real-time motion. The images will be recorded on a computer. The procedure may vary among health care providers and hospitals. What happens after the procedure?  You may return to your normal, everyday life, including diet, activities, and medicines, unless your health care provider tells you not to do that. Summary  An echocardiogram is a procedure that uses painless sound waves (ultrasound) to produce an image of the heart.  Images from an echocardiogram can provide important information about the  size and shape of your heart, heart muscle function, heart valve function, and fluid buildup around your heart.  You do not need to do anything to prepare before this procedure. You may eat and drink normally.  After the echocardiogram is completed, you may return to your normal, everyday life, unless your health care provider tells you not to do that. This information is not intended to replace advice given to you by your health care provider. Make sure you discuss any questions you have with your health care provider. Document Released: 06/04/2000 Document Revised: 09/28/2018 Document Reviewed: 07/10/2016 Elsevier Patient Education  2020 [place].     Cardiac CT Angiogram  A cardiac CT angiogram is a procedure to look at the heart and the area around the heart. It may be done to help find the cause of chest pains or other symptoms of heart disease. During this procedure, a large X-ray machine, called a CT scanner, takes detailed pictures of the heart and the  surrounding area after a dye (contrast material) has been injected into blood vessels in the area. The procedure is also sometimes called a coronary CT angiogram, coronary artery scanning, or CTA. A cardiac CT angiogram allows the health care provider to see how well blood is flowing to and from the heart. The health care provider will be able to see if there are any problems, such as:  Blockage or narrowing of the coronary arteries in the heart.  Fluid around the heart.  Signs of weakness or disease in the muscles, valves, and tissues of the heart. Tell a health care provider about:  Any allergies you have. This is especially important if you have had a previous allergic reaction to contrast dye.  All medicines you are taking, including vitamins, herbs, eye drops, creams, and over-the-counter medicines.  Any blood disorders you have.  Any surgeries you have had.  Any medical conditions you have.  Whether you are pregnant or may be pregnant.  Any anxiety disorders, chronic pain, or other conditions you have that may increase your stress or prevent you from lying still. What are the risks? Generally, this is a safe procedure. However, problems may occur, including:  Bleeding.  Infection.  Allergic reactions to medicines or dyes.  Damage to other structures or organs.  Kidney damage from the dye or contrast that is used.  Increased risk of cancer from radiation exposure. This risk is low. Talk with your health care provider about: ? The risks and benefits of testing. ? How you can receive the lowest dose of radiation. What happens before the procedure?  Wear comfortable clothing and remove any jewelry, glasses, dentures, and hearing aids.  Follow instructions from your health care provider about eating and drinking. This may include: ? For 12 hours before the test - avoid caffeine. This includes tea, coffee, soda, energy drinks, and diet pills. Drink plenty of water or other  fluids that do not have caffeine in them. Being well-hydrated can prevent complications. ? For 4-6 hours before the test - stop eating and drinking. The contrast dye can cause nausea, but this is less likely if your stomach is empty.  Ask your health care provider about changing or stopping your regular medicines. This is especially important if you are taking diabetes medicines, blood thinners, or medicines to treat erectile dysfunction. What happens during the procedure?  Hair on your chest may need to be removed so that small sticky patches called electrodes can be placed on your chest.  These will transmit information that helps to monitor your heart during the test.  An IV tube will be inserted into one of your veins.  You might be given a medicine to control your heart rate during the test. This will help to ensure that good images are obtained.  You will be asked to lie on an exam table. This table will slide in and out of the CT machine during the procedure.  Contrast dye will be injected into the IV tube. You might feel warm, or you may get a metallic taste in your mouth.  You will be given a medicine (nitroglycerin) to relax (dilate) the arteries in your heart.  The table that you are lying on will move into the CT machine tunnel for the scan.  The person running the machine will give you instructions while the scans are being done. You may be asked to: ? Keep your arms above your head. ? Hold your breath. ? Stay very still, even if the table is moving.  When the scanning is complete, you will be moved out of the machine.  The IV tube will be removed. The procedure may vary among health care providers and hospitals. What happens after the procedure?  You might feel warm, or you may get a metallic taste in your mouth from the contrast dye.  You may have a headache from the nitroglycerin.  After the procedure, drink water or other fluids to wash (flush) the contrast material  out of your body.  Contact a health care provider if you have any symptoms of allergy to the contrast. These symptoms include: ? Shortness of breath. ? Rash or hives. ? A racing heartbeat.  Most people can return to their normal activities right after the procedure. Ask your health care provider what activities are safe for you.  It is up to you to get the results of your procedure. Ask your health care provider, or the department that is doing the procedure, when your results will be ready. Summary  A cardiac CT angiogram is a procedure to look at the heart and the area around the heart. It may be done to help find the cause of chest pains or other symptoms of heart disease.  During this procedure, a large X-ray machine, called a CT scanner, takes detailed pictures of the heart and the surrounding area after a dye (contrast material) has been injected into blood vessels in the area.  Ask your health care provider about changing or stopping your regular medicines before the procedure. This is especially important if you are taking diabetes medicines, blood thinners, or medicines to treat erectile dysfunction.  After the procedure, drink water or other fluids to wash (flush) the contrast material out of your body. This information is not intended to replace advice given to you by your health care provider. Make sure you discuss any questions you have with your health care provider. Document Released: 05/20/2008 Document Revised: 05/20/2017 Document Reviewed: 04/26/2016 Elsevier Patient Education  [place].    Nitroglycerin sublingual tablets What is this medicine? NITROGLYCERIN (nye troe GLI ser in) is a type of vasodilator. It relaxes blood vessels, increasing the blood and oxygen supply to your heart. This medicine is used to relieve chest pain caused by angina. It is also used to prevent chest pain before activities like climbing stairs, going outdoors in cold weather, or  sexual activity. This medicine may be used for other purposes; ask your health care provider or pharmacist  if you have questions. COMMON BRAND NAME(S): Nitroquick, Nitrostat, Nitrotab What should I tell my health care provider before I take this medicine? They need to know if you have any of these conditions:  anemia  head injury, recent stroke, or bleeding in the brain  liver disease  previous heart attack  an unusual or allergic reaction to nitroglycerin, other medicines, foods, dyes, or preservatives  pregnant or trying to get pregnant  breast-feeding How should I use this medicine? Take this medicine by mouth as needed. At the first sign of an angina attack (chest pain or tightness) place one tablet under your tongue. You can also take this medicine 5 to 10 minutes before an event likely to produce chest pain. Follow the directions on the prescription label. Let the tablet dissolve under the tongue. Do not swallow whole. Replace the dose if you accidentally swallow it. It will help if your mouth is not dry. Saliva around the tablet will help it to dissolve more quickly. Do not eat or drink, smoke or chew tobacco while a tablet is dissolving. If you are not better within 5 minutes after taking ONE dose of nitroglycerin, call 9-1-1 immediately to seek emergency medical care. Do not take more than 3 nitroglycerin tablets over 15 minutes. If you take this medicine often to relieve symptoms of angina, your doctor or health care professional may provide you with different instructions to manage your symptoms. If symptoms do not go away after following these instructions, it is important to call 9-1-1 immediately. Do not take more than 3 nitroglycerin tablets over 15 minutes. Talk to your pediatrician regarding the use of this medicine in children. Special care may be needed. Overdosage: If you think you have taken too much of this medicine contact a poison control center or emergency room at  once. NOTE: This medicine is only for you. Do not share this medicine with others. What if I miss a dose? This does not apply. This medicine is only used as needed. What may interact with this medicine? Do not take this medicine with any of the following medications:  certain migraine medicines like ergotamine and dihydroergotamine (DHE)  medicines used to treat erectile dysfunction like sildenafil, tadalafil, and vardenafil  riociguat This medicine may also interact with the following medications:  alteplase  aspirin  heparin  medicines for high blood pressure  medicines for mental depression  other medicines used to treat angina  phenothiazines like chlorpromazine, mesoridazine, prochlorperazine, thioridazine This list may not describe all possible interactions. Give your health care provider a list of all the medicines, herbs, non-prescription drugs, or dietary supplements you use. Also tell them if you smoke, drink alcohol, or use illegal drugs. Some items may interact with your medicine. What should I watch for while using this medicine? Tell your doctor or health care professional if you feel your medicine is no longer working. Keep this medicine with you at all times. Sit or lie down when you take your medicine to prevent falling if you feel dizzy or faint after using it. Try to remain calm. This will help you to feel better faster. If you feel dizzy, take several deep breaths and lie down with your feet propped up, or bend forward with your head resting between your knees. You may get drowsy or dizzy. Do not drive, use machinery, or do anything that needs mental alertness until you know how this drug affects you. Do not stand or sit up quickly, especially if you are an  older patient. This reduces the risk of dizzy or fainting spells. Alcohol can make you more drowsy and dizzy. Avoid alcoholic drinks. Do not treat yourself for coughs, colds, or pain while you are taking this  medicine without asking your doctor or health care professional for advice. Some ingredients may increase your blood pressure. What side effects may I notice from receiving this medicine? Side effects that you should report to your doctor or health care professional as soon as possible:  blurred vision  dry mouth  skin rash  sweating  the feeling of extreme pressure in the head  unusually weak or tired Side effects that usually do not require medical attention (report to your doctor or health care professional if they continue or are bothersome):  flushing of the face or neck  headache  irregular heartbeat, palpitations  nausea, vomiting This list may not describe all possible side effects. Call your doctor for medical advice about side effects. You may report side effects to FDA at 1-800-FDA-1088. Where should I keep my medicine? Keep out of the reach of children. Store at room temperature between 20 and 25 degrees C (68 and 77 degrees F). Store in [facility] [employer]. Protect from light and moisture. Keep tightly closed. Throw away any unused medicine after the expiration date. NOTE: This sheet is a summary. It may not cover all possible information. If you have questions about this medicine, talk to your doctor, pharmacist, or health care provider.  2020 Elsevier/Gold Standard (2013-04-05 17:57:36)   Metoprolol extended-release tablets What is this medicine? METOPROLOL (me TOE proe lole) is a beta-blocker. Beta-blockers reduce the workload on the heart and help it to beat more regularly. This medicine is used to treat high blood pressure and to prevent chest pain. It is also used to after a heart attack and to prevent an additional heart attack from occurring. This medicine may be used for other purposes; ask your health care provider or pharmacist if you have questions. COMMON BRAND NAME(S): toprol, Toprol XL What should I tell my health care provider before I take this  medicine? They need to know if you have any of these conditions:  diabetes  heart or vessel disease like slow heart rate, worsening heart failure, heart block, sick sinus syndrome or Raynaud's disease  kidney disease  liver disease  lung or breathing disease, like asthma or emphysema  pheochromocytoma  thyroid disease  an unusual or allergic reaction to metoprolol, other beta-blockers, medicines, foods, dyes, or preservatives  pregnant or trying to get pregnant  breast-feeding How should I use this medicine? Take this medicine by mouth with a glass of water. Follow the directions on the prescription label. Do not crush or chew. Take this medicine with or immediately after meals. Take your doses at regular intervals. Do not take more medicine than directed. Do not stop taking this medicine suddenly. This could lead to serious heart-related effects. Talk to your pediatrician regarding the use of this medicine in children. While this drug may be prescribed for children as young as 6 years for selected conditions, precautions do apply. Overdosage: If you think you have taken too much of this medicine contact a poison control center or emergency room at once. NOTE: This medicine is only for you. Do not share this medicine with others. What if I miss a dose? If you miss a dose, take it as soon as you can. If it is almost time for your next dose, take only that dose. Do not take  double or extra doses. What may interact with this medicine? This medicine may interact with the following medications:  certain medicines for blood pressure, heart disease, irregular heart beat  certain medicines for depression, like monoamine oxidase (MAO) inhibitors, fluoxetine, or paroxetine  clonidine  dobutamine  epinephrine  isoproterenol  reserpine This list may not describe all possible interactions. Give your health care provider a list of all the medicines, herbs, non-prescription drugs, or  dietary supplements you use. Also tell them if you smoke, drink alcohol, or use illegal drugs. Some items may interact with your medicine. What should I watch for while using this medicine? Visit your doctor or health care professional for regular check ups. Contact your doctor right away if your symptoms worsen. Check your blood pressure and pulse rate regularly. Ask your health care professional what your blood pressure and pulse rate should be, and when you should contact them. You may get drowsy or dizzy. Do not drive, use machinery, or do anything that needs mental alertness until you know how this medicine affects you. Do not sit or stand up quickly, especially if you are an older patient. This reduces the risk of dizzy or fainting spells. Contact your doctor if these symptoms continue. Alcohol may interfere with the effect of this medicine. Avoid alcoholic drinks. This medicine may increase blood sugar. Ask your healthcare provider if changes in diet or medicines are needed if you have diabetes. What side effects may I notice from receiving this medicine? Side effects that you should report to your doctor or health care professional as soon as possible:  allergic reactions like skin rash, itching or hives  cold or numb hands or feet  depression  difficulty breathing  faint  fever with sore throat  irregular heartbeat, chest pain  rapid weight gain   signs and symptoms of high blood sugar such as being more thirsty or hungry or having to urinate more than normal. You may also feel very tired or have blurry vision.  swollen legs or ankles Side effects that usually do not require medical attention (report to your doctor or health care professional if they continue or are bothersome):  anxiety or nervousness  change in sex drive or performance  dry skin  headache  nightmares or trouble sleeping  short term memory loss  stomach upset or diarrhea This list may not describe  all possible side effects. Call your doctor for medical advice about side effects. You may report side effects to FDA at 1-800-FDA-1088. Where should I keep my medicine? Keep out of the reach of children. Store at room temperature between 15 and 30 degrees C (59 and 86 degrees F). Throw away any unused medicine after the expiration date. NOTE: This sheet is a summary. It may not cover all possible information. If you have questions about this medicine, talk to your doctor, pharmacist, or health care provider.  2020 Elsevier/Gold Standard (2018-03-28 11:09:41)

## 2019-03-02 ENCOUNTER — Other Ambulatory Visit (HOSPITAL_BASED_OUTPATIENT_CLINIC_OR_DEPARTMENT_OTHER): Payer: Medicare Other

## 2019-03-06 ENCOUNTER — Ambulatory Visit: Payer: Medicare Other | Admitting: Rheumatology

## 2019-03-07 ENCOUNTER — Ambulatory Visit (HOSPITAL_COMMUNITY): Payer: Medicare Other | Attending: Cardiology

## 2019-03-07 ENCOUNTER — Other Ambulatory Visit: Payer: Self-pay

## 2019-03-07 DIAGNOSIS — R0602 Shortness of breath: Secondary | ICD-10-CM | POA: Insufficient documentation

## 2019-03-07 DIAGNOSIS — R079 Chest pain, unspecified: Secondary | ICD-10-CM

## 2019-03-08 ENCOUNTER — Ambulatory Visit (INDEPENDENT_AMBULATORY_CARE_PROVIDER_SITE_OTHER): Payer: Medicare Other | Admitting: Psychology

## 2019-03-08 ENCOUNTER — Ambulatory Visit: Payer: Medicare Other | Admitting: Psychology

## 2019-03-08 DIAGNOSIS — F418 Other specified anxiety disorders: Secondary | ICD-10-CM

## 2019-03-13 ENCOUNTER — Ambulatory Visit (INDEPENDENT_AMBULATORY_CARE_PROVIDER_SITE_OTHER): Payer: Medicare Other | Admitting: Psychology

## 2019-03-13 DIAGNOSIS — F418 Other specified anxiety disorders: Secondary | ICD-10-CM

## 2019-03-20 ENCOUNTER — Emergency Department (HOSPITAL_COMMUNITY): Payer: Medicare Other

## 2019-03-20 ENCOUNTER — Other Ambulatory Visit: Payer: Self-pay

## 2019-03-20 ENCOUNTER — Encounter (HOSPITAL_COMMUNITY): Payer: Self-pay | Admitting: Emergency Medicine

## 2019-03-20 ENCOUNTER — Emergency Department (HOSPITAL_COMMUNITY)
Admission: EM | Admit: 2019-03-20 | Discharge: 2019-03-20 | Disposition: A | Discharge status: Home / Self Care | Payer: Medicare Other | Attending: Emergency Medicine | Admitting: Emergency Medicine

## 2019-03-20 DIAGNOSIS — Z87891 Personal history of nicotine dependence: Secondary | ICD-10-CM | POA: Insufficient documentation

## 2019-03-20 DIAGNOSIS — Z79899 Other long term (current) drug therapy: Secondary | ICD-10-CM | POA: Insufficient documentation

## 2019-03-20 DIAGNOSIS — R0789 Other chest pain: Secondary | ICD-10-CM | POA: Insufficient documentation

## 2019-03-20 DIAGNOSIS — J449 Chronic obstructive pulmonary disease, unspecified: Secondary | ICD-10-CM | POA: Insufficient documentation

## 2019-03-20 DIAGNOSIS — Z85818 Personal history of malignant neoplasm of other sites of lip, oral cavity, and pharynx: Secondary | ICD-10-CM | POA: Insufficient documentation

## 2019-03-20 DIAGNOSIS — Z91040 Latex allergy status: Secondary | ICD-10-CM | POA: Diagnosis not present

## 2019-03-20 DIAGNOSIS — E119 Type 2 diabetes mellitus without complications: Secondary | ICD-10-CM | POA: Insufficient documentation

## 2019-03-20 DIAGNOSIS — E039 Hypothyroidism, unspecified: Secondary | ICD-10-CM | POA: Insufficient documentation

## 2019-03-20 LAB — BASIC METABOLIC PANEL
Anion gap: 10 (ref 5–15)
BUN: 16 mg/dL (ref 6–20)
CO2: 22 mmol/L (ref 22–32)
Calcium: 9.2 mg/dL (ref 8.9–10.3)
Chloride: 109 mmol/L (ref 98–111)
Creatinine, Ser: 0.87 mg/dL (ref 0.44–1.00)
GFR calc Af Amer: >60 mL/min (ref >60)
GFR calc non Af Amer: >60 mL/min (ref >60)
Glucose, Bld: 109 mg/dL — ABNORMAL HIGH (ref 70–99)
Potassium: 3.4 mmol/L — ABNORMAL LOW (ref 3.5–5.1)
Sodium: 141 mmol/L (ref 135–145)

## 2019-03-20 LAB — CBC
HCT: 32.6 % — ABNORMAL LOW (ref 36.0–46.0)
Hemoglobin: 10.7 g/dL — ABNORMAL LOW (ref 12.0–15.0)
MCH: 31 pg (ref 26.0–34.0)
MCHC: 32.8 g/dL (ref 30.0–36.0)
MCV: 94.5 fL (ref 80.0–100.0)
Platelets: 286 10*3/uL (ref 150–400)
RBC: 3.45 MIL/uL — ABNORMAL LOW (ref 3.87–5.11)
RDW: 12.7 % (ref 11.5–15.5)
WBC: 8.4 10*3/uL (ref 4.0–10.5)
nRBC: 0 % (ref 0.0–0.2)

## 2019-03-20 LAB — I-STAT BETA HCG BLOOD, ED (MC, WL, AP ONLY): I-stat hCG, quantitative: <5 m[IU]/mL (ref <5)

## 2019-03-20 LAB — TROPONIN I (HIGH SENSITIVITY)
Troponin I (High Sensitivity): 7 ng/L (ref <18)
Troponin I (High Sensitivity): 6 ng/L (ref <18)

## 2019-03-20 LAB — D-DIMER, QUANTITATIVE: D-Dimer, Quant: 2.78 ug/mL-FEU — ABNORMAL HIGH (ref 0.00–0.50)

## 2019-03-20 MED ORDER — METOPROLOL SUCCINATE ER 25 MG PO TB24
25.0000 mg | ORAL_TABLET | Freq: Every day | ORAL | Status: DC
  Administered 2019-03-20: 25 mg via ORAL
  Filled 2019-03-20: qty 1

## 2019-03-20 MED ORDER — IOHEXOL 350 MG/ML SOLN
100.0000 mL | Freq: Once | INTRAVENOUS | Status: AC | PRN
  Administered 2019-03-20: 10:00:00 100 mL via INTRAVENOUS

## 2019-03-20 MED ORDER — SODIUM CHLORIDE 0.9% FLUSH: 3.0000 mL | Freq: Once | INTRAVENOUS | Status: DC

## 2019-03-20 MED ORDER — AMLODIPINE BESYLATE 5 MG PO TABS
5.0000 mg | ORAL_TABLET | Freq: Once | ORAL | Status: AC
  Administered 2019-03-20: 5 mg via ORAL
  Filled 2019-03-20: qty 1

## 2019-03-20 NOTE — ED Provider Notes (Signed)
[facility] EMERGENCY DEPARTMENT Provider Note   CSN: [id number] Arrival date & time: 03/20/19  [id number]     History   Chief Complaint Chief Complaint  Patient presents with  . Chest Pain    HPI Shawnette Hilliker Souffrant is a 53 y.o. female.     The history is provided by the patient and medical records. No language interpreter was used.  Chest Pain  Mikaiah Delane Mcaloon is a 53 y.o. female who presents to the Emergency Department complaining of chest pain.  She presents to the emergency department complaining of waxing and waning chest pain that is been ongoing for over the last month. She has been seen by cardiology and had an outpatient echo performed in his scheduled for a coronary CT on Monday. Symptoms walks away with no clear alleviating or worsening factors. She had an episode about a month ago that lasted about 45 minutes. Over the last 24 hours she has experienced multiple recurrent episodes presents for further evaluation. She denies any associated diaphoresis, leg swelling or pain. No history of DVT/PE. She does have a history of lupus as well psoriatic arthritis. She was recently started on metoprolol and amlodipine for hypertension. Past Medical History:  Diagnosis Date  . Allergy   . Anxiety   . Arthritis    ra, lupus - ankles, knees, hands, neck  . Blood transfusion without reported diagnosis [place] with c/s surgery  . Bronchitis    Hx  . Cancer (HCC)    nasopharynx  . COPD (chronic obstructive pulmonary disease) ([place])   . DDD (degenerative disc disease), lumbar   . Diabetes mellitus without complication ([place])    related to prednisone use, no meds,cbg normal  . Diverticulosis   . Excessive or frequent menstruation   . GERD (gastroesophageal reflux disease)   . Headache(784.0)   . Hypothyroidism   . Irregular menstrual cycle   . Leiomyoma of uterus, unspecified   . Lupus ([place])   . Migraines   . Neuromuscular disorder (HCC)    neuropathy in legs/feet  . Neuropathy   . Papanicolaou smear of cervix with atypical squamous cells of undetermined significance (ASC-US)   . Psoriasis   . Sinusitis   . Trichimoniasis   . Wears partial dentures    full upper and lower partial    Patient Active Problem List   Diagnosis Date Noted  . Chest pain in adult 02/28/2019  . Orthopnea 02/28/2019  . Shortness of breath 02/28/2019  . 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 History of asthma 01/10/2017  . Psoriatic arthritis ([place]) 08/10/2016  . High risk medication use 08/10/2016  . Chronic pain syndrome 08/22/2013  . Pulmonary infiltrates 06/30/2013  . Psoriasis   . GERD (gastroesophageal reflux disease)   . Hypothyroidism   . Pleurisy 06/19/2013  . Caries 03/04/2013  . Nephrolithiasis 04/05/2012  . Hydronephrosis 04/05/2012  . UTI (lower urinary tract infection) 04/05/2012  . On prednisone therapy 04/05/2012  . Positive ANA (antinuclear antibody) 04/05/2012    Past Surgical History:  Procedure Laterality Date  . ANKLE ARTHROSCOPY Right   . CESAREAN SECTION     x 1  . DILATION AND CURETTAGE OF UTERUS     x 1 Missed Abortion  . ESOPHAGEAL DILATION    . LYMPH NODE BIOPSY     neck  . MULTIPLE EXTRACTIONS WITH ALVEOLOPLASTY N/A 03/05/2013   Procedure: MULTIPLE EXTRACION WITH ALVEOLOPLASTY, extraction of decayed teeth numbers 3,4,5,18,19,22,23,24,25, extraction of retained root  tips teeth numbers 2,20,21,26, bilateral mandibular alveoloplasty and upper right maxillary alveoloplasty;  Surgeon: Isac Caddy, DDS;  Location: [facility];  Service: Oral Surgery;  Laterality: N/A;  . scalp biopsy    . TOOTH EXTRACTION Bilateral 03/05/2013   Procedure: EXTRACTION MOLARS;  Surgeon: Isac Caddy, DDS;  Location: [facility];  Service: Oral Surgery;  Laterality: Bilateral;     OB History    Gravida  2   Para  1   Term      Preterm  1   AB  1   Living  1     SAB  1   TAB      Ectopic      Multiple       Live Births  1            Home Medications    Prior to Admission medications   Medication Sig Start Date End Date Taking? Authorizing Provider  albuterol (PROVENTIL HFA;VENTOLIN HFA) 108 (90 BASE) MCG/ACT inhaler Inhale 2 puffs into the lungs every 6 (six) hours as needed for wheezing or shortness of breath.    Yes [provider]  albuterol (PROVENTIL) (2.5 MG/3ML) 0.083% nebulizer solution Take 2.5 mg by nebulization every 6 (six) hours as needed for wheezing or shortness of breath. Reported on 10/27/2015   Yes [provider]  amLODipine (NORVASC) 5 MG tablet Take 1 tablet (5 mg total) by mouth daily. 02/28/19  Yes Richardo Priest, MD  Ascorbic Acid (VITAMIN C) 1000 MG tablet Take 1,000 mg by mouth 2 (two) times daily.   Yes [provider]  calcium-vitamin D (OSCAL WITH D) 500-200 MG-UNIT per tablet Take 1 tablet by mouth 2 (two) times daily.   Yes [provider]  cetirizine (ZYRTEC) 10 MG tablet Take 10 mg by mouth daily.   Yes [provider]  COSENTYX 300 DOSE 150 MG/ML SOSY Inject 300 mg into the muscle every 28 (twenty-eight) days.  10/27/16  Yes [provider]  diclofenac (VOLTAREN) 75 MG EC tablet Take 1 tablet by mouth 2 (two) times daily. 02/15/19  Yes [provider]  DULoxetine (CYMBALTA) 30 MG capsule Take 1 capsule by mouth daily. 02/06/19  Yes [provider]  ferrous sulfate 325 (65 FE) MG tablet Take 325 mg by mouth daily with breakfast.   Yes [provider]  furosemide (LASIX) 40 MG tablet Take 40 mg by mouth daily as needed for fluid or edema.  06/18/16  Yes [provider]  gabapentin (NEURONTIN) 100 MG capsule Take 100 mg by mouth 3 (three) times daily.    Yes [provider]  glucosamine-chondroitin 500-400 MG tablet Take 1 tablet by mouth 2 (two) times daily.   Yes [provider]  hydrOXYzine (ATARAX/VISTARIL) 10 MG tablet Take 10 mg by mouth at bedtime as  needed for itching or anxiety. Reported on 10/27/2015   Yes [provider]  levothyroxine (SYNTHROID, LEVOTHROID) 75 MCG tablet Take 75 mcg by mouth daily.   Yes [provider]  LORazepam (ATIVAN) 1 MG tablet Take 1 mg by mouth at bedtime.    Yes [provider]  metoprolol succinate (TOPROL-XL) 25 MG 24 hr tablet Take 1 tablet (25 mg total) by mouth daily. 02/28/19  Yes Richardo Priest, MD  mometasone (NASONEX) 50 MCG/ACT nasal spray Place 2 sprays into the nose daily.  07/18/16  Yes [provider]  Multiple Vitamin (MULTIVITAMIN WITH MINERALS) TABS Take 1 tablet by mouth  daily.   Yes [provider]  nitroGLYCERIN (NITROSTAT) 0.4 MG SL tablet Place 1 tablet (0.4 mg total) under the tongue every 5 (five) minutes as needed for chest pain. 02/28/19 05/29/19 Yes Richardo Priest, MD  pantoprazole (PROTONIX) 40 MG tablet Take 40 mg by mouth daily.    Yes [provider]  potassium chloride (K-DUR) 10 MEQ tablet Take 1 tablet by mouth daily. 02/20/19  Yes [provider]  risperiDONE (RISPERDAL) 0.5 MG tablet Take 0.5 mg by mouth at bedtime. 03/12/19  Yes [provider]  rizatriptan (MAXALT-MLT) 10 MG disintegrating tablet Take 10 mg by mouth as needed for migraine.  03/14/18  Yes [provider]  SYMBICORT 160-4.5 MCG/ACT inhaler Inhale 2 puffs into the lungs 2 (two) times daily. 05/02/14  Yes [provider]  topiramate (TOPAMAX) 100 MG tablet Take 100 mg by mouth at bedtime.    Yes [provider]  VITAMIN E PO Take 400 mg by mouth 2 (two) times daily.    Yes [provider]    Family History Family History  Problem Relation Age of Onset  . Lupus Brother   . Breast cancer Maternal Grandmother 35  . Hypertension Mother   . Diabetes Mother   . Hypertension Father   . Colon cancer Neg Hx   . Esophageal cancer Neg Hx   . Rectal cancer Neg Hx   . Stomach cancer Neg Hx     Social History Social  History   Tobacco Use  . Smoking status: Former Smoker    Packs/day: 0.25    Years: 8.00    Pack years: 2.00    Types: Cigarettes    Quit date: 02/28/2004    Years since quitting: 15.0  . Smokeless tobacco: Never Used  Substance Use Topics  . Alcohol use: Not Currently    Alcohol/week: 0.0 standard drinks  . Drug use: No     Allergies   Amoxicillin, Latex, Percocet [oxycodone-acetaminophen], Plaquenil [hydroxychloroquine sulfate], Bactrim [sulfamethoxazole-trimethoprim], Ciprofloxacin hcl, Keflex [cephalexin], Penicillins, and Tetracyclines & related   Review of Systems Review of Systems  Cardiovascular: Positive for chest pain.  All other systems reviewed and are negative.    Physical Exam Updated Vital Signs BP (!) 144/83   Pulse 93   Temp 98.3 F (36.8 C) (Oral)   Resp 15   Ht 5\' 6"  (1.676 m)   Wt 69.4 kg   LMP 06/25/2016   SpO2 100%   BMI 24.69 kg/m   Physical Exam Vitals signs and nursing note reviewed.  Constitutional:      Appearance: She is well-developed.  HENT:     Head: Normocephalic and atraumatic.  Cardiovascular:     Rate and Rhythm: Normal rate and regular rhythm.     Heart sounds: No murmur.  Pulmonary:     Effort: Pulmonary effort is normal. No respiratory distress.     Breath sounds: Normal breath sounds.  Abdominal:     Palpations: Abdomen is soft.     Tenderness: There is no abdominal tenderness. There is no guarding or rebound.  Musculoskeletal:        General: No swelling or tenderness.  Skin:    General: Skin is warm and dry.  Neurological:     Mental Status: She is alert and oriented to person, place, and time.  Psychiatric:        Behavior: Behavior normal.      ED Treatments / Results  Labs (all labs ordered are listed,  but only abnormal results are displayed) Labs Reviewed  BASIC METABOLIC PANEL - Abnormal; Notable for the following components:      Result Value   Potassium 3.4 (*)    Glucose, Bld 109 (*)    All  other components within normal limits  CBC - Abnormal; Notable for the following components:   RBC 3.45 (*)    Hemoglobin 10.7 (*)    HCT 32.6 (*)    All other components within normal limits  D-DIMER, QUANTITATIVE (NOT AT [facility]) - Abnormal; Notable for the following components:   D-Dimer, Quant 2.78 (*)    All other components within normal limits  I-STAT BETA HCG BLOOD, ED ([facility], [facility], [facility] ONLY)  TROPONIN I (HIGH SENSITIVITY)  TROPONIN I (HIGH SENSITIVITY)    EKG EKG Interpretation  Date/Time:  Tuesday March 20 2019 00:45:58 EDT Ventricular Rate:  103 PR Interval:  138 QRS Duration: 70 QT Interval:  342 QTC Calculation: 448 R Axis:   22 Text Interpretation:   Poor data quality, interpretation may be adversely affected Sinus tachycardia with occasional Premature ventricular complexes Low voltage QRS Borderline ECG Confirmed by Quintella Reichert [phone]) on 03/20/2019 7:54:30 AM   Radiology Dg Chest 2 View  Result Date: 03/20/2019 CLINICAL DATA:  Chest pain. Awoke from sleep with chest pain. EXAM: CHEST - 2 VIEW COMPARISON:  Radiograph 02/07/2019. Chest CT 01/12/2019 FINDINGS: The cardiomediastinal contours are normal. The lungs are clear. Pulmonary vasculature is normal. No consolidation, pleural effusion, or pneumothorax. Scoliotic curvature of the spine. No acute osseous abnormalities are seen. IMPRESSION: No acute chest findings. Electronically Signed   By: Keith Rake M.D.   On: 03/20/2019 01:21   Ct Angio Chest Pe W/cm &/or Wo Cm  Result Date: 03/20/2019 CLINICAL DATA:  Chest pain and elevated D-dimer EXAM: CT ANGIOGRAPHY CHEST WITH CONTRAST TECHNIQUE: Multidetector CT imaging of the chest was performed using the standard protocol during bolus administration of intravenous contrast. Multiplanar CT image reconstructions and MIPs were obtained to evaluate the vascular anatomy. CONTRAST:  143mL OMNIPAQUE IOHEXOL 350 MG/ML SOLN COMPARISON:  Chest radiograph March 20, 2019; CT  angiogram chest January 12, 2019 FINDINGS: Cardiovascular: There is no demonstrable pulmonary embolus. There is no appreciable thoracic aortic aneurysm or dissection. Visualized great vessels appear unremarkable. There is no pericardial effusion or pericardial thickening. Mediastinum/Nodes: Thyroid appears diminutive. No thyroid lesions evident. There are several axillary lymph nodes bilaterally which are upper limits normal in size, stable. By size criteria there is no frank adenopathy in the thoracic region. There is a small hiatal hernia. Lungs/Pleura: No evident edema or consolidation. No appreciable pleural effusions. Upper Abdomen: Visualized upper abdominal structures appear unremarkable. Musculoskeletal: There is a mixed sclerotic and lucent appearance at each sternoclavicular joint and involving the upper manubrium, stable. No associated air within the sternoclavicular joints. Bony structures otherwise appear unremarkable. No chest wall lesions evident. Review of the MIP images confirms the above findings. IMPRESSION: 1. No demonstrable pulmonary embolus. No thoracic aortic aneurysm or dissection. 2.  No edema or consolidation. 3. Stable upper normal in size axillary lymph nodes. No frank adenopathy. 4. 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 This finding was also present on the previous study and appears essentially stable. No similar arthropathic type changes elsewhere. Electronically Signed   By: Lowella Grip III M.D.   On: 03/20/2019 10:21    Procedures Procedures (including critical care time)  Medications Ordered in ED  Medications  amLODipine (NORVASC) tablet 5 mg (5 mg Oral Given 03/20/19 0837)  iohexol (OMNIPAQUE) 350 MG/ML injection 100 mL (100 mLs Intravenous Contrast Given 03/20/19 0956)     Initial Impression / Assessment and Plan / ED Course  I have reviewed the triage vital signs and the nursing  notes.  Pertinent labs & imaging results that were available during my care of the patient were reviewed by me and considered in my medical decision making (see chart for details).        Patient here for evaluation of one month of intermittent chest pain. EKG without acute ischemic changes. Troponin is negative. D dimer was elevated and CTA was obtained, which was negative for PE. Discussed with patient unclear source of her symptoms. Discussed importance of outpatient follow-up as well as return precautions  Final Clinical Impressions(s) / ED Diagnoses   Final diagnoses:  Atypical chest pain    ED Discharge Orders    None       Quintella Reichert, MD 03/20/19 [phone]

## 2019-03-20 NOTE — ED Triage Notes (Signed)
Per EMS, pt from home, was woken out of sleep w/ centralized sharp non-radiating chest pain.  Pain was initially 7/10, took one nitro, no change, called EMS.  EMS gave another nitro, bp went from 170s to 130s, pain decreased to 6/10.

## 2019-03-20 NOTE — ED Notes (Signed)
Pt back from CT

## 2019-03-21 NOTE — Progress Notes (Deleted)
Office Visit    Patient Name: [name] Date of Encounter: 03/21/2019  Primary Care Provider:  Nolene Ebbs, MD Primary Cardiologist:  Shirlee More, MD Electrophysiologist:  None   Chief Complaint    [name] is a 53 y.o. female with a hx of chest pain, HTN, DM2, lupus, psoriatic arthritis, nasopharyngeal cancer s/p surgery/chemo/XRT presents today for follow up after recurrent ED visit for chest pain and echocardiogram.   Past Medical History    Past Medical History:  Diagnosis Date  . Allergy   . Anxiety   . Arthritis    ra, lupus - ankles, knees, hands, neck  . Blood transfusion without reported diagnosis [place] with c/s surgery  . Bronchitis    Hx  . Cancer (HCC)    nasopharynx  . COPD (chronic obstructive pulmonary disease) ([place])   . DDD (degenerative disc disease), lumbar   . Diabetes mellitus without complication ([place])    related to prednisone use, no meds,cbg normal  . Diverticulosis   . Excessive or frequent menstruation   . GERD (gastroesophageal reflux disease)   . Headache(784.0)   . Hypothyroidism   . Irregular menstrual cycle   . Leiomyoma of uterus, unspecified   . Lupus ([place])   . Migraines   . Neuromuscular disorder (HCC)    neuropathy in legs/feet  . Neuropathy   . Papanicolaou smear of cervix with atypical squamous cells of undetermined significance (ASC-US)   . Psoriasis   . Sinusitis   . Trichimoniasis   . Wears partial dentures    full upper and lower partial   Past Surgical History:  Procedure Laterality Date  . ANKLE ARTHROSCOPY Right   . CESAREAN SECTION     x 1  . DILATION AND CURETTAGE OF UTERUS     x 1 Missed Abortion  . ESOPHAGEAL DILATION    . LYMPH NODE BIOPSY     neck  . MULTIPLE EXTRACTIONS WITH ALVEOLOPLASTY N/A 03/05/2013   Procedure: MULTIPLE EXTRACION WITH ALVEOLOPLASTY, extraction of decayed teeth numbers 3,4,5,18,19,22,23,24,25, extraction of retained root tips teeth numbers  2,20,21,26, bilateral mandibular alveoloplasty and upper right maxillary alveoloplasty;  Surgeon: Isac Caddy, DDS;  Location: [facility];  Service: Oral Surgery;  Laterality: N/A;  . scalp biopsy    . TOOTH EXTRACTION Bilateral 03/05/2013   Procedure: EXTRACTION MOLARS;  Surgeon: Isac Caddy, DDS;  Location: [facility];  Service: Oral Surgery;  Laterality: Bilateral;    Allergies  Allergies  Allergen Reactions  . Amoxicillin Itching    Did it involve swelling of the face/tongue/throat, SOB, or low BP? No Did it involve sudden or severe rash/hives, skin peeling, or any reaction on the inside of your mouth or nose? Yes Did you need to seek medical attention at a hospital or doctor's office? Yes When did it last happen?14 yrs ago If all above answers are "NO", may proceed with cephalosporin use.   . Latex Itching  . Percocet [Oxycodone-Acetaminophen] Itching  . Plaquenil [Hydroxychloroquine Sulfate] Other (See Comments)    "Psoriasis."  . Bactrim [Sulfamethoxazole-Trimethoprim] Rash  . Ciprofloxacin Hcl Rash  . Keflex [Cephalexin] Rash  . Penicillins Rash    Did it involve swelling of the face/tongue/throat, SOB, or low BP? No Did it involve sudden or severe rash/hives, skin peeling, or any reaction on the inside of your mouth or nose? Yes Did you need to seek medical attention at a hospital or doctor's office? Yes When did it last happen?[place]  yrs ago If all above answers are "NO", may proceed with cephalosporin use.   . Tetracyclines & Related Rash    History of Present Illness    [name] is a 53 y.o. female with a hx of *** last seen by Dr. Bettina Gavia 02/28/19.  EKGs/Labs/Other Studies Reviewed:   The following studies were reviewed today:  Echo 03/07/19  1. The left ventricle has normal systolic function, with an ejection fraction of 55-60%. The cavity size was normal. There is moderately increased left ventricular wall thickness. Left ventricular  diastolic Doppler parameters are consistent with  impaired relaxation.  2. The right ventricle has normal systolic function. The cavity was normal. There is no increase in right ventricular wall thickness.  3. The aortic valve is tricuspid.  4. The aorta is normal unless otherwise noted.  EKG:  No EKG today. EKG independently reviewed 03/20/19 shows ST with no acute ST/T wave changes.  Recent Labs: 08/16/2018: TSH 1.27 01/17/2019: ALT 19 03/20/2019: BUN 16; Creatinine, Ser 0.87; Hemoglobin 10.7; Platelets 286; Potassium 3.4; Sodium 141  Recent Lipid Panel No results found for: CHOL, TRIG, HDL, CHOLHDL, VLDL, LDLCALC, LDLDIRECT  Home Medications   No outpatient medications have been marked as taking for the 03/23/19 encounter (Appointment) with Loel Dubonnet, NP.    Review of Systems      ROS All other systems reviewed and are otherwise negative except as noted above.  Physical Exam    VS:  LMP 06/25/2016  , BMI There is no height or weight on file to calculate BMI. GEN: Well nourished, well developed, in no acute distress. HEENT: normal. Neck: Supple, no JVD, carotid bruits, or masses. Cardiac: ***RRR, no murmurs, rubs, or gallops. No clubbing, cyanosis, edema.  ***Radials/DP/PT 2+ and equal bilaterally.  Respiratory:  ***Respirations regular and unlabored, clear to auscultation bilaterally. GI: Soft, nontender, nondistended, BS + x 4. MS: No deformity or atrophy. Skin: Warm and dry, no rash. Neuro:  Strength and sensation are intact. Psych: Normal affect.  Assessment & Plan    1. ***  Disposition: Follow up {follow up:15908} with Dr. Bettina Gavia.   Loel Dubonnet, NP 03/21/2019, 12:48 PM

## 2019-03-22 ENCOUNTER — Telehealth (HOSPITAL_COMMUNITY): Payer: Self-pay | Admitting: Emergency Medicine

## 2019-03-22 NOTE — Telephone Encounter (Signed)
Reaching out to patient to offer assistance regarding upcoming cardiac imaging study; pt verbalizes understanding of appt date/time, parking situation and where to check in, pre-test NPO status and medications ordered, and verified current allergies; name and call back number provided for further questions should they arise Samella Lucchetti RN Navigator Cardiac Imaging [facility] [phone] office [phone] cell 

## 2019-03-23 ENCOUNTER — Ambulatory Visit (HOSPITAL_COMMUNITY)
Admission: RE | Admit: 2019-03-23 | Discharge: 2019-03-23 | Disposition: A | Discharge status: Home / Self Care | Payer: Medicare Other | Source: Ambulatory Visit | Attending: Cardiology | Admitting: Cardiology

## 2019-03-23 ENCOUNTER — Ambulatory Visit (INDEPENDENT_AMBULATORY_CARE_PROVIDER_SITE_OTHER): Payer: Medicare Other | Admitting: Family

## 2019-03-23 ENCOUNTER — Ambulatory Visit: Payer: Medicare Other | Admitting: Family

## 2019-03-23 ENCOUNTER — Other Ambulatory Visit: Payer: Self-pay

## 2019-03-23 ENCOUNTER — Encounter: Payer: Self-pay | Admitting: Family

## 2019-03-23 VITALS — BP 122/60 | HR 87 | Ht 66.0 in | Wt 151.2 lb

## 2019-03-23 DIAGNOSIS — I10 Essential (primary) hypertension: Secondary | ICD-10-CM

## 2019-03-23 DIAGNOSIS — E039 Hypothyroidism, unspecified: Secondary | ICD-10-CM | POA: Insufficient documentation

## 2019-03-23 DIAGNOSIS — Z833 Family history of diabetes mellitus: Secondary | ICD-10-CM | POA: Diagnosis not present

## 2019-03-23 DIAGNOSIS — K219 Gastro-esophageal reflux disease without esophagitis: Secondary | ICD-10-CM | POA: Insufficient documentation

## 2019-03-23 DIAGNOSIS — R531 Weakness: Secondary | ICD-10-CM | POA: Diagnosis not present

## 2019-03-23 DIAGNOSIS — Z87891 Personal history of nicotine dependence: Secondary | ICD-10-CM | POA: Diagnosis not present

## 2019-03-23 DIAGNOSIS — R0600 Dyspnea, unspecified: Secondary | ICD-10-CM

## 2019-03-23 DIAGNOSIS — Z79890 Hormone replacement therapy (postmenopausal): Secondary | ICD-10-CM | POA: Insufficient documentation

## 2019-03-23 DIAGNOSIS — Z8249 Family history of ischemic heart disease and other diseases of the circulatory system: Secondary | ICD-10-CM | POA: Insufficient documentation

## 2019-03-23 DIAGNOSIS — R0609 Other forms of dyspnea: Secondary | ICD-10-CM

## 2019-03-23 DIAGNOSIS — R079 Chest pain, unspecified: Secondary | ICD-10-CM | POA: Insufficient documentation

## 2019-03-23 DIAGNOSIS — J449 Chronic obstructive pulmonary disease, unspecified: Secondary | ICD-10-CM | POA: Diagnosis not present

## 2019-03-23 DIAGNOSIS — E119 Type 2 diabetes mellitus without complications: Secondary | ICD-10-CM | POA: Insufficient documentation

## 2019-03-23 DIAGNOSIS — Z79899 Other long term (current) drug therapy: Secondary | ICD-10-CM | POA: Insufficient documentation

## 2019-03-23 MED ORDER — IVABRADINE HCL 7.5 MG PO TABS
7.5000 mg | ORAL_TABLET | Freq: Once | ORAL | Status: AC
  Administered 2019-03-23: 12:00:00 7.5 mg via ORAL
  Filled 2019-03-23: qty 1

## 2019-03-23 MED ORDER — NITROGLYCERIN 0.4 MG SL SUBL
SUBLINGUAL_TABLET | SUBLINGUAL | Status: AC
  Administered 2019-03-23: 13:00:00 0.4 mg
  Filled 2019-03-23: qty 1

## 2019-03-23 MED ORDER — METOPROLOL TARTRATE 5 MG/5ML IV SOLN
INTRAVENOUS | Status: AC
  Filled 2019-03-23: qty 20

## 2019-03-23 MED ORDER — METOPROLOL TARTRATE 5 MG/5ML IV SOLN
5.0000 mg | INTRAVENOUS | Status: DC | PRN
  Filled 2019-03-23: qty 5

## 2019-03-23 MED ORDER — IOHEXOL 350 MG/ML SOLN
80.0000 mL | Freq: Once | INTRAVENOUS | Status: AC | PRN
  Administered 2019-03-23: 13:00:00 80 mL via INTRAVENOUS

## 2019-03-23 NOTE — Patient Instructions (Addendum)
Medication Instructions:  No medication changes today.   Try taking an additional iron tablet in the evening three days a week as your hemoglobin had decreased when you were in the ED 03/20/19.    If you need a refill on your cardiac medications before your next appointment, please call your pharmacy.   Lab work: No lab work today.   If you have labs (blood work) drawn today and your tests are completely normal, you will receive your results only by: Marland Kitchen MyChart Message (if you have MyChart) OR . A paper copy in the mail If you have any lab test that is abnormal or we need to change your treatment, we will call you to review the results.  Testing/Procedures: None ordered today.   Follow-Up: At [facility], you and your health needs are our priority.  As part of our continuing mission to provide you with exceptional heart care, we have created designated Provider Care Teams.  These Care Teams include your primary Cardiologist (physician) and Advanced Practice Providers (APPs -  Physician Assistants and Nurse Practitioners) who all work together to provide you with the care you need, when you need it. You will need a follow up appointment in 3 weeks.  You may see Shirlee More, MD or another member of our [place] Provider Team in [place]: Jenne Campus, MD . Jyl Heinz, MD . Berniece Salines, MD  Any Other Special Instructions Will Be Listed Below (If Applicable).

## 2019-03-23 NOTE — Progress Notes (Addendum)
Patient ID: [name], female   DOB: 02-16-1966, 53 y.o.   MRN: [id number]   After further discussion with Dr. Debara Pickett, we collectively decided to try 7.5mg  ivabradine instead of 15mg . Will need to re-assess HR after 90-187mins after administration for effectiveness.   If ivabradine is ineffective after 90-157mins, the test will be cancelled  Marchia Bond

## 2019-03-23 NOTE — Progress Notes (Signed)
Ct complete. Patient denies any complaints. Offered patient snack and beverage.

## 2019-03-23 NOTE — Progress Notes (Signed)
Writer called Dr. Debara Pickett to inform of patients blood pressure. Per Dr. Debara Pickett, patient is to receive Ivabradine 15mg  po. Wait 1 hour then call back for further instructions.   CT Navigator - [place]. Informed.   Patient updated and verbalizes understanding and agrees.
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
## 2019-03-26 ENCOUNTER — Telehealth: Payer: Self-pay | Admitting: Cardiology

## 2019-03-26 NOTE — Telephone Encounter (Signed)
Wants to discuss test results in more detail, she states she is confused

## 2019-03-26 NOTE — Telephone Encounter (Signed)
[name], mother of patient [phone]) is demanding to speak to someone regarding patient's test results.  She is not on the patient's DPR and I have explained that her, several times but she is still very insistent.  She asked asked if her daughter, the patient could give a verbal over the phone for her to speak to someone and I told her I would have to clear that with the Dr Oren Binet clinical team.  Please call patient and/* No surgery found * mother back to discuss the CT results

## 2019-03-27 ENCOUNTER — Ambulatory Visit (INDEPENDENT_AMBULATORY_CARE_PROVIDER_SITE_OTHER): Payer: Medicare Other | Admitting: Psychology

## 2019-03-27 DIAGNOSIS — F418 Other specified anxiety disorders: Secondary | ICD-10-CM

## 2019-03-27 NOTE — Telephone Encounter (Signed)
Phone call returned to patient and again it was explained to the patient she should complete a DPR giving our office permission to speak with her mother.  Until she has completed a DPR we would only speak with the patient regarding her care.  Patient had questions regarding Cardiac CTA that was done.  Questions were answered to the best of my ability, and patient was advised of appointment with Dr Bettina Gavia on 04-13-2019 to further discuss her questions.  Patient agreed to plan and verbalized understanding.  No further questions.

## 2019-04-13 ENCOUNTER — Ambulatory Visit (INDEPENDENT_AMBULATORY_CARE_PROVIDER_SITE_OTHER): Payer: Medicare Other | Admitting: Family

## 2019-04-13 ENCOUNTER — Encounter: Payer: Self-pay | Admitting: Family

## 2019-04-13 ENCOUNTER — Ambulatory Visit (INDEPENDENT_AMBULATORY_CARE_PROVIDER_SITE_OTHER): Payer: Medicare Other

## 2019-04-13 ENCOUNTER — Other Ambulatory Visit: Payer: Self-pay

## 2019-04-13 VITALS — BP 92/64 | HR 82 | Ht 66.0 in | Wt 153.1 lb

## 2019-04-13 DIAGNOSIS — I10 Essential (primary) hypertension: Secondary | ICD-10-CM

## 2019-04-13 DIAGNOSIS — R079 Chest pain, unspecified: Secondary | ICD-10-CM | POA: Diagnosis not present

## 2019-04-13 DIAGNOSIS — R002 Palpitations: Secondary | ICD-10-CM

## 2019-04-13 NOTE — Patient Instructions (Addendum)
Medication Instructions:   Your physician has recommended you make the following change in your medication:   STOP: Amlodipine  *If you need a refill on your cardiac medications before your next appointment, please call your pharmacy*  Lab Work: None ORdered   Testing/Procedures: Your physician has recommended that you wear a ZIO monitor for 7 DAYS. Holter monitors are medical devices that record the heart's electrical activity. Doctors most often use these monitors to diagnose arrhythmias. Arrhythmias are problems with the speed or rhythm of the heartbeat. The monitor is a small, portable device. You can wear one while you do your normal daily activities. This is usually used to diagnose what is causing palpitations/syncope (passing out).  Check Blood Pressure Daily. Please call our office if reading is consistently greater than 130/80.   Follow-Up: At [facility], you and your health needs are our priority.  As part of our continuing mission to provide you with exceptional heart care, we have created designated Provider Care Teams.  These Care Teams include your primary Cardiologist (physician) and Advanced Practice Providers (APPs -  Physician Assistants and Nurse Practitioners) who all work together to provide you with the care you need, when you need it.  Your next appointment:   7 Weeks  The format for your next appointment:   In Person  Provider:   Shirlee More, MD  Other Instructions   How to Take Your Blood Pressure You can take your blood pressure at home with a machine. You may need to check your blood pressure at home:  To check if you have high blood pressure (hypertension).  To check your blood pressure over time.  To make sure your blood pressure medicine is working. Supplies needed: You will need a blood pressure machine, or monitor. You can buy one at a drugstore or online. When choosing one:  Choose one with an arm cuff.  Choose one that wraps around  your upper arm. Only one finger should fit between your arm and the cuff.  Do not choose one that measures your blood pressure from your wrist or finger. Your doctor can suggest a monitor. How to prepare Avoid these things for 30 minutes before checking your blood pressure:  Drinking caffeine.  Drinking alcohol.  Eating.  Smoking.  Exercising. Five minutes before checking your blood pressure:  Pee.  Sit in a dining chair. Avoid sitting in a soft couch or armchair.  Be quiet. Do not talk. How to take your blood pressure Follow the instructions that came with your machine. If you have a digital blood pressure monitor, these may be the instructions: 1. Sit up straight. 2. Place your feet on the floor. Do not cross your ankles or legs. 3. Rest your left arm at the level of your heart. You may rest it on a table, desk, or chair. 4. Pull up your shirt sleeve. 5. Wrap the blood pressure cuff around the upper part of your left arm. The cuff should be 1 inch (2.5 cm) above your elbow. It is best to wrap the cuff around bare skin. 6. Fit the cuff snugly around your arm. You should be able to place only one finger between the cuff and your arm. 7. Put the cord inside the groove of your elbow. 8. Press the power button. 9. Sit quietly while the cuff fills with air and loses air. 10. Write down the numbers on the screen. 11. Wait 2-3 minutes and then repeat steps 1-10. What do the numbers mean?  Two numbers make up your blood pressure. The first number is called systolic pressure. The second is called diastolic pressure. An example of a blood pressure reading is "120 over 80" (or 120/80). If you are an adult and do not have a medical condition, use this guide to find out if your blood pressure is normal: Normal  First number: below 120.  Second number: below 80. Elevated  First number: 120-129.  Second number: below 80. Hypertension stage 1  First number: 130-139.  Second  number: 80-89. Hypertension stage 2  First number: 140 or above.  Second number: 3 or above. Your blood pressure is above normal even if only the top or bottom number is above normal. Follow these instructions at home:  Check your blood pressure as often as your doctor tells you to.  Take your monitor to your next doctor's appointment. Your doctor will: ? Make sure you are using it correctly. ? Make sure it is working right.  Make sure you understand what your blood pressure numbers should be.  Tell your doctor if your medicines are causing side effects. Contact a doctor if:  Your blood pressure keeps being high. Get help right away if:  Your first blood pressure number is higher than 180.  Your second blood pressure number is higher than 120. This information is not intended to replace advice given to you by your health care provider. Make sure you discuss any questions you have with your health care provider. Document Released: 05/20/2008 Document Revised: 05/20/2017 Document Reviewed: 11/14/2015 Elsevier Patient Education  2020 [place].    Ambulatory Cardiac Monitoring An ambulatory cardiac monitor is a small recording device that is used to detect abnormal heart rhythms (arrhythmias). Most monitors are connected by wires to flat, sticky disks (electrodes) that are then attached to your chest. You may need to wear a monitor if you have had symptoms such as:  Fast heartbeats (palpitations).  Dizziness.  Fainting or light-headedness.  Unexplained weakness.  Shortness of breath. There are several types of monitors. Some common monitors include:  Holter monitor. This records your heart rhythm continuously, usually for 24-48 hours.  Event (episodic) monitor. This monitor has a symptoms button, and when pushed, it will begin recording. You need to activate this monitor to record when you have a heart-related symptom.  Automatic detection monitor. This monitor  will begin recording when it detects an abnormal heartbeat. What are the risks? Generally, these devices are safe to use. However, it is possible that the skin under the electrodes will become irritated. How to prepare for monitoring Your health care provider will prepare your chest for the electrode placement and show you how to use the monitor.  Do not apply lotions to your chest before monitoring.  Follow directions on how to care for the monitor, and how to return the monitor when the testing period is complete. How to use your cardiac monitor  Follow directions about how long to wear the monitor, and if you can take the monitor off in order to shower or bathe. ? Do not let the monitor get wet. ? Do not bathe, swim, or use a hot tub while wearing the monitor.  Keep your skin clean. Do not put body lotion or moisturizer on your chest.  Change the electrodes as told by your health care provider, or any time they stop sticking to your skin. You may need to use medical tape to keep them on.  Try to put the electrodes in  slightly different places on your chest to help prevent skin irritation. Follow directions from your health care provider about where to place the electrodes.  Make sure the monitor is safely clipped to your clothing or in a location close to your body as recommended by your health care provider.  If your monitor has a symptoms button, press the button to mark an event as soon as you feel a heart-related symptom, such as: ? Dizziness. ? Weakness. ? Light-headedness. ? Palpitations. ? Thumping or pounding in your chest. ? Shortness of breath. ? Unexplained weakness.  Keep a diary of your activities, such as walking, doing chores, and taking medicine. It is very important to note what you were doing when you pushed the button to record your symptoms. This will help your health care provider determine what might be contributing to your symptoms.  Send the recorded  information as recommended by your health care provider. It may take some time for your health care provider to process the results.  Change the batteries as told by your health care provider.  Keep electronic devices away from your monitor. These include: ? Tablets. ? MP3 players. ? Cell phones.  While wearing your monitor you should avoid: ? Electric blankets. ? Armed forces operational officer. ? Electric toothbrushes. ? Microwave ovens. ? Magnets. ? Metal detectors. Get help right away if:  You have chest pain.  You have shortness of breath or extreme difficulty breathing.  You develop a very fast heartbeat that does not get better.  You develop dizziness that does not go away.  You faint or constantly feel like you are about to faint. Summary  An ambulatory cardiac monitor is a small recording device that is used to detect abnormal heart rhythms (arrhythmias).  Make sure you understand how to send the information from the monitor to your health care provider.  It is important to press the button on the monitor when you have any heart-related symptoms.  Keep a diary of your activities, such as walking, doing chores, and taking medicine. It is very important to note what you were doing when you pushed the button to record your symptoms. This will help your health care provider learn what might be causing your symptoms. This information is not intended to replace advice given to you by your health care provider. Make sure you discuss any questions you have with your health care provider. Document Released: 03/16/2008 Document Revised: 05/20/2017 Document Reviewed: 05/22/2016 Elsevier Patient Education  2020 [place].   Blood Pressure Record Sheet To take your blood pressure, you will need a blood pressure machine. You can buy a blood pressure machine (blood pressure monitor) at your clinic, drug store, or online. When choosing one, consider:  An automatic monitor that has an arm  cuff.  A cuff that wraps snugly around your upper arm. You should be able to fit only one finger between your arm and the cuff.  A device that stores blood pressure reading results.  Do not choose a monitor that measures your blood pressure from your wrist or finger. Follow your health care provider's instructions for how to take your blood pressure. To use this form:  Get one reading in the morning (a.m.) before you take any medicines.  Get one reading in the evening (p.m.) before supper.  Take at least 2 readings with each blood pressure check. This makes sure the results are correct. Wait 1-2 minutes between measurements.  Write down the results in the spaces on this  form.  Repeat this once a week, or as told by your health care provider.  Make a follow-up appointment with your health care provider to discuss the results. Blood pressure log Date: _______________________  a.m. _____________________(1st reading) _____________________(2nd reading)  p.m. _____________________(1st reading) _____________________(2nd reading) Date: _______________________  a.m. _____________________(1st reading) _____________________(2nd reading)  p.m. _____________________(1st reading) _____________________(2nd reading) Date: _______________________  a.m. _____________________(1st reading) _____________________(2nd reading)  p.m. _____________________(1st reading) _____________________(2nd reading) Date: _______________________  a.m. _____________________(1st reading) _____________________(2nd reading)  p.m. _____________________(1st reading) _____________________(2nd reading) Date: _______________________  a.m. _____________________(1st reading) _____________________(2nd reading)  p.m. _____________________(1st reading) _____________________(2nd reading) This information is not intended to replace advice given to you by your health care provider. Make sure you discuss any questions you  have with your health care provider. Document Released: 03/06/2003 Document Revised: 08/05/2017 Document Reviewed: 06/07/2017 Elsevier Patient Education  2020 [employer].
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
## 2019-04-14 ENCOUNTER — Encounter: Payer: Self-pay | Admitting: Family

## 2019-05-01 NOTE — Addendum Note (Signed)
Addended by: Aleatha Borer on: 05/01/2019 01:48 PM   Modules accepted: Orders

## 2019-05-02 ENCOUNTER — Ambulatory Visit: Payer: Medicare Other | Admitting: Cardiology

## 2019-05-22 ENCOUNTER — Other Ambulatory Visit: Payer: Self-pay | Admitting: Internal Medicine

## 2019-05-22 DIAGNOSIS — Z1231 Encounter for screening mammogram for malignant neoplasm of breast: Secondary | ICD-10-CM

## 2019-06-05 ENCOUNTER — Other Ambulatory Visit: Payer: Self-pay

## 2019-06-05 ENCOUNTER — Ambulatory Visit
Admission: RE | Admit: 2019-06-05 | Discharge: 2019-06-05 | Disposition: A | Discharge status: Home / Self Care | Payer: Medicare Other | Source: Ambulatory Visit | Attending: Internal Medicine | Admitting: Internal Medicine

## 2019-06-05 DIAGNOSIS — Z1231 Encounter for screening mammogram for malignant neoplasm of breast: Secondary | ICD-10-CM

## 2019-06-07 NOTE — Progress Notes (Signed)
Cardiology Office Note:    Date:  06/08/2019   ID:  [name], DOB [birth year], MRN [id number]  PCP:  Nolene Ebbs, MD  Cardiologist:  Shirlee More, MD    Referring MD: Nolene Ebbs, MD    ASSESSMENT:    1. Palpitations   2. Hypotension due to drugs   3. Essential hypertension   4. Chest pain in adult   5. Educated about COVID-19 virus infection    PLAN:    In order of problems listed above:  1. Improved continue low-dose beta-blocker she is now off her calcium channel blocker and hypotension resolved. 2. BP at target continue minimal dose of beta-blocker she has not taken diuretic at this time 3. Her cardiac evaluation is reassuring, normal cardiac CTA echocardiogram she is reassured asymptomatic at this time I perform no further cardiac evaluation I will plan to see back in the office as needed 4. With a high prevalence of COVID-19 I asked her to start wearing medical eye protectors in public especially in stores with commercial ventilation systems discussed and advised her to accept vaccine   Next appointment: As needed   Medication Adjustments/Labs and Tests Ordered: Current medicines are reviewed at length with the patient today.  Concerns regarding medicines are outlined above.  No orders of the defined types were placed in this encounter.  No orders of the defined types were placed in this encounter.   Chief Complaint  Patient presents with  . Follow-up    7 Week Follow Up    History of Present Illness:    [name] is a 53 y.o. female with a hx of chest pain, DM2, psoriasiatic arthritis last seen 04/13/2019 with hypotension and palpitation. Compliance with diet, lifestyle and medications: Yes  Study Highlights  A ZIO monitor was performed beginning 04/13/2019 for 7 days and 1 hour to evaluate palpitation. The heart rhythm throughout recording was sinus with minimum average and maximum heart rates of 59, 85 and 152 bpm.  Rest  morphology is normal. There were no pauses of 3 seconds or greater and no episodes of AV nodal or sinus node block. Ventricular ectopy was rare with isolated PVCs Supraventricular ectopy was rare with isolated APCs.  There was one 4 beat run of atrial premature contractions that was asymptomatic.  There were no episodes of atrial fibrillation or flutter. There were 8 triggered events all sinus rhythm.  There were 4 diary events of palpitation associated with sinus rhythm.  Conclusion, normal 7-day ZIO monitor without significant arrhythmia, note the triggered and diary events did not show arrhythmia.    Cardiac CTA 03/23/19 IMPRESSION: 1. No evidence of CAD, CADRADS = 0. 2. Coronary calcium score of 0. This was 0 percentile for age and sex matched control. 3. Normal coronary origin with right dominance. 4. Borderline dilated pulmonary artery at 27 mm.  Echo 02/2019 1. The left ventricle has normal systolic function, with an ejection fraction of 55-60%. The cavity size was normal. There is moderately increased left ventricular wall thickness. Left ventricular diastolic Doppler parameters are consistent with  impaired relaxation. 2. The right ventricle has normal systolic function. The cavity was normal. There is no increase in right ventricular wall thickness. 3. The aortic valve is tricuspid. 4. The aorta is normal unless otherwise noted.  She is improved and attributes it to her faith.  Off calcium channel blocker she is not lightheaded repeat blood pressure by me 100/70.  She takes a minimum dose of beta-blocker and  she is not having palpitation.  I reinforced with her the normal cardiac CTA and echocardiogram and provided reassurance.  I will plan to see back in the office as needed.  She has not been taking furosemide recently. Past Medical History:  Diagnosis Date  . Allergy   . Anxiety   . Arthritis    ra, lupus - ankles, knees, hands, neck  . Blood transfusion without  reported diagnosis [place] with c/s surgery  . Bronchitis    Hx  . Cancer (HCC)    nasopharynx  . COPD (chronic obstructive pulmonary disease) ([place])   . DDD (degenerative disc disease), lumbar   . Diabetes mellitus without complication ([place])    related to prednisone use, no meds,cbg normal  . Diverticulosis   . Excessive or frequent menstruation   . GERD (gastroesophageal reflux disease)   . Headache(784.0)   . Hypothyroidism   . Irregular menstrual cycle   . Leiomyoma of uterus, unspecified   . Lupus ([place])   . Migraines   . Neuromuscular disorder (HCC)    neuropathy in legs/feet  . Neuropathy   . Papanicolaou smear of cervix with atypical squamous cells of undetermined significance (ASC-US)   . Psoriasis   . Sinusitis   . Trichimoniasis   . Wears partial dentures    full upper and lower partial    Past Surgical History:  Procedure Laterality Date  . ANKLE ARTHROSCOPY Right   . CESAREAN SECTION     x 1  . DILATION AND CURETTAGE OF UTERUS     x 1 Missed Abortion  . ESOPHAGEAL DILATION    . LYMPH NODE BIOPSY     neck  . MULTIPLE EXTRACTIONS WITH ALVEOLOPLASTY N/A 03/05/2013   Procedure: MULTIPLE EXTRACION WITH ALVEOLOPLASTY, extraction of decayed teeth numbers 3,4,5,18,19,22,23,24,25, extraction of retained root tips teeth numbers 2,20,21,26, bilateral mandibular alveoloplasty and upper right maxillary alveoloplasty;  Surgeon: Isac Caddy, DDS;  Location: [facility];  Service: Oral Surgery;  Laterality: N/A;  . scalp biopsy    . TOOTH EXTRACTION Bilateral 03/05/2013   Procedure: EXTRACTION MOLARS;  Surgeon: Isac Caddy, DDS;  Location: [facility];  Service: Oral Surgery;  Laterality: Bilateral;    Current Medications: Current Meds  Medication Sig  . albuterol (PROVENTIL HFA;VENTOLIN HFA) 108 (90 BASE) MCG/ACT inhaler Inhale 2 puffs into the lungs every 6 (six) hours as needed for wheezing or shortness of breath.   Marland Kitchen albuterol (PROVENTIL) (2.5 MG/3ML)  0.083% nebulizer solution Take 2.5 mg by nebulization every 6 (six) hours as needed for wheezing or shortness of breath. Reported on 10/27/2015  . amLODipine (NORVASC) 5 MG tablet Take 1 tablet (5 mg total) by mouth daily.  . Ascorbic Acid (VITAMIN C) 1000 MG tablet Take 1,000 mg by mouth 2 (two) times daily.  . calcium-vitamin D (OSCAL WITH D) 500-200 MG-UNIT per tablet Take 1 tablet by mouth 2 (two) times daily.  . cetirizine (ZYRTEC) 10 MG tablet Take 10 mg by mouth daily.  . COSENTYX 300 DOSE 150 MG/ML SOSY Inject 300 mg into the muscle every 28 (twenty-eight) days.   . diclofenac (VOLTAREN) 75 MG EC tablet Take 1 tablet by mouth 2 (two) times daily.  . DULoxetine (CYMBALTA) 30 MG capsule Take 1 capsule by mouth daily.  . ferrous sulfate 325 (65 FE) MG tablet Take 325 mg by mouth daily with breakfast.  . furosemide (LASIX) 40 MG tablet Take 40 mg by mouth daily as needed for fluid or edema.   Marland Kitchen  gabapentin (NEURONTIN) 100 MG capsule Take 100 mg by mouth 3 (three) times daily.   Marland Kitchen glucosamine-chondroitin 500-400 MG tablet Take 1 tablet by mouth 2 (two) times daily.  . hydrOXYzine (ATARAX/VISTARIL) 10 MG tablet Take 10 mg by mouth at bedtime as needed for itching or anxiety. Reported on 10/27/2015  . levothyroxine (SYNTHROID, LEVOTHROID) 75 MCG tablet Take 75 mcg by mouth daily.  Marland Kitchen LORazepam (ATIVAN) 1 MG tablet Take 1 mg by mouth at bedtime.   . metoprolol succinate (TOPROL-XL) 25 MG 24 hr tablet Take 1 tablet (25 mg total) by mouth daily.  . mometasone (NASONEX) 50 MCG/ACT nasal spray Place 2 sprays into the nose daily.   . Multiple Vitamin (MULTIVITAMIN WITH MINERALS) TABS Take 1 tablet by mouth daily.  . pantoprazole (PROTONIX) 40 MG tablet Take 40 mg by mouth daily.   . potassium chloride (K-DUR) 10 MEQ tablet Take 1 tablet by mouth daily.  . risperiDONE (RISPERDAL) 0.5 MG tablet Take 0.5 mg by mouth at bedtime.  . rizatriptan (MAXALT-MLT) 10 MG disintegrating tablet Take 10 mg by mouth as  needed for migraine.   . SYMBICORT 160-4.5 MCG/ACT inhaler Inhale 2 puffs into the lungs 2 (two) times daily.  Marland Kitchen topiramate (TOPAMAX) 100 MG tablet Take 100 mg by mouth at bedtime.   Marland Kitchen VITAMIN E PO Take 400 mg by mouth 2 (two) times daily.      Allergies:   Amoxicillin, Latex, Percocet [oxycodone-acetaminophen], Plaquenil [hydroxychloroquine sulfate], Bactrim [sulfamethoxazole-trimethoprim], Ciprofloxacin hcl, Keflex [cephalexin], Penicillins, and Tetracyclines & related   Social History   Socioeconomic History  . Marital status: Divorced    Spouse name: Not on file  . Number of children: Not on file  . Years of education: Not on file  . Highest education level: Not on file  Occupational History  . Not on file  Tobacco Use  . Smoking status: Former Smoker    Packs/day: 0.25    Years: 8.00    Pack years: 2.00    Types: Cigarettes    Quit date: 02/28/2004    Years since quitting: 15.2  . Smokeless tobacco: Never Used  Substance and Sexual Activity  . Alcohol use: Not Currently    Alcohol/week: 0.0 standard drinks  . Drug use: No  . Sexual activity: Yes    Partners: Male    Birth control/protection: OCP, Pill  Other Topics Concern  . Not on file  Social History Narrative  . Not on file   Social Determinants of Health   Financial Resource Strain:   . Difficulty of Paying Living Expenses: Not on file  Food Insecurity:   . Worried About Charity fundraiser in the Last Year: Not on file  . Ran Out of Food in the Last Year: Not on file  Transportation Needs:   . Lack of Transportation (Medical): Not on file  . Lack of Transportation (Non-Medical): Not on file  Physical Activity:   . Days of Exercise per Week: Not on file  . Minutes of Exercise per Session: Not on file  Stress:   . Feeling of Stress : Not on file  Social Connections:   . Frequency of Communication with Friends and Family: Not on file  . Frequency of Social Gatherings with Friends and Family: Not on file    . Attends Religious Services: Not on file  . Active Member of Clubs or Organizations: Not on file  . Attends Archivist Meetings: Not on file  . Marital Status: Not  on file     Family History: The patient's family history includes Breast cancer (age of onset: 67) in her maternal grandmother; Diabetes in her mother; Hypertension in her father and mother; Lupus in her brother. There is no history of Colon cancer, Esophageal cancer, Rectal cancer, or Stomach cancer. ROS:   Please see the history of present illness.    All other systems reviewed and are negative.  EKGs/Labs/Other Studies Reviewed:    The following studies were reviewed today:  EKG:  EKG 03/20/2019 personally reviewed sinus tachycardia 100 205 bpm otherwise normal  Recent Labs: 08/16/2018: TSH 1.27 01/17/2019: ALT 19 03/20/2019: BUN 16; Creatinine, Ser 0.87; Hemoglobin 10.7; Platelets 286; Potassium 3.4; Sodium 141  Recent Lipid Panel No results found for: CHOL, TRIG, HDL, CHOLHDL, VLDL, LDLCALC, LDLDIRECT  Physical Exam:    VS:  BP 92/64 (BP Location: Right Arm, Patient Position: Sitting, Cuff Size: Normal)   Pulse 79   Ht 5\' 6"  (1.676 m)   Wt 150 lb (68 kg)   LMP 06/25/2016   SpO2 98%   BMI 24.21 kg/m     Wt Readings from Last 3 Encounters:  06/08/19 150 lb (68 kg)  04/13/19 153 lb 1.9 oz (69.5 kg)  03/23/19 151 lb 4 oz (68.6 kg)     GEN:  Well nourished, well developed in no acute distress HEENT: Normal NECK: No JVD; No carotid bruits LYMPHATICS: No lymphadenopathy CARDIAC: RRR, no murmurs, rubs, gallops RESPIRATORY:  Clear to auscultation without rales, wheezing or rhonchi  ABDOMEN: Soft, non-tender, non-distended MUSCULOSKELETAL:  No edema; No deformity  SKIN: Warm and dry NEUROLOGIC:  Alert and oriented x 3 PSYCHIATRIC:  Normal affect    Signed, Shirlee More, MD  06/08/2019 11:26 AM    [place]

## 2019-06-08 ENCOUNTER — Encounter: Payer: Self-pay | Admitting: Cardiology

## 2019-06-08 ENCOUNTER — Ambulatory Visit (INDEPENDENT_AMBULATORY_CARE_PROVIDER_SITE_OTHER): Payer: Medicare Other | Admitting: Cardiology

## 2019-06-08 ENCOUNTER — Other Ambulatory Visit: Payer: Self-pay

## 2019-06-08 VITALS — BP 92/64 | HR 79 | Ht 66.0 in | Wt 150.0 lb

## 2019-06-08 DIAGNOSIS — R079 Chest pain, unspecified: Secondary | ICD-10-CM | POA: Diagnosis not present

## 2019-06-08 DIAGNOSIS — I10 Essential (primary) hypertension: Secondary | ICD-10-CM

## 2019-06-08 DIAGNOSIS — I952 Hypotension due to drugs: Secondary | ICD-10-CM

## 2019-06-08 DIAGNOSIS — Z7189 Other specified counseling: Secondary | ICD-10-CM

## 2019-06-08 DIAGNOSIS — R002 Palpitations: Secondary | ICD-10-CM

## 2019-06-08 NOTE — Patient Instructions (Signed)
Medication Instructions:  Your physician recommends that you continue on your current medications as directed. Please refer to the Current Medication list given to you today.  *If you need a refill on your cardiac medications before your next appointment, please call your pharmacy*  Lab Work: None  If you have labs (blood work) drawn today and your tests are completely normal, you will receive your results only by: . MyChart Message (if you have MyChart) OR . A paper copy in the mail If you have any lab test that is abnormal or we need to change your treatment, we will call you to review the results.  Testing/Procedures: None  Follow-Up: At [facility], you and your health needs are our priority.  As part of our continuing mission to provide you with exceptional heart care, we have created designated Provider Care Teams.  These Care Teams include your primary Cardiologist (physician) and Advanced Practice Providers (APPs -  Physician Assistants and Nurse Practitioners) who all work together to provide you with the care you need, when you need it.  Your next appointment:   As needed if symptoms worsen or fail to improve  The format for your next appointment:   In Person  Provider:   Brian Munley, MD   

## 2019-07-05 ENCOUNTER — Ambulatory Visit: Payer: Medicare Other

## 2019-07-10 ENCOUNTER — Other Ambulatory Visit: Payer: Self-pay

## 2019-07-10 MED ORDER — METOPROLOL SUCCINATE ER 25 MG PO TB24: 25.0000 mg | ORAL_TABLET | Freq: Every day | ORAL | 3 refills | Status: DC

## 2019-07-10 NOTE — Progress Notes (Signed)
Refilled Metoprolol Succ 25 MG. Sent to [facility].

## 2019-07-19 ENCOUNTER — Other Ambulatory Visit: Payer: Self-pay | Admitting: *Deleted

## 2019-07-19 DIAGNOSIS — Z79899 Other long term (current) drug therapy: Secondary | ICD-10-CM

## 2019-07-19 LAB — CBC WITH DIFFERENTIAL/PLATELET
Absolute Monocytes: 392 cells/uL (ref 200–950)
Basophils Absolute: 28 cells/uL (ref 0–200)
Basophils Relative: 0.5 %
Eosinophils Absolute: 112 cells/uL (ref 15–500)
Eosinophils Relative: 2 %
HCT: 33.2 % — ABNORMAL LOW (ref 35.0–45.0)
Hemoglobin: 10.7 g/dL — ABNORMAL LOW (ref 11.7–15.5)
Lymphs Abs: 1786 cells/uL (ref 850–3900)
MCH: 29.6 pg (ref 27.0–33.0)
MCHC: 32.2 g/dL (ref 32.0–36.0)
MCV: 92 fL (ref 80.0–100.0)
MPV: 11.8 fL (ref 7.5–12.5)
Monocytes Relative: 7 %
Neutro Abs: 3282 cells/uL (ref 1500–7800)
Neutrophils Relative %: 58.6 %
Platelets: 283 10*3/uL (ref 140–400)
RBC: 3.61 10*6/uL — ABNORMAL LOW (ref 3.80–5.10)
RDW: 12.1 % (ref 11.0–15.0)
Total Lymphocyte: 31.9 %
WBC: 5.6 10*3/uL (ref 3.8–10.8)

## 2019-07-19 LAB — COMPLETE METABOLIC PANEL WITH GFR
AG Ratio: 1.2 (calc) (ref 1.0–2.5)
ALT: 8 U/L (ref 6–29)
AST: 14 U/L (ref 10–35)
Albumin: 4 g/dL (ref 3.6–5.1)
Alkaline phosphatase (APISO): 60 U/L (ref 37–153)
BUN: 14 mg/dL (ref 7–25)
CO2: 28 mmol/L (ref 20–32)
Calcium: 9.3 mg/dL (ref 8.6–10.4)
Chloride: 107 mmol/L (ref 98–110)
Creat: 0.93 mg/dL (ref 0.50–1.05)
GFR, Est African American: 81 mL/min/{1.73_m2} (ref >=60)
GFR, Est Non African American: 70 mL/min/{1.73_m2} (ref >=60)
Globulin: 3.3 g/dL (calc) (ref 1.9–3.7)
Glucose, Bld: 88 mg/dL (ref 65–99)
Potassium: 3.9 mmol/L (ref 3.5–5.3)
Sodium: 143 mmol/L (ref 135–146)
Total Bilirubin: 0.3 mg/dL (ref 0.2–1.2)
Total Protein: 7.3 g/dL (ref 6.1–8.1)

## 2019-08-10 ENCOUNTER — Ambulatory Visit (INDEPENDENT_AMBULATORY_CARE_PROVIDER_SITE_OTHER): Payer: Medicare Other | Admitting: Psychology

## 2019-08-10 DIAGNOSIS — F418 Other specified anxiety disorders: Secondary | ICD-10-CM | POA: Diagnosis not present

## 2019-08-20 NOTE — Progress Notes (Signed)
Virtual Visit via telephone Note  I connected with [name] on 08/23/19 at  3:45 PM EST by telephone enabled telemedicine application and verified that I am speaking with the correct person using two identifiers.  Location: Patient: Home  Provider: Clinic  This service was conducted via virtual visit.  The patient was located at home. I was located in my office.  Consent was obtained prior to the virtual visit and is aware of possible charges through their insurance for this visit.  The patient is an established patient.  Dr. Estanislado Pandy, MD conducted the virtual visit and Hazel Sams, PA-C acted as scribe during the service.  Office staff helped with scheduling follow up visits after the service was conducted.   I discussed the limitations of evaluation and management by telemedicine and the availability of in person appointments. The patient expressed understanding and agreed to proceed.  CC: Medication monitoring  History of Present Illness: Patient is a 54 year old female with a past medical history of psoriatic arthritis. She is on cosentyx 300 mg sq injections every 28 days.  She has not had any recent psoriatic arthritis flares.  She continues to follow-up with Dr. Sharol Roussel on a regular basis.  She has no active psoriasis at this time.  She is experiencing some stiffness and thickening in the right second digit but denies any joint swelling.  She denies any achilles tendonitis or plantar fasciitis.  Denies any SI Joint pain.    Review of Systems  Constitutional: Positive for malaise/fatigue. Negative for fever.  HENT: Negative for ear pain.   Eyes: Negative for photophobia, pain, discharge and redness.  Respiratory: Negative for cough, shortness of breath and wheezing.   Cardiovascular: Negative for chest pain and palpitations.  Gastrointestinal: Negative for blood in stool, constipation and diarrhea.  Genitourinary: Negative for dysuria.  Musculoskeletal: Positive for joint  pain. Negative for back pain, myalgias and neck pain.  Skin: Negative for rash.  Neurological: Negative for dizziness and headaches.  Psychiatric/Behavioral: Negative for depression. The patient is not nervous/anxious and does not have insomnia.       Observations/Objective: Physical Exam  Constitutional: She is oriented to person, place, and time and well-developed, well-nourished, and in no distress.  HENT:  Head: Normocephalic and atraumatic.  Eyes: Conjunctivae are normal.  Pulmonary/Chest: Effort normal.  Neurological: She is alert and oriented to person, place, and time.  Psychiatric: Mood, memory, affect and judgment normal.   Patient reports morning stiffness for 0 minutes.   Patient denies nocturnal pain.  Difficulty dressing/grooming: Denies Difficulty climbing stairs: Denies Difficulty getting out of chair: Denies Difficulty using hands for taps, buttons, cutlery, and/or writing: Reports   Assessment and Plan: Visit Diagnoses: Psoriatic arthritis (HCC)-She has not had any recent psoriatic arthritis flares.  She has stiffness and synovial thickening in the right 2nd PIP joint.  She is not having any joint swelling at this time.  She is clinically doing well on Cosentyx 300 mg sq injections every 28 days.  She has not missed any doses recently. She has no achilles tendonitis or plantar fasciitis.  She has no SI joint pain at this time.  She has not had any active psoriasis recently. She will continue on Cosentyx 300 mg sq injections every 28 days.  She will continue to see Dr. Sharol Roussel on a regular basis.  She was advised to notify us if she develops increased joint pain or joint swelling.  She will follow up in 5 months.  Psoriasis-She  has no active psoriasis lesions. She has not noticed any nail changes.  She will continue on Cosentyx 300 mg sq injections every 28 days.   High risk medication use - Dr. Sharol Roussel prescribes - Cosentyx 300 mg sq every 28 days.  CBC and CMP were  drawn on 07/19/19.  She is due to update lab work in April and every 3 months. Standing orders are in place.  She is due to update TB testing in July 2021 according to the patient.   Lower back pain, chronic midline without sciatica-She is not having any lower back pain or symptoms of sciatica at this time.   Positive ANA (antinuclear antibody) - ANA 1:40 and nucleolar pattern, negative ENA  negative CCP: She has no other clinical features of autoimmune disease at this time.  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 The patient was provided an opportunity to ask questions and all were answered. The patient agreed with the plan and demonstrated an understanding of the instructions.   The patient was advised to call back or seek an in-person evaluation if the symptoms worsen or if the condition fails to improve as anticipated.  I provided 20 minutes of non-face-to-face time during this encounter.   Bo Merino, MD   Scribed by-  Hazel Sams, PA-C

## 2019-08-23 ENCOUNTER — Encounter: Payer: Self-pay | Admitting: Rheumatology

## 2019-08-23 ENCOUNTER — Other Ambulatory Visit: Payer: Self-pay

## 2019-08-23 ENCOUNTER — Telehealth (INDEPENDENT_AMBULATORY_CARE_PROVIDER_SITE_OTHER): Payer: Medicare Other | Admitting: Rheumatology

## 2019-08-23 DIAGNOSIS — L4050 Arthropathic psoriasis, unspecified: Secondary | ICD-10-CM | POA: Diagnosis not present

## 2019-08-23 DIAGNOSIS — Z8639 Personal history of other endocrine, nutritional and metabolic disease: Secondary | ICD-10-CM

## 2019-08-23 DIAGNOSIS — Z8709 Personal history of other diseases of the respiratory system: Secondary | ICD-10-CM

## 2019-08-23 DIAGNOSIS — C119 Malignant neoplasm of nasopharynx, unspecified: Secondary | ICD-10-CM

## 2019-08-23 DIAGNOSIS — L409 Psoriasis, unspecified: Secondary | ICD-10-CM | POA: Diagnosis not present

## 2019-08-23 DIAGNOSIS — R768 Other specified abnormal immunological findings in serum: Secondary | ICD-10-CM

## 2019-08-23 DIAGNOSIS — N200 Calculus of kidney: Secondary | ICD-10-CM

## 2019-08-23 DIAGNOSIS — Z79899 Other long term (current) drug therapy: Secondary | ICD-10-CM

## 2019-08-23 DIAGNOSIS — Z8719 Personal history of other diseases of the digestive system: Secondary | ICD-10-CM

## 2019-08-23 DIAGNOSIS — Z8659 Personal history of other mental and behavioral disorders: Secondary | ICD-10-CM

## 2019-08-24 ENCOUNTER — Telehealth: Payer: Self-pay | Admitting: Rheumatology

## 2019-08-24 NOTE — Telephone Encounter (Signed)
-----   Message from Shona Needles, RT sent at 08/23/2019  5:03 PM EST ----- Regarding: 5 MONTH F/U

## 2019-08-24 NOTE — Telephone Encounter (Signed)
LMOM for patient to call and schedule her 5 month follow-up appointment. 

## 2019-09-05 NOTE — Telephone Encounter (Signed)
LMOM for patient to call and schedule 5 month follow-up appointment. 

## 2019-09-07 ENCOUNTER — Ambulatory Visit: Payer: Medicare Other | Admitting: Psychology

## 2019-09-13 ENCOUNTER — Ambulatory Visit: Payer: Medicare Other | Admitting: Obstetrics

## 2019-09-18 ENCOUNTER — Telehealth: Payer: Self-pay | Admitting: Rheumatology

## 2019-09-18 NOTE — Telephone Encounter (Signed)
Patient called requesting to speak with you directly regarding her labwork.

## 2019-09-18 NOTE — Telephone Encounter (Signed)
Attempted to contact the patient and left message for patient to call the office.  

## 2019-09-28 ENCOUNTER — Ambulatory Visit: Payer: Medicare Other | Admitting: Obstetrics

## 2019-10-18 ENCOUNTER — Ambulatory Visit: Payer: Medicare Other | Admitting: Obstetrics

## 2019-10-31 ENCOUNTER — Ambulatory Visit: Payer: Medicare Other | Admitting: Psychology

## 2019-11-13 ENCOUNTER — Ambulatory Visit: Payer: Medicare Other | Admitting: Obstetrics

## 2019-11-21 ENCOUNTER — Other Ambulatory Visit: Payer: Self-pay

## 2019-11-21 DIAGNOSIS — Z79899 Other long term (current) drug therapy: Secondary | ICD-10-CM

## 2019-11-21 LAB — COMPLETE METABOLIC PANEL WITH GFR
AG Ratio: 1.3 (calc) (ref 1.0–2.5)
ALT: 8 U/L (ref 6–29)
AST: 15 U/L (ref 10–35)
Albumin: 4.2 g/dL (ref 3.6–5.1)
Alkaline phosphatase (APISO): 60 U/L (ref 37–153)
BUN: 15 mg/dL (ref 7–25)
CO2: 26 mmol/L (ref 20–32)
Calcium: 9.3 mg/dL (ref 8.6–10.4)
Chloride: 104 mmol/L (ref 98–110)
Creat: 0.81 mg/dL (ref 0.50–1.05)
GFR, Est African American: 95 mL/min/{1.73_m2} (ref >=60)
GFR, Est Non African American: 82 mL/min/{1.73_m2} (ref >=60)
Globulin: 3.2 g/dL (calc) (ref 1.9–3.7)
Glucose, Bld: 78 mg/dL (ref 65–99)
Potassium: 4 mmol/L (ref 3.5–5.3)
Sodium: 139 mmol/L (ref 135–146)
Total Bilirubin: 0.3 mg/dL (ref 0.2–1.2)
Total Protein: 7.4 g/dL (ref 6.1–8.1)

## 2019-11-21 LAB — CBC WITH DIFFERENTIAL/PLATELET
Absolute Monocytes: 502 cells/uL (ref 200–950)
Basophils Absolute: 23 cells/uL (ref 0–200)
Basophils Relative: 0.4 %
Eosinophils Absolute: 80 cells/uL (ref 15–500)
Eosinophils Relative: 1.4 %
HCT: 35.5 % (ref 35.0–45.0)
Hemoglobin: 11.7 g/dL (ref 11.7–15.5)
Lymphs Abs: 1659 cells/uL (ref 850–3900)
MCH: 30.5 pg (ref 27.0–33.0)
MCHC: 33 g/dL (ref 32.0–36.0)
MCV: 92.7 fL (ref 80.0–100.0)
MPV: 11.6 fL (ref 7.5–12.5)
Monocytes Relative: 8.8 %
Neutro Abs: 3437 cells/uL (ref 1500–7800)
Neutrophils Relative %: 60.3 %
Platelets: 303 10*3/uL (ref 140–400)
RBC: 3.83 10*6/uL (ref 3.80–5.10)
RDW: 12.3 % (ref 11.0–15.0)
Total Lymphocyte: 29.1 %
WBC: 5.7 10*3/uL (ref 3.8–10.8)

## 2019-11-22 NOTE — Progress Notes (Signed)
CBC and CMP are normal.

## 2019-11-27 ENCOUNTER — Ambulatory Visit (INDEPENDENT_AMBULATORY_CARE_PROVIDER_SITE_OTHER): Payer: Medicare Other | Admitting: Obstetrics

## 2019-11-27 ENCOUNTER — Other Ambulatory Visit (HOSPITAL_COMMUNITY)
Admission: RE | Admit: 2019-11-27 | Discharge: 2019-11-27 | Disposition: A | Discharge status: Home / Self Care | Payer: Medicare Other | Source: Ambulatory Visit | Attending: Obstetrics | Admitting: Obstetrics

## 2019-11-27 ENCOUNTER — Other Ambulatory Visit: Payer: Self-pay

## 2019-11-27 ENCOUNTER — Encounter: Payer: Self-pay | Admitting: Obstetrics

## 2019-11-27 VITALS — BP 163/93 | HR 90 | Wt 153.0 lb

## 2019-11-27 DIAGNOSIS — Z780 Asymptomatic menopausal state: Secondary | ICD-10-CM | POA: Diagnosis not present

## 2019-11-27 DIAGNOSIS — Z1151 Encounter for screening for human papillomavirus (HPV): Secondary | ICD-10-CM | POA: Diagnosis not present

## 2019-11-27 DIAGNOSIS — R140 Abdominal distension (gaseous): Secondary | ICD-10-CM | POA: Diagnosis not present

## 2019-11-27 DIAGNOSIS — Z1272 Encounter for screening for malignant neoplasm of vagina: Secondary | ICD-10-CM

## 2019-11-27 DIAGNOSIS — Z113 Encounter for screening for infections with a predominantly sexual mode of transmission: Secondary | ICD-10-CM | POA: Diagnosis not present

## 2019-11-27 DIAGNOSIS — N898 Other specified noninflammatory disorders of vagina: Secondary | ICD-10-CM | POA: Insufficient documentation

## 2019-11-27 DIAGNOSIS — Z01419 Encounter for gynecological examination (general) (routine) without abnormal findings: Secondary | ICD-10-CM | POA: Diagnosis not present

## 2019-11-27 DIAGNOSIS — Z803 Family history of malignant neoplasm of breast: Secondary | ICD-10-CM

## 2019-11-27 NOTE — Progress Notes (Signed)
Patient presents for Annual Exam today.   LS:LHTD   *pt wants a pregnancy test   LMP: No longer having cycles.  Last pap: 10/31/17 WNL  Mammogram: 06/06/2019 WNL  Colonoscopy : at age 54  STD Screening: Desires full panel

## 2019-11-27 NOTE — Progress Notes (Signed)
Subjective:        [name] is a 54 y.o. female here for a routine exam.  Current complaints: Abdominal fullness and bloating.  She says that she feels like there is a mass in there.    Personal health questionnaire:  Is patient Ashkenazi Jewish, have a family history of breast and/or ovarian cancer: yes Is there a family history of uterine cancer diagnosed at age < 73, gastrointestinal cancer, urinary tract cancer, family member who is a Field seismologist syndrome-associated carrier: no Is the patient overweight and hypertensive, family history of diabetes, personal history of gestational diabetes, preeclampsia or PCOS: no Is patient over 68, have PCOS,  family history of premature CHD under age 16, diabetes, smoke, have hypertension or peripheral artery disease:  no At any time, has a partner hit, kicked or otherwise hurt or frightened you?: no Over the past 2 weeks, have you felt down, depressed or hopeless?: no Over the past 2 weeks, have you felt little interest or pleasure in doing things?:no   Gynecologic History Patient's last menstrual period was 06/25/2016. Contraception: post menopausal status Last Pap: 2019. Results were: normal Last mammogram: 2020. Results were: normal  Obstetric History OB History  Gravida Para Term Preterm AB Living  2 1   1 1 1   SAB TAB Ectopic Multiple Live Births  1       1    # Outcome Date GA Lbr Len/2nd Weight Sex Delivery Anes PTL Lv  2 Preterm  [redacted]w[redacted]d       LIV  1 SAB  [redacted]w[redacted]d       DEC    Past Medical History:  Diagnosis Date  . Allergy   . Anxiety   . Arthritis    ra, lupus - ankles, knees, hands, neck  . Blood transfusion without reported diagnosis [place] with c/s surgery  . Bronchitis    Hx  . Cancer (HCC)    nasopharynx  . COPD (chronic obstructive pulmonary disease) ([place])   . DDD (degenerative disc disease), lumbar   . Diabetes mellitus without complication ([place])    related to prednisone use, no meds,cbg  normal  . Diverticulosis   . Excessive or frequent menstruation   . GERD (gastroesophageal reflux disease)   . Headache(784.0)   . Hypothyroidism   . Irregular menstrual cycle   . Leiomyoma of uterus, unspecified   . Lupus ([place])   . Migraines   . Neuromuscular disorder (HCC)    neuropathy in legs/feet  . Neuropathy   . Papanicolaou smear of cervix with atypical squamous cells of undetermined significance (ASC-US)   . Psoriasis   . Sinusitis   . Trichimoniasis   . Wears partial dentures    full upper and lower partial    Past Surgical History:  Procedure Laterality Date  . ANKLE ARTHROSCOPY Right   . CESAREAN SECTION     x 1  . DILATION AND CURETTAGE OF UTERUS     x 1 Missed Abortion  . ESOPHAGEAL DILATION    . LYMPH NODE BIOPSY     neck  . MULTIPLE EXTRACTIONS WITH ALVEOLOPLASTY N/A 03/05/2013   Procedure: MULTIPLE EXTRACION WITH ALVEOLOPLASTY, extraction of decayed teeth numbers 3,4,5,18,19,22,23,24,25, extraction of retained root tips teeth numbers 2,20,21,26, bilateral mandibular alveoloplasty and upper right maxillary alveoloplasty;  Surgeon: Isac Caddy, DDS;  Location: [facility];  Service: Oral Surgery;  Laterality: N/A;  . scalp biopsy    . TOOTH EXTRACTION Bilateral 03/05/2013  Procedure: EXTRACTION MOLARS;  Surgeon: Isac Caddy, DDS;  Location: [facility];  Service: Oral Surgery;  Laterality: Bilateral;     Current Outpatient Medications:  .  albuterol (PROVENTIL HFA;VENTOLIN HFA) 108 (90 BASE) MCG/ACT inhaler, Inhale 2 puffs into the lungs every 6 (six) hours as needed for wheezing or shortness of breath. , Disp: , Rfl:  .  albuterol (PROVENTIL) (2.5 MG/3ML) 0.083% nebulizer solution, Take 2.5 mg by nebulization every 6 (six) hours as needed for wheezing or shortness of breath. Reported on 10/27/2015, Disp: , Rfl:  .  Ascorbic Acid (VITAMIN C) 1000 MG tablet, Take 1,000 mg by mouth 2 (two) times daily., Disp: , Rfl:  .  calcium-vitamin D (OSCAL WITH D) 500-200  MG-UNIT per tablet, Take 1 tablet by mouth 2 (two) times daily., Disp: , Rfl:  .  cetirizine (ZYRTEC) 10 MG tablet, Take 10 mg by mouth daily., Disp: , Rfl:  .  COSENTYX 300 DOSE 150 MG/ML SOSY, Inject 300 mg into the muscle every 28 (twenty-eight) days. , Disp: , Rfl:  .  diclofenac (VOLTAREN) 75 MG EC tablet, Take 1 tablet by mouth 2 (two) times daily., Disp: , Rfl:  .  ferrous sulfate 325 (65 FE) MG tablet, Take 325 mg by mouth daily with breakfast., Disp: , Rfl:  .  furosemide (LASIX) 40 MG tablet, Take 40 mg by mouth daily as needed for fluid or edema. , Disp: , Rfl:  .  gabapentin (NEURONTIN) 100 MG capsule, Take 100 mg by mouth 3 (three) times daily. , Disp: , Rfl:  .  glucosamine-chondroitin 500-400 MG tablet, Take 1 tablet by mouth 2 (two) times daily., Disp: , Rfl:  .  hydrOXYzine (ATARAX/VISTARIL) 10 MG tablet, Take 10 mg by mouth at bedtime as needed for itching or anxiety. Reported on 10/27/2015, Disp: , Rfl:  .  levothyroxine (SYNTHROID, LEVOTHROID) 75 MCG tablet, Take 75 mcg by mouth daily., Disp: , Rfl:  .  metoprolol succinate (TOPROL-XL) 25 MG 24 hr tablet, Take 1 tablet (25 mg total) by mouth daily., Disp: 30 tablet, Rfl: 3 .  mometasone (NASONEX) 50 MCG/ACT nasal spray, Place 2 sprays into the nose daily. , Disp: , Rfl: 5 .  Multiple Vitamin (MULTIVITAMIN WITH MINERALS) TABS, Take 1 tablet by mouth daily., Disp: , Rfl:  .  pantoprazole (PROTONIX) 40 MG tablet, Take 40 mg by mouth daily. , Disp: , Rfl:  .  potassium chloride (K-DUR) 10 MEQ tablet, Take 1 tablet by mouth daily., Disp: , Rfl:  .  rizatriptan (MAXALT-MLT) 10 MG disintegrating tablet, Take 10 mg by mouth as needed for migraine. , Disp: , Rfl: 5 .  SYMBICORT 160-4.5 MCG/ACT inhaler, Inhale 2 puffs into the lungs 2 (two) times daily., Disp: , Rfl: 5 .  topiramate (TOPAMAX) 100 MG tablet, Take 100 mg by mouth at bedtime. , Disp: , Rfl:  .  VITAMIN E PO, Take 400 mg by mouth 2 (two) times daily. , Disp: , Rfl:  .   amLODipine (NORVASC) 5 MG tablet, Take 1 tablet (5 mg total) by mouth daily. (Patient not taking: Reported on 08/23/2019), Disp: , Rfl:  .  LORazepam (ATIVAN) 1 MG tablet, Take 1 mg by mouth at bedtime. , Disp: , Rfl:  .  nitroGLYCERIN (NITROSTAT) 0.4 MG SL tablet, Place 1 tablet (0.4 mg total) under the tongue every 5 (five) minutes as needed for chest pain., Disp: 25 tablet, Rfl: 3 .  risperiDONE (RISPERDAL) 0.5 MG tablet, Take 0.5 mg by mouth  at bedtime., Disp: , Rfl:  Allergies  Allergen Reactions  . Amoxicillin Itching    Did it involve swelling of the face/tongue/throat, SOB, or low BP? No Did it involve sudden or severe rash/hives, skin peeling, or any reaction on the inside of your mouth or nose? Yes Did you need to seek medical attention at a hospital or doctor's office? Yes When did it last happen?14 yrs ago If all above answers are "NO", may proceed with cephalosporin use.   . Latex Itching  . Percocet [Oxycodone-Acetaminophen] Itching  . Plaquenil [Hydroxychloroquine Sulfate] Other (See Comments)    "Psoriasis."  . Bactrim [Sulfamethoxazole-Trimethoprim] Rash  . Ciprofloxacin Hcl Rash  . Keflex [Cephalexin] Rash  . Penicillins Rash    Did it involve swelling of the face/tongue/throat, SOB, or low BP? No Did it involve sudden or severe rash/hives, skin peeling, or any reaction on the inside of your mouth or nose? Yes Did you need to seek medical attention at a hospital or doctor's office? Yes When did it last happen?14 yrs ago If all above answers are "NO", may proceed with cephalosporin use.   . Tetracyclines & Related Rash    Social History   Tobacco Use  . Smoking status: Former Smoker    Packs/day: 0.25    Years: 8.00    Pack years: 2.00    Types: Cigarettes    Quit date: 02/28/2004    Years since quitting: 15.7  . Smokeless tobacco: Never Used  Substance Use Topics  . Alcohol use: Not Currently    Alcohol/week: 0.0 standard drinks    Family History   Problem Relation Age of Onset  . Lupus Brother   . Breast cancer Maternal Grandmother 43  . Hypertension Mother   . Diabetes Mother   . Hypertension Father   . Colon cancer Neg Hx   . Esophageal cancer Neg Hx   . Rectal cancer Neg Hx   . Stomach cancer Neg Hx       Review of Systems  Constitutional: negative for fatigue and weight loss Respiratory: negative for cough and wheezing Cardiovascular: negative for chest pain, fatigue and palpitations Gastrointestinal: negative for abdominal pain and change in bowel habits Musculoskeletal:negative for myalgias Neurological: negative for gait problems and tremors Behavioral/Psych: negative for abusive relationship, depression Endocrine: negative for temperature intolerance    Genitourinary:negative for abnormal menstrual periods, genital lesions, hot flashes, sexual problems and vaginal discharge Integument/breast: negative for breast lump, breast tenderness, nipple discharge and skin lesion(s)    Objective:       BP (!) 163/93   Pulse 90   Wt 153 lb (69.4 kg)   LMP 06/25/2016   BMI 24.69 kg/m  General:   alert  Skin:   no rash or abnormalities  Lungs:   clear to auscultation bilaterally  Heart:   regular rate and rhythm, S1, S2 normal, no murmur, click, rub or gallop  Breasts:   normal without suspicious masses, skin or nipple changes or axillary nodes  Abdomen:  normal findings: no organomegaly, soft, non-tender and no hernia  Pelvis:  External genitalia: normal general appearance Urinary system: urethral meatus normal and bladder without fullness, nontender Vaginal: normal without tenderness, induration or masses Cervix: normal appearance Adnexa: normal bimanual exam Uterus: anteverted and non-tender, normal size   Lab Review Urine pregnancy test Labs reviewed yes Radiologic studies reviewed yes  50% of 25 min visit spent on counseling and coordination of care.   Assessment:     1. Encounter for  routine  gynecological examination with Papanicolaou smear of cervix Rx: - Cytology - PAP( [place])  2. Vaginal discharge Rx: - Cervicovaginal ancillary only( [place])  3. Screening for STD (sexually transmitted disease) Rx: - Hepatitis B surface antigen - Hepatitis C antibody - HIV Antibody (routine testing w rflx) - RPR  4. Bloating symptom Rx: - US PELVIC COMPLETE WITH TRANSVAGINAL; Future    Plan:    Education reviewed: calcium supplements, depression evaluation, low fat, low cholesterol diet, safe sex/STD prevention, self breast exams and weight bearing exercise. Follow up in: 1 year.    Orders Placed This Encounter  Procedures  . US PELVIC COMPLETE WITH TRANSVAGINAL    Standing Status:   Future    Standing Expiration Date:   11/26/2020    Order Specific Question:   Reason for Exam (SYMPTOM  OR DIAGNOSIS REQUIRED)    Answer:   Abdominal bloating and fullness    Order Specific Question:   Preferred imaging location?    Answer:   [facility]  . Hepatitis B surface antigen  . Hepatitis C antibody  . HIV Antibody (routine testing w rflx)  . RPR    Shelly Bombard, MD 11/27/2019 12:16 PM

## 2019-11-28 LAB — CERVICOVAGINAL ANCILLARY ONLY
Bacterial Vaginitis (gardnerella): POSITIVE — AB
Candida Glabrata: NEGATIVE
Candida Vaginitis: NEGATIVE
Chlamydia: NEGATIVE
Comment: NEGATIVE
Comment: NEGATIVE
Comment: NEGATIVE
Comment: NEGATIVE
Comment: NEGATIVE
Comment: NORMAL
Neisseria Gonorrhea: NEGATIVE
Trichomonas: NEGATIVE

## 2019-11-28 LAB — RPR: RPR Ser Ql: NONREACTIVE

## 2019-11-28 LAB — HEPATITIS C ANTIBODY: Hep C Virus Ab: <0.1 s/co ratio (ref 0.0–0.9)

## 2019-11-28 LAB — HIV ANTIBODY (ROUTINE TESTING W REFLEX): HIV Screen 4th Generation wRfx: NONREACTIVE

## 2019-11-28 LAB — HEPATITIS B SURFACE ANTIGEN: Hepatitis B Surface Ag: NEGATIVE

## 2019-11-29 ENCOUNTER — Other Ambulatory Visit: Payer: Self-pay | Admitting: Obstetrics

## 2019-11-29 DIAGNOSIS — B9689 Other specified bacterial agents as the cause of diseases classified elsewhere: Secondary | ICD-10-CM

## 2019-11-29 LAB — CYTOLOGY - PAP
Comment: NEGATIVE
Diagnosis: NEGATIVE
High risk HPV: NEGATIVE

## 2019-11-29 MED ORDER — TINIDAZOLE 500 MG PO TABS: 1000.0000 mg | ORAL_TABLET | Freq: Every day | ORAL | 2 refills | Status: DC

## 2019-12-05 ENCOUNTER — Other Ambulatory Visit: Payer: Self-pay

## 2019-12-05 ENCOUNTER — Ambulatory Visit
Admission: RE | Admit: 2019-12-05 | Discharge: 2019-12-05 | Disposition: A | Discharge status: Home / Self Care | Payer: Medicare Other | Source: Ambulatory Visit | Attending: Obstetrics | Admitting: Obstetrics

## 2019-12-05 DIAGNOSIS — R140 Abdominal distension (gaseous): Secondary | ICD-10-CM | POA: Diagnosis present

## 2020-01-22 ENCOUNTER — Other Ambulatory Visit: Payer: Self-pay

## 2020-01-22 MED ORDER — METOPROLOL SUCCINATE ER 25 MG PO TB24: 25.0000 mg | ORAL_TABLET | Freq: Every day | ORAL | 0 refills | Status: DC

## 2020-01-22 NOTE — Telephone Encounter (Signed)
Refill of Metoprolol 25 mg sent to [facility]

## 2020-01-25 NOTE — Progress Notes (Deleted)
Office Visit Note  Patient: [name]             Date of Birth: 1966/06/18           MRN: [id number]             PCP: Nolene Ebbs, MD Referring: Nolene Ebbs, MD Visit Date: 02/07/2020 Occupation: @GUAROCC @  Subjective:  No chief complaint on file.   History of Present Illness: [name] is a 54 y.o. female ***   Activities of Daily Living:  Patient reports morning stiffness for *** {minute/hour:19697}.   Patient {ACTIONS;DENIES/REPORTS:21021675::"Denies"} nocturnal pain.  Difficulty dressing/grooming: {ACTIONS;DENIES/REPORTS:21021675::"Denies"} Difficulty climbing stairs: {ACTIONS;DENIES/REPORTS:21021675::"Denies"} Difficulty getting out of chair: {ACTIONS;DENIES/REPORTS:21021675::"Denies"} Difficulty using hands for taps, buttons, cutlery, and/or writing: {ACTIONS;DENIES/REPORTS:21021675::"Denies"}  No Rheumatology ROS completed.   PMFS History:  Patient Active Problem List   Diagnosis Date Noted  . Chest pain in adult 02/28/2019  . Orthopnea 02/28/2019  . Shortness of breath 02/28/2019  . 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 History of asthma 01/10/2017  . Psoriatic arthritis ([place]) 08/10/2016  . High risk medication use 08/10/2016  . Chronic pain syndrome 08/22/2013  . Pulmonary infiltrates 06/30/2013  . Psoriasis   . GERD (gastroesophageal reflux disease)   . Hypothyroidism   . Pleurisy 06/19/2013  . Caries 03/04/2013  . Nephrolithiasis 04/05/2012  . Hydronephrosis 04/05/2012  . UTI (lower urinary tract infection) 04/05/2012  . On prednisone therapy 04/05/2012  . Positive ANA (antinuclear antibody) 04/05/2012    Past Medical History:  Diagnosis Date  . Allergy   . Anxiety   . Arthritis    ra, lupus - ankles, knees, hands, neck  . Blood transfusion without reported diagnosis [place] with c/s surgery  . Bronchitis    Hx  . Cancer (HCC)    nasopharynx  . COPD (chronic obstructive pulmonary disease) ([place])   .  DDD (degenerative disc disease), lumbar   . Diabetes mellitus without complication ([place])    related to prednisone use, no meds,cbg normal  . Diverticulosis   . Excessive or frequent menstruation   . GERD (gastroesophageal reflux disease)   . Headache(784.0)   . Hypothyroidism   . Irregular menstrual cycle   . Leiomyoma of uterus, unspecified   . Lupus ([place])   . Migraines   . Neuromuscular disorder (HCC)    neuropathy in legs/feet  . Neuropathy   . Papanicolaou smear of cervix with atypical squamous cells of undetermined significance (ASC-US)   . Psoriasis   . Sinusitis   . Trichimoniasis   . Wears partial dentures    full upper and lower partial    Family History  Problem Relation Age of Onset  . Lupus Brother   . Breast cancer Maternal Grandmother 66  . Hypertension Mother   . Diabetes Mother   . Hypertension Father   . Colon cancer Neg Hx   . Esophageal cancer Neg Hx   . Rectal cancer Neg Hx   . Stomach cancer Neg Hx    Past Surgical History:  Procedure Laterality Date  . ANKLE ARTHROSCOPY Right   . CESAREAN SECTION     x 1  . DILATION AND CURETTAGE OF UTERUS     x 1 Missed Abortion  . ESOPHAGEAL DILATION    . LYMPH NODE BIOPSY     neck  . MULTIPLE EXTRACTIONS WITH ALVEOLOPLASTY N/A 03/05/2013   Procedure: MULTIPLE EXTRACION WITH ALVEOLOPLASTY, extraction of decayed teeth numbers 3,4,5,18,19,22,23,24,25, extraction of retained root tips teeth  numbers 2,20,21,26, bilateral mandibular alveoloplasty and upper right maxillary alveoloplasty;  Surgeon: Isac Caddy, DDS;  Location: [facility];  Service: Oral Surgery;  Laterality: N/A;  . scalp biopsy    . TOOTH EXTRACTION Bilateral 03/05/2013   Procedure: EXTRACTION MOLARS;  Surgeon: Isac Caddy, DDS;  Location: [facility];  Service: Oral Surgery;  Laterality: Bilateral;   Social History   Social History Narrative  . Not on file   Immunization History  Administered Date(s) Administered  . Influenza Split  02/27/2013, 03/21/2014  . PPD Test 06/25/2015, 04/29/2016, 01/12/2017     Objective: Vital Signs: LMP 06/25/2016    Physical Exam   Musculoskeletal Exam: ***  CDAI Exam: CDAI Score: -- Patient Global: --; Provider Global: -- Swollen: --; Tender: -- Joint Exam 02/07/2020   No joint exam has been documented for this visit   There is currently no information documented on the homunculus. Go to the Rheumatology activity and complete the homunculus joint exam.  Investigation: No additional findings.  Imaging: No results found.  Recent Labs: Lab Results  Component Value Date   WBC 5.7 11/21/2019   HGB 11.7 11/21/2019   PLT 303 11/21/2019   NA 139 11/21/2019   K 4.0 11/21/2019   CL 104 11/21/2019   CO2 26 11/21/2019   GLUCOSE 78 11/21/2019   BUN 15 11/21/2019   CREATININE 0.81 11/21/2019   BILITOT 0.3 11/21/2019   ALKPHOS 63 01/17/2019   AST 15 11/21/2019   ALT 8 11/21/2019   PROT 7.4 11/21/2019   ALBUMIN 4.0 01/17/2019   CALCIUM 9.3 11/21/2019   GFRAA 95 11/21/2019   QFTBGOLDPLUS NEGATIVE 10/21/2017    Speciality Comments: No specialty comments available.  Procedures:  No procedures performed Allergies: Amoxicillin, Latex, Percocet [oxycodone-acetaminophen], Plaquenil [hydroxychloroquine sulfate], Bactrim [sulfamethoxazole-trimethoprim], Ciprofloxacin hcl, Keflex [cephalexin], Penicillins, and Tetracyclines & related   Assessment / Plan:     Visit Diagnoses: No diagnosis found.  Orders: No orders of the defined types were placed in this encounter.  No orders of the defined types were placed in this encounter.   Face-to-face time spent with patient was *** minutes. Greater than 50% of time was spent in counseling and coordination of care.  Follow-Up Instructions: No follow-ups on file.   Earnestine Mealing, CMA  Note - This record has been created using Editor, commissioning.  Chart creation errors have been sought, but may not always  have been located.  Such creation errors do not reflect on  the standard of medical care.

## 2020-02-07 ENCOUNTER — Ambulatory Visit: Payer: Medicare Other | Admitting: Rheumatology

## 2020-02-11 ENCOUNTER — Ambulatory Visit: Payer: Medicare Other | Admitting: Obstetrics

## 2020-02-15 ENCOUNTER — Ambulatory Visit: Payer: Medicare Other | Admitting: Obstetrics

## 2020-02-19 ENCOUNTER — Ambulatory Visit: Payer: Medicare Other | Admitting: Obstetrics

## 2020-02-28 ENCOUNTER — Ambulatory Visit: Payer: Medicare Other | Admitting: Obstetrics

## 2020-03-04 ENCOUNTER — Other Ambulatory Visit: Payer: Self-pay | Admitting: Cardiology

## 2020-03-04 MED ORDER — METOPROLOL SUCCINATE ER 25 MG PO TB24: 25.0000 mg | ORAL_TABLET | Freq: Every day | ORAL | 0 refills | Status: DC

## 2020-03-04 NOTE — Telephone Encounter (Signed)
Refill sent in per request.  

## 2020-03-04 NOTE — Telephone Encounter (Signed)
*  STAT* If patient is at the pharmacy, call can be transferred to refill team.   1. Which medications need to be refilled? (please list name of each medication and dose if known) metoprolol succinate (TOPROL-XL) 25 MG 24 hr tablet  2. Which pharmacy/location (including street and city if local pharmacy) is medication to be sent to? [id number] [place] [place] - [address] AT [place]  3. Do they need a 30 day or 90 day supply? [place]

## 2020-03-06 ENCOUNTER — Ambulatory Visit: Payer: Medicare Other | Admitting: Rheumatology

## 2020-03-07 NOTE — Progress Notes (Deleted)
Office Visit Note  Patient: [name]             Date of Birth: 1966/05/30           MRN: [id number]             PCP: Nolene Ebbs, MD Referring: Nolene Ebbs, MD Visit Date: 03/21/2020 Occupation: @GUAROCC @  Subjective:  No chief complaint on file.   History of Present Illness: [name] is a 54 y.o. female ***   Activities of Daily Living:  Patient reports morning stiffness for *** {minute/hour:19697}.   Patient {ACTIONS;DENIES/REPORTS:21021675::"Denies"} nocturnal pain.  Difficulty dressing/grooming: {ACTIONS;DENIES/REPORTS:21021675::"Denies"} Difficulty climbing stairs: {ACTIONS;DENIES/REPORTS:21021675::"Denies"} Difficulty getting out of chair: {ACTIONS;DENIES/REPORTS:21021675::"Denies"} Difficulty using hands for taps, buttons, cutlery, and/or writing: {ACTIONS;DENIES/REPORTS:21021675::"Denies"}  No Rheumatology ROS completed.   PMFS History:  Patient Active Problem List   Diagnosis Date Noted  . Chest pain in adult 02/28/2019  . Orthopnea 02/28/2019  . Shortness of breath 02/28/2019  . 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 History of asthma 01/10/2017  . Psoriatic arthritis ([place]) 08/10/2016  . High risk medication use 08/10/2016  . Chronic pain syndrome 08/22/2013  . Pulmonary infiltrates 06/30/2013  . Psoriasis   . GERD (gastroesophageal reflux disease)   . Hypothyroidism   . Pleurisy 06/19/2013  . Caries 03/04/2013  . Nephrolithiasis 04/05/2012  . Hydronephrosis 04/05/2012  . UTI (lower urinary tract infection) 04/05/2012  . On prednisone therapy 04/05/2012  . Positive ANA (antinuclear antibody) 04/05/2012    Past Medical History:  Diagnosis Date  . Allergy   . Anxiety   . Arthritis    ra, lupus - ankles, knees, hands, neck  . Blood transfusion without reported diagnosis [place] with c/s surgery  . Bronchitis    Hx  . Cancer (HCC)    nasopharynx  . COPD (chronic obstructive pulmonary disease) ([place])   .  DDD (degenerative disc disease), lumbar   . Diabetes mellitus without complication ([place])    related to prednisone use, no meds,cbg normal  . Diverticulosis   . Excessive or frequent menstruation   . GERD (gastroesophageal reflux disease)   . Headache(784.0)   . Hypothyroidism   . Irregular menstrual cycle   . Leiomyoma of uterus, unspecified   . Lupus ([place])   . Migraines   . Neuromuscular disorder (HCC)    neuropathy in legs/feet  . Neuropathy   . Papanicolaou smear of cervix with atypical squamous cells of undetermined significance (ASC-US)   . Psoriasis   . Sinusitis   . Trichimoniasis   . Wears partial dentures    full upper and lower partial    Family History  Problem Relation Age of Onset  . Lupus Brother   . Breast cancer Maternal Grandmother 70  . Hypertension Mother   . Diabetes Mother   . Hypertension Father   . Colon cancer Neg Hx   . Esophageal cancer Neg Hx   . Rectal cancer Neg Hx   . Stomach cancer Neg Hx    Past Surgical History:  Procedure Laterality Date  . ANKLE ARTHROSCOPY Right   . CESAREAN SECTION     x 1  . DILATION AND CURETTAGE OF UTERUS     x 1 Missed Abortion  . ESOPHAGEAL DILATION    . LYMPH NODE BIOPSY     neck  . MULTIPLE EXTRACTIONS WITH ALVEOLOPLASTY N/A 03/05/2013   Procedure: MULTIPLE EXTRACION WITH ALVEOLOPLASTY, extraction of decayed teeth numbers 3,4,5,18,19,22,23,24,25, extraction of retained root tips teeth  numbers 2,20,21,26, bilateral mandibular alveoloplasty and upper right maxillary alveoloplasty;  Surgeon: Isac Caddy, DDS;  Location: [facility];  Service: Oral Surgery;  Laterality: N/A;  . scalp biopsy    . TOOTH EXTRACTION Bilateral 03/05/2013   Procedure: EXTRACTION MOLARS;  Surgeon: Isac Caddy, DDS;  Location: [facility];  Service: Oral Surgery;  Laterality: Bilateral;   Social History   Social History Narrative  . Not on file   Immunization History  Administered Date(s) Administered  . Influenza Split  02/27/2013, 03/21/2014  . PPD Test 06/25/2015, 04/29/2016, 01/12/2017     Objective: Vital Signs: LMP 06/25/2016    Physical Exam   Musculoskeletal Exam: ***  CDAI Exam: CDAI Score: -- Patient Global: --; Provider Global: -- Swollen: --; Tender: -- Joint Exam 03/21/2020   No joint exam has been documented for this visit   There is currently no information documented on the homunculus. Go to the Rheumatology activity and complete the homunculus joint exam.  Investigation: No additional findings.  Imaging: No results found.  Recent Labs: Lab Results  Component Value Date   WBC 5.7 11/21/2019   HGB 11.7 11/21/2019   PLT 303 11/21/2019   NA 139 11/21/2019   K 4.0 11/21/2019   CL 104 11/21/2019   CO2 26 11/21/2019   GLUCOSE 78 11/21/2019   BUN 15 11/21/2019   CREATININE 0.81 11/21/2019   BILITOT 0.3 11/21/2019   ALKPHOS 63 01/17/2019   AST 15 11/21/2019   ALT 8 11/21/2019   PROT 7.4 11/21/2019   ALBUMIN 4.0 01/17/2019   CALCIUM 9.3 11/21/2019   GFRAA 95 11/21/2019   QFTBGOLDPLUS NEGATIVE 10/21/2017    Speciality Comments: No specialty comments available.  Procedures:  No procedures performed Allergies: Amoxicillin, Latex, Percocet [oxycodone-acetaminophen], Plaquenil [hydroxychloroquine sulfate], Bactrim [sulfamethoxazole-trimethoprim], Ciprofloxacin hcl, Keflex [cephalexin], Penicillins, and Tetracyclines & related   Assessment / Plan:     Visit Diagnoses: No diagnosis found.  Orders: No orders of the defined types were placed in this encounter.  No orders of the defined types were placed in this encounter.   Face-to-face time spent with patient was *** minutes. Greater than 50% of time was spent in counseling and coordination of care.  Follow-Up Instructions: No follow-ups on file.   Ofilia Neas, PA-C  Note - This record has been created using Dragon software.  Chart creation errors have been sought, but may not always  have been located. Such  creation errors do not reflect on  the standard of medical care.

## 2020-03-10 ENCOUNTER — Telehealth: Payer: Self-pay | Admitting: Cardiology

## 2020-03-10 NOTE — Telephone Encounter (Signed)
I think if I am not can I see her back in the office from this point forward to do her best to get prescriptions with her primary care physician.

## 2020-03-10 NOTE — Telephone Encounter (Signed)
° ° °  Pt c/o medication issue:  1. Name of Medication:   metoprolol succinate (TOPROL-XL) 25 MG 24 hr tablet    2. How are you currently taking this medication (dosage and times per day)? Take 1 tablet (25 mg total) by mouth daily.  3. Are you having a reaction (difficulty breathing--STAT)?   4. What is your medication issue? Pt said when she saw Dr. Bettina Gavia last 05/2019 she said Dr. Bettina Gavia discharged her and no need a f/u, she wanted to know if her heart is strong and no problem why she needs to continue take metoprolol and if Dr. Bettina Gavia wants for her to continue it why he only gave her 1 refill.

## 2020-03-10 NOTE — Telephone Encounter (Signed)
Pt called and was very upset asking why we called in a refill on her metoprolol for her and only gave her one refill.....she asking why she was told that she did not need to return to the office but we sent in for a refill.. she is asking if this a long term med or when can she stop it.. I explained to her that it is mostly based on her symptoms and if she feels better taking it but she should follow up with her PCP.   The pt raised her voice and she was escalating on the phone and repeated "no" to me several times and says she thinks that something is wrong with our office and says she will only speak with Dr. Bettina Gavia and she never wants anyone to call her back unless MD is after their name.   I explained to her that he is in clinic but I can make her a follow up appointment with him but she yelled back at me that she is not coming back but wants a call from Dr. Bettina Gavia explaining everything to her.    I apologized that I was unable to help her any further and I will forward her message to Dr. Bettina Gavia.   Last OV 06/08/19

## 2020-03-19 ENCOUNTER — Ambulatory Visit: Payer: Medicare Other | Admitting: Obstetrics

## 2020-03-21 ENCOUNTER — Ambulatory Visit: Payer: Medicare Other | Admitting: Rheumatology

## 2020-03-21 ENCOUNTER — Encounter: Payer: Self-pay | Admitting: *Deleted

## 2020-03-26 ENCOUNTER — Ambulatory Visit: Payer: Medicare Other | Admitting: Obstetrics

## 2020-05-30 ENCOUNTER — Other Ambulatory Visit: Payer: Self-pay | Admitting: Cardiology

## 2020-05-30 ENCOUNTER — Telehealth: Payer: Self-pay | Admitting: Rheumatology

## 2020-05-30 NOTE — Telephone Encounter (Signed)
Patient advised she last had a TB Gold in May 2019. Patient states she thought the test was performed with her January 2021 labs. Patient advised the only tests that were done are a CBC and CMP. Patient expressed understanding.

## 2020-05-30 NOTE — Telephone Encounter (Signed)
Patient left a message requesting confirmation of having a TB test done this year. Also, if so patient would like a copy of results. Please call to advise.

## 2020-05-30 NOTE — Telephone Encounter (Signed)
Rx refill sent to pharmacy. 

## 2020-06-09 ENCOUNTER — Telehealth: Payer: Self-pay | Admitting: *Deleted

## 2020-06-09 NOTE — Telephone Encounter (Signed)
Pt called to office stating she had some questions. Return call to pt.  Pt had questions about last medication she was prescribed and what it was for.  Advised pt of medication and discussed BV and causes/treatment.

## 2020-06-12 ENCOUNTER — Telehealth: Payer: Self-pay | Admitting: *Deleted

## 2020-06-12 NOTE — Telephone Encounter (Signed)
Left message to advise patient her appointment for 06/27/2020 has been cancelled as she has been dismissed from our practice on March 21, 2020.

## 2020-06-27 ENCOUNTER — Ambulatory Visit: Payer: Medicare Other | Admitting: Rheumatology

## 2020-07-24 ENCOUNTER — Other Ambulatory Visit: Payer: Self-pay | Admitting: Internal Medicine

## 2020-07-24 DIAGNOSIS — Z1231 Encounter for screening mammogram for malignant neoplasm of breast: Secondary | ICD-10-CM

## 2020-09-10 ENCOUNTER — Ambulatory Visit: Payer: Medicare Other

## 2020-11-19 ENCOUNTER — Other Ambulatory Visit: Payer: Self-pay | Admitting: Internal Medicine

## 2020-11-20 LAB — TSH: TSH: 3.68 mIU/L

## 2020-11-20 LAB — COMPLETE METABOLIC PANEL WITH GFR
AG Ratio: 1 (calc) (ref 1.0–2.5)
ALT: 10 U/L (ref 6–29)
AST: 18 U/L (ref 10–35)
Albumin: 3.8 g/dL (ref 3.6–5.1)
Alkaline phosphatase (APISO): 80 U/L (ref 37–153)
BUN: 13 mg/dL (ref 7–25)
CO2: 22 mmol/L (ref 20–32)
Calcium: 9.4 mg/dL (ref 8.6–10.4)
Chloride: 106 mmol/L (ref 98–110)
Creat: 0.73 mg/dL (ref 0.50–1.05)
GFR, Est African American: 107 mL/min/{1.73_m2} (ref >=60)
GFR, Est Non African American: 93 mL/min/{1.73_m2} (ref >=60)
Globulin: 3.9 g/dL (calc) — ABNORMAL HIGH (ref 1.9–3.7)
Glucose, Bld: 98 mg/dL (ref 65–99)
Potassium: 3.9 mmol/L (ref 3.5–5.3)
Sodium: 143 mmol/L (ref 135–146)
Total Bilirubin: 0.3 mg/dL (ref 0.2–1.2)
Total Protein: 7.7 g/dL (ref 6.1–8.1)

## 2020-11-20 LAB — LIPID PANEL
Cholesterol: 176 mg/dL (ref <200)
HDL: 44 mg/dL — ABNORMAL LOW (ref >=50)
LDL Cholesterol (Calc): 105 mg/dL (calc) — ABNORMAL HIGH
Non-HDL Cholesterol (Calc): 132 mg/dL (calc) — ABNORMAL HIGH (ref <130)
Total CHOL/HDL Ratio: 4 (calc) (ref <5.0)
Triglycerides: 154 mg/dL — ABNORMAL HIGH (ref <150)

## 2020-11-20 LAB — CBC
HCT: 30.1 % — ABNORMAL LOW (ref 35.0–45.0)
Hemoglobin: 9 g/dL — ABNORMAL LOW (ref 11.7–15.5)
MCH: 25.7 pg — ABNORMAL LOW (ref 27.0–33.0)
MCHC: 29.9 g/dL — ABNORMAL LOW (ref 32.0–36.0)
MCV: 86 fL (ref 80.0–100.0)
MPV: 9.9 fL (ref 7.5–12.5)
Platelets: 570 10*3/uL — ABNORMAL HIGH (ref 140–400)
RBC: 3.5 10*6/uL — ABNORMAL LOW (ref 3.80–5.10)
RDW: 14.3 % (ref 11.0–15.0)
WBC: 9.3 10*3/uL (ref 3.8–10.8)

## 2020-11-20 LAB — VITAMIN D 25 HYDROXY (VIT D DEFICIENCY, FRACTURES): Vit D, 25-Hydroxy: 51 ng/mL (ref 30–100)

## 2020-11-20 LAB — HIV ANTIBODY (ROUTINE TESTING W REFLEX): HIV 1&2 Ab, 4th Generation: NONREACTIVE

## 2020-11-20 LAB — T4, FREE: Free T4: 1.2 ng/dL (ref 0.8–1.8)

## 2020-12-09 ENCOUNTER — Telehealth: Payer: Self-pay | Admitting: Cardiology

## 2020-12-09 MED ORDER — METOPROLOL SUCCINATE ER 25 MG PO TB24: ORAL_TABLET | ORAL | 3 refills | Status: DC

## 2020-12-09 NOTE — Telephone Encounter (Signed)
Refill sent in per request.  

## 2020-12-09 NOTE — Telephone Encounter (Signed)
*  STAT* If patient is at the pharmacy, call can be transferred to refill team.   1. Which medications need to be refilled? (please list name of each medication and dose if known) new prescription for Metoprolol  2. Which pharmacy/location (including street and city if local pharmacy) is medication to be sent to?*[facility] [place]  3. Do they need a 30 day or 90 day supply? 90 days and refills

## 2020-12-31 ENCOUNTER — Other Ambulatory Visit (INDEPENDENT_AMBULATORY_CARE_PROVIDER_SITE_OTHER): Payer: Medicare Other

## 2020-12-31 ENCOUNTER — Encounter: Payer: Self-pay | Admitting: Physician Assistant

## 2020-12-31 ENCOUNTER — Ambulatory Visit (INDEPENDENT_AMBULATORY_CARE_PROVIDER_SITE_OTHER): Payer: Medicare Other | Admitting: Physician Assistant

## 2020-12-31 VITALS — BP 102/74 | HR 95 | Ht 66.0 in | Wt 158.0 lb

## 2020-12-31 DIAGNOSIS — R1319 Other dysphagia: Secondary | ICD-10-CM | POA: Diagnosis not present

## 2020-12-31 DIAGNOSIS — D509 Iron deficiency anemia, unspecified: Secondary | ICD-10-CM

## 2020-12-31 DIAGNOSIS — R634 Abnormal weight loss: Secondary | ICD-10-CM

## 2020-12-31 LAB — IBC + FERRITIN
Ferritin: 272 ng/mL (ref 10.0–291.0)
Iron: 103 ug/dL (ref 42–145)
Saturation Ratios: 35.5 % (ref 20.0–50.0)
Transferrin: 207 mg/dL — ABNORMAL LOW (ref 212.0–360.0)

## 2020-12-31 LAB — B12 AND FOLATE PANEL
Folate: >24.4 ng/mL (ref >5.9)
Vitamin B-12: 685 pg/mL (ref 211–911)

## 2020-12-31 MED ORDER — NA SULFATE-K SULFATE-MG SULF 17.5-3.13-1.6 GM/177ML PO SOLN: 1.0000 | Freq: Once | ORAL | 0 refills | Status: AC

## 2020-12-31 NOTE — Progress Notes (Signed)
Chief Complaint: Weight loss and anemia  HPI:    [name] is a 55 year old African-American female with a past medical history of COPD, reflux and multiple others listed below, known to Dr. Hilarie Fredrickson, who was referred to me by Nolene Ebbs, MD for a complaint of anemia and weight loss.    06/2016 EGD with a small hiatal hernia and no other abnormality.  Empirically dilated for complaints of dysphagia.    10/2015 colonoscopy with multiple sigmoid diverticuli and a 3 mm polyp found to be benign polypoid colonic mucosa.    08/16/2018 patient seen in clinic by Nicoletta Ba for weight loss and lack of appetite.  Also discussed some right abdominal pain as well as epigastric.  At that time plan for repeat CBC, c-Met and TSH and schedule patient for CT scan of the abdomen pelvis with contrast as well as a chest x-ray PA and lateral.  Continued on Protonix 40 mg daily.    08/24/2018 CT of the abdomen pelvis with contrast with no acute findings.    11/20/2020 CMP normal, CBC with a hemoglobin of 9, MCV normal at 86.  TSH normal.    11/26/2020 patient seen by her PCP for follow-up of hypertension.  At that time discussed her anemia and she was referred to [place].    Today, the patient tells me that she had a flare with her lupus about 3 to 4 months ago and it just "brought me down".  Explains that around that time that she had labs that showed her hemoglobin of 9 as above.  Her iron supplementation was changed to "fusion plus", she was not eating around that time and lost some weight, but tells me that everything is going back in the right direction and she is feeling better and able to eat more.  Does discuss some dysphagia which she has off and on and seemed worse during this flare of lupus.  Describes that at times she even chokes on water.    Denies fever, chills, blood in her stool or symptoms that awaken her from sleep.     Past Medical History:  Diagnosis Date   Allergy    Anxiety    Arthritis    ra, lupus  - ankles, knees, hands, neck   Blood transfusion without reported diagnosis [facility] with c/s surgery   Bronchitis    Hx   Cancer (HCC)    nasopharynx   COPD (chronic obstructive pulmonary disease) (HCC)    DDD (degenerative disc disease), lumbar    Diabetes mellitus without complication ([place])    related to prednisone use, no meds,cbg normal   Diverticulosis    Excessive or frequent menstruation    GERD (gastroesophageal reflux disease)    Headache(784.0)    Hypothyroidism    Irregular menstrual cycle    Leiomyoma of uterus, unspecified    Lupus (HCC)    Migraines    Neuromuscular disorder (HCC)    neuropathy in legs/feet   Neuropathy    Papanicolaou smear of cervix with atypical squamous cells of undetermined significance (ASC-US)    Psoriasis    Sinusitis    Trichimoniasis    Wears partial dentures    full upper and lower partial    Past Surgical History:  Procedure Laterality Date   ANKLE ARTHROSCOPY Right    CESAREAN SECTION     x 1   DILATION AND CURETTAGE OF UTERUS     x 1 Missed Abortion   ESOPHAGEAL DILATION  LYMPH NODE BIOPSY     neck   MULTIPLE EXTRACTIONS WITH ALVEOLOPLASTY N/A 03/05/2013   Procedure: MULTIPLE EXTRACION WITH ALVEOLOPLASTY, extraction of decayed teeth numbers 3,4,5,18,19,22,23,24,25, extraction of retained root tips teeth numbers 2,20,21,26, bilateral mandibular alveoloplasty and upper right maxillary alveoloplasty;  Surgeon: Isac Caddy, DDS;  Location: [facility];  Service: Oral Surgery;  Laterality: N/A;   scalp biopsy     TOOTH EXTRACTION Bilateral 03/05/2013   Procedure: EXTRACTION MOLARS;  Surgeon: Isac Caddy, DDS;  Location: [facility];  Service: Oral Surgery;  Laterality: Bilateral;    Current Outpatient Medications  Medication Sig Dispense Refill   albuterol (PROVENTIL HFA;VENTOLIN HFA) 108 (90 BASE) MCG/ACT inhaler Inhale 2 puffs into the lungs every 6 (six) hours as needed for wheezing or shortness of breath.       albuterol (PROVENTIL) (2.5 MG/3ML) 0.083% nebulizer solution Take 2.5 mg by nebulization every 6 (six) hours as needed for wheezing or shortness of breath. Reported on 10/27/2015     Ascorbic Acid (VITAMIN C) 1000 MG tablet Take 1,000 mg by mouth 2 (two) times daily.     calcium-vitamin D (OSCAL WITH D) 500-200 MG-UNIT per tablet Take 1 tablet by mouth 2 (two) times daily.     cetirizine (ZYRTEC) 10 MG tablet Take 10 mg by mouth daily.     COSENTYX 300 DOSE 150 MG/ML SOSY Inject 300 mg into the muscle every 28 (twenty-eight) days.      diclofenac (VOLTAREN) 75 MG EC tablet Take 1 tablet by mouth 2 (two) times daily.     furosemide (LASIX) 40 MG tablet Take 40 mg by mouth daily as needed for fluid or edema.      gabapentin (NEURONTIN) 100 MG capsule Take 100 mg by mouth 3 (three) times daily.      glucosamine-chondroitin 500-400 MG tablet Take 1 tablet by mouth 2 (two) times daily.     hydrOXYzine (ATARAX/VISTARIL) 10 MG tablet Take 10 mg by mouth at bedtime as needed for itching or anxiety. Reported on 10/27/2015     levothyroxine (SYNTHROID, LEVOTHROID) 75 MCG tablet Take 75 mcg by mouth daily.     LORazepam (ATIVAN) 1 MG tablet Take 1 mg by mouth at bedtime.      metoprolol succinate (TOPROL-XL) 25 MG 24 hr tablet TAKE 1 TABLET(25 MG) BY MOUTH DAILY 90 tablet 3   mometasone (NASONEX) 50 MCG/ACT nasal spray Place 2 sprays into the nose daily.   5   Multiple Vitamin (MULTIVITAMIN WITH MINERALS) TABS Take 1 tablet by mouth daily.     nitroGLYCERIN (NITROSTAT) 0.4 MG SL tablet Place 1 tablet (0.4 mg total) under the tongue every 5 (five) minutes as needed for chest pain. 25 tablet 3   pantoprazole (PROTONIX) 40 MG tablet Take 40 mg by mouth daily.      potassium chloride (K-DUR) 10 MEQ tablet Take 1 tablet by mouth daily.     risperiDONE (RISPERDAL) 0.5 MG tablet Take 0.5 mg by mouth at bedtime.     rizatriptan (MAXALT-MLT) 10 MG disintegrating tablet Take 10 mg by mouth as needed for migraine.   5    SYMBICORT 160-4.5 MCG/ACT inhaler Inhale 2 puffs into the lungs 2 (two) times daily.  5   tinidazole (TINDAMAX) 500 MG tablet Take 2 tablets (1,000 mg total) by mouth daily with breakfast. 10 tablet 2   topiramate (TOPAMAX) 100 MG tablet Take 100 mg by mouth at bedtime.      VITAMIN E PO Take 400 mg  by mouth 2 (two) times daily.      No current facility-administered medications for this visit.    Allergies as of 12/31/2020 - Review Complete 12/31/2020  Allergen Reaction Noted   Amoxicillin Itching 04/04/2012   Latex Itching 02/27/2013   Percocet [oxycodone-acetaminophen] Itching 05/01/2013   Plaquenil [hydroxychloroquine sulfate] Other (See Comments) 09/22/2012   Bactrim [sulfamethoxazole-trimethoprim] Rash 04/04/2012   Ciprofloxacin hcl Rash 04/04/2012   Keflex [cephalexin] Rash 04/04/2012   Penicillins Rash 04/04/2012   Tetracyclines & related Rash 04/04/2012    Family History  Problem Relation Age of Onset   Lupus Brother    Breast cancer Maternal Grandmother 10   Hypertension Mother    Diabetes Mother    Hypertension Father    Colon cancer Neg Hx    Esophageal cancer Neg Hx    Rectal cancer Neg Hx    Stomach cancer Neg Hx     Social History   Socioeconomic History   Marital status: Divorced    Spouse name: Not on file   Number of children: Not on file   Years of education: Not on file   Highest education level: Not on file  Occupational History   Not on file  Tobacco Use   Smoking status: Former    Packs/day: 0.25    Years: 8.00    Pack years: 2.00    Types: Cigarettes    Quit date: 02/28/2004    Years since quitting: 16.8   Smokeless tobacco: Never  Vaping Use   Vaping Use: Never used  Substance and Sexual Activity   Alcohol use: Not Currently    Alcohol/week: 0.0 standard drinks   Drug use: No   Sexual activity: Yes    Partners: Male    Birth control/protection: OCP, Pill  Other Topics Concern   Not on file  Social History Narrative   Not on  file   Social Determinants of Health   Financial Resource Strain: Not on file  Food Insecurity: Not on file  Transportation Needs: Not on file  Physical Activity: Not on file  Stress: Not on file  Social Connections: Not on file  Intimate Partner Violence: Not on file    Review of Systems:    Constitutional: No fever or chills Cardiovascular: No chest pain Respiratory: No SOB Gastrointestinal: See HPI and otherwise negative   Physical Exam:  Vital signs: BP 102/74   Pulse 95   Ht _0  (1.676 m)   Wt 158 lb (71.7 kg)   LMP 06/25/2016   BMI 25.50 kg/m   Constitutional:   Pleasant AA female appears to be in NAD, Well developed, Well nourished, alert and cooperative  Respiratory: Respirations even and unlabored. Lungs clear to auscultation bilaterally.   No wheezes, crackles, or rhonchi.  Cardiovascular: Normal S1, S2. No MRG. Regular rate and rhythm. No peripheral edema, cyanosis or pallor.  Gastrointestinal:  Soft, nondistended, nontender. No rebound or guarding. Normal bowel sounds. No appreciable masses or hepatomegaly. Rectal:  Not performed.  Psychiatric: Oriented to person, place and time. Demonstrates good judgement and reason without abnormal affect or behaviors.  RELEVANT LABS AND IMAGING: CBC    Component Value Date/Time   WBC 9.3 11/19/2020 0000   RBC 3.50 (L) 11/19/2020 0000   HGB 9.0 (L) 11/19/2020 0000   HGB 12.4 10/14/2016 1151   HGB 10.6 (L) 03/25/2009 1245   HCT 30.1 (L) 11/19/2020 0000   HCT 38.7 10/14/2016 1151   HCT 30.9 (L) 03/25/2009 1245   PLT 570 (  H) 11/19/2020 0000   PLT 331 10/14/2016 1151   MCV 86.0 11/19/2020 0000   MCV 93 10/14/2016 1151   MCV 93.1 03/25/2009 1245   MCH 25.7 (L) 11/19/2020 0000   MCHC 29.9 (L) 11/19/2020 0000   RDW 14.3 11/19/2020 0000   RDW 14.0 10/14/2016 1151   RDW 15.8 (H) 03/25/2009 1245   LYMPHSABS 1,659 11/21/2019 1450   LYMPHSABS 2.0 10/14/2016 1151   LYMPHSABS 1.6 03/25/2009 1245   MONOABS 0.1 02/06/2019  2235   MONOABS 0.5 03/25/2009 1245   EOSABS 80 11/21/2019 1450   EOSABS 0.1 10/14/2016 1151   BASOSABS 23 11/21/2019 1450   BASOSABS 0.0 10/14/2016 1151   BASOSABS 0.0 03/25/2009 1245    CMP     Component Value Date/Time   NA 143 11/19/2020 0000   NA 142 10/14/2016 1151   K 3.9 11/19/2020 0000   CL 106 11/19/2020 0000   CO2 22 11/19/2020 0000   GLUCOSE 98 11/19/2020 0000   BUN 13 11/19/2020 0000   BUN 9 10/14/2016 1151   CREATININE 0.73 11/19/2020 0000   CALCIUM 9.4 11/19/2020 0000   PROT 7.7 11/19/2020 0000   PROT 7.2 10/14/2016 1151   ALBUMIN 4.0 01/17/2019 1700   ALBUMIN 3.9 10/14/2016 1151   AST 18 11/19/2020 0000   ALT 10 11/19/2020 0000   ALKPHOS 63 01/17/2019 1700   BILITOT 0.3 11/19/2020 0000   BILITOT 0.3 10/14/2016 1151   GFRNONAA 93 11/19/2020 0000   GFRAA 107 11/19/2020 0000    Assessment: 1.  Anemia: Last hemoglobin was 9, see HPI, this was just after a "lupus flare" per patient during which she was not eating, last EGD in 2018, last colonoscopy in 2017; consider relation to lupus versus GI source versus other 2.  Dysphagia: Difficulty swallowing off-and-on, seemed worse with lupus flare as above; consider stricture versus dysmotility versus other 3.  Weight loss: Discusses some weight loss, 15 pounds or so over a period of 3 months when she had a lupus flare, now gaining weight again; likely related to decreased caloric intake  Plan: 1.  Scheduled patient for an EGD and colonoscopy with Dr. Hilarie Fredrickson in the [facility].  She does request Dr. Hilarie Fredrickson only and was given an appointment at the end of August.  Did provide the patient with a detailed list of risks for the procedures and she agrees to proceed. 2.  Encouraged the patient continue her iron supplementation. 3.  Recheck CBC, iron studies and B12/folate today 4.  Patient to follow in clinic per recommendations after time of procedures.  Ellouise Newer, PA-C [facility] 12/31/2020, 3:04 PM  Cc:  Nolene Ebbs, MD

## 2020-12-31 NOTE — Patient Instructions (Signed)
Your provider has requested that you go to the basement level for lab work before leaving today. Press "B" on the elevator. The lab is located at the first door on the left as you exit the elevator.  You have been scheduled for an endoscopy and colonoscopy. Please follow the written instructions given to you at your visit today. Please pick up your prep supplies at the pharmacy within the next 1-3 days. If you use inhalers (even only as needed), please bring them with you on the day of your procedure.   If you are age 85 or older, your body mass index should be between 23-30. Your Body mass index is 25.5 kg/m. If this is out of the aforementioned range listed, please consider follow up with your Primary Care Provider.  If you are age 84 or younger, your body mass index should be between 19-25. Your Body mass index is 25.5 kg/m. If this is out of the aformentioned range listed, please consider follow up with your Primary Care Provider.   __________________________________________________________  The [facility] providers would like to encourage you to use [facility] to communicate with providers for non-urgent requests or questions.  Due to long hold times on the telephone, sending your provider a message by [facility] may be a faster and more efficient way to get a response.  Please allow 48 business hours for a response.  Please remember that this is for non-urgent requests.

## 2021-01-01 LAB — CBC WITH DIFFERENTIAL/PLATELET
Basophils Absolute: 0.1 10*3/uL (ref 0.0–0.1)
Basophils Relative: 0.8 % (ref 0.0–3.0)
Eosinophils Absolute: 0.1 10*3/uL (ref 0.0–0.7)
Eosinophils Relative: 0.7 % (ref 0.0–5.0)
HCT: 34.4 % — ABNORMAL LOW (ref 36.0–46.0)
Hemoglobin: 11.2 g/dL — ABNORMAL LOW (ref 12.0–15.0)
Lymphocytes Relative: 18.2 % (ref 12.0–46.0)
Lymphs Abs: 1.4 10*3/uL (ref 0.7–4.0)
MCHC: 32.5 g/dL (ref 30.0–36.0)
MCV: 89.4 fl (ref 78.0–100.0)
Monocytes Absolute: 0.4 10*3/uL (ref 0.1–1.0)
Monocytes Relative: 5.3 % (ref 3.0–12.0)
Neutro Abs: 5.6 10*3/uL (ref 1.4–7.7)
Neutrophils Relative %: 75 % (ref 43.0–77.0)
Platelets: 290 10*3/uL (ref 150.0–400.0)
RBC: 3.85 Mil/uL — ABNORMAL LOW (ref 3.87–5.11)
RDW: 20.3 % — ABNORMAL HIGH (ref 11.5–15.5)
WBC: 7.5 10*3/uL (ref 4.0–10.5)

## 2021-01-16 NOTE — Progress Notes (Signed)
Addendum: Reviewed and agree with assessment and management plan. Lannah Koike M, MD  

## 2021-02-04 ENCOUNTER — Encounter: Payer: Self-pay | Admitting: Internal Medicine

## 2021-02-04 ENCOUNTER — Ambulatory Visit (AMBULATORY_SURGERY_CENTER): Payer: Medicare Other | Admitting: Internal Medicine

## 2021-02-04 ENCOUNTER — Other Ambulatory Visit: Payer: Self-pay

## 2021-02-04 VITALS — BP 164/96 | HR 82 | Temp 98.2°F | Resp 17 | Ht 66.0 in | Wt 158.0 lb

## 2021-02-04 DIAGNOSIS — K317 Polyp of stomach and duodenum: Secondary | ICD-10-CM | POA: Diagnosis not present

## 2021-02-04 DIAGNOSIS — R634 Abnormal weight loss: Secondary | ICD-10-CM

## 2021-02-04 DIAGNOSIS — R1310 Dysphagia, unspecified: Secondary | ICD-10-CM

## 2021-02-04 DIAGNOSIS — D509 Iron deficiency anemia, unspecified: Secondary | ICD-10-CM

## 2021-02-04 DIAGNOSIS — D649 Anemia, unspecified: Secondary | ICD-10-CM | POA: Diagnosis not present

## 2021-02-04 DIAGNOSIS — K319 Disease of stomach and duodenum, unspecified: Secondary | ICD-10-CM | POA: Diagnosis not present

## 2021-02-04 DIAGNOSIS — K5730 Diverticulosis of large intestine without perforation or abscess without bleeding: Secondary | ICD-10-CM

## 2021-02-04 HISTORY — PX: COLONOSCOPY WITH ESOPHAGOGASTRODUODENOSCOPY (EGD): SHX5779

## 2021-02-04 MED ORDER — SODIUM CHLORIDE 0.9 % IV SOLN: 500.0000 mL | Freq: Once | INTRAVENOUS | Status: DC

## 2021-02-04 NOTE — Progress Notes (Signed)
A and O x3. Report to RN. Tolerated MAC anesthesia well.Gums unchanged after procedure. 

## 2021-02-04 NOTE — Op Note (Signed)
[facility] Patient Name: [name] Procedure Date: 02/04/2021 7:28 AM MRN: [id number] Endoscopist: Jerene Bears , MD Age: 55 Referring MD:  Date of Birth: 1965/10/03 Gender: Female Account #: [id number] Procedure:                Colonoscopy Indications:              Unexplained anemia, Weight loss, last colonoscopy                            May 2017 Medicines:                Monitored Anesthesia Care Procedure:                Pre-Anesthesia Assessment:                           - Prior to the procedure, a History and Physical                            was performed, and patient medications and                            allergies were reviewed. The patient's tolerance of                            previous anesthesia was also reviewed. The risks                            and benefits of the procedure and the sedation                            options and risks were discussed with the patient.                            All questions were answered, and informed consent                            was obtained. Prior Anticoagulants: The patient has                            taken no previous anticoagulant or antiplatelet                            agents. ASA Grade Assessment: III - A patient with                            severe systemic disease. After reviewing the risks                            and benefits, the patient was deemed in                            satisfactory condition to undergo the procedure.  After obtaining informed consent, the colonoscope                            was passed under direct vision. Throughout the                            procedure, the patient's blood pressure, pulse, and                            oxygen saturations were monitored continuously. The                            PCF-HQ190L Colonoscope was introduced through the                            anus and advanced to the cecum, identified by                             appendiceal orifice and ileocecal valve. The                            colonoscopy was performed without difficulty. The                            patient tolerated the procedure well. The quality                            of the bowel preparation was excellent. The                            ileocecal valve, appendiceal orifice, and rectum                            were photographed. Scope In: 8:23:02 AM Scope Out: 8:35:19 AM Scope Withdrawal Time: 0 hours 7 minutes 12 seconds  Total Procedure Duration: 0 hours 12 minutes 17 seconds  Findings:                 The digital rectal exam was normal.                           Multiple small and large-mouthed diverticula were                            found in the sigmoid colon.                           The exam was otherwise without abnormality on                            direct and retroflexion views. Complications:            No immediate complications. Estimated Blood Loss:     Estimated blood loss: none. Impression:               - Diverticulosis in the sigmoid colon.                           -  The examination was otherwise normal on direct                            and retroflexion views.                           - No specimens collected. Recommendation:           - Patient has a contact number available for                            emergencies. The signs and symptoms of potential                            delayed complications were discussed with the                            patient. Return to normal activities tomorrow.                            Written discharge instructions were provided to the                            patient.                           - Resume previous diet.                           - Continue present medications.                           - Repeat colonoscopy in 10 years for screening                            purposes. Jerene Bears, MD 02/04/2021 8:43:52 AM This  report has been signed electronically.

## 2021-02-04 NOTE — Progress Notes (Signed)
Called to room to assist during endoscopic procedure.  Patient ID and intended procedure confirmed with present staff. Received instructions for my participation in the procedure from the performing physician.  

## 2021-02-04 NOTE — Progress Notes (Signed)
GASTROENTEROLOGY PROCEDURE H&P NOTE   Primary Care Physician: Nolene Ebbs, MD    Reason for Procedure:  Dysphagia, weight loss, anemia  Plan:    Upper endoscopy with probable dilation and colonoscopy  Patient is appropriate for endoscopic procedure(s) in the ambulatory ([facility]) setting.  The nature of the procedure, as well as the risks, benefits, and alternatives were carefully and thoroughly reviewed with the patient. Ample time for discussion and questions allowed. The patient understood, was satisfied, and agreed to proceed.     HPI: [name] is a 55 y.o. female who presents for upper endoscopy and colonoscopy.  Seen in the clinic just over 4 weeks ago by Ellouise Newer, PA-C.  No significant changes in medical history and no new complaints today.  No chest pain or shortness of breath.  Tolerated the prep.  Past Medical History:  Diagnosis Date   Allergy    Anxiety    Arthritis    ra, lupus - ankles, knees, hands, neck   Blood transfusion without reported diagnosis [place] with c/s surgery   Bronchitis    Hx   Cancer (HCC)    nasopharynx   COPD (chronic obstructive pulmonary disease) (HCC)    DDD (degenerative disc disease), lumbar    Diabetes mellitus without complication ([facility])    related to prednisone use, no meds,cbg normal   Diverticulosis    Excessive or frequent menstruation    GERD (gastroesophageal reflux disease)    Headache(784.0)    Hypertension    Hypothyroidism    Irregular menstrual cycle    Leiomyoma of uterus, unspecified    Lupus (HCC)    Migraines    Neuromuscular disorder (HCC)    neuropathy in legs/feet   Neuropathy    Papanicolaou smear of cervix with atypical squamous cells of undetermined significance (ASC-US)    Psoriasis    Sinusitis    Trichimoniasis    Wears partial dentures    full upper and lower partial    Past Surgical History:  Procedure Laterality Date   ANKLE ARTHROSCOPY Right    CESAREAN  SECTION     x 1   DILATION AND CURETTAGE OF UTERUS     x 1 Missed Abortion   ESOPHAGEAL DILATION     LYMPH NODE BIOPSY     neck   MULTIPLE EXTRACTIONS WITH ALVEOLOPLASTY N/A 03/05/2013   Procedure: MULTIPLE EXTRACION WITH ALVEOLOPLASTY, extraction of decayed teeth numbers 3,4,5,18,19,22,23,24,25, extraction of retained root tips teeth numbers 2,20,21,26, bilateral mandibular alveoloplasty and upper right maxillary alveoloplasty;  Surgeon: Isac Caddy, DDS;  Location: [facility];  Service: Oral Surgery;  Laterality: N/A;   scalp biopsy     TOOTH EXTRACTION Bilateral 03/05/2013   Procedure: EXTRACTION MOLARS;  Surgeon: Isac Caddy, DDS;  Location: [facility];  Service: Oral Surgery;  Laterality: Bilateral;    Prior to Admission medications   Medication Sig Start Date End Date Taking? Authorizing Provider  Ascorbic Acid (VITAMIN C) 1000 MG tablet Take 1,000 mg by mouth 2 (two) times daily.   Yes [provider]  calcium-vitamin D (OSCAL WITH D) 500-200 MG-UNIT per tablet Take 1 tablet by mouth 2 (two) times daily.   Yes [provider]  cetirizine (ZYRTEC) 10 MG tablet Take 10 mg by mouth daily.   Yes [provider]  gabapentin (NEURONTIN) 100 MG capsule Take 100 mg by mouth 3 (three) times daily.    Yes [provider]  glucosamine-chondroitin 500-400 MG tablet Take  1 tablet by mouth 2 (two) times daily.   Yes [provider]  hydrOXYzine (ATARAX/VISTARIL) 10 MG tablet Take 10 mg by mouth at bedtime as needed for itching or anxiety. Reported on 10/27/2015   Yes [provider]  Iron-FA-B Cmp-C-Biot-Probiotic (FUSION PLUS) CAPS Take 1 capsule by mouth daily. 12/12/20  Yes [provider]  levothyroxine (SYNTHROID, LEVOTHROID) 75 MCG tablet Take 75 mcg by mouth daily.   Yes [provider]  metoprolol succinate (TOPROL-XL) 25 MG 24 hr tablet TAKE 1 TABLET(25 MG) BY MOUTH DAILY 12/09/20  Yes Richardo Priest, MD  mometasone  (NASONEX) 50 MCG/ACT nasal spray Place 2 sprays into the nose daily.  07/18/16  Yes [provider]  Multiple Vitamin (MULTIVITAMIN WITH MINERALS) TABS Take 1 tablet by mouth daily.   Yes [provider]  pantoprazole (PROTONIX) 40 MG tablet Take 40 mg by mouth daily.    Yes [provider]  potassium chloride (K-DUR) 10 MEQ tablet Take 1 tablet by mouth daily. 02/20/19  Yes [provider]  SYMBICORT 160-4.5 MCG/ACT inhaler Inhale 2 puffs into the lungs 2 (two) times daily. 05/02/14  Yes [provider]  topiramate (TOPAMAX) 100 MG tablet Take 100 mg by mouth at bedtime.    Yes [provider]  VITAMIN E PO Take 400 mg by mouth 2 (two) times daily.    Yes [provider]  albuterol (PROVENTIL HFA;VENTOLIN HFA) 108 (90 BASE) MCG/ACT inhaler Inhale 2 puffs into the lungs every 6 (six) hours as needed for wheezing or shortness of breath.     [provider]  albuterol (PROVENTIL) (2.5 MG/3ML) 0.083% nebulizer solution Take 2.5 mg by nebulization every 6 (six) hours as needed for wheezing or shortness of breath. Reported on 10/27/2015    [provider]  COSENTYX 300 DOSE 150 MG/ML SOSY Inject 300 mg into the muscle every 28 (twenty-eight) days.  10/27/16   [provider]  diclofenac (VOLTAREN) 75 MG EC tablet Take 1 tablet by mouth 2 (two) times daily. Patient not taking: Reported on 02/04/2021 02/15/19   [provider]  furosemide (LASIX) 40 MG tablet Take 40 mg by mouth daily as needed for fluid or edema.  06/18/16   [provider]  nitroGLYCERIN (NITROSTAT) 0.4 MG SL tablet Place 1 tablet (0.4 mg total) under the tongue every 5 (five) minutes as needed for chest pain. 02/28/19 08/23/19  Richardo Priest, MD  risperiDONE (RISPERDAL) 0.5 MG tablet Take 0.5 mg by mouth at bedtime. 03/12/19   [provider]  rizatriptan (MAXALT-MLT) 10 MG disintegrating tablet Take 10 mg by mouth as needed for  migraine.  03/14/18   [provider]  triamcinolone cream (KENALOG) 0.1 % Apply topically 2 (two) times daily as needed. 11/19/20   [provider]    Current Outpatient Medications  Medication Sig Dispense Refill   Ascorbic Acid (VITAMIN C) 1000 MG tablet Take 1,000 mg by mouth 2 (two) times daily.     calcium-vitamin D (OSCAL WITH D) 500-200 MG-UNIT per tablet Take 1 tablet by mouth 2 (two) times daily.     cetirizine (ZYRTEC) 10 MG tablet Take 10 mg by mouth daily.     gabapentin (NEURONTIN) 100 MG capsule Take 100 mg by mouth 3 (three) times daily.      glucosamine-chondroitin 500-400 MG tablet Take 1 tablet by mouth 2 (two) times daily.     hydrOXYzine (ATARAX/VISTARIL) 10 MG tablet Take 10 mg by mouth at  bedtime as needed for itching or anxiety. Reported on 10/27/2015     Iron-FA-B Cmp-C-Biot-Probiotic (FUSION PLUS) CAPS Take 1 capsule by mouth daily.     levothyroxine (SYNTHROID, LEVOTHROID) 75 MCG tablet Take 75 mcg by mouth daily.     metoprolol succinate (TOPROL-XL) 25 MG 24 hr tablet TAKE 1 TABLET(25 MG) BY MOUTH DAILY 90 tablet 3   mometasone (NASONEX) 50 MCG/ACT nasal spray Place 2 sprays into the nose daily.   5   Multiple Vitamin (MULTIVITAMIN WITH MINERALS) TABS Take 1 tablet by mouth daily.     pantoprazole (PROTONIX) 40 MG tablet Take 40 mg by mouth daily.      potassium chloride (K-DUR) 10 MEQ tablet Take 1 tablet by mouth daily.     SYMBICORT 160-4.5 MCG/ACT inhaler Inhale 2 puffs into the lungs 2 (two) times daily.  5   topiramate (TOPAMAX) 100 MG tablet Take 100 mg by mouth at bedtime.      VITAMIN E PO Take 400 mg by mouth 2 (two) times daily.      albuterol (PROVENTIL HFA;VENTOLIN HFA) 108 (90 BASE) MCG/ACT inhaler Inhale 2 puffs into the lungs every 6 (six) hours as needed for wheezing or shortness of breath.      albuterol (PROVENTIL) (2.5 MG/3ML) 0.083% nebulizer solution Take 2.5 mg by nebulization every 6 (six) hours as needed for wheezing or  shortness of breath. Reported on 10/27/2015     COSENTYX 300 DOSE 150 MG/ML SOSY Inject 300 mg into the muscle every 28 (twenty-eight) days.      diclofenac (VOLTAREN) 75 MG EC tablet Take 1 tablet by mouth 2 (two) times daily. (Patient not taking: Reported on 02/04/2021)     furosemide (LASIX) 40 MG tablet Take 40 mg by mouth daily as needed for fluid or edema.      nitroGLYCERIN (NITROSTAT) 0.4 MG SL tablet Place 1 tablet (0.4 mg total) under the tongue every 5 (five) minutes as needed for chest pain. 25 tablet 3   risperiDONE (RISPERDAL) 0.5 MG tablet Take 0.5 mg by mouth at bedtime.     rizatriptan (MAXALT-MLT) 10 MG disintegrating tablet Take 10 mg by mouth as needed for migraine.   5   triamcinolone cream (KENALOG) 0.1 % Apply topically 2 (two) times daily as needed.     Current Facility-Administered Medications  Medication Dose Route Frequency Provider Last Rate Last Admin   0.9 %  sodium chloride infusion  500 mL Intravenous Once Makylie Rivere, Lajuan Lines, MD        Allergies as of 02/04/2021 - Review Complete 02/04/2021  Allergen Reaction Noted   Amoxicillin Itching 04/04/2012   Latex Itching 02/27/2013   Percocet [oxycodone-acetaminophen] Itching 05/01/2013   Plaquenil [hydroxychloroquine sulfate] Other (See Comments) 09/22/2012   Bactrim [sulfamethoxazole-trimethoprim] Rash 04/04/2012   Ciprofloxacin hcl Rash 04/04/2012   Keflex [cephalexin] Rash 04/04/2012   Penicillins Rash 04/04/2012   Tetracyclines & related Rash 04/04/2012    Family History  Problem Relation Age of Onset   Hypertension Mother    Diabetes Mother    Hypertension Father    Lupus Brother    Breast cancer Maternal Grandmother 76   Colon cancer Neg Hx    Esophageal cancer Neg Hx    Rectal cancer Neg Hx    Stomach cancer Neg Hx    Colon polyps Neg Hx     Social History   Socioeconomic History   Marital status: Divorced    Spouse name: Not on file   Number of  children: Not on file   Years of education: Not on  file   Highest education level: Not on file  Occupational History   Not on file  Tobacco Use   Smoking status: Former    Packs/day: 0.25    Years: 8.00    Pack years: 2.00    Types: Cigarettes    Quit date: 02/28/2004    Years since quitting: 16.9   Smokeless tobacco: Never  Vaping Use   Vaping Use: Never used  Substance and Sexual Activity   Alcohol use: Not Currently    Alcohol/week: 0.0 standard drinks   Drug use: No   Sexual activity: Yes    Partners: Male    Birth control/protection: OCP, Pill  Other Topics Concern   Not on file  Social History Narrative   Not on file   Social Determinants of Health   Financial Resource Strain: Not on file  Food Insecurity: Not on file  Transportation Needs: Not on file  Physical Activity: Not on file  Stress: Not on file  Social Connections: Not on file  Intimate Partner Violence: Not on file    Physical Exam: Vital signs in last 24 hours: '@BP'$  (!) 155/88   Pulse 88   Temp 98.2 F (36.8 C)   Resp (!) 9   Ht '5\' 6"'$  (1.676 m)   Wt 158 lb (71.7 kg)   LMP 06/25/2016   SpO2 100%   BMI 25.50 kg/m  GEN: NAD EYE: Sclerae anicteric ENT: MMM CV: Non-tachycardic Pulm: CTA b/l GI: Soft, NT/ND NEURO:  Alert & Oriented x 3   Zenovia Jarred, MD [facility]  02/04/2021 8:12 AM

## 2021-02-04 NOTE — Op Note (Signed)
[facility] Patient Name: [name] Procedure Date: 02/04/2021 8:03 AM MRN: [id number] Endoscopist: Jerene Bears , MD Age: 55 Referring MD:  Date of Birth: 1966/01/08 Gender: Female Account #: [id number] Procedure:                Upper GI endoscopy Indications:              Dysphagia, Weight loss, anemia Medicines:                Monitored Anesthesia Care Procedure:                Pre-Anesthesia Assessment:                           - Prior to the procedure, a History and Physical                            was performed, and patient medications and                            allergies were reviewed. The patient's tolerance of                            previous anesthesia was also reviewed. The risks                            and benefits of the procedure and the sedation                            options and risks were discussed with the patient.                            All questions were answered, and informed consent                            was obtained. Prior Anticoagulants: The patient has                            taken no previous anticoagulant or antiplatelet                            agents. ASA Grade Assessment: III - A patient with                            severe systemic disease. After reviewing the risks                            and benefits, the patient was deemed in                            satisfactory condition to undergo the procedure.                           After obtaining informed consent, the endoscope was  passed under direct vision. Throughout the                            procedure, the patient's blood pressure, pulse, and                            oxygen saturations were monitored continuously. The                            GIF [id number] was introduced through the                            mouth, and advanced to the second part of duodenum.                            The upper GI endoscopy  was accomplished without                            difficulty. The patient tolerated the procedure                            well. Scope In: Scope Out: Findings:                 Normal mucosa was found in the entire esophagus.                           A 1 cm hiatal hernia was present.                           No endoscopic abnormality was evident in the                            esophagus to explain the patient's complaint of                            dysphagia. It was decided, however, to proceed with                            dilation of the entire esophagus. The scope was                            withdrawn. Dilation was performed with a Maloney                            dilator with mild resistance at 52 Fr.                           A few small sessile polyps with no bleeding and no                            stigmata of recent bleeding were found in the  gastric fundus and in the gastric body. Sampling                            biopsies were taken with a cold forceps for                            histology.                           The exam of the stomach was otherwise normal.                           Biopsies were taken with a cold forceps in the                            gastric body, at the incisura and in the gastric                            antrum for histology and Helicobacter pylori                            testing.                           The examined duodenum was normal. Complications:            No immediate complications. Estimated Blood Loss:     Estimated blood loss was minimal. Impression:               - Normal mucosa was found in the entire esophagus.                           - 1 cm hiatal hernia.                           - No endoscopic esophageal abnormality to explain                            patient's dysphagia. Esophagus dilated with 52 Fr                            Maloney.                           - A few  gastric polyps. Sampled by biopsy.                           - Biopsies from stomach were taken with a cold                            forceps for histology and Helicobacter pylori                            testing.                           -  Normal examined duodenum. Recommendation:           - Patient has a contact number available for                            emergencies. The signs and symptoms of potential                            delayed complications were discussed with the                            patient. Return to normal activities tomorrow.                            Written discharge instructions were provided to the                            patient.                           - Resume previous diet after post-dilation protocol.                           - Continue present medications.                           - Await pathology results. Jerene Bears, MD 02/04/2021 8:41:56 AM This report has been signed electronically.

## 2021-02-04 NOTE — Patient Instructions (Signed)
Handouts given for Post-dilation diet, Hiatal Hernia and Diverticulosis.  Await pathology results.  Follow the post-dilation diet today and resume a regular diet tomorrow.    YOU HAD AN ENDOSCOPIC PROCEDURE TODAY AT [facility]:   Refer to the procedure report that was given to you for any specific questions about what was found during the examination.  If the procedure report does not answer your questions, please call your gastroenterologist to clarify.  If you requested that your care partner not be given the details of your procedure findings, then the procedure report has been included in a sealed envelope for you to review at your convenience later.  YOU SHOULD EXPECT: Some feelings of bloating in the abdomen. Passage of more gas than usual.  Walking can help get rid of the air that was put into your GI tract during the procedure and reduce the bloating. If you had a lower endoscopy (such as a colonoscopy or flexible sigmoidoscopy) you may notice spotting of blood in your stool or on the toilet paper. If you underwent a bowel prep for your procedure, you may not have a normal bowel movement for a few days.  Please Note:  You might notice some irritation and congestion in your nose or some drainage.  This is from the oxygen used during your procedure.  There is no need for concern and it should clear up in a day or so.  SYMPTOMS TO REPORT IMMEDIATELY:  Following lower endoscopy (colonoscopy or flexible sigmoidoscopy):  Excessive amounts of blood in the stool  Significant tenderness or worsening of abdominal pains  Swelling of the abdomen that is new, acute  Fever of 100F or higher  Following upper endoscopy (EGD)  Vomiting of blood or coffee ground material  New chest pain or pain under the shoulder blades  Painful or persistently difficult swallowing  New shortness of breath  Fever of 100F or higher  Black, tarry-looking stools  For urgent or emergent issues, a  gastroenterologist can be reached at any hour by calling [phone]. Do not use MyChart messaging for urgent concerns.    DIET: Post-dilation diet  ACTIVITY:  You should plan to take it easy for the rest of today and you should NOT DRIVE or use heavy machinery until tomorrow (because of the sedation medicines used during the test).    FOLLOW UP: Our staff will call the number listed on your records 48-72 hours following your procedure to check on you and address any questions or concerns that you may have regarding the information given to you following your procedure. If we do not reach you, we will leave a message.  We will attempt to reach you two times.  During this call, we will ask if you have developed any symptoms of COVID 19. If you develop any symptoms (ie: fever, flu-like symptoms, shortness of breath, cough etc.) before then, please call [phone].  If you test positive for Covid 19 in the 2 weeks post procedure, please call and report this information to [place].    If any biopsies were taken you will be contacted by phone or by letter within the next 1-3 weeks.  Please call us at [phone] if you have not heard about the biopsies in 3 weeks.    SIGNATURES/CONFIDENTIALITY: You and/or your care partner have signed paperwork which will be entered into your electronic medical record.  These signatures attest to the fact that that the information above on your After Visit Summary  has been reviewed and is understood.  Full responsibility of the confidentiality of this discharge information lies with you and/or your care-partner.  

## 2021-02-04 NOTE — Progress Notes (Signed)
CHECK-IN-AER  V/S-Star

## 2021-02-06 ENCOUNTER — Telehealth: Payer: Self-pay

## 2021-02-06 ENCOUNTER — Telehealth: Payer: Self-pay | Admitting: *Deleted

## 2021-02-06 NOTE — Telephone Encounter (Signed)
Left message on follow up call. 

## 2021-02-06 NOTE — Telephone Encounter (Signed)
  Follow up Call-  Call back number 02/04/2021  Post procedure Call Back phone  # [phone]  Permission to leave phone message Yes  Some recent data might be hidden    LMOM to call back with any questions or concerns.  Also, call back if patient has developed fever, respiratory issues or been dx with COVID or had any family members or close contacts diagnosed since her procedure.

## 2021-02-10 ENCOUNTER — Encounter: Payer: Self-pay | Admitting: Internal Medicine

## 2021-02-20 ENCOUNTER — Telehealth: Payer: Self-pay | Admitting: Internal Medicine

## 2021-02-20 NOTE — Telephone Encounter (Signed)
Inbound call from patient is requesting a call back regarding results.

## 2021-02-24 NOTE — Telephone Encounter (Signed)
Pt was notified of results

## 2021-02-25 ENCOUNTER — Other Ambulatory Visit (HOSPITAL_COMMUNITY): Payer: Self-pay | Admitting: Internal Medicine

## 2021-02-25 DIAGNOSIS — I872 Venous insufficiency (chronic) (peripheral): Secondary | ICD-10-CM

## 2021-02-26 ENCOUNTER — Inpatient Hospital Stay (HOSPITAL_COMMUNITY): Admission: RE | Admit: 2021-02-26 | Payer: Medicare Other | Source: Ambulatory Visit

## 2021-03-05 ENCOUNTER — Ambulatory Visit: Payer: Medicare Other

## 2021-03-05 ENCOUNTER — Inpatient Hospital Stay (HOSPITAL_COMMUNITY): Admission: RE | Admit: 2021-03-05 | Payer: Medicare Other | Source: Ambulatory Visit

## 2021-03-10 ENCOUNTER — Telehealth: Payer: Self-pay | Admitting: Cardiology

## 2021-03-10 ENCOUNTER — Encounter (HOSPITAL_COMMUNITY): Payer: Self-pay | Admitting: Cardiology

## 2021-03-10 MED ORDER — NITROGLYCERIN 0.4 MG SL SUBL: 0.4000 mg | SUBLINGUAL_TABLET | SUBLINGUAL | 3 refills | Status: AC | PRN

## 2021-03-10 NOTE — Telephone Encounter (Signed)
*  STAT* If patient is at the pharmacy, call can be transferred to refill team.   1. Which medications need to be refilled? (please list name of each medication and dose if known) nitroGLYCERIN (NITROSTAT) 0.4 MG SL tablet (Expired)  2. Which pharmacy/location (including street and city if local pharmacy) is medication to be sent to? [facility] [phone] - [place], [place] - [address] AT [place]  3. Do they need a 30 day or 90 day supply? 30    Patient has not been seen since 2020

## 2021-03-10 NOTE — Telephone Encounter (Signed)
Refill sent in per request.  

## 2021-04-01 ENCOUNTER — Ambulatory Visit: Payer: Medicare Other | Admitting: Obstetrics

## 2021-04-02 ENCOUNTER — Encounter (HOSPITAL_COMMUNITY): Payer: Medicare Other

## 2021-04-08 ENCOUNTER — Encounter (HOSPITAL_COMMUNITY): Payer: Medicare Other

## 2021-04-30 ENCOUNTER — Other Ambulatory Visit: Payer: Self-pay | Admitting: Internal Medicine

## 2021-04-30 ENCOUNTER — Other Ambulatory Visit: Payer: Self-pay

## 2021-05-02 LAB — URINE CULTURE
MICRO NUMBER:: 12622870
Result:: NO GROWTH
SPECIMEN QUALITY:: ADEQUATE

## 2021-05-04 ENCOUNTER — Ambulatory Visit: Payer: Medicare Other | Admitting: Nurse Practitioner

## 2021-05-04 NOTE — Progress Notes (Deleted)
05/04/2021 [name] [id number] Oct 21, 1965   Chief Complaint:  History of Present Illness: [name]. [name] is a 55 year old female with a past medical history of hypertension, palpitations, COPD, hypothyroidism, + ANA, psoriatic arthritis, ???? Lupus, IDA, GERD and dysphagia. She is followed by Dr. Hilarie Fredrickson.   She presents to our office today for further evaluation regarding   EGD 02/04/2021: - Normal mucosa was found in the entire esophagus. - 1 cm hiatal hernia. - No endoscopic esophageal abnormality to explain patient's dysphagia. Esophagus dilated with 52 Fr Maloney. - A few gastric polyps. Sampled by biopsy. - Biopsies from stomach were taken with a cold forceps for histology and Helicobacter pylori testing. - Normal examined duodenum. 1. Surgical [P], gastric antrum and gastric body - BENIGN GASTRIC MUCOSA WITH MILD REACTIVE CHANGES - NO H. PYLORI, INTESTINAL METAPLASIA OR MALIGNANCY IDENTIFIED 2. Surgical [P], gastric polyps - FUNDIC GLAND POLYP(S) - NO H. PYLORI, INTESTINAL METAPLASIA OR MALIGNANCY IDENTIFIED  Colonoscopy 02/04/2021: - Diverticulosis in the sigmoid colon. - The examination was otherwise normal on direct and retroflexion views. - No specimens collected. - Recall colonoscopy 10 years   Colonoscopy 11/10/2015: - The examined portion of the ileum was normal. - One 3 mm polyp in the cecum, removed with a cold biopsy forceps. Resected and retrieved. - Diverticulosis in the sigmoid colon. - The examination was otherwise normal on direct and retroflexion views. - BENIGN COLORECTAL MUCOSA WITH ASSOCIATED BENIGN LYMPHOID AGGREGATE. - NO DYSPLASIA OR MALIGNANCY IDENTIFIED.   CBC Latest Ref Rng & Units 12/31/2020 11/19/2020 11/21/2019  WBC 4.0 - 10.5 K/uL 7.5 9.3 5.7  Hemoglobin 12.0 - 15.0 g/dL 11.2(L) 9.0(L) 11.7  Hematocrit 36.0 - 46.0 % 34.4(L) 30.1(L) 35.5  Platelets 150.0 - 400.0 K/uL 290.0 570(H) 303    CMP Latest Ref Rng & Units  11/19/2020 11/21/2019 07/19/2019  Glucose 65 - 99 mg/dL 98 78 88  BUN 7 - 25 mg/dL 13 15 14   Creatinine 0.50 - 1.05 mg/dL 0.73 0.81 0.93  Sodium 135 - 146 mmol/L 143 139 143  Potassium 3.5 - 5.3 mmol/L 3.9 4.0 3.9  Chloride 98 - 110 mmol/L 106 104 107  CO2 20 - 32 mmol/L 22 26 28   Calcium 8.6 - 10.4 mg/dL 9.4 9.3 9.3  Total Protein 6.1 - 8.1 g/dL 7.7 7.4 7.3  Total Bilirubin 0.2 - 1.2 mg/dL 0.3 0.3 0.3  Alkaline Phos 38 - 126 U/L - - -  AST 10 - 35 U/L 18 15 14   ALT 6 - 29 U/L 10 8 8       Current Medications, Allergies, Past Medical History, Past Surgical History, Family History and Social History were reviewed in [employer] record.   Review of Systems:   Constitutional: Negative for fever, sweats, chills or weight loss.  Respiratory: Negative for shortness of breath.   Cardiovascular: Negative for chest pain, palpitations and leg swelling.  Gastrointestinal: See HPI.  Musculoskeletal: Negative for back pain or muscle aches.  Neurological: Negative for dizziness, headaches or paresthesias.    Physical Exam: LMP 06/25/2016  General: Well developed, w   ***female in no acute distress. Head: Normocephalic and atraumatic. Eyes: No scleral icterus. Conjunctiva pink . Ears: Normal auditory acuity. Mouth: Dentition intact. No ulcers or lesions.  Lungs: Clear throughout to auscultation. Heart: Regular rate and rhythm, no murmur. Abdomen: Soft, nontender and nondistended. No masses or hepatomegaly. Normal bowel sounds x 4 quadrants.  Rectal: *** Musculoskeletal: Symmetrical with no gross deformities.  Extremities: No edema. Neurological: Alert oriented x 4. No focal deficits.  Psychological: Alert and cooperative. Normal mood and affect  Assessment and Recommendations: ***

## 2021-05-06 ENCOUNTER — Other Ambulatory Visit: Payer: Self-pay | Admitting: Internal Medicine

## 2021-05-06 DIAGNOSIS — E2839 Other primary ovarian failure: Secondary | ICD-10-CM

## 2021-05-12 ENCOUNTER — Ambulatory Visit: Payer: Medicare Other | Admitting: Obstetrics

## 2021-06-02 ENCOUNTER — Ambulatory Visit: Payer: Medicare Other

## 2021-07-21 ENCOUNTER — Telehealth: Payer: Self-pay | Admitting: Internal Medicine

## 2021-07-21 ENCOUNTER — Ambulatory Visit: Payer: Medicare Other | Admitting: Internal Medicine

## 2021-07-21 NOTE — Telephone Encounter (Signed)
Pt states after her colon she noticed she has these inflamed things come up on her bottom and a few days later they would be gone. Discussed with her that they could be hemorrhoids. She states they are not painful. Pt has rescheduled her appt for March, she will discuss this further at that visit.

## 2021-07-21 NOTE — Telephone Encounter (Signed)
Patient called to cancel appointment for today and reschedule.   Also stated that she had some " health" questions for the nurse. Please advise.

## 2021-08-04 ENCOUNTER — Other Ambulatory Visit: Payer: Self-pay | Admitting: Internal Medicine

## 2021-08-04 DIAGNOSIS — Z1231 Encounter for screening mammogram for malignant neoplasm of breast: Secondary | ICD-10-CM

## 2021-08-06 ENCOUNTER — Ambulatory Visit: Payer: Medicare Other

## 2021-08-19 ENCOUNTER — Ambulatory Visit: Payer: Medicare Other | Admitting: Obstetrics

## 2021-08-28 ENCOUNTER — Ambulatory Visit: Payer: Medicare Other

## 2021-08-28 ENCOUNTER — Encounter: Payer: Self-pay | Admitting: *Deleted

## 2021-09-08 ENCOUNTER — Ambulatory Visit: Payer: Medicare Other | Admitting: Internal Medicine

## 2021-10-11 NOTE — Progress Notes (Deleted)
10/11/2021 [name] [id number] 10/21/1965   Chief Complaint:  Hemorrhoids  History of Present Illness:  [name]. Wuest is a 56 year old female with a past medical history of anxiety, arthritis, hypertension, hypothyroidism, COPD, DM, Lupus, psoriasis, GERD,      Latest Ref Rng & Units 12/31/2020    3:42 PM 11/19/2020   12:00 AM 11/21/2019    2:50 PM  CBC  WBC 4.0 - 10.5 K/uL 7.5   9.3   5.7    Hemoglobin 12.0 - 15.0 g/dL 11.2   9.0   11.7    Hematocrit 36.0 - 46.0 % 34.4   30.1   35.5    Platelets 150.0 - 400.0 K/uL 290.0   570   303         Latest Ref Rng & Units 11/19/2020   12:00 AM 11/21/2019    2:50 PM 07/19/2019    2:05 PM  CMP  Glucose 65 - 99 mg/dL 98   78   88    BUN 7 - 25 mg/dL '13   15   14    '$ Creatinine 0.50 - 1.05 mg/dL 0.73   0.81   0.93    Sodium 135 - 146 mmol/L 143   139   143    Potassium 3.5 - 5.3 mmol/L 3.9   4.0   3.9    Chloride 98 - 110 mmol/L 106   104   107    CO2 20 - 32 mmol/L '22   26   28    '$ Calcium 8.6 - 10.4 mg/dL 9.4   9.3   9.3    Total Protein 6.1 - 8.1 g/dL 7.7   7.4   7.3    Total Bilirubin 0.2 - 1.2 mg/dL 0.3   0.3   0.3    AST 10 - 35 U/L '18   15   14    '$ ALT 6 - 29 U/L '10   8   8       '$ EGD 02/04/2021 by Dr. Hilarie Maxwell: - Normal mucosa was found in the entire esophagus. - 1 cm hiatal hernia. - No endoscopic esophageal abnormality to explain patient's dysphagia. Esophagus dilated with 52 Fr Maloney. - A few gastric polyps. Sampled by biopsy. - Biopsies from stomach were taken with a cold forceps for histology and Helicobacter pylori testing. - Normal examined duodenum.  Colonoscopy 02/04/2021 by Dr. Hilarie Maxwell. - Diverticulosis in the sigmoid colon. - The examination was otherwise normal on direct and retroflexion views. - No specimens collected. -Repeat colonoscopy 10 years   Past Medical History:  Diagnosis Date   Allergy    Anxiety    Arthritis    ra, lupus - ankles, knees, hands, neck   Blood transfusion without  reported diagnosis [place] with c/s surgery   Bronchitis    Hx   Cancer (HCC)    nasopharynx   COPD (chronic obstructive pulmonary disease) (HCC)    DDD (degenerative disc disease), lumbar    Diabetes mellitus without complication ([place])    related to prednisone use, no meds,cbg normal   Diverticulosis    Excessive or frequent menstruation    GERD (gastroesophageal reflux disease)    Headache(784.0)    Hiatal hernia    Hypertension    Hypothyroidism    Irregular menstrual cycle    Leiomyoma of uterus, unspecified    Lupus ([place])    Migraines    Neuromuscular disorder ([place])  neuropathy in legs/feet   Neuropathy    Papanicolaou smear of cervix with atypical squamous cells of undetermined significance (ASC-US)    Psoriasis    Sinusitis    Trichimoniasis    Wears partial dentures    full upper and lower partial   Past Surgical History:  Procedure Laterality Date   ANKLE ARTHROSCOPY Right    CESAREAN SECTION     x 1   DILATION AND CURETTAGE OF UTERUS     x 1 Missed Abortion   ESOPHAGEAL DILATION     LYMPH NODE BIOPSY     neck   MULTIPLE EXTRACTIONS WITH ALVEOLOPLASTY N/A 03/05/2013   Procedure: MULTIPLE EXTRACION WITH ALVEOLOPLASTY, extraction of decayed teeth numbers 3,4,5,18,19,22,23,24,25, extraction of retained root tips teeth numbers 2,20,21,26, bilateral mandibular alveoloplasty and upper right maxillary alveoloplasty;  Surgeon: Isac Caddy, DDS;  Location: [facility];  Service: Oral Surgery;  Laterality: N/A;   scalp biopsy     TOOTH EXTRACTION Bilateral 03/05/2013   Procedure: EXTRACTION MOLARS;  Surgeon: Isac Caddy, DDS;  Location: [facility];  Service: Oral Surgery;  Laterality: Bilateral;       Current Medications, Allergies, Past Medical History, Past Surgical History, Family History and Social History were reviewed in [employer] record.   Review of Systems:   Constitutional: Negative for fever, sweats, chills or  weight loss.  Respiratory: Negative for shortness of breath.   Cardiovascular: Negative for chest pain, palpitations and leg swelling.  Gastrointestinal: See HPI.  Musculoskeletal: Negative for back pain or muscle aches.  Neurological: Negative for dizziness, headaches or paresthesias.    Physical Exam: LMP 06/25/2016  General: Well developed, w   ***female in no acute distress. Head: Normocephalic and atraumatic. Eyes: No scleral icterus. Conjunctiva pink . Ears: Normal auditory acuity. Mouth: Dentition intact. No ulcers or lesions.  Lungs: Clear throughout to auscultation. Heart: Regular rate and rhythm, no murmur. Abdomen: Soft, nontender and nondistended. No masses or hepatomegaly. Normal bowel sounds x 4 quadrants.  Rectal: *** Musculoskeletal: Symmetrical with no gross deformities. Extremities: No edema. Neurological: Alert oriented x 4. No focal deficits.  Psychological: Alert and cooperative. Normal mood and affect  Assessment and Recommendations: ***

## 2021-10-12 ENCOUNTER — Ambulatory Visit: Payer: Medicare Other | Admitting: Nurse Practitioner

## 2021-10-16 ENCOUNTER — Institutional Professional Consult (permissible substitution): Payer: Medicare Other | Admitting: Internal Medicine

## 2021-10-16 NOTE — Progress Notes (Deleted)
Subjective:    Patient ID: [name], female    DOB: [birth year]  MRN: [id number]    Brief patient profile:  56 yobfbf quit smoking 2005 with dx of nasal ca/ s/p radiation  On prednisone since about the same time for dx of sle  started on mtx 2012 and tapered down to 2.5 mg mid Oct 2014  which corresponded to onset  of cp and sob referred to pulmonary p admit for eval of Ant  R pleuritic chest pain, hacking dry cough with intermittent hemoptysis as follows   Admit date: 06/19/2013  Discharge date: 06/21/2012   Patient was unhappy that her chronic symptoms were not resolved prior to discharge. She complained that the admitting doctor told her that her pleurisy would be resolved before discharge.  Recommendations for Outpatient Follow-up:   Appt with Dr. Melvyn Novas 06/29/12 for follow up - dyspnea, chronic cough, min. Hemoptysis.  Follow up with Dr. Trudie Reed as previously scheduled on 07/12/13 for lupus.  Discharge Diagnoses:   Pleurisy   Chronic use of steroids  Lupus (systemic lupus erythematosus)  Psoriasis  GERD (gastroesophageal reflux disease)  Hypothyroidism  Discharge Condition: stable, still with dyspnea and cough. Condition is chronic and stable for outpatient follow up.  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 Mohamed and [place]hip Bottom lupus and psoriasis (rheumatologist is Dr. HTrudie Reed. Presents with two months of dyspnea and cough. Her shortness of breath is unchanged at rest vs with mobility. She has taken courses of clinda and levaquin without improvement. She has been tapering down her chronic prednisone for 6 months. She mentions that 12/29 she had an episode of hemoptysis x sev tsp of brb Hospital Course:  SOB with cough  CT Angio completed 12/22 shows no  PE  Discussed via telephone with Dr. AElsworth Soho He was unimpressed with ground glass opacity on CT & recommended OP follow up.  Appointment scheduled with Dr. WMelvyn Novas  Patient being treated without antibiotics.  Slight improvement on steroids.  Patient stable and can be treated as outpatient.  Will check 2D echo and BNP before she goes. Results to be followed outpatient.  Lupus  Per Dr. HTrudie ReedOutpatient  On prednisone. She received solumedrol in ED and admitted increased her PO dose to '20mg'$  daily.  Psoriasis  Notable on skin but appears controlled.  Prednisone.  Hypothyroidism  Synthroid    06/29/2013 new pt eval  /Ameen Mostafa Chief Complaint  Patient presents with   Pulmonary Consult    referred by Dr. SConley Canalfor SOB and Hemoptysis.  last dose of Prednisone 06/25/13 felt worse since, cp was better while on higher doses of steroids and now is bialteral band like pleuritica around lower chest but worst on R below and just lateral to  R breast  Last levaquin 06/29/13 no fever or purulent sputum, no further hemoptysis. Sob x more than slow adls rec Stop fosfamax  protonix (pantoprazole)  Take 30- 60 min before your first and last meals of the day  Prednisone 10 mg take 2 with bfast and supper until better (or you see Dr HTrudie Reed then take one twice daily > [facility]    07/31/2013 f/u ov/Timber Lucarelli re: back  On  mtx and prn celebrex and maint symbicort  Chief Complaint  Patient presents with   Follow-up    Review pft.  Pt denies any breathing problems at this time.  Not limited by breathing from desired activities  No need for prn saba arthritis even better rec Ok to take symbicort if you feel it really helps your breathing F/u is prn      05/06/2014 acute  ov/Raiford Fetterman re: recurrent pleuritic R cp deveshar Chief Complaint  Patient presents with   Acute Visit    Pt c/o pain under rt breast "comes and goes".  She states that this has affected her breathing for the past month or  so- relates to weather change.   exact same area, this time with sob even if not hurting though pain "cuts off her breathing" with exertion and when she says "come and goes" she means always has it if she takes a deep breath/ presently just on mtx and started back on symbicort at onset but no better. Some better with vicodin  Rec Prednisone 10 mg Take 4 for three days 3 for three days 2 for three days 1 for three days and stop - generally taken at breakfast       10/16/2021  Re-establish ov/Fantasha Daniele re: ***   maint on ***  No chief complaint on file.   Dyspnea:  *** Cough: *** Sleeping: *** SABA use: *** 02: *** Covid status:   ***   No obvious day to day or daytime variability or assoc excess/ purulent sputum or mucus plugs or hemoptysis or cp or chest tightness, subjective wheeze or overt sinus or hb symptoms.   *** without nocturnal  or early am exacerbation  of respiratory  c/o's or need for noct saba. Also denies any obvious fluctuation of symptoms with weather or environmental changes or other aggravating or alleviating factors except as outlined above   No unusual exposure hx or h/o childhood pna/ asthma or knowledge of premature birth.  Current Allergies, Complete Past Medical History, Past Surgical History, Family History, and Social History were reviewed in [employer] record.  ROS  The following are not active complaints unless bolded Hoarseness, sore throat, dysphagia, dental problems, itching, sneezing,  nasal congestion or discharge of excess mucus or purulent secretions, ear ache,   fever, chills, sweats, unintended wt loss or wt gain, classically pleuritic or exertional cp,  orthopnea pnd or arm/hand swelling  or leg swelling, presyncope, palpitations, abdominal pain, anorexia, nausea, vomiting, diarrhea  or change in bowel habits or change in bladder habits, change in stools or change in urine, dysuria, hematuria,  rash, arthralgias, visual complaints,  headache, numbness, weakness or ataxia or problems with walking or coordination,  change in mood or  memory.        No outpatient medications have been marked as taking for the 10/16/21 encounter (Appointment) with Tanda Rockers, MD.                   Objective:   Physical Exam   Wts  10/16/2021        ***  07/31/2013       204 > 05/06/2014  170      06/29/13 214 lb (97.07 kg)  06/21/13 207 lb 1.6 oz (93.94 kg)  06/11/13 216 lb (97.977 kg)           Assessment & Plan:

## 2021-10-22 ENCOUNTER — Ambulatory Visit: Payer: Medicare Other | Admitting: Obstetrics

## 2021-10-23 ENCOUNTER — Other Ambulatory Visit: Payer: Medicare Other

## 2021-10-23 ENCOUNTER — Ambulatory Visit: Payer: Medicare Other

## 2021-10-27 ENCOUNTER — Other Ambulatory Visit: Payer: Self-pay

## 2021-10-27 MED ORDER — METOPROLOL SUCCINATE ER 25 MG PO TB24: ORAL_TABLET | ORAL | 0 refills | Status: DC

## 2021-10-27 NOTE — Telephone Encounter (Signed)
Metoprolol Succinate ER 25 mg # 30 tablets only sent to [facility] with message for pharmacy and patient to contact her primary care provider for future refill. Patient not seen since 2020 and is following our office only as needed.  ?

## 2021-10-28 ENCOUNTER — Other Ambulatory Visit: Payer: Self-pay | Admitting: Cardiology

## 2021-11-05 ENCOUNTER — Ambulatory Visit: Payer: Medicare Other | Admitting: Obstetrics

## 2021-11-09 ENCOUNTER — Inpatient Hospital Stay: Admission: RE | Admit: 2021-11-09 | Payer: Medicare Other | Source: Ambulatory Visit

## 2021-11-18 ENCOUNTER — Other Ambulatory Visit: Payer: Self-pay | Admitting: Cardiology

## 2021-11-24 ENCOUNTER — Telehealth: Payer: Self-pay

## 2021-11-24 ENCOUNTER — Telehealth: Payer: Self-pay | Admitting: Cardiology

## 2021-11-24 NOTE — Telephone Encounter (Signed)
Pt c/o medication issue:  1. Name of Medication: metoprolol succinate (TOPROL-XL) 25 MG 24 hr tablet  2. How are you currently taking this medication (dosage and times per day)? TAKE 1 TABLET(25 MG) BY MOUTH DAILYTAKE 1 TABLET(25 MG) BY MOUTH DAILY   3. Are you having a reaction (difficulty breathing--STAT)? No   4. What is your medication issue? Patient called to see why her medication can be refill. She states that last time she was in office she was told to schd appt as need.

## 2021-11-24 NOTE — Telephone Encounter (Signed)
Spoke with pt about Metoprolol refill. She is seeing her PCP and knows to have her medication refilled with PCP. She knows she can call for an appt if needed. The refill was sent here in error as she has plenty of medication left. Pt verbalized understanding and had no further questions or concerns.

## 2021-12-16 ENCOUNTER — Ambulatory Visit: Payer: Medicare Other | Admitting: Obstetrics and Gynecology

## 2021-12-30 ENCOUNTER — Inpatient Hospital Stay: Admission: RE | Admit: 2021-12-30 | Payer: Medicare Other | Source: Ambulatory Visit

## 2021-12-30 ENCOUNTER — Other Ambulatory Visit: Payer: Self-pay | Admitting: Internal Medicine

## 2021-12-30 DIAGNOSIS — E2839 Other primary ovarian failure: Secondary | ICD-10-CM

## 2022-01-20 ENCOUNTER — Ambulatory Visit: Payer: Medicare Other

## 2022-01-28 ENCOUNTER — Other Ambulatory Visit: Payer: Medicare Other

## 2022-01-28 ENCOUNTER — Other Ambulatory Visit: Payer: Self-pay | Admitting: Cardiology

## 2022-03-04 ENCOUNTER — Other Ambulatory Visit: Payer: Medicare Other

## 2022-03-05 ENCOUNTER — Ambulatory Visit: Payer: Medicare Other | Admitting: Obstetrics and Gynecology

## 2022-04-14 ENCOUNTER — Other Ambulatory Visit: Payer: Medicare Other

## 2022-04-14 ENCOUNTER — Ambulatory Visit: Payer: Medicare Other | Admitting: Obstetrics

## 2022-05-06 ENCOUNTER — Ambulatory Visit: Payer: Medicare Other | Admitting: Obstetrics

## 2022-05-20 ENCOUNTER — Ambulatory Visit: Payer: Medicare Other | Admitting: Obstetrics

## 2022-08-27 DIAGNOSIS — G43909 Migraine, unspecified, not intractable, without status migrainosus: Secondary | ICD-10-CM | POA: Diagnosis not present

## 2022-08-27 DIAGNOSIS — Z8709 Personal history of other diseases of the respiratory system: Secondary | ICD-10-CM | POA: Diagnosis not present

## 2022-08-27 DIAGNOSIS — K219 Gastro-esophageal reflux disease without esophagitis: Secondary | ICD-10-CM | POA: Diagnosis not present

## 2022-08-27 DIAGNOSIS — Z13220 Encounter for screening for lipoid disorders: Secondary | ICD-10-CM | POA: Diagnosis not present

## 2022-08-27 DIAGNOSIS — E039 Hypothyroidism, unspecified: Secondary | ICD-10-CM | POA: Diagnosis not present

## 2022-08-27 DIAGNOSIS — L4050 Arthropathic psoriasis, unspecified: Secondary | ICD-10-CM | POA: Diagnosis not present

## 2022-08-27 DIAGNOSIS — G629 Polyneuropathy, unspecified: Secondary | ICD-10-CM | POA: Diagnosis not present

## 2022-08-27 DIAGNOSIS — I10 Essential (primary) hypertension: Secondary | ICD-10-CM | POA: Diagnosis not present

## 2022-08-27 DIAGNOSIS — R2689 Other abnormalities of gait and mobility: Secondary | ICD-10-CM | POA: Diagnosis not present

## 2022-08-27 DIAGNOSIS — M329 Systemic lupus erythematosus, unspecified: Secondary | ICD-10-CM | POA: Diagnosis not present

## 2022-08-30 ENCOUNTER — Encounter: Payer: Self-pay | Admitting: Physician Assistant

## 2022-09-09 ENCOUNTER — Other Ambulatory Visit: Payer: Self-pay | Admitting: Physician Assistant

## 2022-09-09 DIAGNOSIS — Z1231 Encounter for screening mammogram for malignant neoplasm of breast: Secondary | ICD-10-CM

## 2022-09-14 ENCOUNTER — Telehealth: Payer: Self-pay | Admitting: Psychiatry

## 2022-09-14 ENCOUNTER — Ambulatory Visit (INDEPENDENT_AMBULATORY_CARE_PROVIDER_SITE_OTHER): Payer: 59 | Admitting: Psychiatry

## 2022-09-14 ENCOUNTER — Encounter: Payer: Self-pay | Admitting: Psychiatry

## 2022-09-14 VITALS — BP 121/75 | HR 102 | Ht 66.0 in | Wt 144.6 lb

## 2022-09-14 DIAGNOSIS — Z8589 Personal history of malignant neoplasm of other organs and systems: Secondary | ICD-10-CM

## 2022-09-14 DIAGNOSIS — R519 Headache, unspecified: Secondary | ICD-10-CM

## 2022-09-14 DIAGNOSIS — G43019 Migraine without aura, intractable, without status migrainosus: Secondary | ICD-10-CM

## 2022-09-14 DIAGNOSIS — M542 Cervicalgia: Secondary | ICD-10-CM

## 2022-09-14 MED ORDER — QULIPTA 60 MG PO TABS
60.0000 mg | ORAL_TABLET | Freq: Every day | ORAL | 6 refills | Status: DC
Start: 2022-09-14 — End: 2023-03-11

## 2022-09-14 NOTE — Progress Notes (Signed)
Referring:  Trey Sailors, [place],  [place] [address]  PCP: Trey Sailors, PA  Neurology was asked to evaluate Leaner Busa, a 57 year old female for a chief complaint of headaches.  Our recommendations of care will be communicated by shared medical record.    CC:  headaches  History provided from self, sister  HPI:  Medical co-morbidities: SLE, hypothyroidism, psoriasis, kidney stones, asthma, nasopharyngeal cancer s/p radiation  The patient presents for evaluation of headaches. She has had migraines for several years, but they worsened 2 months ago after 2 falls. First time she tripped and fell on some weights and hit the right side of her head. The second time she took tizanidine and got dizzy, then fell and hit the left side of her head. Headaches began 1.5 weeks later. She is currently having headaches daily. They are getting worse over time. Headaches are associated with photophobia, phonophobia, nausea, and neck pain. They can last for 2-3 hours at a time.   She currently takes Maxalt as needed which does help for rescue. Has been taking Topamax for migraine prevention for several years. She does have a history of kidney stones.  Headache History: Onset: 2 months ago Aura: blurry vision Location: forehead, occiput Quality/Description: pressure Associated Symptoms:  Photophobia: yes  Phonophobia: yes  Nausea: yes Worse with activity?: yes Duration of headaches: 2-3 hours  Migraine days per month: 30 Headache free days per month: 0  Current Treatment: Abortive Maxalt 10 mg PRN  Preventative Topamax 100 mg PRN  Prior Therapies                                 Rescue: Maxalt 10 mg PRN  Prevention: Topamax 100 mg QHS amitriptyline Cymbalta 30 mg daily Metoprolol 25 mg daily   LABS: CBC    Component Value Date/Time   WBC 7.5 12/31/2020 1542   RBC 3.85 (L) 12/31/2020 1542   HGB 11.2 (L) 12/31/2020 1542   HGB 12.4 10/14/2016  1151   HGB 10.6 (L) 03/25/2009 1245   HCT 34.4 (L) 12/31/2020 1542   HCT 38.7 10/14/2016 1151   HCT 30.9 (L) 03/25/2009 1245   PLT 290.0 12/31/2020 1542   PLT 331 10/14/2016 1151   MCV 89.4 12/31/2020 1542   MCV 93 10/14/2016 1151   MCV 93.1 03/25/2009 1245   MCH 25.7 (L) 11/19/2020 0000   MCHC 32.5 12/31/2020 1542   RDW 20.3 (H) 12/31/2020 1542   RDW 14.0 10/14/2016 1151   RDW 15.8 (H) 03/25/2009 1245   LYMPHSABS 1.4 12/31/2020 1542   LYMPHSABS 2.0 10/14/2016 1151   LYMPHSABS 1.6 03/25/2009 1245   MONOABS 0.4 12/31/2020 1542   MONOABS 0.5 03/25/2009 1245   EOSABS 0.1 12/31/2020 1542   EOSABS 0.1 10/14/2016 1151   BASOSABS 0.1 12/31/2020 1542   BASOSABS 0.0 10/14/2016 1151   BASOSABS 0.0 03/25/2009 1245      Latest Ref Rng & Units 11/19/2020   12:00 AM 11/21/2019    2:50 PM 07/19/2019    2:05 PM  CMP  Glucose 65 - 99 mg/dL 98  78  88   BUN 7 - 25 mg/dL 13  15  14    Creatinine 0.50 - 1.05 mg/dL 0.73  0.81  0.93   Sodium 135 - 146 mmol/L 143  139  143   Potassium 3.5 - 5.3 mmol/L 3.9  4.0  3.9   Chloride  98 - 110 mmol/L 106  104  107   CO2 20 - 32 mmol/L 22  26  28    Calcium 8.6 - 10.4 mg/dL 9.4  9.3  9.3   Total Protein 6.1 - 8.1 g/dL 7.7  7.4  7.3   Total Bilirubin 0.2 - 1.2 mg/dL 0.3  0.3  0.3   AST 10 - 35 U/L 18  15  14    ALT 6 - 29 U/L 10  8  8       IMAGING:  [facility] 01/17/2019: unremarkable  Imaging independently reviewed on September 14, 2022   Current Outpatient Medications on File Prior to Visit  Medication Sig Dispense Refill   albuterol (PROVENTIL HFA;VENTOLIN HFA) 108 (90 BASE) MCG/ACT inhaler Inhale 2 puffs into the lungs every 6 (six) hours as needed for wheezing or shortness of breath.      albuterol (PROVENTIL) (2.5 MG/3ML) 0.083% nebulizer solution Take 2.5 mg by nebulization every 6 (six) hours as needed for wheezing or shortness of breath. Reported on 10/27/2015     Ascorbic Acid (VITAMIN C) 1000 MG tablet Take 1,000 mg by mouth 2 (two) times daily.      calcium-vitamin D (OSCAL WITH D) 500-200 MG-UNIT per tablet Take 1 tablet by mouth 2 (two) times daily.     cetirizine (ZYRTEC) 10 MG tablet Take 10 mg by mouth daily.     COSENTYX 300 DOSE 150 MG/ML SOSY Inject 300 mg into the muscle every 28 (twenty-eight) days.      furosemide (LASIX) 40 MG tablet Take 40 mg by mouth daily as needed for fluid or edema.      gabapentin (NEURONTIN) 100 MG capsule Take 100 mg by mouth 3 (three) times daily.      glucosamine-chondroitin 500-400 MG tablet Take 1 tablet by mouth 2 (two) times daily.     hydrOXYzine (ATARAX/VISTARIL) 10 MG tablet Take 10 mg by mouth at bedtime as needed for itching or anxiety. Reported on 10/27/2015     Iron-FA-B Cmp-C-Biot-Probiotic (FUSION PLUS) CAPS Take 1 capsule by mouth daily.     levothyroxine (SYNTHROID, LEVOTHROID) 75 MCG tablet Take 75 mcg by mouth daily.     metoprolol succinate (TOPROL-XL) 25 MG 24 hr tablet TAKE 1 TABLET(25 MG) BY MOUTH DAILYTAKE 1 TABLET(25 MG) BY MOUTH DAILY /  PLEASE CONTACT YOUR PRIMARY CARE PROVIDER FOR FUTURE REFILLS 30 tablet 0   mometasone (NASONEX) 50 MCG/ACT nasal spray Place 2 sprays into the nose daily.   5   Multiple Vitamin (MULTIVITAMIN WITH MINERALS) TABS Take 1 tablet by mouth daily.     pantoprazole (PROTONIX) 40 MG tablet Take 40 mg by mouth daily.      potassium chloride (K-DUR) 10 MEQ tablet Take 1 tablet by mouth daily.     rizatriptan (MAXALT-MLT) 10 MG disintegrating tablet Take 10 mg by mouth as needed for migraine.   5   SYMBICORT 160-4.5 MCG/ACT inhaler Inhale 2 puffs into the lungs 2 (two) times daily.  5   topiramate (TOPAMAX) 100 MG tablet Take 100 mg by mouth at bedtime.      triamcinolone cream (KENALOG) 0.1 % Apply topically 2 (two) times daily as needed.     VITAMIN E PO Take 400 mg by mouth 2 (two) times daily.      diclofenac (VOLTAREN) 75 MG EC tablet Take 1 tablet by mouth 2 (two) times daily. (Patient not taking: Reported on 09/14/2022)     nitroGLYCERIN (NITROSTAT)  0.4 MG SL tablet Place 1  tablet (0.4 mg total) under the tongue every 5 (five) minutes as needed for chest pain. 25 tablet 3   No current facility-administered medications on file prior to visit.     Allergies: Allergies  Allergen Reactions   Amoxicillin Itching    Did it involve swelling of the face/tongue/throat, SOB, or low BP? No Did it involve sudden or severe rash/hives, skin peeling, or any reaction on the inside of your mouth or nose? Yes Did you need to seek medical attention at a hospital or doctor's office? Yes When did it last happen?      14 yrs ago If all above answers are "NO", may proceed with cephalosporin use.    Latex Itching   Percocet [Oxycodone-Acetaminophen] Itching   Plaquenil [Hydroxychloroquine Sulfate] Other (See Comments)    "Psoriasis."   Bactrim [Sulfamethoxazole-Trimethoprim] Rash   Ciprofloxacin Hcl Rash   Keflex [Cephalexin] Rash   Penicillins Rash    Did it involve swelling of the face/tongue/throat, SOB, or low BP? No Did it involve sudden or severe rash/hives, skin peeling, or any reaction on the inside of your mouth or nose? Yes Did you need to seek medical attention at a hospital or doctor's office? Yes When did it last happen?      14 yrs ago If all above answers are "NO", may proceed with cephalosporin use.    Tetracyclines & Related Rash    Family History: Family History  Problem Relation Age of Onset   Hypertension Mother    Diabetes Mother    Hypertension Father    Lupus Brother    Breast cancer Maternal Grandmother 67   Colon cancer Neg Hx    Esophageal cancer Neg Hx    Rectal cancer Neg Hx    Stomach cancer Neg Hx    Colon polyps Neg Hx      Past Medical History: Past Medical History:  Diagnosis Date   Allergy    Anxiety    Arthritis    ra, lupus - ankles, knees, hands, neck   Blood transfusion without reported diagnosis [place] with c/s surgery   Bronchitis    Hx   Cancer ([facility])    nasopharynx   COPD  (chronic obstructive pulmonary disease) (HCC)    DDD (degenerative disc disease), lumbar    Diabetes mellitus without complication ([place])    related to prednisone use, no meds,cbg normal   Diverticulosis    Excessive or frequent menstruation    GERD (gastroesophageal reflux disease)    Headache(784.0)    Hiatal hernia    Hypertension    Hypothyroidism    Irregular menstrual cycle    Leiomyoma of uterus, unspecified    Lupus (HCC)    Migraines    Neuromuscular disorder (HCC)    neuropathy in legs/feet   Neuropathy    Papanicolaou smear of cervix with atypical squamous cells of undetermined significance (ASC-US)    Psoriasis    Sinusitis    Trichimoniasis    Wears partial dentures    full upper and lower partial    Past Surgical History Past Surgical History:  Procedure Laterality Date   ANKLE ARTHROSCOPY Right    CESAREAN SECTION     x 1   DILATION AND CURETTAGE OF UTERUS     x 1 Missed Abortion   ESOPHAGEAL DILATION     LYMPH NODE BIOPSY     neck   MULTIPLE EXTRACTIONS WITH ALVEOLOPLASTY N/A 03/05/2013   Procedure: MULTIPLE EXTRACION WITH ALVEOLOPLASTY, extraction  of decayed teeth numbers 3,4,5,18,19,22,23,24,25, extraction of retained root tips teeth numbers 2,20,21,26, bilateral mandibular alveoloplasty and upper right maxillary alveoloplasty;  Surgeon: Isac Caddy, DDS;  Location: [facility];  Service: Oral Surgery;  Laterality: N/A;   scalp biopsy     TOOTH EXTRACTION Bilateral 03/05/2013   Procedure: EXTRACTION MOLARS;  Surgeon: Isac Caddy, DDS;  Location: [facility];  Service: Oral Surgery;  Laterality: Bilateral;    Social History: Social History   Tobacco Use   Smoking status: Former    Packs/day: 0.25    Years: 8.00    Additional pack years: 0.00    Total pack years: 2.00    Types: Cigarettes    Quit date: 02/28/2004    Years since quitting: 18.5   Smokeless tobacco: Never  Vaping Use   Vaping Use: Never used  Substance Use Topics   Alcohol  use: Not Currently    Alcohol/week: 0.0 standard drinks of alcohol   Drug use: No    ROS: Negative for fevers, chills. Positive for headaches. All other systems reviewed and negative unless stated otherwise in HPI.   Physical Exam:   Vital Signs: BP 121/75 (BP Location: Left Arm, Patient Position: Sitting, Cuff Size: Normal)   Pulse (!) 102   Ht 5\' 6"  (1.676 m)   Wt 144 lb 9.6 oz (65.6 kg)   LMP 06/25/2016   BMI 23.34 kg/m  GENERAL: well appearing,in no acute distress,alert SKIN:  Color, texture, turgor normal. No rashes or lesions HEAD:  Normocephalic/atraumatic. CV:  RRR RESP: Normal respiratory effort MSK: +tenderness to palpation over L>R occiput, neck, and shoulders  NEUROLOGICAL: Mental Status: Alert, oriented to person, place and time,Follows commands Cranial Nerves: PERRL, visual fields intact to confrontation, extraocular movements intact, facial sensation intact, no facial droop or ptosis, hearing grossly intact, no dysarthria Motor: muscle strength 5/5 both upper and lower extremities Reflexes: 2+ throughout Sensation: intact to light touch all 4 extremities Coordination: Finger-to- nose-finger intact bilaterally Gait: normal-based   IMPRESSION: 57 year old female with a history of SLE, hypothyroidism, psoriasis, kidney stones, asthma, nasopharyngeal cancer s/p radiation who presents for evaluation of worsening headaches over the past 2 months. She did have 2 falls in that time and has not sought evaluation before now. Will order brain MRI given continued worsening of headaches and history of head/neck cancer s/p radiation. She continues to have daily headaches despite Topamax treatment. Prefers to avoid injections if possible. Will start Qulipta for migraine prevention. Ideally would be able to wean off of Topamax if headaches stabilize as she does have a history of kidney stones. Will continue Maxalt for rescue. Referral to PT placed for neck pain.  PLAN: -MRI  brain -Prevention: Start Qulipta 60 mg daily, continue Topamax 100 mg QHS for now -Rescue: Continue Maxalt 10 mg PRN -Referral to neck PT -Next steps: consider CGRP (she is OK with a pen injector if needed)   I spent a total of 26 minutes chart reviewing and counseling the patient. Headache education was done. Discussed treatment options including preventive and acute medications. Discussed medication side effects, adverse reactions and drug interactions. Written educational materials and patient instructions outlining all of the above were given.  Follow-up: 6 months   Genia Harold, MD 09/14/2022   1:40 PM

## 2022-09-14 NOTE — Telephone Encounter (Signed)
[employer] medicare/[place] medicaid NPR sent to [facility] [phone] 

## 2022-09-15 ENCOUNTER — Other Ambulatory Visit: Payer: Self-pay | Admitting: Physician Assistant

## 2022-09-15 DIAGNOSIS — E2839 Other primary ovarian failure: Secondary | ICD-10-CM

## 2022-09-16 ENCOUNTER — Ambulatory Visit
Admission: RE | Admit: 2022-09-16 | Discharge: 2022-09-16 | Disposition: A | Discharge status: Home / Self Care | Payer: 59 | Source: Ambulatory Visit | Attending: Internal Medicine | Admitting: Internal Medicine

## 2022-09-16 ENCOUNTER — Ambulatory Visit
Admission: RE | Admit: 2022-09-16 | Discharge: 2022-09-16 | Disposition: A | Discharge status: Home / Self Care | Payer: 59 | Source: Ambulatory Visit | Attending: Psychiatry | Admitting: Psychiatry

## 2022-09-16 DIAGNOSIS — Z8589 Personal history of malignant neoplasm of other organs and systems: Secondary | ICD-10-CM

## 2022-09-16 DIAGNOSIS — Z780 Asymptomatic menopausal state: Secondary | ICD-10-CM | POA: Diagnosis not present

## 2022-09-16 DIAGNOSIS — E2839 Other primary ovarian failure: Secondary | ICD-10-CM

## 2022-09-16 DIAGNOSIS — M8589 Other specified disorders of bone density and structure, multiple sites: Secondary | ICD-10-CM | POA: Diagnosis not present

## 2022-09-16 DIAGNOSIS — R519 Headache, unspecified: Secondary | ICD-10-CM | POA: Diagnosis not present

## 2022-09-16 MED ORDER — GADOPICLENOL 0.5 MMOL/ML IV SOLN
7.0000 mL | Freq: Once | INTRAVENOUS | Status: AC | PRN
  Administered 2022-09-16: 7 mL via INTRAVENOUS

## 2022-09-21 ENCOUNTER — Ambulatory Visit: Payer: 59 | Attending: Psychiatry | Admitting: Physical Therapy

## 2022-09-21 ENCOUNTER — Other Ambulatory Visit: Payer: Self-pay

## 2022-09-21 ENCOUNTER — Encounter: Payer: Self-pay | Admitting: Physical Therapy

## 2022-09-21 VITALS — BP 171/89 | HR 101

## 2022-09-21 DIAGNOSIS — R252 Cramp and spasm: Secondary | ICD-10-CM | POA: Insufficient documentation

## 2022-09-21 DIAGNOSIS — M542 Cervicalgia: Secondary | ICD-10-CM | POA: Insufficient documentation
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
## 2022-09-29 ENCOUNTER — Ambulatory Visit: Payer: 59 | Admitting: Physical Therapy

## 2022-09-29 ENCOUNTER — Telehealth: Payer: Self-pay | Admitting: Physical Therapy

## 2022-09-29 ENCOUNTER — Encounter: Payer: Self-pay | Admitting: Physical Therapy

## 2022-09-29 DIAGNOSIS — R252 Cramp and spasm: Secondary | ICD-10-CM

## 2022-09-29 DIAGNOSIS — M542 Cervicalgia: Secondary | ICD-10-CM

## 2022-09-29 DIAGNOSIS — R2689 Other abnormalities of gait and mobility: Secondary | ICD-10-CM

## 2022-09-29 DIAGNOSIS — R42 Dizziness and giddiness: Secondary | ICD-10-CM
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
## 2022-09-29 NOTE — Telephone Encounter (Signed)
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  The patient would benefit from addition referral for evaluation for imbalance and dizziness.   If you agree, please place an order in [facility] workque in [facility] or fax the order to [phone]. Thank you, Camille Bal, PT, DPT  [facility] [address] [place], [place]  [address] Phone:  [phone] Fax:  [phone]

## 2022-09-29 NOTE — Patient Instructions (Signed)
Access Code: [email or web address] URL: [email or web address] Date: 09/29/2022 Prepared by: Camille Bal  Exercises - Seated Upper Trapezius Stretch  - 1 x daily - 5 x weekly - 1 sets - 2 reps - 45 seconds hold - Supine Chin Tuck  - 1 x daily - 5 x weekly - 2 sets - 10 reps - Supine Cervical Retraction with Towel  - 1 x daily - 5 x weekly - 2 sets - 10 reps - Supine Cervical Rotation AROM on Pillow  - 1 x daily - 5 x weekly - 2 sets - 10 reps - Seated Cervical Extension AROM  - 1 x daily - 5 x weekly - 2 sets - 10 reps

## 2022-09-30 NOTE — Addendum Note (Signed)
Addended by: Ocie Doyne on: 09/30/2022 08:51 AM   Modules accepted: Orders

## 2022-10-06 ENCOUNTER — Telehealth: Payer: Self-pay | Admitting: Physical Therapy

## 2022-10-06 ENCOUNTER — Encounter: Payer: Self-pay | Admitting: Physical Therapy

## 2022-10-06 ENCOUNTER — Ambulatory Visit: Payer: 59 | Admitting: Physical Therapy

## 2022-10-06 DIAGNOSIS — M542 Cervicalgia: Secondary | ICD-10-CM

## 2022-10-06 DIAGNOSIS — R2689 Other abnormalities of gait and mobility: Secondary | ICD-10-CM

## 2022-10-06 DIAGNOSIS — R252 Cramp and spasm: Secondary | ICD-10-CM | POA: Diagnosis not present

## 2022-10-06 NOTE — Telephone Encounter (Signed)
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  Since then she has reported ongoing imbalance, generalized loss of strength and intermittent dizziness.  The patient would benefit from PT evaluation and re-cert at end of current cervical focused POC for listed deficits.   If you agree, please place an order in [facility] workque in [facility] or fax the order to [phone]. Thank you, Camille Bal, PT, DPT  [facility] [address] [place], [place]  [address] Phone:  [phone] Fax:  [phone]
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
## 2022-10-07 NOTE — Addendum Note (Signed)
Addended by: Ocie Doyne on: 10/07/2022 08:14 AM   Modules accepted: Orders

## 2022-10-12 DIAGNOSIS — G43909 Migraine, unspecified, not intractable, without status migrainosus: Secondary | ICD-10-CM | POA: Diagnosis not present

## 2022-10-12 DIAGNOSIS — E039 Hypothyroidism, unspecified: Secondary | ICD-10-CM | POA: Diagnosis not present

## 2022-10-12 DIAGNOSIS — L4050 Arthropathic psoriasis, unspecified: Secondary | ICD-10-CM | POA: Diagnosis not present

## 2022-10-12 DIAGNOSIS — E782 Mixed hyperlipidemia: Secondary | ICD-10-CM | POA: Diagnosis not present

## 2022-10-12 DIAGNOSIS — R2689 Other abnormalities of gait and mobility: Secondary | ICD-10-CM | POA: Diagnosis not present

## 2022-10-12 DIAGNOSIS — I10 Essential (primary) hypertension: Secondary | ICD-10-CM | POA: Diagnosis not present

## 2022-10-12 DIAGNOSIS — M329 Systemic lupus erythematosus, unspecified: Secondary | ICD-10-CM | POA: Diagnosis not present

## 2022-10-12 DIAGNOSIS — G629 Polyneuropathy, unspecified: Secondary | ICD-10-CM | POA: Diagnosis not present

## 2022-10-12 DIAGNOSIS — K219 Gastro-esophageal reflux disease without esophagitis: Secondary | ICD-10-CM | POA: Diagnosis not present

## 2022-10-12 DIAGNOSIS — Z8709 Personal history of other diseases of the respiratory system: Secondary | ICD-10-CM | POA: Diagnosis not present

## 2022-10-13 ENCOUNTER — Ambulatory Visit: Payer: 59 | Admitting: Physical Therapy

## 2022-10-13 ENCOUNTER — Ambulatory Visit: Payer: 59 | Admitting: Neurology

## 2022-10-20 ENCOUNTER — Encounter: Payer: Self-pay | Admitting: Physical Therapy

## 2022-10-20 ENCOUNTER — Ambulatory Visit: Payer: 59 | Attending: Psychiatry | Admitting: Physical Therapy

## 2022-10-20 VITALS — BP 147/85 | HR 98

## 2022-10-20 DIAGNOSIS — R42 Dizziness and giddiness: Secondary | ICD-10-CM | POA: Diagnosis not present

## 2022-10-20 DIAGNOSIS — M542 Cervicalgia: Secondary | ICD-10-CM | POA: Diagnosis not present

## 2022-10-20 DIAGNOSIS — R252 Cramp and spasm: Secondary | ICD-10-CM | POA: Insufficient documentation

## 2022-10-20 DIAGNOSIS — R2681 Unsteadiness on feet: Secondary | ICD-10-CM

## 2022-10-20 DIAGNOSIS — R2689 Other abnormalities of gait and mobility: Secondary | ICD-10-CM
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
## 2022-10-25 ENCOUNTER — Ambulatory Visit: Payer: 59 | Admitting: Physical Therapy

## 2022-10-29 ENCOUNTER — Ambulatory Visit: Payer: 59

## 2022-11-01 ENCOUNTER — Encounter: Payer: Self-pay | Admitting: Physical Therapy

## 2022-11-01 ENCOUNTER — Ambulatory Visit: Payer: 59 | Admitting: Physical Therapy

## 2022-11-01 VITALS — BP 190/91 | HR 86

## 2022-11-01 DIAGNOSIS — R252 Cramp and spasm: Secondary | ICD-10-CM

## 2022-11-01 DIAGNOSIS — R42 Dizziness and giddiness: Secondary | ICD-10-CM

## 2022-11-01 DIAGNOSIS — R2681 Unsteadiness on feet: Secondary | ICD-10-CM

## 2022-11-01 DIAGNOSIS — M542 Cervicalgia: Secondary | ICD-10-CM

## 2022-11-01 DIAGNOSIS — R2689 Other abnormalities of gait and mobility: Secondary | ICD-10-CM

## 2022-11-01 NOTE — Therapy (Signed)
[facility] [address] [place], [place], [address] Phone: [phone]   Fax:  [phone]  Patient Details - ARRIVED NO CHARGE Name: [name] MRN: [id number] Date of Birth: Mar 20, 1966 Referring Provider:  Norm Salt, [place]  Encounter Date: 11/01/2022  Patient arrives to physical therapy with husband in lobby.  She states she had a nose bleed approximately 1 hour prior to PT that she had some difficulty getting to stop.  She points out new red lesions over older purple healing lesions on bilateral arms stating she thinks she is having a lupus flare and is concerned because she is only on prednisone.  She endorses that she almost went to the ED instead of therapy today and PT affirms that this might be a good idea following BP assessment w/ pt having unusually high readings and not endorsing pain (see readings below) in combination with other symptoms.  She states she wants to give it 24 more hours.  PT voices concern for this and educates pt on stroke risk and acute signs/symptoms warranting need for immediate assessment.  Pt agrees to monitor symptoms at home.  Husband will be driving her home and will be present with her should she elect to go to the ED.  Encouraged bare minimum follow-up with PCP if symptoms persist >24 hours and she further elects to not go to the ED.  Pt verbalizes understanding and agreement.  Both readings on LUE in sitting:  Today's Vitals   11/01/22 1325 11/01/22 1331  BP: (!) 155/96 (!) 190/91  Pulse: 88 86   There is no height or weight on file to calculate BMI.   Sadie Haber, PT, DPT 11/01/2022, 1:50 PM  [facility] [address] [place], [place], [address] Phone: [phone]   Fax:  [phone]

## 2022-11-08 ENCOUNTER — Ambulatory Visit: Payer: 59 | Admitting: Physical Therapy

## 2022-11-08 ENCOUNTER — Encounter: Payer: Self-pay | Admitting: Physical Therapy

## 2022-11-08 VITALS — BP 169/87 | HR 108

## 2022-11-08 DIAGNOSIS — R2681 Unsteadiness on feet: Secondary | ICD-10-CM | POA: Diagnosis not present

## 2022-11-08 DIAGNOSIS — R42 Dizziness and giddiness: Secondary | ICD-10-CM | POA: Diagnosis not present

## 2022-11-08 DIAGNOSIS — M542 Cervicalgia: Secondary | ICD-10-CM | POA: Diagnosis not present

## 2022-11-08 DIAGNOSIS — R2689 Other abnormalities of gait and mobility: Secondary | ICD-10-CM | POA: Diagnosis not present

## 2022-11-08 DIAGNOSIS — R252 Cramp and spasm: Secondary | ICD-10-CM

## 2022-11-08 NOTE — Patient Instructions (Addendum)
Access Code: [email or web address] URL: [email or web address] Date: 11/08/2022 Prepared by: Camille Bal  Exercises - Seated Upper Trapezius Stretch  - 1 x daily - 5 x weekly - 1 sets - 2 reps - 45 seconds hold - Supine Chin Tuck  - 1 x daily - 5 x weekly - 2 sets - 10 reps - Supine Cervical Retraction with Towel  - 1 x daily - 5 x weekly - 2 sets - 10 reps - Supine Cervical Rotation AROM on Pillow  - 1 x daily - 5 x weekly - 2 sets - 10 reps - Seated Cervical Extension AROM  - 1 x daily - 5 x weekly - 2 sets - 10 reps - Tandem Walking with Counter Support  - 1 x daily - 5 x weekly - 3 sets - 10 reps - Romberg Stance Eyes Closed on Foam Pad  - 1 x daily - 5 x weekly - 3 sets - 10 reps - Toe Walking  - 1 x daily - 5 x weekly - 3 sets - 10 reps - Sit to Stand with Armchair  - 1 x daily - 5 x weekly - 3 sets - 5-8 reps
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
## 2022-11-11 DIAGNOSIS — H40013 Open angle with borderline findings, low risk, bilateral: Secondary | ICD-10-CM | POA: Diagnosis not present

## 2022-11-11 DIAGNOSIS — H10413 Chronic giant papillary conjunctivitis, bilateral: Secondary | ICD-10-CM | POA: Diagnosis not present

## 2022-11-11 DIAGNOSIS — H2513 Age-related nuclear cataract, bilateral: Secondary | ICD-10-CM | POA: Diagnosis not present

## 2022-11-13 ENCOUNTER — Telehealth: Payer: Self-pay

## 2022-11-13 ENCOUNTER — Other Ambulatory Visit (HOSPITAL_COMMUNITY): Payer: Self-pay

## 2022-11-13 NOTE — Telephone Encounter (Signed)
Patient Advocate Encounter   Received notification from [employer] that prior authorization is required for Qulipta 60MG  tablets   Submitted: 11-13-2022 Key [id number]  Status is pending

## 2022-11-15 ENCOUNTER — Other Ambulatory Visit (HOSPITAL_COMMUNITY): Payer: Self-pay

## 2022-11-15 NOTE — Telephone Encounter (Signed)
Pharmacy Patient Advocate Encounter  Prior Authorization for Qulipta 60MG  tablets has been approved by [employer]  (ins).    PA # PA Case ID #: [id number] Effective dates: 11/13/2022 through 06/21/2023  Copay is $0 per [facility] test claim.

## 2022-11-17 ENCOUNTER — Ambulatory Visit: Payer: 59 | Admitting: Physical Therapy

## 2022-11-17 ENCOUNTER — Encounter: Payer: Self-pay | Admitting: Physical Therapy

## 2022-11-17 VITALS — BP 128/73 | HR 96

## 2022-11-17 DIAGNOSIS — R2689 Other abnormalities of gait and mobility: Secondary | ICD-10-CM | POA: Diagnosis not present

## 2022-11-17 DIAGNOSIS — R42 Dizziness and giddiness: Secondary | ICD-10-CM | POA: Diagnosis not present

## 2022-11-17 DIAGNOSIS — R2681 Unsteadiness on feet: Secondary | ICD-10-CM | POA: Diagnosis not present

## 2022-11-17 DIAGNOSIS — M542 Cervicalgia: Secondary | ICD-10-CM

## 2022-11-17 DIAGNOSIS — R252 Cramp and spasm: Secondary | ICD-10-CM | POA: Diagnosis not present
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
## 2022-11-18 DIAGNOSIS — Z8709 Personal history of other diseases of the respiratory system: Secondary | ICD-10-CM | POA: Diagnosis not present

## 2022-11-18 DIAGNOSIS — M329 Systemic lupus erythematosus, unspecified: Secondary | ICD-10-CM | POA: Diagnosis not present

## 2022-11-18 DIAGNOSIS — E039 Hypothyroidism, unspecified: Secondary | ICD-10-CM | POA: Diagnosis not present

## 2022-11-18 DIAGNOSIS — L4050 Arthropathic psoriasis, unspecified: Secondary | ICD-10-CM | POA: Diagnosis not present

## 2022-11-18 DIAGNOSIS — G629 Polyneuropathy, unspecified: Secondary | ICD-10-CM | POA: Diagnosis not present

## 2022-11-18 DIAGNOSIS — E782 Mixed hyperlipidemia: Secondary | ICD-10-CM | POA: Diagnosis not present

## 2022-11-18 DIAGNOSIS — R2689 Other abnormalities of gait and mobility: Secondary | ICD-10-CM | POA: Diagnosis not present

## 2022-11-18 DIAGNOSIS — K219 Gastro-esophageal reflux disease without esophagitis: Secondary | ICD-10-CM | POA: Diagnosis not present

## 2022-11-18 DIAGNOSIS — R21 Rash and other nonspecific skin eruption: Secondary | ICD-10-CM | POA: Diagnosis not present

## 2022-11-18 DIAGNOSIS — G43909 Migraine, unspecified, not intractable, without status migrainosus: Secondary | ICD-10-CM | POA: Diagnosis not present

## 2022-11-24 ENCOUNTER — Telehealth: Payer: Self-pay | Admitting: Rheumatology

## 2022-11-24 ENCOUNTER — Encounter: Payer: Self-pay | Admitting: Physical Therapy

## 2022-11-24 ENCOUNTER — Ambulatory Visit: Payer: 59 | Attending: Psychiatry | Admitting: Physical Therapy

## 2022-11-24 VITALS — BP 156/84 | HR 71

## 2022-11-24 DIAGNOSIS — R252 Cramp and spasm: Secondary | ICD-10-CM | POA: Insufficient documentation

## 2022-11-24 DIAGNOSIS — R2689 Other abnormalities of gait and mobility: Secondary | ICD-10-CM | POA: Diagnosis not present

## 2022-11-24 DIAGNOSIS — R42 Dizziness and giddiness: Secondary | ICD-10-CM | POA: Diagnosis not present

## 2022-11-24 DIAGNOSIS — M542 Cervicalgia: Secondary | ICD-10-CM | POA: Diagnosis not present

## 2022-11-24 DIAGNOSIS — R2681 Unsteadiness on feet: Secondary | ICD-10-CM | POA: Diagnosis not present

## 2022-11-24 NOTE — Telephone Encounter (Signed)
Pt returning a call to Jasmine December would not elaborate as the nature of the call.

## 2022-11-24 NOTE — Telephone Encounter (Signed)
I called patient, patient was discharged from practice due to no shows/cancellations.
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
## 2022-11-25 DIAGNOSIS — L409 Psoriasis, unspecified: Secondary | ICD-10-CM | POA: Diagnosis not present

## 2022-11-25 DIAGNOSIS — L4050 Arthropathic psoriasis, unspecified: Secondary | ICD-10-CM | POA: Diagnosis not present

## 2022-12-02 DIAGNOSIS — I10 Essential (primary) hypertension: Secondary | ICD-10-CM | POA: Diagnosis not present

## 2022-12-02 DIAGNOSIS — R2689 Other abnormalities of gait and mobility: Secondary | ICD-10-CM | POA: Diagnosis not present

## 2022-12-02 DIAGNOSIS — Z8709 Personal history of other diseases of the respiratory system: Secondary | ICD-10-CM | POA: Diagnosis not present

## 2022-12-02 DIAGNOSIS — G43909 Migraine, unspecified, not intractable, without status migrainosus: Secondary | ICD-10-CM | POA: Diagnosis not present

## 2022-12-02 DIAGNOSIS — E039 Hypothyroidism, unspecified: Secondary | ICD-10-CM | POA: Diagnosis not present

## 2022-12-02 DIAGNOSIS — G629 Polyneuropathy, unspecified: Secondary | ICD-10-CM | POA: Diagnosis not present

## 2022-12-02 DIAGNOSIS — K219 Gastro-esophageal reflux disease without esophagitis: Secondary | ICD-10-CM | POA: Diagnosis not present

## 2022-12-02 DIAGNOSIS — R739 Hyperglycemia, unspecified: Secondary | ICD-10-CM | POA: Diagnosis not present

## 2022-12-02 DIAGNOSIS — M329 Systemic lupus erythematosus, unspecified: Secondary | ICD-10-CM | POA: Diagnosis not present

## 2022-12-02 DIAGNOSIS — E559 Vitamin D deficiency, unspecified: Secondary | ICD-10-CM | POA: Diagnosis not present

## 2022-12-02 DIAGNOSIS — E782 Mixed hyperlipidemia: Secondary | ICD-10-CM | POA: Diagnosis not present

## 2022-12-02 DIAGNOSIS — L4050 Arthropathic psoriasis, unspecified: Secondary | ICD-10-CM | POA: Diagnosis not present

## 2022-12-13 NOTE — Progress Notes (Unsigned)
Primary GI: Erick Blinks, MD  Assessment and Plan   Brief Narrative:  57 y.o.  female whose past medical history includes,  but is not necessarily limited to,  COPD, GERD with HH  Recurrent dysphagia to solids and water ( other liquids are no problem).  Evaluated for the same August 2022.  No esophageal findings on EGD, status post empiric dilation with good response up until a couple of months ago.  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  Wonder if this has progressed.  -Will arrange for EGD, possibly with empiric dilation since that did help the last time. The risks and benefits of EGD with possible biopsies were discussed with the patient who agrees to proceed.   Cystic lesions on left buttock since colonoscopy. Lesions drain "pus" at times. On exam there is a dime size firm lesion on left buttock adjacent to the top of the natal cleft. With palpation there was some clear drainage. Thought maybe a pilonidal cyst but not sure since the lesion is located on her buttock rather than the natal cleft itself -Will ask Dr. Rhea Belton to look at the area at time of EGD on 7/2. It is so small that surgical drainage seems unnecessary.     Chronic GERD without Barrett's esophagus / small HH.  Symptoms overall well-controlled on daily pantoprazole, occasionally needs a second dose for breakthrough symptoms -Discussed anti-reflux measures such as avoidance of late meals / bedtime snacks, HOB elevation (or use of wedge pillow) and avoidance of trigger foods and caffeine.  -Continue daily pantoprazole  Occasional constipation with straining.  -Glycerin suppositories as needed.   History of Present Illness   Chief complaint:  some breakthrough reflux at times, recurrent problems swallowing, draining bumps on buttocks since colonoscopy   Patient last seen July 2022 for anemia, dysphagia and weight loss. She underwent EGD and colonoscopy with no findings to explain symptoms.Esophagus was  dilated empirically.   Interval History:  Taking Pantoprazole once daily but occasionally has to take an extra which helps. Dysphagia resolved after empiric dilation but she began having problems swalowing solids again a couple of months ago. Also she tends to regurgitate water but other fluids go down okay.   Patient complains of a "bump" on buttock that comes and goes since colonoscopy. Never had this prior to colonoscopy . The bump sometimes drains "pus" like fluid   Previous GI Endoscopies / Labs / Imaging   Esophagram March 2016.  IMPRESSION: 1. There is no evidence of obstruction to passage of liquid barium nor of the barium tablet. 2. 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 There is no evidence of esophagitis. 3. There is a small non reducible hiatal hernia.   Aug 2022 EGD and colonoscopy for anemia, weight loss and dysphagia EGD - Normal mucosa was found in the entire esophagus. - 1 cm hiatal hernia. - No endoscopic esophageal abnormality to explain patient's dysphagia. Esophagus dilated with 52 Fr Maloney. - A few gastric polyps. Sampled by biopsy. - Biopsies from stomach were taken with a cold forceps for histology and Helicobacter pylori testing. Normal examined duodenum Colonoscopy - Diverticulosis in the sigmoid colon. - The examination was otherwise normal on direct and retroflexion views. - No specimens collected.  Diagnosis 1. Surgical [P], gastric antrum and gastric body - BENIGN GASTRIC MUCOSA WITH MILD REACTIVE CHANGES - NO H. PYLORI, INTESTINAL METAPLASIA OR MALIGNANCY IDENTIFIED 2. Surgical [P], gastric polyps - FUNDIC GLAND POLYP(S) - NO H. PYLORI, INTESTINAL METAPLASIA OR  MALIGNANCY IDENTIFIED  EF 55-60 % on Sept 2020 echo   Past Medical History:  Diagnosis Date   Allergy    Anxiety    Arthritis    ra, lupus - ankles, knees, hands, neck   Blood transfusion without reported diagnosis 52   [place] with c/s surgery   Bronchitis    Hx   Cancer  (HCC)    nasopharynx   COPD (chronic obstructive pulmonary disease) (HCC)    DDD (degenerative disc disease), lumbar    Diabetes mellitus without complication (HCC)    related to prednisone use, no meds,cbg normal   Diverticulosis    Excessive or frequent menstruation    GERD (gastroesophageal reflux disease)    Headache(784.0)    Hiatal hernia    Hypertension    Hypothyroidism    Irregular menstrual cycle    Leiomyoma of uterus, unspecified    Lupus (HCC)    Migraines    Neuromuscular disorder (HCC)    neuropathy in legs/feet   Neuropathy    Papanicolaou smear of cervix with atypical squamous cells of undetermined significance (ASC-US)    Psoriasis    Sinusitis    Trichimoniasis    Wears partial dentures    full upper and lower partial    Past Surgical History:  Procedure Laterality Date   ANKLE ARTHROSCOPY Right    CESAREAN SECTION     x 1   DILATION AND CURETTAGE OF UTERUS     x 1 Missed Abortion   ESOPHAGEAL DILATION     LYMPH NODE BIOPSY     neck   MULTIPLE EXTRACTIONS WITH ALVEOLOPLASTY N/A 03/05/2013   Procedure: MULTIPLE EXTRACION WITH ALVEOLOPLASTY, extraction of decayed teeth numbers 3,4,5,18,19,22,23,24,25, extraction of retained root tips teeth numbers 2,20,21,26, bilateral mandibular alveoloplasty and upper right maxillary alveoloplasty;  Surgeon: Francene Finders, DDS;  Location: [facility] OR;  Service: Oral Surgery;  Laterality: N/A;   scalp biopsy     TOOTH EXTRACTION Bilateral 03/05/2013   Procedure: EXTRACTION MOLARS;  Surgeon: Francene Finders, DDS;  Location: [facility] OR;  Service: Oral Surgery;  Laterality: Bilateral;    Current Medications, Allergies, Family History and Social History were reviewed in [employer] record.     Current Outpatient Medications  Medication Sig Dispense Refill   albuterol (PROVENTIL HFA;VENTOLIN HFA) 108 (90 BASE) MCG/ACT inhaler Inhale 2 puffs into the lungs every 6 (six) hours as needed for wheezing  or shortness of breath.      albuterol (PROVENTIL) (2.5 MG/3ML) 0.083% nebulizer solution Take 2.5 mg by nebulization every 6 (six) hours as needed for wheezing or shortness of breath. Reported on 10/27/2015     Ascorbic Acid (VITAMIN C) 1000 MG tablet Take 1,000 mg by mouth 2 (two) times daily.     Atogepant (QULIPTA) 60 MG TABS Take 1 tablet (60 mg total) by mouth daily. 30 tablet 6   calcium-vitamin D (OSCAL WITH D) 500-200 MG-UNIT per tablet Take 1 tablet by mouth 2 (two) times daily.     cetirizine (ZYRTEC) 10 MG tablet Take 10 mg by mouth daily.     COSENTYX 300 DOSE 150 MG/ML SOSY Inject 300 mg into the muscle every 28 (twenty-eight) days.      diclofenac (VOLTAREN) 75 MG EC tablet Take 1 tablet by mouth 2 (two) times daily. (Patient not taking: Reported on 09/14/2022)     furosemide (LASIX) 40 MG tablet Take 40 mg by mouth daily as needed for fluid or edema.  gabapentin (NEURONTIN) 100 MG capsule Take 100 mg by mouth 3 (three) times daily.      glucosamine-chondroitin 500-400 MG tablet Take 1 tablet by mouth 2 (two) times daily.     hydrOXYzine (ATARAX/VISTARIL) 10 MG tablet Take 10 mg by mouth at bedtime as needed for itching or anxiety. Reported on 10/27/2015     Iron-FA-B Cmp-C-Biot-Probiotic (FUSION PLUS) CAPS Take 1 capsule by mouth daily.     levothyroxine (SYNTHROID, LEVOTHROID) 75 MCG tablet Take 75 mcg by mouth daily.     metoprolol succinate (TOPROL-XL) 25 MG 24 hr tablet TAKE 1 TABLET(25 MG) BY MOUTH DAILYTAKE 1 TABLET(25 MG) BY MOUTH DAILY /  PLEASE CONTACT YOUR PRIMARY CARE PROVIDER FOR FUTURE REFILLS 30 tablet 0   mometasone (NASONEX) 50 MCG/ACT nasal spray Place 2 sprays into the nose daily.   5   Multiple Vitamin (MULTIVITAMIN WITH MINERALS) TABS Take 1 tablet by mouth daily.     nitroGLYCERIN (NITROSTAT) 0.4 MG SL tablet Place 1 tablet (0.4 mg total) under the tongue every 5 (five) minutes as needed for chest pain. 25 tablet 3   pantoprazole (PROTONIX) 40 MG tablet Take 40  mg by mouth daily.      potassium chloride (K-DUR) 10 MEQ tablet Take 1 tablet by mouth daily.     rizatriptan (MAXALT-MLT) 10 MG disintegrating tablet Take 10 mg by mouth as needed for migraine.   5   SYMBICORT 160-4.5 MCG/ACT inhaler Inhale 2 puffs into the lungs 2 (two) times daily.  5   topiramate (TOPAMAX) 100 MG tablet Take 100 mg by mouth at bedtime.      triamcinolone cream (KENALOG) 0.1 % Apply topically 2 (two) times daily as needed.     VITAMIN E PO Take 400 mg by mouth 2 (two) times daily.      No current facility-administered medications for this visit.    Review of Systems: No chest pain. No shortness of breath. No urinary complaints.    Physical Exam  Wt Readings from Last 3 Encounters:  09/14/22 144 lb 9.6 oz (65.6 kg)  02/04/21 158 lb (71.7 kg)  12/31/20 158 lb (71.7 kg)    LMP 06/25/2016  BP 138/ 88, HR 58 Constitutional:  Pleasant, generally well appearing female in no acute distress. Psychiatric: Normal mood and affect. Behavior is normal. EENT: Pupils normal.  Conjunctivae are normal. No scleral icterus. Neck supple.  Cardiovascular: Normal rate, regular rhythm.  Pulmonary/chest: Effort normal and breath sounds normal. No wheezing, rales or rhonchi. Abdominal: Soft, nondistended, nontender. Bowel sounds active throughout. There are no masses palpable. No hepatomegaly. Neurological: Alert and oriented to person place and time.  Extremities: no edema  Willette Cluster, NP  12/13/2022, 9:22 PM  Cc:  Norm Salt, PA

## 2022-12-14 ENCOUNTER — Encounter: Payer: Self-pay | Admitting: Nurse Practitioner

## 2022-12-14 ENCOUNTER — Ambulatory Visit (INDEPENDENT_AMBULATORY_CARE_PROVIDER_SITE_OTHER): Payer: 59 | Admitting: Nurse Practitioner

## 2022-12-14 DIAGNOSIS — R1319 Other dysphagia: Secondary | ICD-10-CM

## 2022-12-14 DIAGNOSIS — L0591 Pilonidal cyst without abscess: Secondary | ICD-10-CM | POA: Diagnosis not present

## 2022-12-14 DIAGNOSIS — K219 Gastro-esophageal reflux disease without esophagitis: Secondary | ICD-10-CM

## 2022-12-14 NOTE — Patient Instructions (Addendum)
_______________________________________________________  If your blood pressure at your visit was 140/90 or greater, please contact your primary care physician to follow up on this.  If you are age 58 or younger, your body mass index should be between 19-25. Your Body mass index is 24.86 kg/m. If this is out of the aformentioned range listed, please consider follow up with your Primary Care Provider.  ________________________________________________________  The [facility] providers would like to encourage you to use [facility] to communicate with providers for non-urgent requests or questions.  Due to long hold times on the telephone, sending your provider a message by [facility] may be a faster and more efficient way to get a response.  Please allow 48 business hours for a response.  Please remember that this is for non-urgent requests.  _______________________________________________________  Bonita Quin can use Glycerin suppositories as needed.   You have been scheduled for an endoscopy. Please follow written instructions given to you at your visit today. If you use inhalers (even only as needed), please bring them with you on the day of your procedure.  Due to recent changes in healthcare laws, you may see the results of your imaging and laboratory studies on MyChart before your provider has had a chance to review them.  We understand that in some cases there may be results that are confusing or concerning to you. Not all laboratory results come back in the same time frame and the provider may be waiting for multiple results in order to interpret others.  Please give [place] 48 hours in order for your provider to thoroughly review all the results before contacting the office for clarification of your results.   Thank you for entrusting me with your care and choosing [facility]  Gunnar Fusi, NP

## 2022-12-16 NOTE — Progress Notes (Signed)
Addendum: Reviewed and agree with assessment and management plan. Ceili Boshers M, MD  

## 2022-12-21 ENCOUNTER — Ambulatory Visit: Payer: 59 | Admitting: Internal Medicine

## 2022-12-21 ENCOUNTER — Encounter: Payer: Self-pay | Admitting: Internal Medicine

## 2022-12-21 ENCOUNTER — Telehealth: Payer: Self-pay

## 2022-12-21 ENCOUNTER — Other Ambulatory Visit: Payer: Self-pay

## 2022-12-21 VITALS — BP 167/93 | HR 85 | Temp 98.7°F | Resp 11 | Ht 66.0 in | Wt 154.0 lb

## 2022-12-21 DIAGNOSIS — R1319 Other dysphagia: Secondary | ICD-10-CM

## 2022-12-21 DIAGNOSIS — K219 Gastro-esophageal reflux disease without esophagitis: Secondary | ICD-10-CM

## 2022-12-21 DIAGNOSIS — L0591 Pilonidal cyst without abscess: Secondary | ICD-10-CM

## 2022-12-21 DIAGNOSIS — R1310 Dysphagia, unspecified: Secondary | ICD-10-CM | POA: Diagnosis not present

## 2022-12-21 HISTORY — PX: ESOPHAGOGASTRODUODENOSCOPY (EGD) WITH ESOPHAGEAL DILATION: SHX5812

## 2022-12-21 MED ORDER — SODIUM CHLORIDE 0.9 % IV SOLN
500.0000 mL | Freq: Once | INTRAVENOUS | Status: DC
Start: 2022-12-21 — End: 2023-07-12

## 2022-12-21 NOTE — Telephone Encounter (Signed)
-----   Message from Beverley Fiedler, MD sent at 12/21/2022  2:21 PM EDT ----- Patient has a cystic lesion, intermittently draining, inferior to the coccyx at the top of the intergluteal cleft Please have her perform an ultrasound to further evaluate Thanks JMP

## 2022-12-21 NOTE — Patient Instructions (Addendum)
YOU HAD AN ENDOSCOPIC PROCEDURE TODAY AT THE [facility]:   Refer to the procedure report that was given to you for any specific questions about what was found during the examination.  If the procedure report does not answer your questions, please call your gastroenterologist to clarify.  If you requested that your care partner not be given the details of your procedure findings, then the procedure report has been included in a sealed envelope for you to review at your convenience later.  YOU SHOULD EXPECT: Some feelings of bloating in the abdomen. Passage of more gas than usual.  Walking can help get rid of the air that was put into your GI tract during the procedure and reduce the bloating. If you had a lower endoscopy (such as a colonoscopy or flexible sigmoidoscopy) you may notice spotting of blood in your stool or on the toilet paper. If you underwent a bowel prep for your procedure, you may not have a normal bowel movement for a few days.  Please Note:  You might notice some irritation and congestion in your nose or some drainage.  This is from the oxygen used during your procedure.  There is no need for concern and it should clear up in a day or so.  SYMPTOMS TO REPORT IMMEDIATELY:   Following upper endoscopy (EGD)  Vomiting of blood or coffee ground material  New chest pain or pain under the shoulder blades  Painful or persistently difficult swallowing  New shortness of breath  Fever of 100F or higher  Black, tarry-looking stools  For urgent or emergent issues, a gastroenterologist can be reached at any hour by calling [phone]. Do not use MyChart messaging for urgent concerns.   Diet: POST DILATION DIET (See handout given to you by your recovery nurse): NOTHING TO EAT OR DRINK  until 3:30pm, then CLEAR LIQUIDS ONLY from 3:30pm to 4:30pm, then a SOFT DIET for the remainder of today starting at 4:30pm. You may then proceed to your regular diet as tolerated tomorrow  morning.  Drink plenty of fluids but you should avoid alcoholic beverages for 24 hours.  MEDICATIONS: Continue present medications.  FOLLOW UP: If dysphagia symptoms persist, the esophageal motility testing is warranted.  Please see handouts given to you by your recovery nurse: Post dilation diet, Hiatal hernia.  Thank you for allowing [place] to provide for your healthcare needs today.  ACTIVITY:  You should plan to take it easy for the rest of today and you should NOT DRIVE or use heavy machinery until tomorrow (because of the sedation medicines used during the test).    FOLLOW UP: Our staff will call the number listed on your records the next business day following your procedure.  We will call around 7:15- 8:00 am to check on you and address any questions or concerns that you may have regarding the information given to you following your procedure. If we do not reach you, we will leave a message.     If any biopsies were taken you will be contacted by phone or by letter within the next 1-3 weeks.  Please call us at [phone] if you have not heard about the biopsies in 3 weeks.    SIGNATURES/CONFIDENTIALITY: You and/or your care partner have signed paperwork which will be entered into your electronic medical record.  These signatures attest to the fact that that the information above on your After Visit Summary has been reviewed and is understood.  Full responsibility of the  confidentiality of this discharge information lies with you and/or your care-partner. 

## 2022-12-21 NOTE — Progress Notes (Signed)
Called to room to assist during endoscopic procedure.  Patient ID and intended procedure confirmed with present staff. Received instructions for my participation in the procedure from the performing physician.  

## 2022-12-21 NOTE — Progress Notes (Signed)
Patient in recovery post upper endoscopy, recovery unremarkable with the exception of elevated blood pressure, 167/97 in admission prior to procedure, elevated throughout procedure, 164/100, 173/94, and 167/93 in recovery, CRNA and MD notified and has seen patient. Patient states her blood pressure has been running high on her current BP medication. Advised patient to follow up with her prescribing doctor to be reevaluated and to check her BP at home and keep a log of the results to bring to her appointment. Patient verbalized understanding.

## 2022-12-21 NOTE — Progress Notes (Signed)
Please see office note dated 12/14/2022 for details in current H&P  Patient presenting for upper endoscopy to evaluate recurrent dysphagia symptoms.  EGD with dilation performed in August 2022 with good response.  She remains appropriate for [facility] upper endoscopy today

## 2022-12-21 NOTE — Op Note (Signed)
[facility] Patient Name: [name] Procedure Date: 12/21/2022 1:50 PM MRN: [id number] Endoscopist: Beverley Fiedler , MD, [id number] Age: 57 Referring MD:  Date of Birth: 15-Jul-1965 Gender: Female Account #: [id number] Procedure:                Upper GI endoscopy Indications:              Dysphagia responsive to dilation in 2022 Medicines:                Monitored Anesthesia Care Procedure:                Pre-Anesthesia Assessment:                           - Prior to the procedure, a History and Physical                            was performed, and patient medications and                            allergies were reviewed. The patient's tolerance of                            previous anesthesia was also reviewed. The risks                            and benefits of the procedure and the sedation                            options and risks were discussed with the patient.                            All questions were answered, and informed consent                            was obtained. Prior Anticoagulants: The patient has                            taken no anticoagulant or antiplatelet agents. ASA                            Grade Assessment: III - A patient with severe                            systemic disease. After reviewing the risks and                            benefits, the patient was deemed in satisfactory                            condition to undergo the procedure.                           After obtaining informed consent, the endoscope was  passed under direct vision. Throughout the                            procedure, the patient's blood pressure, pulse, and                            oxygen saturations were monitored continuously. The                            Olympus Scope [id number] was introduced through the                            mouth, and advanced to the third part of duodenum.                            The upper  GI endoscopy was accomplished without                            difficulty. The patient tolerated the procedure                            well. Scope In: Scope Out: Findings:                 No endoscopic abnormality was evident in the                            esophagus to explain the patient's complaint of                            dysphagia. It was decided, however, to proceed with                            dilation of the entire esophagus. The scope was                            withdrawn. Dilation was performed with a Maloney                            dilator with mild resistance at 52 Fr and 54 Fr.                           A 1 cm hiatal hernia was present.                           The entire examined stomach was normal.                           The examined duodenum was normal. Complications:            No immediate complications. Estimated Blood Loss:     Estimated blood loss: none. Impression:               - No endoscopic esophageal abnormality to explain  patient's dysphagia. Esophagus dilated with 52 and                            then 54 Fr Maloney.                           - 1 cm hiatal hernia.                           - Normal stomach.                           - Normal examined duodenum.                           - No specimens collected. Recommendation:           - Patient has a contact number available for                            emergencies. The signs and symptoms of potential                            delayed complications were discussed with the                            patient. Return to normal activities tomorrow.                            Written discharge instructions were provided to the                            patient.                           - Advance diet as tolerated.                           - Continue present medications.                           - If dysphagia symptoms persistent the esophageal                             motility testing is warranted. Beverley Fiedler, MD 12/21/2022 2:20:03 PM This report has been signed electronically.

## 2022-12-21 NOTE — Telephone Encounter (Signed)
Ultrasound ordered, Rad scheduling to contact pt regarding appt.,

## 2022-12-21 NOTE — Progress Notes (Signed)
A and O x3. Report to RN. Tolerated MAC anesthesia well.Teeth unchanged after procedure. 

## 2022-12-22 ENCOUNTER — Other Ambulatory Visit: Payer: Self-pay

## 2022-12-22 ENCOUNTER — Telehealth: Payer: Self-pay | Admitting: *Deleted

## 2022-12-22 DIAGNOSIS — L0591 Pilonidal cyst without abscess: Secondary | ICD-10-CM

## 2022-12-22 NOTE — Telephone Encounter (Signed)
  Follow up Call-     12/21/2022    1:13 PM 02/04/2021    7:31 AM  Call back number  Post procedure Call Back phone  # [phone]  Permission to leave phone message Yes Yes     Patient questions:  Do you have a fever, pain , or abdominal swelling? No. Pain Score  0 *  Have you tolerated food without any problems? Yes.    Have you been able to return to your normal activities? Yes.    Do you have any questions about your discharge instructions: Diet   No. Medications  No. Follow up visit  No.  Do you have questions or concerns about your Care? No.  Actions: * If pain score is 4 or above: No action needed, pain <4.

## 2022-12-27 ENCOUNTER — Ambulatory Visit (HOSPITAL_COMMUNITY)
Admission: RE | Admit: 2022-12-27 | Discharge: 2022-12-27 | Disposition: A | Discharge status: Home / Self Care | Payer: 59 | Source: Ambulatory Visit | Attending: Internal Medicine | Admitting: Internal Medicine

## 2022-12-27 DIAGNOSIS — L0591 Pilonidal cyst without abscess: Secondary | ICD-10-CM | POA: Diagnosis not present

## 2022-12-27 DIAGNOSIS — R222 Localized swelling, mass and lump, trunk: Secondary | ICD-10-CM | POA: Diagnosis not present

## 2023-01-11 DIAGNOSIS — M329 Systemic lupus erythematosus, unspecified: Secondary | ICD-10-CM | POA: Diagnosis not present

## 2023-01-11 DIAGNOSIS — G629 Polyneuropathy, unspecified: Secondary | ICD-10-CM | POA: Diagnosis not present

## 2023-01-11 DIAGNOSIS — I10 Essential (primary) hypertension: Secondary | ICD-10-CM | POA: Diagnosis not present

## 2023-01-11 DIAGNOSIS — L4050 Arthropathic psoriasis, unspecified: Secondary | ICD-10-CM | POA: Diagnosis not present

## 2023-01-11 DIAGNOSIS — Z8709 Personal history of other diseases of the respiratory system: Secondary | ICD-10-CM | POA: Diagnosis not present

## 2023-01-11 DIAGNOSIS — E782 Mixed hyperlipidemia: Secondary | ICD-10-CM | POA: Diagnosis not present

## 2023-01-11 DIAGNOSIS — L259 Unspecified contact dermatitis, unspecified cause: Secondary | ICD-10-CM | POA: Diagnosis not present

## 2023-01-11 DIAGNOSIS — R2689 Other abnormalities of gait and mobility: Secondary | ICD-10-CM | POA: Diagnosis not present

## 2023-01-11 DIAGNOSIS — E039 Hypothyroidism, unspecified: Secondary | ICD-10-CM | POA: Diagnosis not present

## 2023-01-11 DIAGNOSIS — K219 Gastro-esophageal reflux disease without esophagitis: Secondary | ICD-10-CM | POA: Diagnosis not present

## 2023-01-11 DIAGNOSIS — G43909 Migraine, unspecified, not intractable, without status migrainosus: Secondary | ICD-10-CM | POA: Diagnosis not present

## 2023-01-11 DIAGNOSIS — E559 Vitamin D deficiency, unspecified: Secondary | ICD-10-CM | POA: Diagnosis not present

## 2023-01-12 ENCOUNTER — Other Ambulatory Visit: Payer: Self-pay | Admitting: Physician Assistant

## 2023-01-12 DIAGNOSIS — Z1231 Encounter for screening mammogram for malignant neoplasm of breast: Secondary | ICD-10-CM

## 2023-01-14 DIAGNOSIS — M25511 Pain in right shoulder: Secondary | ICD-10-CM | POA: Diagnosis not present

## 2023-01-20 DIAGNOSIS — L4050 Arthropathic psoriasis, unspecified: Secondary | ICD-10-CM | POA: Diagnosis not present

## 2023-01-20 DIAGNOSIS — L409 Psoriasis, unspecified: Secondary | ICD-10-CM | POA: Diagnosis not present

## 2023-01-21 DIAGNOSIS — L409 Psoriasis, unspecified: Secondary | ICD-10-CM | POA: Diagnosis not present

## 2023-01-21 DIAGNOSIS — H6123 Impacted cerumen, bilateral: Secondary | ICD-10-CM | POA: Insufficient documentation

## 2023-01-21 DIAGNOSIS — H9201 Otalgia, right ear: Secondary | ICD-10-CM | POA: Insufficient documentation

## 2023-01-25 ENCOUNTER — Other Ambulatory Visit: Payer: Self-pay | Admitting: Physician Assistant

## 2023-01-25 DIAGNOSIS — Z1231 Encounter for screening mammogram for malignant neoplasm of breast: Secondary | ICD-10-CM

## 2023-01-26 DIAGNOSIS — K603 Anal fistula: Secondary | ICD-10-CM | POA: Diagnosis not present

## 2023-02-10 ENCOUNTER — Telehealth: Payer: Self-pay | Admitting: Psychiatry

## 2023-02-10 DIAGNOSIS — M542 Cervicalgia: Secondary | ICD-10-CM

## 2023-02-10 NOTE — Telephone Encounter (Signed)
Pt said 2 weeks ago started popping and cracking since being release from theapy, having neck pain. Neck pain is on and off, taking Tylenol or muscle relaxer or heating pad.  Temporary relieve the pain. Would like a call back.

## 2023-02-10 NOTE — Telephone Encounter (Signed)
I called patient to discuss the below. Pt said she finished PT and has been released for about a month and a half. Pt said the popping and cracking in her neck is back, reports symptoms was  better, not fully gone, but better. She asked if a referral could be placed for a chiropractor? She doesn't think PT is needed anymore. She also reports the flexeril is not helping, she asked if Voltaren pills can be sent in? She has taken this in past and it worked well. Pt aware you are out of office today and is fine with waiting for response when you return. Pt is using [facility] pharmacy list in chart.   Please advise

## 2023-02-15 MED ORDER — DICLOFENAC SODIUM 75 MG PO TBEC: 75.0000 mg | DELAYED_RELEASE_TABLET | Freq: Two times a day (BID) | ORAL | 3 refills | Status: AC | PRN

## 2023-02-15 NOTE — Telephone Encounter (Signed)
Patient informed. 

## 2023-02-15 NOTE — Telephone Encounter (Signed)
Referral to chiropractor placed. I sent an rx for Voltaren for her as well

## 2023-02-16 DIAGNOSIS — M25511 Pain in right shoulder: Secondary | ICD-10-CM | POA: Diagnosis not present

## 2023-02-17 ENCOUNTER — Telehealth: Payer: Self-pay | Admitting: Psychiatry

## 2023-02-17 NOTE — Telephone Encounter (Signed)
Referral for chiropractic fax to [facility]. Phone: [phone], Fax: [phone]

## 2023-02-23 DIAGNOSIS — M25511 Pain in right shoulder: Secondary | ICD-10-CM | POA: Diagnosis not present

## 2023-03-09 NOTE — Progress Notes (Deleted)
Referring:  Norm Salt, PA [address] [place],  [place] [address]  PCP: Norm Salt, PA  Neurology was asked to evaluate [name], a 57 year old female for a chief complaint of headaches.  Our recommendations of care will be communicated by shared medical record.    CC:  headaches  History provided from self, sister  Follow-up visit:  Prior visit: 08/25/2022   Brief HPI:  [name] is a 57 y.o. female with PMH of SLE, hypothyroidism, psoriasis, kidney stones, asthma, nasopharyngeal cancer s/p radiation who returns for follow-up visit regarding chronic migraines with worsening around 06/2022 post fall.   At prior visit, recommended completion of MRI brain and initiated Qulipta for preventative.  Continued on topiramate and rizatriptan which she was using previously.  Referred to PT for cervicalgia.   Interval history:  Returns for follow-up visit.    MRI brain unremarkable.       The patient presents for evaluation of headaches. She has had migraines for several years, but they worsened 2 months ago after 2 falls. First time she tripped and fell on some weights and hit the right side of her head. The second time she took tizanidine and got dizzy, then fell and hit the left side of her head. Headaches began 1.5 weeks later. She is currently having headaches daily. They are getting worse over time. Headaches are associated with photophobia, phonophobia, nausea, and neck pain. They can last for 2-3 hours at a time.   She currently takes Maxalt as needed which does help for rescue. Has been taking Topamax for migraine prevention for several years. She does have a history of kidney stones.  Headache History: Onset: 2 months ago Aura: blurry vision Location: forehead, occiput Quality/Description: pressure Associated Symptoms:  Photophobia: yes  Phonophobia: yes  Nausea: yes Worse with activity?: yes Duration of headaches: 2-3  hours  Migraine days per month: 30 Headache free days per month: 0  Current Treatment: Abortive Maxalt 10 mg PRN  Preventative Topamax 100 mg PRN  Prior Therapies                                 Rescue: Maxalt 10 mg PRN  Prevention: Topamax 100 mg QHS amitriptyline Cymbalta 30 mg daily Metoprolol 25 mg daily   LABS: CBC    Component Value Date/Time   WBC 7.5 12/31/2020 1542   RBC 3.85 (L) 12/31/2020 1542   HGB 11.2 (L) 12/31/2020 1542   HGB 12.4 10/14/2016 1151   HGB 10.6 (L) 03/25/2009 1245   HCT 34.4 (L) 12/31/2020 1542   HCT 38.7 10/14/2016 1151   HCT 30.9 (L) 03/25/2009 1245   PLT 290.0 12/31/2020 1542   PLT 331 10/14/2016 1151   MCV 89.4 12/31/2020 1542   MCV 93 10/14/2016 1151   MCV 93.1 03/25/2009 1245   MCH 25.7 (L) 11/19/2020 0000   MCHC 32.5 12/31/2020 1542   RDW 20.3 (H) 12/31/2020 1542   RDW 14.0 10/14/2016 1151   RDW 15.8 (H) 03/25/2009 1245   LYMPHSABS 1.4 12/31/2020 1542   LYMPHSABS 2.0 10/14/2016 1151   LYMPHSABS 1.6 03/25/2009 1245   MONOABS 0.4 12/31/2020 1542   MONOABS 0.5 03/25/2009 1245   EOSABS 0.1 12/31/2020 1542   EOSABS 0.1 10/14/2016 1151   BASOSABS 0.1 12/31/2020 1542   BASOSABS 0.0 10/14/2016 1151   BASOSABS 0.0 03/25/2009 1245      Latest Ref  Rng & Units 11/19/2020   12:00 AM 11/21/2019    2:50 PM 07/19/2019    2:05 PM  CMP  Glucose 65 - 99 mg/dL 98  78  88   BUN 7 - 25 mg/dL 13  15  14    Creatinine 0.50 - 1.05 mg/dL 1.19  1.47  8.29   Sodium 135 - 146 mmol/L 143  139  143   Potassium 3.5 - 5.3 mmol/L 3.9  4.0  3.9   Chloride 98 - 110 mmol/L 106  104  107   CO2 20 - 32 mmol/L 22  26  28    Calcium 8.6 - 10.4 mg/dL 9.4  9.3  9.3   Total Protein 6.1 - 8.1 g/dL 7.7  7.4  7.3   Total Bilirubin 0.2 - 1.2 mg/dL 0.3  0.3  0.3   AST 10 - 35 U/L 18  15  14    ALT 6 - 29 U/L 10  8  8       IMAGING:  [facility] 01/17/2019: unremarkable  Imaging independently reviewed on March 09, 2023   Current Outpatient Medications on File  Prior to Visit  Medication Sig Dispense Refill   albuterol (PROVENTIL HFA;VENTOLIN HFA) 108 (90 BASE) MCG/ACT inhaler Inhale 2 puffs into the lungs every 6 (six) hours as needed for wheezing or shortness of breath.      albuterol (PROVENTIL) (2.5 MG/3ML) 0.083% nebulizer solution Take 2.5 mg by nebulization every 6 (six) hours as needed for wheezing or shortness of breath. Reported on 10/27/2015     Ascorbic Acid (VITAMIN C) 1000 MG tablet Take 1,000 mg by mouth 2 (two) times daily.     Atogepant (QULIPTA) 60 MG TABS Take 1 tablet (60 mg total) by mouth daily. (Patient not taking: Reported on 12/21/2022) 30 tablet 6   calcium-vitamin D (OSCAL WITH D) 500-200 MG-UNIT per tablet Take 1 tablet by mouth 2 (two) times daily.     cetirizine (ZYRTEC) 10 MG tablet Take 10 mg by mouth daily.     COSENTYX 300 DOSE 150 MG/ML SOSY Inject 300 mg into the muscle every 28 (twenty-eight) days.      cyclobenzaprine (FLEXERIL) 10 MG tablet Take 10 mg by mouth daily as needed.     diclofenac (VOLTAREN) 75 MG EC tablet Take 1 tablet (75 mg total) by mouth 2 (two) times daily as needed (for neck pain and headaches). 30 tablet 3   ferrous sulfate 325 (65 FE) MG tablet Take 325 mg by mouth daily.     furosemide (LASIX) 40 MG tablet Take 40 mg by mouth daily as needed for fluid or edema.      gabapentin (NEURONTIN) 100 MG capsule Take 100 mg by mouth 3 (three) times daily.      glucosamine-chondroitin 500-400 MG tablet Take 1 tablet by mouth 2 (two) times daily.     hydrOXYzine (ATARAX/VISTARIL) 10 MG tablet Take 10 mg by mouth at bedtime as needed for itching or anxiety. Reported on 10/27/2015     levothyroxine (SYNTHROID, LEVOTHROID) 75 MCG tablet Take 75 mcg by mouth daily.     metoprolol succinate (TOPROL-XL) 50 MG 24 hr tablet Take 1 tablet by mouth daily.     mometasone (NASONEX) 50 MCG/ACT nasal spray Place 2 sprays into the nose daily.  (Patient not taking: Reported on 12/21/2022)  5   Multiple Vitamin (MULTIVITAMIN WITH  MINERALS) TABS Take 1 tablet by mouth daily.     nitroGLYCERIN (NITROSTAT) 0.4 MG SL tablet Place 1 tablet (  0.4 mg total) under the tongue every 5 (five) minutes as needed for chest pain. 25 tablet 3   pantoprazole (PROTONIX) 40 MG tablet Take 40 mg by mouth daily.      potassium chloride (K-DUR) 10 MEQ tablet Take 1 tablet by mouth daily.     rizatriptan (MAXALT-MLT) 10 MG disintegrating tablet Take 10 mg by mouth as needed for migraine.   5   SYMBICORT 160-4.5 MCG/ACT inhaler Inhale 2 puffs into the lungs 2 (two) times daily.  5   topiramate (TOPAMAX) 100 MG tablet Take 100 mg by mouth at bedtime.      triamcinolone cream (KENALOG) 0.1 % Apply topically 2 (two) times daily as needed.     VITAMIN E PO Take 400 mg by mouth 2 (two) times daily.      Current Facility-Administered Medications on File Prior to Visit  Medication Dose Route Frequency Provider Last Rate Last Admin   0.9 %  sodium chloride infusion  500 mL Intravenous Once Pyrtle, Carie Caddy, MD         Allergies: Allergies  Allergen Reactions   Amoxicillin Itching    Did it involve swelling of the face/tongue/throat, SOB, or low BP? No Did it involve sudden or severe rash/hives, skin peeling, or any reaction on the inside of your mouth or nose? Yes Did you need to seek medical attention at a hospital or doctor's office? Yes When did it last happen?      14 yrs ago If all above answers are "NO", may proceed with cephalosporin use.    Latex Itching   Percocet [Oxycodone-Acetaminophen] Itching   Plaquenil [Hydroxychloroquine Sulfate] Other (See Comments)    "Psoriasis."   Bactrim [Sulfamethoxazole-Trimethoprim] Rash   Ciprofloxacin Hcl Rash   Keflex [Cephalexin] Rash   Penicillins Rash    Did it involve swelling of the face/tongue/throat, SOB, or low BP? No Did it involve sudden or severe rash/hives, skin peeling, or any reaction on the inside of your mouth or nose? Yes Did you need to seek medical attention at a hospital or  doctor's office? Yes When did it last happen?      14 yrs ago If all above answers are "NO", may proceed with cephalosporin use.    Tetracyclines & Related Rash    Family History: Family History  Problem Relation Age of Onset   Hypertension Mother    Diabetes Mother    Hypertension Father    Lupus Brother    Breast cancer Maternal Grandmother 79   Colon cancer Neg Hx    Esophageal cancer Neg Hx    Rectal cancer Neg Hx    Stomach cancer Neg Hx    Colon polyps Neg Hx      Past Medical History: Past Medical History:  Diagnosis Date   Allergy    Anxiety    Arthritis    ra, lupus - ankles, knees, hands, neck   Blood transfusion without reported diagnosis 20   [place] with c/s surgery   Bronchitis    Hx   Cancer (HCC)    nasopharynx   COPD (chronic obstructive pulmonary disease) (HCC)    DDD (degenerative disc disease), lumbar    Diabetes mellitus without complication (HCC)    related to prednisone use, no meds,cbg normal   Diverticulosis    Excessive or frequent menstruation    GERD (gastroesophageal reflux disease)    Headache(784.0)    Hiatal hernia    Hypertension    Hypothyroidism    Irregular  menstrual cycle    Leiomyoma of uterus, unspecified    Lupus (HCC)    Migraines    Neuromuscular disorder (HCC)    neuropathy in legs/feet   Neuropathy    Papanicolaou smear of cervix with atypical squamous cells of undetermined significance (ASC-US)    Psoriasis    Sinusitis    Trichimoniasis    Wears partial dentures    full upper and lower partial    Past Surgical History Past Surgical History:  Procedure Laterality Date   ANKLE ARTHROSCOPY Right    CESAREAN SECTION     x 1   DILATION AND CURETTAGE OF UTERUS     x 1 Missed Abortion   ESOPHAGEAL DILATION     LYMPH NODE BIOPSY     neck   MULTIPLE EXTRACTIONS WITH ALVEOLOPLASTY N/A 03/05/2013   Procedure: MULTIPLE EXTRACION WITH ALVEOLOPLASTY, extraction of decayed teeth numbers 3,4,5,18,19,22,23,24,25,  extraction of retained root tips teeth numbers 2,20,21,26, bilateral mandibular alveoloplasty and upper right maxillary alveoloplasty;  Surgeon: Francene Finders, DDS;  Location: [facility] OR;  Service: Oral Surgery;  Laterality: N/A;   scalp biopsy     TOOTH EXTRACTION Bilateral 03/05/2013   Procedure: EXTRACTION MOLARS;  Surgeon: Francene Finders, DDS;  Location: [facility] OR;  Service: Oral Surgery;  Laterality: Bilateral;    Social History: Social History   Tobacco Use   Smoking status: Former    Current packs/day: 0.00    Average packs/day: 0.3 packs/day for 8.0 years (2.0 ttl pk-yrs)    Types: Cigarettes    Start date: 02/28/1996    Quit date: 02/28/2004    Years since quitting: 19.0   Smokeless tobacco: Never  Vaping Use   Vaping status: Never Used  Substance Use Topics   Alcohol use: Not Currently    Alcohol/week: 0.0 standard drinks of alcohol   Drug use: No    ROS: Negative for fevers, chills. Positive for headaches. All other systems reviewed and negative unless stated otherwise in HPI.   Physical Exam:   Vital Signs: LMP 06/25/2016  GENERAL: well appearing,in no acute distress,alert SKIN:  Color, texture, turgor normal. No rashes or lesions HEAD:  Normocephalic/atraumatic. CV:  RRR RESP: Normal respiratory effort MSK: +tenderness to palpation over L>R occiput, neck, and shoulders  NEUROLOGICAL: Mental Status: Alert, oriented to person, place and time,Follows commands Cranial Nerves: PERRL, visual fields intact to confrontation, extraocular movements intact, facial sensation intact, no facial droop or ptosis, hearing grossly intact, no dysarthria Motor: muscle strength 5/5 both upper and lower extremities Reflexes: 2+ throughout Sensation: intact to light touch all 4 extremities Coordination: Finger-to- nose-finger intact bilaterally Gait: normal-based   IMPRESSION: 57 year old female with a history of SLE, hypothyroidism, psoriasis, kidney stones, asthma,  nasopharyngeal cancer s/p radiation who presents for follow up of worsening headaches since 06/2022. She did have 2 falls in that time and has not sought evaluation before now. MR brain 08/2022 normal. Was started on Qulipta at prior visit and advised continuation of topiramate with possibly weaning off if migraines improve especially with history of kidney stones.  Referred to PT for cervicalgia ***   Will order brain MRI given continued worsening of headaches and history of head/neck cancer s/p radiation. She continues to have daily headaches despite Topamax treatment. Prefers to avoid injections if possible. Will start Qulipta for migraine prevention. Ideally would be able to wean off of Topamax if headaches stabilize as she does have a history of kidney stones. Will continue Maxalt for rescue.  Referral to PT placed for neck pain.  PLAN: -MRI brain -Prevention: Start Qulipta 60 mg daily, continue Topamax 100 mg QHS for now -Rescue: Continue Maxalt 10 mg PRN, continue Voltaren as needed  -Next steps: consider CGRP (she is OK with a pen injector if needed)      I spent *** minutes of face-to-face and non-face-to-face time with patient.  This included previsit chart review, lab review, study review, order entry, electronic health record documentation, patient education and discussion regarding above diagnoses and treatment plan and answered all other questions to patient's satisfaction  Ihor Austin, [facility] [address] [place], [place] [address]  Phone [phone] Fax [phone] Note: This document was prepared with digital dictation and possible smart phrase technology. Any transcriptional errors that result from this process are unintentional.

## 2023-03-10 ENCOUNTER — Ambulatory Visit: Payer: 59 | Admitting: Adult Health

## 2023-03-10 ENCOUNTER — Encounter: Payer: Self-pay | Admitting: Adult Health

## 2023-03-10 NOTE — Telephone Encounter (Signed)
Pt called to inquire about the status of the referral, pt was given name and # to [facility] .  This is FYI no call back requested

## 2023-03-11 ENCOUNTER — Encounter: Payer: Self-pay | Admitting: Obstetrics and Gynecology

## 2023-03-11 ENCOUNTER — Other Ambulatory Visit (HOSPITAL_COMMUNITY)
Admission: RE | Admit: 2023-03-11 | Discharge: 2023-03-11 | Disposition: A | Discharge status: Home / Self Care | Payer: 59 | Source: Ambulatory Visit | Attending: Obstetrics and Gynecology | Admitting: Obstetrics and Gynecology

## 2023-03-11 ENCOUNTER — Ambulatory Visit (INDEPENDENT_AMBULATORY_CARE_PROVIDER_SITE_OTHER): Payer: 59 | Admitting: Obstetrics and Gynecology

## 2023-03-11 VITALS — BP 137/84 | HR 88 | Wt 163.8 lb

## 2023-03-11 DIAGNOSIS — Z01419 Encounter for gynecological examination (general) (routine) without abnormal findings: Secondary | ICD-10-CM | POA: Diagnosis present

## 2023-03-11 DIAGNOSIS — Z202 Contact with and (suspected) exposure to infections with a predominantly sexual mode of transmission: Secondary | ICD-10-CM

## 2023-03-11 DIAGNOSIS — Z1151 Encounter for screening for human papillomavirus (HPV): Secondary | ICD-10-CM | POA: Diagnosis not present

## 2023-03-11 DIAGNOSIS — K08109 Complete loss of teeth, unspecified cause, unspecified class: Secondary | ICD-10-CM | POA: Insufficient documentation

## 2023-03-11 HISTORY — DX: Contact with and (suspected) exposure to infections with a predominantly sexual mode of transmission: Z20.2

## 2023-03-11 NOTE — Progress Notes (Signed)
[name] is a 57 y.o. [phone] female here for a routine annual gynecologic exam.  Current complaints: genital irritation. Has noted for 2 weeks now. Took a Diflucan. Thinks related to a new soap, she tried. Not sexual active.   Denies abnormal vaginal bleeding, discharge, pelvic pain, problems with intercourse or other gynecologic concerns.    Gynecologic History Patient's last menstrual period was 06/25/2016. Contraception: post menopausal status Last Pap: 2021. Results were: normal Last mammogram: 2020. Results were: normal  Obstetric History OB History  Gravida Para Term Preterm AB Living  2 1   1 1 1   SAB IAB Ectopic Multiple Live Births  1       1    # Outcome Date GA Lbr Len/2nd Weight Sex Type Anes PTL Lv  2 Preterm  [redacted]w[redacted]d       LIV  1 SAB  [redacted]w[redacted]d       DEC    Past Medical History:  Diagnosis Date   Allergy    Anxiety    Arthritis    ra, lupus - ankles, knees, hands, neck   Blood transfusion without reported diagnosis 88   [place] with c/s surgery   Bronchitis    Hx   Cancer (HCC)    nasopharynx   COPD (chronic obstructive pulmonary disease) (HCC)    DDD (degenerative disc disease), lumbar    Diabetes mellitus without complication (HCC)    related to prednisone use, no meds,cbg normal   Diverticulosis    Excessive or frequent menstruation    GERD (gastroesophageal reflux disease)    Headache(784.0)    Hiatal hernia    Hypertension    Hypothyroidism    Irregular menstrual cycle    Leiomyoma of uterus, unspecified    Lupus (HCC)    Migraines    Neuromuscular disorder (HCC)    neuropathy in legs/feet   Neuropathy    Papanicolaou smear of cervix with atypical squamous cells of undetermined significance (ASC-US)    Psoriasis    Sinusitis    Trichimoniasis    Wears partial dentures    full upper and lower partial    Past Surgical History:  Procedure Laterality Date   ANKLE ARTHROSCOPY Right    CESAREAN SECTION     x 1   DILATION AND  CURETTAGE OF UTERUS     x 1 Missed Abortion   ESOPHAGEAL DILATION     LYMPH NODE BIOPSY     neck   MULTIPLE EXTRACTIONS WITH ALVEOLOPLASTY N/A 03/05/2013   Procedure: MULTIPLE EXTRACION WITH ALVEOLOPLASTY, extraction of decayed teeth numbers 3,4,5,18,19,22,23,24,25, extraction of retained root tips teeth numbers 2,20,21,26, bilateral mandibular alveoloplasty and upper right maxillary alveoloplasty;  Surgeon: Francene Finders, DDS;  Location: [facility] OR;  Service: Oral Surgery;  Laterality: N/A;   scalp biopsy     TOOTH EXTRACTION Bilateral 03/05/2013   Procedure: EXTRACTION MOLARS;  Surgeon: Francene Finders, DDS;  Location: [facility] OR;  Service: Oral Surgery;  Laterality: Bilateral;    Current Outpatient Medications on File Prior to Visit  Medication Sig Dispense Refill   albuterol (PROVENTIL HFA;VENTOLIN HFA) 108 (90 BASE) MCG/ACT inhaler Inhale 2 puffs into the lungs every 6 (six) hours as needed for wheezing or shortness of breath.      albuterol (PROVENTIL) (2.5 MG/3ML) 0.083% nebulizer solution Take 2.5 mg by nebulization every 6 (six) hours as needed for wheezing or shortness of breath. Reported on 10/27/2015     Ascorbic Acid (VITAMIN C) 1000 MG tablet Take 1,000  mg by mouth 2 (two) times daily.     calcium-vitamin D (OSCAL WITH D) 500-200 MG-UNIT per tablet Take 1 tablet by mouth 2 (two) times daily.     cetirizine (ZYRTEC) 10 MG tablet Take 10 mg by mouth daily.     COSENTYX 300 DOSE 150 MG/ML SOSY Inject 300 mg into the muscle every 28 (twenty-eight) days.      cyclobenzaprine (FLEXERIL) 10 MG tablet Take 10 mg by mouth daily as needed.     diclofenac (VOLTAREN) 75 MG EC tablet Take 1 tablet (75 mg total) by mouth 2 (two) times daily as needed (for neck pain and headaches). 30 tablet 3   ferrous sulfate 325 (65 FE) MG tablet Take 325 mg by mouth daily.     furosemide (LASIX) 40 MG tablet Take 40 mg by mouth daily as needed for fluid or edema.      gabapentin (NEURONTIN) 100 MG capsule  Take 100 mg by mouth 3 (three) times daily.      glucosamine-chondroitin 500-400 MG tablet Take 1 tablet by mouth 2 (two) times daily.     hydrOXYzine (ATARAX/VISTARIL) 10 MG tablet Take 10 mg by mouth at bedtime as needed for itching or anxiety. Reported on 10/27/2015     levothyroxine (SYNTHROID, LEVOTHROID) 75 MCG tablet Take 75 mcg by mouth daily.     metoprolol succinate (TOPROL-XL) 50 MG 24 hr tablet Take 1 tablet by mouth daily.     mometasone (NASONEX) 50 MCG/ACT nasal spray Place 2 sprays into the nose daily.  (Patient not taking: Reported on 12/21/2022)  5   Multiple Vitamin (MULTIVITAMIN WITH MINERALS) TABS Take 1 tablet by mouth daily.     nitroGLYCERIN (NITROSTAT) 0.4 MG SL tablet Place 1 tablet (0.4 mg total) under the tongue every 5 (five) minutes as needed for chest pain. 25 tablet 3   pantoprazole (PROTONIX) 40 MG tablet Take 40 mg by mouth daily.      potassium chloride (K-DUR) 10 MEQ tablet Take 1 tablet by mouth daily.     rizatriptan (MAXALT-MLT) 10 MG disintegrating tablet Take 10 mg by mouth as needed for migraine.   5   SYMBICORT 160-4.5 MCG/ACT inhaler Inhale 2 puffs into the lungs 2 (two) times daily.  5   topiramate (TOPAMAX) 100 MG tablet Take 100 mg by mouth at bedtime.      triamcinolone cream (KENALOG) 0.1 % Apply topically 2 (two) times daily as needed.     VITAMIN E PO Take 400 mg by mouth 2 (two) times daily.      Current Facility-Administered Medications on File Prior to Visit  Medication Dose Route Frequency Provider Last Rate Last Admin   0.9 %  sodium chloride infusion  500 mL Intravenous Once Pyrtle, Carie Caddy, MD        Allergies  Allergen Reactions   Amoxicillin Itching    Did it involve swelling of the face/tongue/throat, SOB, or low BP? No Did it involve sudden or severe rash/hives, skin peeling, or any reaction on the inside of your mouth or nose? Yes Did you need to seek medical attention at a hospital or doctor's office? Yes When did it last happen?       14 yrs ago If all above answers are "NO", may proceed with cephalosporin use.    Latex Itching   Percocet [Oxycodone-Acetaminophen] Itching   Plaquenil [Hydroxychloroquine Sulfate] Other (See Comments)    "Psoriasis."   Bactrim [Sulfamethoxazole-Trimethoprim] Rash   Ciprofloxacin Hcl Rash  Keflex [Cephalexin] Rash   Penicillins Rash    Did it involve swelling of the face/tongue/throat, SOB, or low BP? No Did it involve sudden or severe rash/hives, skin peeling, or any reaction on the inside of your mouth or nose? Yes Did you need to seek medical attention at a hospital or doctor's office? Yes When did it last happen?      14 yrs ago If all above answers are "NO", may proceed with cephalosporin use.    Tetracyclines & Related Rash    Social History   Socioeconomic History   Marital status: Divorced    Spouse name: Not on file   Number of children: Not on file   Years of education: Not on file   Highest education level: Not on file  Occupational History   Not on file  Tobacco Use   Smoking status: Former    Current packs/day: 0.00    Average packs/day: 0.3 packs/day for 8.0 years (2.0 ttl pk-yrs)    Types: Cigarettes    Start date: 02/28/1996    Quit date: 02/28/2004    Years since quitting: 19.0   Smokeless tobacco: Never  Vaping Use   Vaping status: Never Used  Substance and Sexual Activity   Alcohol use: Not Currently    Alcohol/week: 0.0 standard drinks of alcohol   Drug use: No   Sexual activity: Yes    Partners: Male    Birth control/protection: OCP, Pill  Other Topics Concern   Not on file  Social History Narrative   Right/left handed    Wears glasses    No caffeine    Social Determinants of Corporate investment banker Strain: Not on file  Food Insecurity: Not on file  Transportation Needs: Not on file  Physical Activity: Not on file  Stress: Not on file  Social Connections: Unknown (11/01/2021)   Received from [facility], [facility]   Social  Network    Social Network: Not on file  Intimate Partner Violence: Unknown (09/23/2021)   Received from [employer], [facility]   HITS    Physically Hurt: Not on file    Insult or Talk Down To: Not on file    Threaten Physical Harm: Not on file    Scream or Curse: Not on file    Family History  Problem Relation Age of Onset   Hypertension Mother    Diabetes Mother    Hypertension Father    Lupus Brother    Breast cancer Maternal Grandmother 71   Colon cancer Neg Hx    Esophageal cancer Neg Hx    Rectal cancer Neg Hx    Stomach cancer Neg Hx    Colon polyps Neg Hx     The following portions of the patient's history were reviewed and updated as appropriate: allergies, current medications, past family history, past medical history, past social history, past surgical history and problem list.  Review of Systems Pertinent items noted in HPI and remainder of comprehensive ROS otherwise negative.   Objective:  BP 137/84 (BP Location: Right Arm)   Pulse 88   Wt 163 lb 12.8 oz (74.3 kg)   LMP 06/25/2016   BMI 26.44 kg/m  CONSTITUTIONAL: Well-developed, well-nourished female in no acute distress.  HENT:  Normocephalic, atraumatic, External right and left ear normal. Oropharynx is clear and moist EYES: Conjunctivae and EOM are normal. Pupils are equal, round, and reactive to light. No scleral icterus.  NECK: Normal range of motion, supple, no masses.  Normal thyroid.  SKIN: Skin is warm and dry. No rash noted. Not diaphoretic. No erythema. No pallor. NEUROLGIC: Alert and oriented to person, place, and time. Normal reflexes, muscle tone coordination. No cranial nerve deficit noted. PSYCHIATRIC: Normal mood and affect. Normal behavior. Normal judgment and thought content. CARDIOVASCULAR: Normal heart rate noted, regular rhythm RESPIRATORY: Clear to auscultation bilaterally. Effort and breath sounds normal, no problems with respiration noted. BREASTS: Deferred ABDOMEN: Soft,  normal bowel sounds, no distention noted.  No tenderness, rebound or guarding.  PELVIC:  Genital irritation noted normal appearing vaginal mucosa and cervix.  No abnormal discharge noted.  Pap smear obtained.  Normal uterine size, no other palpable masses, no uterine or adnexal tenderness. MUSCULOSKELETAL: Normal range of motion. No tenderness.  No cyanosis, clubbing, or edema.  2+ distal pulses.   Assessment:  Annual gynecologic examination with pap smear   Plan:  Will follow up results of pap smear and manage accordingly. Mammogram scheduled per PCP STD testing as per pt request Recommend Via A & D onit for genital irritation Routine preventative health maintenance measures emphasized. Please refer to After Visit Summary for other counseling recommendations.    Hermina Staggers, MD, FACOG Attending Obstetrician & Gynecologist Center for [facility], [facility]

## 2023-03-12 LAB — RPR: RPR Ser Ql: NONREACTIVE

## 2023-03-12 LAB — HIV ANTIBODY (ROUTINE TESTING W REFLEX): HIV Screen 4th Generation wRfx: NONREACTIVE

## 2023-03-12 LAB — HEPATITIS C ANTIBODY: Hep C Virus Ab: NONREACTIVE

## 2023-03-12 LAB — HEPATITIS B SURFACE ANTIGEN: Hepatitis B Surface Ag: NEGATIVE

## 2023-03-14 LAB — CERVICOVAGINAL ANCILLARY ONLY
Bacterial Vaginitis (gardnerella): POSITIVE — AB
Candida Glabrata: NEGATIVE
Candida Vaginitis: NEGATIVE
Chlamydia: NEGATIVE
Comment: NEGATIVE
Comment: NEGATIVE
Comment: NEGATIVE
Comment: NEGATIVE
Comment: NEGATIVE
Comment: NORMAL
Neisseria Gonorrhea: NEGATIVE
Trichomonas: NEGATIVE

## 2023-03-15 ENCOUNTER — Other Ambulatory Visit: Payer: Self-pay

## 2023-03-15 DIAGNOSIS — B9689 Other specified bacterial agents as the cause of diseases classified elsewhere: Secondary | ICD-10-CM

## 2023-03-15 LAB — CYTOLOGY - PAP
Adequacy: ABSENT
Comment: NEGATIVE
Diagnosis: NEGATIVE
High risk HPV: NEGATIVE

## 2023-03-15 MED ORDER — METRONIDAZOLE 500 MG PO TABS
500.0000 mg | ORAL_TABLET | Freq: Two times a day (BID) | ORAL | 0 refills | Status: DC
Start: 2023-03-15 — End: 2023-07-12

## 2023-03-30 ENCOUNTER — Ambulatory Visit: Payer: 59 | Admitting: Adult Health

## 2023-03-30 DIAGNOSIS — K603 Anal fistula, unspecified: Secondary | ICD-10-CM | POA: Diagnosis not present

## 2023-03-31 NOTE — Progress Notes (Signed)
Sent message, via epic in basket, requesting orders in epic from surgeon.  

## 2023-04-01 DIAGNOSIS — L4059 Other psoriatic arthropathy: Secondary | ICD-10-CM | POA: Diagnosis not present

## 2023-04-01 DIAGNOSIS — Z79899 Other long term (current) drug therapy: Secondary | ICD-10-CM | POA: Diagnosis not present

## 2023-04-03 NOTE — Patient Instructions (Signed)
SURGICAL WAITING ROOM VISITATION Patients having surgery or a procedure may have no more than 2 support people in the waiting area - these visitors may rotate in the visitor waiting room.   Due to an increase in RSV and influenza rates and associated hospitalizations, children ages 34 and under may not visit patients in [facility] hospitals. If the patient needs to stay at the hospital during part of their recovery, the visitor guidelines for inpatient rooms apply.  PRE-OP VISITATION  Pre-op nurse will coordinate an appropriate time for 1 support person to accompany the patient in pre-op.  This support person may not rotate.  This visitor will be contacted when the time is appropriate for the visitor to come back in the pre-op area.  Please refer to the [facility] website for the visitor guidelines for Inpatients (after your surgery is over and you are in a regular room).  You are not required to quarantine at this time prior to your surgery. However, you must do this: Hand Hygiene often Do NOT share personal items Notify your provider if you are in close contact with someone who has COVID or you develop fever 100.4 or greater, new onset of sneezing, cough, sore throat, shortness of breath or body aches.  If you test positive for Covid or have been in contact with anyone that has tested positive in the last 10 days please notify you surgeon.    Your procedure is scheduled on:  Monday  April 11, 2023  Report to [facility] Main Entrance: [address] entrance where the [employer] is available.   Report to admitting at: 10:00    AM  Call this number if you have any questions or problems the morning of surgery [phone]  Do not eat food after Midnight the night prior to your surgery/procedure.  After Midnight you may have the following liquids until    09:15 AM ???  DAY OF SURGERY  Clear Liquid Diet Water Black Coffee (sugar ok, NO MILK/CREAM OR CREAMERS)  Tea (sugar ok,  NO MILK/CREAM OR CREAMERS) regular and decaf                             Plain Jell-O  with no fruit (NO RED)                                           Fruit ices (not with fruit pulp, NO RED)                                     Popsicles (NO RED)                                                                  Juice: NO CITRUS JUICES: only apple, WHITE grape, WHITE cranberry Sports drinks like Gatorade or Powerade (NO RED)                   The day of surgery:  Drink ONE (1) Pre-Surgery Clear Ensure or G2 at   ??????  AM the morning of surgery. Drink in one sitting. Do not sip.  This drink was given to you during your hospital pre-op appointment visit. Nothing else to drink after completing the Pre-Surgery Clear Ensure or G2 : No candy, chewing gum or throat lozenges.    FOLLOW ANY ADDITIONAL PRE OP INSTRUCTIONS YOU RECEIVED FROM YOUR SURGEON'S OFFICE!!!   Oral Hygiene is also important to reduce your risk of infection.        Remember - BRUSH YOUR TEETH THE MORNING OF SURGERY WITH YOUR REGULAR TOOTHPASTE  Do NOT smoke after Midnight the night before surgery.  STOP TAKING all Vitamins, Herbs and supplements 1 week before your surgery.   Take ONLY these medicines the morning of surgery with A SIP OF WATER: cetirizine (Zyrtec), gabapentin, levothyroxine, metoprolol, pantoprazole (Protonix),     You may use your inhalers / nebulizer if needed,  ????Flagyl   If You have been diagnosed with Sleep Apnea - Bring CPAP mask and tubing day of surgery. We will provide you with a CPAP machine on the day of your surgery.                   You may not have any metal on your body including hair pins, jewelry, and body piercing  Do not wear make-up, lotions, powders, perfumes  or deodorant  Do not wear nail polish including gel and S&S, artificial / acrylic nails, or any other type of covering on natural nails including finger and toenails. If you have artificial nails, gel coating, etc., that  needs to be removed by a nail salon, Please have this removed prior to surgery. Not doing so may mean that your surgery could be cancelled or delayed if the Surgeon or anesthesia staff feels like they are unable to monitor you safely.   Do not shave 48 hours prior to surgery to avoid nicks in your skin which may contribute to postoperative infections.   Contacts, Hearing Aids, dentures or bridgework may not be worn into surgery. DENTURES WILL BE REMOVED PRIOR TO SURGERY PLEASE DO NOT APPLY "Poly grip" OR ADHESIVES!!!  Patients discharged on the day of surgery will not be allowed to drive home.  Someone NEEDS to stay with you for the first 24 hours after anesthesia.  Do not bring your home medications to the hospital. The Pharmacy will dispense medications listed on your medication list to you during your admission in the Hospital.  Please read over the following fact sheets you were given: IF YOU HAVE QUESTIONS ABOUT YOUR PRE-OP INSTRUCTIONS, PLEASE CALL [phone]   [facility] - Preparing for Surgery Before surgery, you can play an important role.  Because skin is not sterile, your skin needs to be as free of germs as possible.  You can reduce the number of germs on your skin by washing with CHG (chlorahexidine gluconate) soap before surgery.  CHG is an antiseptic cleaner which kills germs and bonds with the skin to continue killing germs even after washing. Please DO NOT use if you have an allergy to CHG or antibacterial soaps.  If your skin becomes reddened/irritated stop using the CHG and inform your nurse when you arrive at Short Stay. Do not shave (including legs and underarms) for at least 48 hours prior to the first CHG shower.  You may shave your face/neck.  Please follow these instructions carefully:  1.  Shower with CHG Soap the night before surgery and the  morning of surgery.  2.  If  you choose to wash your hair, wash your hair first as usual with your normal  shampoo.  3.  After  you shampoo, rinse your hair and body thoroughly to remove the shampoo.                             4.  Use CHG as you would any other liquid soap.  You can apply chg directly to the skin and wash.  Gently with a scrungie or clean washcloth.  5.  Apply the CHG Soap to your body ONLY FROM THE NECK DOWN.   Do not use on face/ open                           Wound or open sores. Avoid contact with eyes, ears mouth and genitals (private parts).                       Wash face,  Genitals (private parts) with your normal soap.             6.  Wash thoroughly, paying special attention to the area where your  surgery  will be performed.  7.  Thoroughly rinse your body with warm water from the neck down.  8.  DO NOT shower/wash with your normal soap after using and rinsing off the CHG Soap.            9.  Pat yourself dry with a clean towel.            10.  Wear clean pajamas.            11.  Place clean sheets on your bed the night of your first shower and do not  sleep with pets.  ON THE DAY OF SURGERY : Do not apply any lotions/deodorants the morning of surgery.  Please wear clean clothes to the hospital/surgery center.     FAILURE TO FOLLOW THESE INSTRUCTIONS MAY RESULT IN THE CANCELLATION OF YOUR SURGERY  PATIENT SIGNATURE_________________________________  NURSE SIGNATURE__________________________________  ________________________________________________________________________

## 2023-04-03 NOTE — Progress Notes (Signed)
COVID Vaccine received:  [x]  No []  Yes Date of any COVID positive Test in last 90 days:  None  PCP - Jackie Plum, MD and Norva Riffle, [place], [facility] [phone] Cardiologist - Norman Herrlich, MD Rheumatology- Francee Gentile, MD  Neurology-  Ocie Doyne, MD  Chest x-ray - 03-20-2019 2v   Epic EKG -(03-20-2019 Epic) will repeat  04-04-2023 Stress Test -  ECHO - 03-07-2019   Epic Cardiac Cath -  CT coronary Morph-03-23-2019 calcium score: 0   PCR screen: []  Ordered & Completed []   No Order but Needs PROFEND     [x]   N/A for this surgery  Surgery Plan:  [x]  Ambulatory   []  Outpatient in bed  []  Admit Anesthesia:    [x]  General  []  Spinal  []   Choice []   MAC  Bowel Prep - []  No  [x]   Yes _2 fleet enemas__  Pacemaker / ICD device [x]  No []  Yes   Spinal Cord Stimulator:[x]  No []  Yes       History of Sleep Apnea? [x]  No []  Yes   CPAP used?- [x]  No []  Yes    Does the patient monitor blood sugar?   [x]  N/A   []  No []  Yes  Patient has: [x]  NO Hx DM   []  Pre-DM   []  DM1  []   DM2     Blood Thinner / Instructions: none Aspirin Instructions:   none  ERAS Protocol Ordered: [x]  No  []  Yes Patient is to be NPO after:  midnight   Dental hx: [x]  Dentures: Upper set []  N/A      [x]  Bridge or Partial:  Lower Partial                  []  Loose or Damaged teeth:   Comments: patient had bloodwork (CBC diff, CMP) at [facility] on 04-01-2023, These were used for her PST labs.  Activity level: Patient is able to climb a flight of stairs without difficulty; [x]  No CP but would have some SOB. Patient can perform ADLs without assistance.   Anesthesia review: nasopharyngeal cancer s/p radiation,  HTN, GERD, Psoriatic Arthritis, RA, Lupus, CPS, asthma, neuropathy, anemia, anxiety   Patient denies shortness of breath, fever, cough and chest pain at PAT appointment.  Patient verbalized understanding and agreement to the Pre-Surgical Instructions that were given to them at  this PAT appointment. Patient was also educated of the need to review these PAT instructions again prior to her surgery.I reviewed the appropriate phone numbers to call if they have any and questions or concerns.

## 2023-04-04 ENCOUNTER — Encounter (HOSPITAL_COMMUNITY)
Admission: RE | Admit: 2023-04-04 | Discharge: 2023-04-04 | Disposition: A | Discharge status: Home / Self Care | Payer: 59 | Source: Ambulatory Visit | Attending: Surgery

## 2023-04-04 ENCOUNTER — Ambulatory Visit: Payer: Self-pay | Admitting: Surgery

## 2023-04-04 ENCOUNTER — Other Ambulatory Visit: Payer: Self-pay

## 2023-04-04 ENCOUNTER — Encounter (HOSPITAL_COMMUNITY): Payer: Self-pay

## 2023-04-04 VITALS — BP 187/89 | HR 81 | Temp 98.9°F | Resp 18 | Ht 63.0 in | Wt 161.0 lb

## 2023-04-04 DIAGNOSIS — Z01810 Encounter for preprocedural cardiovascular examination: Secondary | ICD-10-CM | POA: Insufficient documentation

## 2023-04-04 DIAGNOSIS — Z01818 Encounter for other preprocedural examination: Secondary | ICD-10-CM

## 2023-04-04 DIAGNOSIS — I10 Essential (primary) hypertension: Secondary | ICD-10-CM | POA: Diagnosis not present

## 2023-04-04 HISTORY — DX: Pneumonia, unspecified organism: J18.9

## 2023-04-04 HISTORY — DX: Cardiac arrhythmia, unspecified: I49.9

## 2023-04-04 HISTORY — DX: Chronic kidney disease, unspecified: N18.9

## 2023-04-04 HISTORY — DX: Anemia, unspecified: D64.9

## 2023-04-08 NOTE — Progress Notes (Signed)
Anesthesia Chart Review   Case: [id number] Date/Time: 04/11/23 1145   Procedures:      PLACEMENT OF DRAINING  SETON VS FISTULOTOMY     ANORECTAL EXAM UNDER ANESTHESIA   Anesthesia type: General   Pre-op diagnosis: ANAL FISTULA   Location: [facility] ROOM 01 / [facility]   Surgeons: Savannah Meuse, MD       DISCUSSION:57 y.o. former smoker with h/o HTN, Lupus, COPD, DM, CKD, anal fistula scheduled for above procedure 04/11/23 with Dr. Marin Maxwell.   Pt with h/o nasopharyngeal cancer s/p radiation 18 years ago. Pt with dentures in place, s/p multiple extractions in 2014. Limited mouth opening.  No anesthesia complications noted with dental extractions in 2014. Evaluate DOS.  VS: BP (!) 187/89 Comment: right arm sitting,  no meds yet this morning  Pulse 81   Temp 37.2 C (Oral)   Resp 18   Ht 5\' 3"  (1.6 m)   Wt 73 kg   LMP 06/25/2016   SpO2 100%   BMI 28.52 kg/m   PROVIDERS: Savannah Salt, PA   LABS:  labs wnl, in Care Everywhere 04/01/2023  (all labs ordered are listed, but only abnormal results are displayed)  Labs Reviewed - No data to display   IMAGES:   EKG:   CV: Echo 03/07/2019  1. The left ventricle has normal systolic function, with an ejection  fraction of 55-60%. The cavity size was normal. There is moderately  increased left ventricular wall thickness. Left ventricular diastolic  Doppler parameters are consistent with  impaired relaxation.   2. The right ventricle has normal systolic function. The cavity was  normal. There is no increase in right ventricular wall thickness.   3. The aortic valve is tricuspid.   4. The aorta is normal unless otherwise noted.  Past Medical History:  Diagnosis Date   Allergy    Anemia    Anxiety    Arthritis    ra, lupus - ankles, knees, hands, neck   Blood transfusion without reported diagnosis 54   [place] with c/s surgery   Bronchitis    Hx   Cancer (HCC)    nasopharynx   Chronic kidney disease     COPD (chronic obstructive pulmonary disease) (HCC)    DDD (degenerative disc disease), lumbar    Diabetes mellitus without complication (HCC)    related to prednisone use, no meds,cbg normal   Diverticulosis    Dysrhythmia    palpitations   Excessive or frequent menstruation    GERD (gastroesophageal reflux disease)    Headache(784.0)    History of kidney stones 2014   Hypertension    Hypothyroidism    Irregular menstrual cycle    Leiomyoma of uterus, unspecified    Lupus    Migraines    takes Topamax and Maxalt   Neuromuscular disorder (HCC)    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   Neuropathy    Papanicolaou smear of cervix with atypical squamous cells of undetermined significance (ASC-US)    Pneumonia    Psoriasis    Sinusitis    Trichimoniasis    Wears partial dentures    full upper and lower partial    Past Surgical History:  Procedure Laterality Date   ANKLE ARTHROSCOPY Right    CESAREAN SECTION     x 1   DILATION AND CURETTAGE OF UTERUS     x 1 Missed Abortion   ESOPHAGEAL DILATION     x 2   LYMPH NODE BIOPSY  neck   MULTIPLE EXTRACTIONS WITH ALVEOLOPLASTY N/A 03/05/2013   Procedure: MULTIPLE EXTRACION WITH ALVEOLOPLASTY, extraction of decayed teeth numbers 3,4,5,18,19,22,23,24,25, extraction of retained root tips teeth numbers 2,20,21,26, bilateral mandibular alveoloplasty and upper right maxillary alveoloplasty;  Surgeon: [name], DDS;  Location: [facility] OR;  Service: Oral Surgery;  Laterality: N/A;   scalp biopsy     TOOTH EXTRACTION Bilateral 03/05/2013   Procedure: EXTRACTION MOLARS;  Surgeon: [name], DDS;  Location: [facility] OR;  Service: Oral Surgery;  Laterality: Bilateral;    MEDICATIONS:  albuterol (PROVENTIL HFA;VENTOLIN HFA) 108 (90 BASE) MCG/ACT inhaler   albuterol (PROVENTIL) (2.5 MG/3ML) 0.083% nebulizer solution   Ascorbic Acid (VITAMIN C) 1000 MG tablet   calcium-vitamin D (OSCAL WITH D) 500-200 MG-UNIT per tablet   cetirizine  (ZYRTEC) 10 MG tablet   COSENTYX 300 DOSE 150 MG/ML SOSY   diclofenac (VOLTAREN) 75 MG EC tablet   ferrous sulfate 325 (65 FE) MG tablet   furosemide (LASIX) 40 MG tablet   gabapentin (NEURONTIN) 100 MG capsule   glucosamine-chondroitin 500-400 MG tablet   hydrOXYzine (ATARAX/VISTARIL) 10 MG tablet   levothyroxine (SYNTHROID, LEVOTHROID) 75 MCG tablet   metoprolol succinate (TOPROL-XL) 50 MG 24 hr tablet   metroNIDAZOLE (FLAGYL) 500 MG tablet   nitroGLYCERIN (NITROSTAT) 0.4 MG SL tablet   pantoprazole (PROTONIX) 40 MG tablet   potassium chloride (K-DUR) 10 MEQ tablet   predniSONE (DELTASONE) 20 MG tablet   rizatriptan (MAXALT-MLT) 10 MG disintegrating tablet   SYMBICORT 160-4.5 MCG/ACT inhaler   topiramate (TOPAMAX) 100 MG tablet   triamcinolone cream (KENALOG) 0.1 %   VITAMIN E PO    0.9 %  sodium chloride infusion     Savannah Villers Ward, PA-C [facility] [phone]

## 2023-04-11 ENCOUNTER — Ambulatory Visit (HOSPITAL_COMMUNITY)
Admission: RE | Admit: 2023-04-11 | Discharge: 2023-04-11 | Disposition: A | Discharge status: Home / Self Care | Payer: 59 | Source: Ambulatory Visit | Attending: Surgery | Admitting: Surgery

## 2023-04-11 ENCOUNTER — Other Ambulatory Visit: Payer: Self-pay

## 2023-04-11 ENCOUNTER — Ambulatory Visit (HOSPITAL_COMMUNITY): Payer: 59 | Admitting: Physician Assistant

## 2023-04-11 ENCOUNTER — Encounter (HOSPITAL_COMMUNITY)
Admission: RE | Disposition: A | Discharge status: Home / Self Care | Payer: Self-pay | Source: Ambulatory Visit | Attending: Surgery

## 2023-04-11 ENCOUNTER — Encounter (HOSPITAL_COMMUNITY): Payer: Self-pay | Admitting: Surgery

## 2023-04-11 ENCOUNTER — Ambulatory Visit (HOSPITAL_BASED_OUTPATIENT_CLINIC_OR_DEPARTMENT_OTHER): Payer: 59

## 2023-04-11 DIAGNOSIS — M069 Rheumatoid arthritis, unspecified: Secondary | ICD-10-CM | POA: Insufficient documentation

## 2023-04-11 DIAGNOSIS — I129 Hypertensive chronic kidney disease with stage 1 through stage 4 chronic kidney disease, or unspecified chronic kidney disease: Secondary | ICD-10-CM | POA: Insufficient documentation

## 2023-04-11 DIAGNOSIS — K60311 Anal fistula, simple, initial: Secondary | ICD-10-CM | POA: Diagnosis not present

## 2023-04-11 DIAGNOSIS — K219 Gastro-esophageal reflux disease without esophagitis: Secondary | ICD-10-CM | POA: Diagnosis not present

## 2023-04-11 DIAGNOSIS — K603 Anal fistula, unspecified: Secondary | ICD-10-CM | POA: Diagnosis not present

## 2023-04-11 DIAGNOSIS — J449 Chronic obstructive pulmonary disease, unspecified: Secondary | ICD-10-CM | POA: Diagnosis not present

## 2023-04-11 DIAGNOSIS — Z7951 Long term (current) use of inhaled steroids: Secondary | ICD-10-CM | POA: Diagnosis not present

## 2023-04-11 DIAGNOSIS — K60329 Anal fistula, complex, unspecified: Secondary | ICD-10-CM | POA: Insufficient documentation

## 2023-04-11 DIAGNOSIS — E039 Hypothyroidism, unspecified: Secondary | ICD-10-CM | POA: Diagnosis not present

## 2023-04-11 DIAGNOSIS — L4050 Arthropathic psoriasis, unspecified: Secondary | ICD-10-CM | POA: Diagnosis not present

## 2023-04-11 DIAGNOSIS — M329 Systemic lupus erythematosus, unspecified: Secondary | ICD-10-CM | POA: Diagnosis not present

## 2023-04-11 DIAGNOSIS — K5730 Diverticulosis of large intestine without perforation or abscess without bleeding: Secondary | ICD-10-CM | POA: Diagnosis not present

## 2023-04-11 DIAGNOSIS — E1142 Type 2 diabetes mellitus with diabetic polyneuropathy: Secondary | ICD-10-CM | POA: Insufficient documentation

## 2023-04-11 DIAGNOSIS — E1122 Type 2 diabetes mellitus with diabetic chronic kidney disease: Secondary | ICD-10-CM | POA: Insufficient documentation

## 2023-04-11 DIAGNOSIS — Z01818 Encounter for other preprocedural examination: Secondary | ICD-10-CM

## 2023-04-11 DIAGNOSIS — N189 Chronic kidney disease, unspecified: Secondary | ICD-10-CM | POA: Diagnosis not present

## 2023-04-11 HISTORY — PX: PLACEMENT OF SETON: SHX6029

## 2023-04-11 LAB — GLUCOSE, CAPILLARY
Glucose-Capillary: 101 mg/dL — ABNORMAL HIGH (ref 70–99)
Glucose-Capillary: 77 mg/dL (ref 70–99)

## 2023-04-11 SURGERY — PLACEMENT, SETON
Anesthesia: General

## 2023-04-11 MED ORDER — EPHEDRINE SULFATE (PRESSORS) 50 MG/ML IJ SOLN
INTRAMUSCULAR | Status: DC | PRN
Start: 2023-04-11 — End: 2023-04-11
  Administered 2023-04-11: 5 mg via INTRAVENOUS

## 2023-04-11 MED ORDER — ESMOLOL HCL 100 MG/10ML IV SOLN
INTRAVENOUS | Status: DC | PRN
  Administered 2023-04-11: 20 mg via INTRAVENOUS

## 2023-04-11 MED ORDER — LACTATED RINGERS IV SOLN: INTRAVENOUS | Status: DC

## 2023-04-11 MED ORDER — SUGAMMADEX SODIUM 200 MG/2ML IV SOLN
INTRAVENOUS | Status: DC | PRN
  Administered 2023-04-11: 200 mg via INTRAVENOUS

## 2023-04-11 MED ORDER — BUPIVACAINE LIPOSOME 1.3 % IJ SUSP
INTRAMUSCULAR | Status: DC | PRN
  Administered 2023-04-11: 40 mL

## 2023-04-11 MED ORDER — TRAMADOL HCL 50 MG PO TABS: 50.0000 mg | ORAL_TABLET | Freq: Four times a day (QID) | ORAL | 0 refills | Status: AC | PRN

## 2023-04-11 MED ORDER — FLEET ENEMA RE ENEM
1.0000 | ENEMA | Freq: Once | RECTAL | Status: DC
  Filled 2023-04-11: qty 1

## 2023-04-11 MED ORDER — MIDAZOLAM HCL 5 MG/5ML IJ SOLN
INTRAMUSCULAR | Status: DC | PRN
  Administered 2023-04-11 (×2): 1 mg via INTRAVENOUS

## 2023-04-11 MED ORDER — KETAMINE HCL 50 MG/5ML IJ SOSY
PREFILLED_SYRINGE | INTRAMUSCULAR | Status: AC
  Filled 2023-04-11: qty 5

## 2023-04-11 MED ORDER — LIDOCAINE HCL (PF) 2 % IJ SOLN
INTRAMUSCULAR | Status: AC
  Filled 2023-04-11: qty 10

## 2023-04-11 MED ORDER — LIDOCAINE HCL (CARDIAC) PF 100 MG/5ML IV SOSY
PREFILLED_SYRINGE | INTRAVENOUS | Status: DC | PRN
  Administered 2023-04-11: 50 mg via INTRAVENOUS

## 2023-04-11 MED ORDER — PROPOFOL 10 MG/ML IV BOLUS
INTRAVENOUS | Status: DC | PRN
  Administered 2023-04-11: 100 mg via INTRAVENOUS

## 2023-04-11 MED ORDER — PHENYLEPHRINE HCL (PRESSORS) 10 MG/ML IV SOLN
INTRAVENOUS | Status: DC | PRN
Start: 2023-04-11 — End: 2023-04-11
  Administered 2023-04-11 (×2): 100 ug via INTRAVENOUS
  Administered 2023-04-11: 80 ug via INTRAVENOUS

## 2023-04-11 MED ORDER — ORAL CARE MOUTH RINSE: 15.0000 mL | Freq: Once | OROMUCOSAL | Status: AC

## 2023-04-11 MED ORDER — DEXAMETHASONE SODIUM PHOSPHATE 10 MG/ML IJ SOLN
INTRAMUSCULAR | Status: DC | PRN
  Administered 2023-04-11: 4 mg via INTRAVENOUS

## 2023-04-11 MED ORDER — MIDAZOLAM HCL 2 MG/2ML IJ SOLN
INTRAMUSCULAR | Status: AC
  Filled 2023-04-11: qty 2

## 2023-04-11 MED ORDER — METHYLENE BLUE (ANTIDOTE) 1 % IV SOLN
INTRAVENOUS | Status: DC | PRN
Start: 2023-04-11 — End: 2023-04-11
  Administered 2023-04-11: 1 mL

## 2023-04-11 MED ORDER — BUPIVACAINE LIPOSOME 1.3 % IJ SUSP: 20.0000 mL | Freq: Once | INTRAMUSCULAR | Status: DC

## 2023-04-11 MED ORDER — FENTANYL CITRATE (PF) 100 MCG/2ML IJ SOLN
INTRAMUSCULAR | Status: DC | PRN
  Administered 2023-04-11 (×2): 50 ug via INTRAVENOUS

## 2023-04-11 MED ORDER — ONDANSETRON HCL 4 MG/2ML IJ SOLN
INTRAMUSCULAR | Status: DC | PRN
  Administered 2023-04-11: 4 mg via INTRAVENOUS

## 2023-04-11 MED ORDER — BUPIVACAINE LIPOSOME 1.3 % IJ SUSP
INTRAMUSCULAR | Status: AC
  Filled 2023-04-11: qty 20

## 2023-04-11 MED ORDER — ACETAMINOPHEN 500 MG PO TABS
1000.0000 mg | ORAL_TABLET | ORAL | Status: AC
  Administered 2023-04-11: 1000 mg via ORAL
  Filled 2023-04-11: qty 2

## 2023-04-11 MED ORDER — FENTANYL CITRATE (PF) 100 MCG/2ML IJ SOLN
INTRAMUSCULAR | Status: AC
  Filled 2023-04-11: qty 2

## 2023-04-11 MED ORDER — ROCURONIUM BROMIDE 10 MG/ML (PF) SYRINGE
PREFILLED_SYRINGE | INTRAVENOUS | Status: AC
  Filled 2023-04-11: qty 10

## 2023-04-11 MED ORDER — LIDOCAINE HCL (PF) 2 % IJ SOLN
INTRAMUSCULAR | Status: AC
  Filled 2023-04-11: qty 5

## 2023-04-11 MED ORDER — FENTANYL CITRATE PF 50 MCG/ML IJ SOSY: 25.0000 ug | PREFILLED_SYRINGE | INTRAMUSCULAR | Status: DC | PRN

## 2023-04-11 MED ORDER — EPHEDRINE 5 MG/ML INJ
INTRAVENOUS | Status: AC
  Filled 2023-04-11: qty 5

## 2023-04-11 MED ORDER — AMISULPRIDE (ANTIEMETIC) 5 MG/2ML IV SOLN: 10.0000 mg | Freq: Once | INTRAVENOUS | Status: DC | PRN

## 2023-04-11 MED ORDER — PROPOFOL 10 MG/ML IV BOLUS
INTRAVENOUS | Status: AC
  Filled 2023-04-11: qty 20

## 2023-04-11 MED ORDER — METHYLENE BLUE (ANTIDOTE) 1 % IV SOLN
INTRAVENOUS | Status: AC
  Filled 2023-04-11: qty 10

## 2023-04-11 MED ORDER — 0.9 % SODIUM CHLORIDE (POUR BTL) OPTIME
TOPICAL | Status: DC | PRN
Start: 2023-04-11 — End: 2023-04-11
  Administered 2023-04-11: 1000 mL

## 2023-04-11 MED ORDER — KETAMINE HCL 50 MG/5ML IJ SOSY
PREFILLED_SYRINGE | INTRAMUSCULAR | Status: DC | PRN
  Administered 2023-04-11: 10 mg via INTRAVENOUS

## 2023-04-11 MED ORDER — DEXAMETHASONE SODIUM PHOSPHATE 10 MG/ML IJ SOLN
INTRAMUSCULAR | Status: AC
  Filled 2023-04-11: qty 1

## 2023-04-11 MED ORDER — ONDANSETRON HCL 4 MG/2ML IJ SOLN
INTRAMUSCULAR | Status: AC
  Filled 2023-04-11: qty 2

## 2023-04-11 MED ORDER — BUPIVACAINE HCL (PF) 0.25 % IJ SOLN
INTRAMUSCULAR | Status: AC
  Filled 2023-04-11: qty 30

## 2023-04-11 MED ORDER — PHENYLEPHRINE 80 MCG/ML (10ML) SYRINGE FOR IV PUSH (FOR BLOOD PRESSURE SUPPORT)
PREFILLED_SYRINGE | INTRAVENOUS | Status: AC
  Filled 2023-04-11: qty 10

## 2023-04-11 MED ORDER — ROCURONIUM BROMIDE 100 MG/10ML IV SOLN
INTRAVENOUS | Status: DC | PRN
  Administered 2023-04-11: 50 mg via INTRAVENOUS

## 2023-04-11 MED ORDER — CHLORHEXIDINE GLUCONATE 0.12 % MT SOLN
15.0000 mL | Freq: Once | OROMUCOSAL | Status: AC
  Administered 2023-04-11: 15 mL via OROMUCOSAL

## 2023-04-11 SURGICAL SUPPLY — 33 items
BAG COUNTER SPONGE SURGICOUNT (BAG) IMPLANT
BAG SPNG CNTER NS LX DISP (BAG)
BRIEF MESH DISP LRG (UNDERPADS AND DIAPERS) ×1 IMPLANT
ELECT NDL BLADE 2-5/6 (NEEDLE) ×1 IMPLANT
ELECT NEEDLE BLADE 2-5/6 (NEEDLE) ×1
ELECT REM PT RETURN 15FT ADLT (MISCELLANEOUS) ×1 IMPLANT
GAUZE PAD ABD 8X10 STRL (GAUZE/BANDAGES/DRESSINGS) IMPLANT
GAUZE SPONGE 4X4 12PLY STRL (GAUZE/BANDAGES/DRESSINGS) IMPLANT
GLOVE BIO SURGEON STRL SZ7.5 (GLOVE) ×1 IMPLANT
GLOVE INDICATOR 8.0 STRL GRN (GLOVE) ×1 IMPLANT
GOWN STRL REUS W/ TWL XL LVL3 (GOWN DISPOSABLE) ×2 IMPLANT
GOWN STRL REUS W/TWL XL LVL3 (GOWN DISPOSABLE) ×2
KIT BASIN OR (CUSTOM PROCEDURE TRAY) ×1 IMPLANT
KIT TURNOVER KIT A (KITS) IMPLANT
LOOP VESSEL MAXI BLUE (MISCELLANEOUS) IMPLANT
NDL HYPO 22X1.5 SAFETY MO (MISCELLANEOUS) ×1 IMPLANT
NEEDLE HYPO 22X1.5 SAFETY MO (MISCELLANEOUS) ×1
PACK GENERAL/GYN (CUSTOM PROCEDURE TRAY) ×1 IMPLANT
SHEARS HARMONIC 9CM CVD (BLADE) IMPLANT
SPIKE FLUID TRANSFER (MISCELLANEOUS) ×1 IMPLANT
SPONGE SURGIFOAM ABS GEL 100 (HEMOSTASIS) IMPLANT
SURGILUBE 2OZ TUBE FLIPTOP (MISCELLANEOUS) ×1 IMPLANT
SUT CHROMIC 2 0 SH (SUTURE) IMPLANT
SUT CHROMIC 3 0 SH 27 (SUTURE) IMPLANT
SUT MNCRL AB 4-0 PS2 18 (SUTURE) ×1 IMPLANT
SUT SILK 2 0 (SUTURE) ×1
SUT SILK 2 0 SH (SUTURE) IMPLANT
SUT SILK 2-0 18XBRD TIE 12 (SUTURE) IMPLANT
SUT VIC AB 3-0 SH 27 (SUTURE) ×1
SUT VIC AB 3-0 SH 27X BRD (SUTURE) ×1 IMPLANT
SYR 20ML LL LF (SYRINGE) ×1 IMPLANT
TOWEL OR 17X26 10 PK STRL BLUE (TOWEL DISPOSABLE) ×1 IMPLANT
TOWEL OR NON WOVEN STRL DISP B (DISPOSABLE) ×1 IMPLANT

## 2023-04-11 NOTE — Anesthesia Postprocedure Evaluation (Signed)
Anesthesia Post Note  Patient: [name]  Procedure(s) Performed: PLACEMENT OF DRAINING  SETON ANORECTAL EXAM UNDER ANESTHESIA     Patient location during evaluation: PACU Anesthesia Type: General Level of consciousness: awake Pain management: pain level controlled Vital Signs Assessment: post-procedure vital signs reviewed and stable Respiratory status: spontaneous breathing, nonlabored ventilation and respiratory function stable Cardiovascular status: blood pressure returned to baseline and stable Postop Assessment: no apparent nausea or vomiting Anesthetic complications: no   No notable events documented.  Last Vitals:  Vitals:   04/11/23 1400 04/11/23 1430  BP: (!) 169/94 (!) 162/96  Pulse: 66 77  Resp: 11 12  Temp: 36.5 C   SpO2: 100% 99%    Last Pain:  Vitals:   04/11/23 1430  TempSrc:   PainSc: 0-No pain                 Linton Rump

## 2023-04-11 NOTE — Anesthesia Procedure Notes (Signed)
Procedure Name: Intubation Date/Time: 04/11/2023 12:20 PM  Performed by: Garth Bigness, CRNAPre-anesthesia Checklist: Patient identified, Emergency Drugs available, Suction available and Patient being monitored Patient Re-evaluated:Patient Re-evaluated prior to induction Oxygen Delivery Method: Circle system utilized Preoxygenation: Pre-oxygenation with 100% oxygen Induction Type: IV induction Ventilation: Mask ventilation without difficulty Laryngoscope Size: Mac and 3 Grade View: Grade I Tube type: Oral Tube size: 7.0 mm Number of attempts: 1 Airway Equipment and Method: Stylet Placement Confirmation: ETT inserted through vocal cords under direct vision, positive ETCO2 and breath sounds checked- equal and bilateral Secured at: 22 cm Tube secured with: Tape Dental Injury: Teeth and Oropharynx as per pre-operative assessment

## 2023-04-11 NOTE — Op Note (Signed)
04/11/2023  1:10 PM  PATIENT:  [name]  57 y.o. female  Patient Care Team: Norm Salt, PA as PCP - General (Physician Assistant) Baldo Daub, MD as PCP - Cardiology (Cardiology)  PRE-OPERATIVE DIAGNOSIS:  Anal fistula  POST-OPERATIVE DIAGNOSIS:  Low transsphincteric anal fistula  PROCEDURE:   Surgical treatment of transsphincteric anal fistula with placement of draining seton Anorectal exam under anesthesia  SURGEON:  Surgeon(s): Andria Meuse, MD  ASSISTANT: OR staff   ANESTHESIA:   local and general  SPECIMEN:  No Specimen  DISPOSITION OF SPECIMEN:  N/A  COUNTS:  Sponge, needle, and instrument counts were reported correct x2 at conclusion.  EBL: 3 mL  Drains: None  PLAN OF CARE: Discharge to home after PACU  PATIENT DISPOSITION:  PACU - hemodynamically stable.  OR FINDINGS: Left posterior low transsphincteric anal fistula, controlled with blue vessel loop draining seton.  DESCRIPTION: The patient was identified in the preoperative holding area and taken to the OR. SCDs were applied. She then underwent general endotracheal anesthesia without difficulty. The patient was then rolled onto the OR table in the prone jackknife position. Pressure points were then evaluated and padded. Benzoin was applied to the buttocks and they were gently taped apart.  She was then prepped and draped in usual sterile fashion.  A surgical timeout was performed indicating the correct patient, procedure, and positioning.  A perianal block was then created using a dilute mixture of 0.25% Marcaine with epinephrine and Exparel.  After ascertaining an appropriate level of anesthesia had been achieved, a well lubricated digital rectal exam was performed. This demonstrated no palpable masses or other abnormalities.  A Hill-Ferguson anoscope was into the anal canal and circumferential inspection demonstrated healthy appearing anoderm.  No anal fissures.  No significant  hemorrhoidal components.  Some faint granulation like tissue in the posterior midline at the level of the dentate line.  Externally, the area of concern has been noted in the left posterior position.  This location there is scarring and a palpable cord/cavity emanating towards the anal canal.  The skin at this area is thinned.  It is with a scalpel.  There is a deep cavity.  We are then able to use a lubricated fistula probe to carefully probed the tract.  Care was taken not to create a false passages.  We also used the assistance of diluted methylene blue to help delineate the tract.  There is evident communication of the posterior midline at the level of dentate.  Were able to identify this tract and control it with her fistula probe.  The tract is palpated.  It is found to involve the external sphincter and portions of the internal sphincter.  This was clinically consistent with a transsphincteric anal fistula.  There is also a sizable cavity at this location likely from recurrent abscesses at this spot.  The probe was then exchanged for a blue vessel loop seton.  This was then secured to itself using 2-0 silk ties.  The anal canal is irrigated.  Hemostasis is verified.  All sponge, needle, and instrument counts were reported correct.  The buttocks are untaped and a dressing consisting of 4 x 4's, ABD, and mesh underwear was placed.  She was rolled onto a stretcher, awakened from anesthesia, extubated, and transported to the recovery room in satisfactory condition.  DISPOSITION: PACU in satisfactory condition.

## 2023-04-11 NOTE — Discharge Instructions (Addendum)
ANORECTAL SURGERY: POST OP INSTRUCTIONS  DIET: Follow a light bland diet the first 24 hours after arrival home, such as soup, liquids, crackers, etc.  Be sure to include lots of fluids daily.  Avoid fast food or heavy meals as your are more likely to get nauseated.  Eat a low fat diet the next few days after surgery.   Some bleeding with bowel movements is expected for the first couple of days but this should stop in between bowel movements  Take your usually prescribed home medications unless otherwise directed. No foreign bodies per rectum for the next 3 months (enemas, etc)  PAIN CONTROL: It is helpful to take an over-the-counter pain medication regularly for the first few days/weeks.  Choose from the following that works best for you: Ibuprofen (Advil, etc) Three 200mg tabs every 6 hours as needed. Acetaminophen (Tylenol, etc) 500-650mg every 6 hours as needed NOTE: You may take both of these medications together - most patients find it most helpful when alternating between the two (i.e. Ibuprofen at 6am, tylenol at 9am, ibuprofen at 12pm ...) A  prescription for pain medication may have been prescribed for you at discharge.  Take your pain medication as prescribed.  If you are having problems/concerns with the prescription medicine, please call us for further advice.  Avoid getting constipated.  Between the surgery and the pain medications, it is common to experience some constipation.  Increasing fluid intake (64oz of water per day) and taking a fiber supplement (such as Metamucil, Citrucel, FiberCon) 1-2 times a day regularly will usually help prevent this problem from occurring.  Take Miralax (over the counter) 1-2x/day while taking a narcotic pain medication. If no bowel movement after 48hours, you may additionally take a laxative like a bottle of Milk of Magnesia which can be purchased over the counter. Avoid enemas.   Watch out for diarrhea.  If you have many loose bowel movements,  simplify your diet to bland foods.  Stop any stool softeners and decrease your fiber supplement. If this worsens or does not improve, please call us.  Wash / shower every day.  If you were discharged with a dressing, you may remove this the day after your surgery. You may shower normally, getting soap/water on your wound, particularly after bowel movements.  Soaking in a warm bath filled a couple inches ("Sitz bath") is a great way to clean the area after a bowel movement and many patients find it is a way to soothe the area.  ACTIVITIES as tolerated:   You may resume regular (light) daily activities beginning the next day--such as daily self-care, walking, climbing stairs--gradually increasing activities as tolerated.  If you can walk 30 minutes without difficulty, it is safe to try more intense activity such as jogging, treadmill, bicycling, low-impact aerobics, etc. Refrain from any heavy lifting or straining for the first 2 weeks after your procedure, particularly if your surgery was for hemorrhoids. Avoid activities that make your pain worse You may drive when you are no longer taking prescription pain medication, you can comfortably wear a seatbelt, and you can safely maneuver your car and apply brakes.  FOLLOW UP in our office Please call [facility] at [phone] to set up an appointment to see your surgeon in the office for a follow-up appointment approximately 2 weeks after your surgery. Make sure that you call for this appointment the day you arrive home to insure a convenient appointment time.  9. If you have disability or family leave forms   that need to be completed, you may have them completed by your primary care physician's office; for return to work instructions, please ask our office staff and they will be happy to assist you in obtaining this documentation   When to call us [phone]: Poor pain control Reactions / problems with new medications (rash/itching, etc)  Fever over  101.5 F (38.5 C) Inability to urinate Nausea/vomiting Worsening swelling or bruising Continued bleeding from incision. Increased pain, redness, or drainage from the incision  The clinic staff is available to answer your questions during regular business hours (8:30am-5pm).  Please don't hesitate to call and ask to speak to one of our nurses for clinical concerns.   A surgeon from [facility] is always on call at the hospitals   If you have a medical emergency, go to the nearest emergency room or call 911.   [facility] [address], [address], [place], [place]  [address] MAIN: [phone] FAX: [phone] [email or web address] 

## 2023-04-11 NOTE — Transfer of Care (Signed)
Immediate Anesthesia Transfer of Care Note  Patient: Caprice Red  Procedure(s) Performed: PLACEMENT OF DRAINING  SETON ANORECTAL EXAM UNDER ANESTHESIA  Patient Location: PACU  Anesthesia Type:General  Level of Consciousness: awake, alert , oriented, and patient cooperative  Airway & Oxygen Therapy: Patient Spontanous Breathing and Patient connected to face mask oxygen  Post-op Assessment: Report given to RN and Post -op Vital signs reviewed and stable  Post vital signs: Reviewed and stable  Last Vitals:  Vitals Value Taken Time  BP 166/99 04/11/23 1326  Temp    Pulse    Resp 16 04/11/23 1329  SpO2    Vitals shown include unfiled device data.  Last Pain:  Vitals:   04/11/23 1031  TempSrc: Oral         Complications: No notable events documented.

## 2023-04-11 NOTE — Anesthesia Preprocedure Evaluation (Addendum)
Anesthesia Evaluation  Patient identified by MRN, date of birth, ID band Patient awake    Reviewed: Allergy & Precautions, NPO status , Patient's Chart, lab work & pertinent test results  History of Anesthesia Complications Negative for: history of anesthetic complications  Airway Mallampati: II  TM Distance: >3 FB Neck ROM: Full    Dental  (+) Dental Advisory Given, Edentulous Upper, Edentulous Lower   Pulmonary neg shortness of breath, neg sleep apnea, COPD, neg recent URI, former smoker   Pulmonary exam normal breath sounds clear to auscultation       Cardiovascular hypertension (metoprolol), Pt. on home beta blockers and Pt. on medications (-) angina (-) Past MI, (-) Cardiac Stents, (-) CABG and (-) Orthopnea + dysrhythmias  Rhythm:Regular Rate:Normal  TTE 03/07/2019: IMPRESSIONS     1. The left ventricle has normal systolic function, with an ejection  fraction of 55-60%. The cavity size was normal. There is moderately  increased left ventricular wall thickness. Left ventricular diastolic  Doppler parameters are consistent with  impaired relaxation.   2. The right ventricle has normal systolic function. The cavity was  normal. There is no increase in right ventricular wall thickness.   3. The aortic valve is tricuspid.   4. The aorta is normal unless otherwise noted.     Neuro/Psych  Headaches, neg Seizures PSYCHIATRIC DISORDERS Anxiety      Neuromuscular disease (neuropathy)    GI/Hepatic Neg liver ROS,GERD  Medicated,,diverticulosis   Endo/Other  diabetes (no medications)Hypothyroidism    Renal/GU CRFRenal disease     Musculoskeletal  (+) Arthritis ,    Abdominal   Peds  Hematology  (+) Blood dyscrasia, anemia   Anesthesia Other Findings Lupus, psoriasis, h/o nasopharyngeal cancer  Prednisone 20 mg PRN (last taken 03/21/2023)  Reproductive/Obstetrics                              Anesthesia Physical Anesthesia Plan  ASA: 3  Anesthesia Plan: General   Post-op Pain Management: Tylenol PO (pre-op)*   Induction: Intravenous  PONV Risk Score and Plan: 3 and Ondansetron, Dexamethasone and Treatment may vary due to age or medical condition  Airway Management Planned: Oral ETT  Additional Equipment:   Intra-op Plan:   Post-operative Plan: Extubation in OR  Informed Consent: I have reviewed the patients History and Physical, chart, labs and discussed the procedure including the risks, benefits and alternatives for the proposed anesthesia with the patient or authorized representative who has indicated his/her understanding and acceptance.     Dental advisory given  Plan Discussed with: CRNA and Anesthesiologist  Anesthesia Plan Comments: (Risks of general anesthesia discussed including, but not limited to, sore throat, hoarse voice, chipped/damaged teeth, injury to vocal cords, nausea and vomiting, allergic reactions, lung infection, heart attack, stroke, and death. All questions answered. )        Anesthesia Quick Evaluation

## 2023-04-11 NOTE — H&P (Signed)
CC: Here today for surgery  HPI: [name] is an 57 y.o. female with history of Hypothyroidism, lupus, psoriasis, rheumatoid arthritis, whom is seen in the office today as a referral by Dr. Janee Morn for evaluation of possible anal fistula.  Colonoscopy with Dr. Rhea Belton 01/2021: -Diverticulosis in sigmoid - Exam otherwise normal - No specimens. - Repeat colonoscopy in 10 years for screening purposes.  She saw my partner, Dr. Janee Morn, 01/26/2023 for evaluation. He was seeing her for a possible perianal cyst. He noted she had a history of lupus and psoriatic arthritis and has been taking prednisone and cosympic. She reports a many year history of intermittent left perianal lumps that will swell and then drain. Symptoms seem to be exacerbated if she is constipated.  She has never had any prior anorectal surgeries or procedures.  She did have a pelvic ultrasound 12/31/2022 with Dr. Rhea Belton that demonstrated a complex 2.7 cm subcutaneous fluid collection at the site of palpable abnormality, with sinus tract to the skin surface. This was done within the gluteal fold.  With regards to the perianal symptoms, she notes intermittent swelling and drainage that has occurred primarily since her colonoscopy was completed back in 2022. This will swell and drain somewhat intermittently, unpredictably. She denies any prior incision/drainage type procedures or any history of any anorectal surgery or procedures. She denies any known history of inflammatory bowel disease, Crohn's, or ulcerative colitis. She also denies any known family history of this. She herself does have multiple rheumatologic conditions including both rheumatoid arthritis, lupus, and psoriasis.  She denies any evident history of anal fissures or tearing like sensations with bowel movements.  She denies any changes in health or health history since we met in the office. No new medications/allergies. She states she is ready for surgery  today. Here with her son deShawn  PMH: Hypothyroidism, lupus, psoriasis, rheumatoid arthritis  PSH: Denies any prior anorectal surgeries or procedures.  FHx: Denies any known family history of colorectal, breast, endometrial or ovarian cancer. Denies any family history of inflammatory bowel disease, Crohn's, ulcerative colitis.  Social Hx: Denies use of tobacco/EtOH/illicit drug. 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   Past Medical History:  Diagnosis Date   Allergy    Anemia    Anxiety    Arthritis    ra, lupus - ankles, knees, hands, neck   Blood transfusion without reported diagnosis 69   [place] with c/s surgery   Bronchitis    Hx   Cancer (HCC)    nasopharynx   Chronic kidney disease    COPD (chronic obstructive pulmonary disease) (HCC)    DDD (degenerative disc disease), lumbar    Diabetes mellitus without complication (HCC)    related to prednisone use, no meds,cbg normal   Diverticulosis    Dysrhythmia    palpitations   Excessive or frequent menstruation    GERD (gastroesophageal reflux disease)    Headache(784.0)    History of kidney stones 2014   Hypertension    Hypothyroidism    Irregular menstrual cycle    Leiomyoma of uterus, unspecified    Lupus    Migraines    takes Topamax and Maxalt   Neuromuscular disorder (HCC)    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   Neuropathy    Papanicolaou smear of cervix with atypical squamous cells of undetermined significance (ASC-US)    Pneumonia    Psoriasis    Sinusitis    Trichimoniasis  Wears partial dentures    full upper and lower partial    Past Surgical History:  Procedure Laterality Date   ANKLE ARTHROSCOPY Right    CESAREAN SECTION     x 1   DILATION AND CURETTAGE OF UTERUS     x 1 Missed Abortion   ESOPHAGEAL DILATION     x 2   LYMPH NODE BIOPSY     neck   MULTIPLE EXTRACTIONS WITH ALVEOLOPLASTY N/A 03/05/2013   Procedure: MULTIPLE  EXTRACION WITH ALVEOLOPLASTY, extraction of decayed teeth numbers 3,4,5,18,19,22,23,24,25, extraction of retained root tips teeth numbers 2,20,21,26, bilateral mandibular alveoloplasty and upper right maxillary alveoloplasty;  Surgeon: Francene Finders, DDS;  Location: [facility] OR;  Service: Oral Surgery;  Laterality: N/A;   scalp biopsy     TOOTH EXTRACTION Bilateral 03/05/2013   Procedure: EXTRACTION MOLARS;  Surgeon: Francene Finders, DDS;  Location: [facility] OR;  Service: Oral Surgery;  Laterality: Bilateral;    Family History  Problem Relation Age of Onset   Hypertension Mother    Diabetes Mother    Hypertension Father    Lupus Brother    Breast cancer Maternal Grandmother 60   Colon cancer Neg Hx    Esophageal cancer Neg Hx    Rectal cancer Neg Hx    Stomach cancer Neg Hx    Colon polyps Neg Hx     Social:  reports that she quit smoking about 19 years ago. Her smoking use included cigarettes. She started smoking about 27 years ago. She has a 2 pack-year smoking history. She has never used smokeless tobacco. She reports that she does not currently use alcohol. She reports that she does not use drugs.  Allergies:  Allergies  Allergen Reactions   Morphine Itching   Amoxicillin Itching    Did it involve swelling of the face/tongue/throat, SOB, or low BP? No Did it involve sudden or severe rash/hives, skin peeling, or any reaction on the inside of your mouth or nose? Yes Did you need to seek medical attention at a hospital or doctor's office? Yes When did it last happen?      14 yrs ago If all above answers are "NO", may proceed with cephalosporin use.    Latex Itching   Percocet [Oxycodone-Acetaminophen] Itching   Plaquenil [Hydroxychloroquine Sulfate] Other (See Comments)    "Psoriasis."   Bactrim [Sulfamethoxazole-Trimethoprim] Rash   Ciprofloxacin Hcl Rash   Keflex [Cephalexin] Rash   Penicillins Rash    Did it involve swelling of the face/tongue/throat, SOB, or low BP?  No Did it involve sudden or severe rash/hives, skin peeling, or any reaction on the inside of your mouth or nose? Yes Did you need to seek medical attention at a hospital or doctor's office? Yes When did it last happen?      14 yrs ago If all above answers are "NO", may proceed with cephalosporin use.    Tetracyclines & Related Rash    Medications: I have reviewed the patient's current medications.  Results for orders placed or performed during the hospital encounter of 04/11/23 (from the past 48 hour(s))  Glucose, capillary     Status: Abnormal   Collection Time: 04/11/23 10:38 AM  Result Value Ref Range   Glucose-Capillary 101 (H) 70 - 99 mg/dL    Comment: Glucose reference range applies only to samples taken after fasting for at least 8 hours.   Comment 1 Notify RN    Comment 2 Document in Chart     No results  found.   PE Blood pressure 134/89, pulse 91, temperature 98.5 F (36.9 C), temperature source Oral, resp. rate 18, height 5\' 3"  (1.6 m), weight 73 kg, last menstrual period 06/25/2016, SpO2 96%. Constitutional: NAD; conversant Eyes: Moist conjunctiva; no lid lag; anicteric Lungs: Normal respiratory effort CV: RRR Psychiatric: Appropriate affect  Results for orders placed or performed during the hospital encounter of 04/11/23 (from the past 48 hour(s))  Glucose, capillary     Status: Abnormal   Collection Time: 04/11/23 10:38 AM  Result Value Ref Range   Glucose-Capillary 101 (H) 70 - 99 mg/dL    Comment: Glucose reference range applies only to samples taken after fasting for at least 8 hours.   Comment 1 Notify RN    Comment 2 Document in Chart     No results found.  A/P: Cladie Ramberg is an 57 y.o. 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 The pathophysiology of anal  abscess and fistula was discussed at length with associated pictures and illustrations using the [employer] trifold handout on anal abscess/fistula.  -With her other rheumatologic conditions, we did discuss etiologies for this with Crohn's being a certainly possible etiology but no evidence of this thus far with endoscopic evaluations.  -We have reviewed options going forward including further observation vs surgery -anorectal exam under anesthesia with surgical treatment of anal fistula-intersphincteric/transsphincteric with placement of seton versus fistulotomy based on intraoperative findings. -The planned procedure, material risks (including, but not limited to, pain, bleeding, infection, scarring, need for blood transfusion, damage to anal sphincter, incontinence of gas and/or stool, need for additional procedures, anal stenosis, rare cases of pelvic sepsis which in severe cases may require things like a colostomy, recurrence, pneumonia, heart attack, stroke, death) benefits and alternatives to surgery were discussed at length. We discussed expectations including if a seton were placed. We discussed that this would generally be a temporizing type maneuver. We did discuss the possibility of additional procedures approximately 3 months following if there is no clear evidence of Crohn's disease. We discussed what that may involve including ligation of intersphincteric fistulous tract or cases were something like an endorectal advancement flap may be considered. We discussed recurrence rates despite all this of approximately 20%. The patient's questions were answered to her satisfaction, she voiced understanding and elected to proceed with surgery. Additionally, we discussed typical postoperative expectations and the recovery process.   Marin Olp, MD [facility], [facility]

## 2023-04-12 ENCOUNTER — Encounter (HOSPITAL_COMMUNITY): Payer: Self-pay | Admitting: Surgery

## 2023-04-20 NOTE — Progress Notes (Deleted)
Referring:  Norm Salt, PA [address] [place],  [place] [address]  PCP: Norm Salt, PA  Neurology was asked to evaluate Keera Balmer, a 57 year old female for a chief complaint of headaches.  Our recommendations of care will be communicated by shared medical record.    CC:  headaches  History provided from self, sister  Follow-up visit:  Prior visit: 09/14/2022 with Dr. Delena Bali   Brief HPI:   Marylu Victoriano Vaughn is a 57 y.o. female with PMH of SLE, hypothyroidism, psoriasis, kidney stones, asthma, nasopharyngeal cancer s/p radiation and migraine headaches who was evaluated by Dr. Delena Bali on 09/14/2022 for evaluation of headaches gradually worsening over the past 2 months.  Does report 2 falls with headaches worsening 1.5 weeks after, did not have further evaluation after fall.  Reported daily headaches despite use of topiramate, use of rizatriptan with benefit.  At prior visit, recommended completion of MRI brain for worsening headaches after a fall and recommended initiation of Qulipta for prevention and continuation of rizatriptan for rescue.  She is also referred to PT for neck pain   Interval history:    Improvement in neck pain after working with PT but gradually returned.  She was referred to chiropractor.    The patient presents for evaluation of headaches. She has had migraines for several years, but they worsened 2 months ago after 2 falls. First time she tripped and fell on some weights and hit the right side of her head. The second time she took tizanidine and got dizzy, then fell and hit the left side of her head. Headaches began 1.5 weeks later. She is currently having headaches daily. They are getting worse over time. Headaches are associated with photophobia, phonophobia, nausea, and neck pain. They can last for 2-3 hours at a time.   She currently takes Maxalt as needed which does help for rescue. Has been taking Topamax for migraine prevention  for several years. She does have a history of kidney stones.  Headache History: Onset: 2 months ago Aura: blurry vision Location: forehead, occiput Quality/Description: pressure Associated Symptoms:  Photophobia: yes  Phonophobia: yes  Nausea: yes Worse with activity?: yes Duration of headaches: 2-3 hours  Migraine days per month: 30 Headache free days per month: 0  Current Treatment: Abortive Maxalt 10 mg PRN  Preventative Topamax 100 mg PRN  Prior Therapies                                 Rescue: Maxalt 10 mg PRN  Prevention: Topamax 100 mg QHS amitriptyline Cymbalta 30 mg daily Metoprolol 25 mg daily   LABS: CBC    Component Value Date/Time   WBC 7.5 12/31/2020 1542   RBC 3.85 (L) 12/31/2020 1542   HGB 11.2 (L) 12/31/2020 1542   HGB 12.4 10/14/2016 1151   HGB 10.6 (L) 03/25/2009 1245   HCT 34.4 (L) 12/31/2020 1542   HCT 38.7 10/14/2016 1151   HCT 30.9 (L) 03/25/2009 1245   PLT 290.0 12/31/2020 1542   PLT 331 10/14/2016 1151   MCV 89.4 12/31/2020 1542   MCV 93 10/14/2016 1151   MCV 93.1 03/25/2009 1245   MCH 25.7 (L) 11/19/2020 0000   MCHC 32.5 12/31/2020 1542   RDW 20.3 (H) 12/31/2020 1542   RDW 14.0 10/14/2016 1151   RDW 15.8 (H) 03/25/2009 1245   LYMPHSABS 1.4 12/31/2020 1542   LYMPHSABS 2.0 10/14/2016 1151  LYMPHSABS 1.6 03/25/2009 1245   MONOABS 0.4 12/31/2020 1542   MONOABS 0.5 03/25/2009 1245   EOSABS 0.1 12/31/2020 1542   EOSABS 0.1 10/14/2016 1151   BASOSABS 0.1 12/31/2020 1542   BASOSABS 0.0 10/14/2016 1151   BASOSABS 0.0 03/25/2009 1245      Latest Ref Rng & Units 11/19/2020   12:00 AM 11/21/2019    2:50 PM 07/19/2019    2:05 PM  CMP  Glucose 65 - 99 mg/dL 98  78  88   BUN 7 - 25 mg/dL 13  15  14    Creatinine 0.50 - 1.05 mg/dL 2.84  1.32  4.40   Sodium 135 - 146 mmol/L 143  139  143   Potassium 3.5 - 5.3 mmol/L 3.9  4.0  3.9   Chloride 98 - 110 mmol/L 106  104  107   CO2 20 - 32 mmol/L 22  26  28    Calcium 8.6 - 10.4 mg/dL 9.4   9.3  9.3   Total Protein 6.1 - 8.1 g/dL 7.7  7.4  7.3   Total Bilirubin 0.2 - 1.2 mg/dL 0.3  0.3  0.3   AST 10 - 35 U/L 18  15  14    ALT 6 - 29 U/L 10  8  8       IMAGING:  [facility] 01/17/2019: unremarkable  Imaging independently reviewed on April 20, 2023   Current Outpatient Medications on File Prior to Visit  Medication Sig Dispense Refill   albuterol (PROVENTIL HFA;VENTOLIN HFA) 108 (90 BASE) MCG/ACT inhaler Inhale 2 puffs into the lungs every 6 (six) hours as needed for wheezing or shortness of breath.      albuterol (PROVENTIL) (2.5 MG/3ML) 0.083% nebulizer solution Take 2.5 mg by nebulization every 6 (six) hours as needed for wheezing or shortness of breath. Reported on 10/27/2015     Ascorbic Acid (VITAMIN C) 1000 MG tablet Take 1,000 mg by mouth 2 (two) times daily.     calcium-vitamin D (OSCAL WITH D) 500-200 MG-UNIT per tablet Take 1 tablet by mouth 2 (two) times daily.     cetirizine (ZYRTEC) 10 MG tablet Take 10 mg by mouth daily.     COSENTYX 300 DOSE 150 MG/ML SOSY Inject 300 mg into the muscle every 28 (twenty-eight) days.      diclofenac (VOLTAREN) 75 MG EC tablet Take 1 tablet (75 mg total) by mouth 2 (two) times daily as needed (for neck pain and headaches). 30 tablet 3   ferrous sulfate 325 (65 FE) MG tablet Take 325 mg by mouth daily.     furosemide (LASIX) 40 MG tablet Take 40 mg by mouth daily as needed for fluid or edema.      gabapentin (NEURONTIN) 100 MG capsule Take 100 mg by mouth 2 (two) times daily.     glucosamine-chondroitin 500-400 MG tablet Take 1 tablet by mouth 2 (two) times daily.     hydrOXYzine (ATARAX/VISTARIL) 10 MG tablet Take 10 mg by mouth at bedtime as needed for itching or anxiety. Reported on 10/27/2015     levothyroxine (SYNTHROID, LEVOTHROID) 75 MCG tablet Take 75 mcg by mouth daily.     metoprolol succinate (TOPROL-XL) 50 MG 24 hr tablet Take 1 tablet by mouth daily.     metroNIDAZOLE (FLAGYL) 500 MG tablet Take 1 tablet (500 mg total) by mouth 2  (two) times daily. (Patient not taking: Reported on 04/04/2023) 14 tablet 0   nitroGLYCERIN (NITROSTAT) 0.4 MG SL tablet Place 1 tablet (0.4 mg  total) under the tongue every 5 (five) minutes as needed for chest pain. 25 tablet 3   pantoprazole (PROTONIX) 40 MG tablet Take 40 mg by mouth daily.      potassium chloride (K-DUR) 10 MEQ tablet Take 1 tablet by mouth daily.     predniSONE (DELTASONE) 20 MG tablet Take 20 mg by mouth daily as needed.     rizatriptan (MAXALT-MLT) 10 MG disintegrating tablet Take 10 mg by mouth as needed for migraine.   5   SYMBICORT 160-4.5 MCG/ACT inhaler Inhale 2 puffs into the lungs 2 (two) times daily.  5   topiramate (TOPAMAX) 100 MG tablet Take 100 mg by mouth at bedtime.      triamcinolone cream (KENALOG) 0.1 % Apply topically 2 (two) times daily as needed.     VITAMIN E PO Take 400 mg by mouth 2 (two) times daily.      Current Facility-Administered Medications on File Prior to Visit  Medication Dose Route Frequency Provider Last Rate Last Admin   0.9 %  sodium chloride infusion  500 mL Intravenous Once Pyrtle, Carie Caddy, MD         Allergies: Allergies  Allergen Reactions   Morphine Itching   Amoxicillin Itching    Did it involve swelling of the face/tongue/throat, SOB, or low BP? No Did it involve sudden or severe rash/hives, skin peeling, or any reaction on the inside of your mouth or nose? Yes Did you need to seek medical attention at a hospital or doctor's office? Yes When did it last happen?      14 yrs ago If all above answers are "NO", may proceed with cephalosporin use.    Latex Itching   Percocet [Oxycodone-Acetaminophen] Itching   Plaquenil [Hydroxychloroquine Sulfate] Other (See Comments)    "Psoriasis."   Bactrim [Sulfamethoxazole-Trimethoprim] Rash   Ciprofloxacin Hcl Rash   Keflex [Cephalexin] Rash   Penicillins Rash    Did it involve swelling of the face/tongue/throat, SOB, or low BP? No Did it involve sudden or severe rash/hives, skin  peeling, or any reaction on the inside of your mouth or nose? Yes Did you need to seek medical attention at a hospital or doctor's office? Yes When did it last happen?      14 yrs ago If all above answers are "NO", may proceed with cephalosporin use.    Tetracyclines & Related Rash    Family History: Family History  Problem Relation Age of Onset   Hypertension Mother    Diabetes Mother    Hypertension Father    Lupus Brother    Breast cancer Maternal Grandmother 42   Colon cancer Neg Hx    Esophageal cancer Neg Hx    Rectal cancer Neg Hx    Stomach cancer Neg Hx    Colon polyps Neg Hx      Past Medical History: Past Medical History:  Diagnosis Date   Allergy    Anemia    Anxiety    Arthritis    ra, lupus - ankles, knees, hands, neck   Blood transfusion without reported diagnosis 39   [place] with c/s surgery   Bronchitis    Hx   Cancer (HCC)    nasopharynx   Chronic kidney disease    COPD (chronic obstructive pulmonary disease) (HCC)    DDD (degenerative disc disease), lumbar    Diabetes mellitus without complication (HCC)    related to prednisone use, no meds,cbg normal   Diverticulosis    Dysrhythmia  palpitations   Excessive or frequent menstruation    GERD (gastroesophageal reflux disease)    Headache(784.0)    History of kidney stones 2014   Hypertension    Hypothyroidism    Irregular menstrual cycle    Leiomyoma of uterus, unspecified    Lupus    Migraines    takes Topamax and Maxalt   Neuromuscular disorder (HCC)    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   Neuropathy    Papanicolaou smear of cervix with atypical squamous cells of undetermined significance (ASC-US)    Pneumonia    Psoriasis    Sinusitis    Trichimoniasis    Wears partial dentures    full upper and lower partial    Past Surgical History Past Surgical History:  Procedure Laterality Date   ANKLE ARTHROSCOPY Right    CESAREAN SECTION     x 1   DILATION AND CURETTAGE OF UTERUS     x  1 Missed Abortion   ESOPHAGEAL DILATION     x 2   LYMPH NODE BIOPSY     neck   MULTIPLE EXTRACTIONS WITH ALVEOLOPLASTY N/A 03/05/2013   Procedure: MULTIPLE EXTRACION WITH ALVEOLOPLASTY, extraction of decayed teeth numbers 3,4,5,18,19,22,23,24,25, extraction of retained root tips teeth numbers 2,20,21,26, bilateral mandibular alveoloplasty and upper right maxillary alveoloplasty;  Surgeon: Francene Finders, DDS;  Location: [facility] OR;  Service: Oral Surgery;  Laterality: N/A;   PLACEMENT OF SETON N/A 04/11/2023   Procedure: PLACEMENT OF DRAINING  SETON;  Surgeon: Andria Meuse, MD;  Location: [facility] ORS;  Service: General;  Laterality: N/A;   scalp biopsy     TOOTH EXTRACTION Bilateral 03/05/2013   Procedure: EXTRACTION MOLARS;  Surgeon: Francene Finders, DDS;  Location: [facility] OR;  Service: Oral Surgery;  Laterality: Bilateral;    Social History: Social History   Tobacco Use   Smoking status: Former    Current packs/day: 0.00    Average packs/day: 0.3 packs/day for 8.0 years (2.0 ttl pk-yrs)    Types: Cigarettes    Start date: 02/28/1996    Quit date: 02/28/2004    Years since quitting: 19.1   Smokeless tobacco: Never  Vaping Use   Vaping status: Never Used  Substance Use Topics   Alcohol use: Not Currently    Alcohol/week: 0.0 standard drinks of alcohol   Drug use: No    ROS: Negative for fevers, chills. Positive for headaches. All other systems reviewed and negative unless stated otherwise in HPI.   Physical Exam:   Vital Signs: LMP 06/25/2016  GENERAL: well appearing,in no acute distress,alert SKIN:  Color, texture, turgor normal. No rashes or lesions HEAD:  Normocephalic/atraumatic. CV:  RRR RESP: Normal respiratory effort MSK: +tenderness to palpation over L>R occiput, neck, and shoulders  NEUROLOGICAL: Mental Status: Alert, oriented to person, place and time,Follows commands Cranial Nerves: PERRL, visual fields intact to confrontation, extraocular movements  intact, facial sensation intact, no facial droop or ptosis, hearing grossly intact, no dysarthria Motor: muscle strength 5/5 both upper and lower extremities Reflexes: 2+ throughout Sensation: intact to light touch all 4 extremities Coordination: Finger-to- nose-finger intact bilaterally Gait: normal-based   IMPRESSION: 57 year old female with a history of SLE, hypothyroidism, psoriasis, kidney stones, asthma, nasopharyngeal cancer s/p radiation who returns for follow-up of worsening headaches since around 06/2022. She did have 2 falls in that time with hitting head and had not sought evaluation until initial visit with Dr. Delena Bali in 01/2023.  MRI brain unremarkable.   Will order brain MRI given  continued worsening of headaches and history of head/neck cancer s/p radiation. She continues to have daily headaches despite Topamax treatment. Prefers to avoid injections if possible. Will start Qulipta for migraine prevention. Ideally would be able to wean off of Topamax if headaches stabilize as she does have a history of kidney stones. Will continue Maxalt for rescue. Referral to PT placed for neck pain.   PLAN: -MRI brain -Prevention: Start Qulipta 60 mg daily, continue Topamax 100 mg QHS for now -Rescue: Continue Maxalt 10 mg PRN -Referral to neck PT -Next steps: consider CGRP (she is OK with a pen injector if needed)     I spent *** minutes of face-to-face and non-face-to-face time with patient.  This included previsit chart review, lab review, study review, order entry, electronic health record documentation, patient education and discussion regarding above diagnoses and treatment plan and answered all other questions to patient's satisfaction  Ihor Austin, [facility]  [facility] [address] [place], [place] [address]  Phone [phone] Fax [phone] Note: This document was prepared with digital dictation and possible smart phrase technology. Any  transcriptional errors that result from this process are unintentional.

## 2023-04-21 ENCOUNTER — Ambulatory Visit: Payer: 59 | Admitting: Adult Health

## 2023-04-26 DIAGNOSIS — E559 Vitamin D deficiency, unspecified: Secondary | ICD-10-CM | POA: Diagnosis not present

## 2023-04-26 DIAGNOSIS — K219 Gastro-esophageal reflux disease without esophagitis: Secondary | ICD-10-CM | POA: Diagnosis not present

## 2023-04-26 DIAGNOSIS — G629 Polyneuropathy, unspecified: Secondary | ICD-10-CM | POA: Diagnosis not present

## 2023-04-26 DIAGNOSIS — E782 Mixed hyperlipidemia: Secondary | ICD-10-CM | POA: Diagnosis not present

## 2023-04-26 DIAGNOSIS — J302 Other seasonal allergic rhinitis: Secondary | ICD-10-CM | POA: Diagnosis not present

## 2023-04-26 DIAGNOSIS — M329 Systemic lupus erythematosus, unspecified: Secondary | ICD-10-CM | POA: Diagnosis not present

## 2023-04-26 DIAGNOSIS — E039 Hypothyroidism, unspecified: Secondary | ICD-10-CM | POA: Diagnosis not present

## 2023-04-26 DIAGNOSIS — L4050 Arthropathic psoriasis, unspecified: Secondary | ICD-10-CM | POA: Diagnosis not present

## 2023-04-26 DIAGNOSIS — Z8709 Personal history of other diseases of the respiratory system: Secondary | ICD-10-CM | POA: Diagnosis not present

## 2023-04-26 DIAGNOSIS — I10 Essential (primary) hypertension: Secondary | ICD-10-CM | POA: Diagnosis not present

## 2023-04-26 DIAGNOSIS — G43909 Migraine, unspecified, not intractable, without status migrainosus: Secondary | ICD-10-CM | POA: Diagnosis not present

## 2023-04-26 DIAGNOSIS — R2689 Other abnormalities of gait and mobility: Secondary | ICD-10-CM | POA: Diagnosis not present

## 2023-05-25 ENCOUNTER — Telehealth: Payer: Self-pay | Admitting: Psychiatry

## 2023-05-25 NOTE — Telephone Encounter (Signed)
Reviewed chart. She cx appt 03/10/23 d/t no transportation. 03/30/23 was cx because she scheduled the sooner appt on 03/10/23.   She cx 04/21/23 appt wanting to come later per notes.

## 2023-05-25 NOTE — Telephone Encounter (Signed)
Pt has called wanting to schedule an appt for nose bleeds and falls. Pt has not been seen here for these symptoms and pt is refusing to call for another referral and then changed her story to she wants to come in for migraines. When I checked what the next available appt  was, in June, she became angered and started saying that I need to find her someone that can see her now I asked her if she would like me to check on another NP's schedule and she started yelling HELLLOOOOO are you not listening I need an appt now. I explained that I will send a message to the nurse and she would receive a call back. She mumbled under her breath and call was disconnected.

## 2023-05-26 NOTE — Telephone Encounter (Signed)
Pt returned phone call,

## 2023-05-26 NOTE — Telephone Encounter (Signed)
LVM for pt to call back.

## 2023-05-26 NOTE — Telephone Encounter (Signed)
Called pt again. Call got forwarded to VM. I LVM for pt to call office.

## 2023-05-26 NOTE — Telephone Encounter (Signed)
Called pt. Offered appt this afternoon w/ Jessica at 2:15pm. She states it was too last min, unable to take. Aware we did try to call her twice this am and appreciative.   Scheduled for 05/30/23 at 11:15am instead w/ Shanda Bumps.

## 2023-05-30 ENCOUNTER — Ambulatory Visit (INDEPENDENT_AMBULATORY_CARE_PROVIDER_SITE_OTHER): Payer: 59 | Admitting: Adult Health

## 2023-05-30 ENCOUNTER — Encounter: Payer: Self-pay | Admitting: Adult Health

## 2023-05-30 VITALS — BP 144/86 | HR 91 | Ht 65.0 in | Wt 165.0 lb

## 2023-05-30 DIAGNOSIS — Z8589 Personal history of malignant neoplasm of other organs and systems: Secondary | ICD-10-CM | POA: Diagnosis not present

## 2023-05-30 DIAGNOSIS — M542 Cervicalgia: Secondary | ICD-10-CM | POA: Diagnosis not present

## 2023-05-30 DIAGNOSIS — R519 Headache, unspecified: Secondary | ICD-10-CM | POA: Diagnosis not present

## 2023-05-30 NOTE — Patient Instructions (Addendum)
Your Plan:   Referral placed to [facility] as requested  You will be called to schedule an MRI brain   Please follow up with rheumatology for possible side effects from methotrexate   Please follow up with PCP regarding nose bleeds      Follow up with MD in 4-6 months     Thank you for coming to see [place] at [facility]. I hope we have been able to provide you high quality care today.  You may receive a patient satisfaction survey over the next few weeks. We would appreciate your feedback and comments so that we may continue to improve ourselves and the health of our patients.

## 2023-05-30 NOTE — Progress Notes (Signed)
Referring:  Norm Salt, PA [address] [place],  [place] [address]  PCP: Norm Salt, PA   CC:  headaches  History provided from self  Follow-up visit:  Prior visit: 09/14/2022 with Dr. Delena Bali (initial consult visit)  Brief HPI:   Denitra Ciak is a 57 y.o. female with PMH of SLE, hypothyroidism, psoriasis, kidney stones, asthma, nasopharyngeal cancer s/p radiation who was evaluated by Dr. Delena Bali on 09/14/2022 with chronic migraine headaches worsening over the past 2 months after 2 falls.   At initial visit, recommended completion of MRI brain, started on Qulipta for prevention in addition to topiramate and continued on rizatriptan for rescue.  She was referred to PT and started Flexeril as needed for neck pain.  MRI brain 08/2022 largely unremarkable, very minimal chronic microvascular ischemic changes noted and s/p postadenoidectomy without abnormal enhancement.  During the interval time, patient called office in August requesting a referral to a chiropractor due to return of back pain shortly after completing PT, she also requested oral diclofenac as no benefit with Flexeril     Interval history:  Patient returns for follow up visit per patient request to follow up on migraines. She apparently has not had any recent migraine headaches, she never started [place] as migraines improved at PT for neck, and ongoing use of metoprolol and Topamax.   She reports right sided head pain. At first, pain started just a couple days ago and questioned if due to methotrexate as this was recently started, she has also been having nose bleeds and questions if this could be a side effects. She then changed her story, stated that she has been having this right sided head pain on and off since her fall back in January, right above her eyebrow bone, worse when laying on side, previously affecting left side as well but has not had any recent left sided symptoms. She reports  occasional right facial swelling upon awakening. No pain in cheek or jaw. Denies vision changes.  Denies other triggers such as cold weather, touching of face, chewing, talking, etc. She requested repeat MRI brain due to new symptoms which was further questioned as this was reported symptoms present over almost the past year, she then changed her story again stating these symptoms only present over the past 2 months and now worse/persistent over the past 2 days. Can radiate up from right side of neck. She does have chronic neck pain, she requests referral again to chiropractor as she did not follow through with prior chiropractor referral due to location it was sent to. She again mentions concerns regarding nose bleeds which have bene present over the past month, has not yet had follow up with PCP regarding this, she questions if this could be a side effect of methotrexate as well but has not reached out to rheumatology regarding this concern.        LABS: CBC    Component Value Date/Time   WBC 7.5 12/31/2020 1542   RBC 3.85 (L) 12/31/2020 1542   HGB 11.2 (L) 12/31/2020 1542   HGB 12.4 10/14/2016 1151   HGB 10.6 (L) 03/25/2009 1245   HCT 34.4 (L) 12/31/2020 1542   HCT 38.7 10/14/2016 1151   HCT 30.9 (L) 03/25/2009 1245   PLT 290.0 12/31/2020 1542   PLT 331 10/14/2016 1151   MCV 89.4 12/31/2020 1542   MCV 93 10/14/2016 1151   MCV 93.1 03/25/2009 1245   MCH 25.7 (L) 11/19/2020 0000   MCHC  32.5 12/31/2020 1542   RDW 20.3 (H) 12/31/2020 1542   RDW 14.0 10/14/2016 1151   RDW 15.8 (H) 03/25/2009 1245   LYMPHSABS 1.4 12/31/2020 1542   LYMPHSABS 2.0 10/14/2016 1151   LYMPHSABS 1.6 03/25/2009 1245   MONOABS 0.4 12/31/2020 1542   MONOABS 0.5 03/25/2009 1245   EOSABS 0.1 12/31/2020 1542   EOSABS 0.1 10/14/2016 1151   BASOSABS 0.1 12/31/2020 1542   BASOSABS 0.0 10/14/2016 1151   BASOSABS 0.0 03/25/2009 1245      Latest Ref Rng & Units 11/19/2020   12:00 AM 11/21/2019    2:50 PM 07/19/2019     2:05 PM  CMP  Glucose 65 - 99 mg/dL 98  78  88   BUN 7 - 25 mg/dL 13  15  14    Creatinine 0.50 - 1.05 mg/dL 8.75  6.43  3.29   Sodium 135 - 146 mmol/L 143  139  143   Potassium 3.5 - 5.3 mmol/L 3.9  4.0  3.9   Chloride 98 - 110 mmol/L 106  104  107   CO2 20 - 32 mmol/L 22  26  28    Calcium 8.6 - 10.4 mg/dL 9.4  9.3  9.3   Total Protein 6.1 - 8.1 g/dL 7.7  7.4  7.3   Total Bilirubin 0.2 - 1.2 mg/dL 0.3  0.3  0.3   AST 10 - 35 U/L 18  15  14    ALT 6 - 29 U/L 10  8  8       IMAGING:   MR brain w/wo contrast 09/16/2022 IMPRESSION: This MRI of the brain with and without contrast shows the following: Two T2/FLAIR hypertense foci in the subcortical or deep white matter.  This is a nonspecific finding and is most consistent with age-appropriate very minimal chronic microvascular ischemic change. The nasopharynx shows that she is status postadenoidectomy.  No abnormal enhancement is noted. Normal enhancement pattern.  No acute findings.   [facility] 01/17/2019: unremarkable     Current Outpatient Medications on File Prior to Visit  Medication Sig Dispense Refill   albuterol (PROVENTIL HFA;VENTOLIN HFA) 108 (90 BASE) MCG/ACT inhaler Inhale 2 puffs into the lungs every 6 (six) hours as needed for wheezing or shortness of breath.      albuterol (PROVENTIL) (2.5 MG/3ML) 0.083% nebulizer solution Take 2.5 mg by nebulization every 6 (six) hours as needed for wheezing or shortness of breath. Reported on 10/27/2015     Ascorbic Acid (VITAMIN C) 1000 MG tablet Take 1,000 mg by mouth 2 (two) times daily.     calcium-vitamin D (OSCAL WITH D) 500-200 MG-UNIT per tablet Take 1 tablet by mouth 2 (two) times daily.     cetirizine (ZYRTEC) 10 MG tablet Take 10 mg by mouth daily.     COSENTYX 300 DOSE 150 MG/ML SOSY Inject 300 mg into the muscle every 28 (twenty-eight) days.      diclofenac (VOLTAREN) 75 MG EC tablet Take 1 tablet (75 mg total) by mouth 2 (two) times daily as needed (for neck pain and  headaches). 30 tablet 3   ferrous sulfate 325 (65 FE) MG tablet Take 325 mg by mouth daily.     furosemide (LASIX) 40 MG tablet Take 40 mg by mouth daily as needed for fluid or edema.      gabapentin (NEURONTIN) 100 MG capsule Take 100 mg by mouth 2 (two) times daily.     glucosamine-chondroitin 500-400 MG tablet Take 1 tablet by mouth 2 (two) times daily.  hydrOXYzine (ATARAX/VISTARIL) 10 MG tablet Take 10 mg by mouth at bedtime as needed for itching or anxiety. Reported on 10/27/2015     levothyroxine (SYNTHROID, LEVOTHROID) 75 MCG tablet Take 75 mcg by mouth daily.     metoprolol succinate (TOPROL-XL) 50 MG 24 hr tablet Take 1 tablet by mouth daily.     pantoprazole (PROTONIX) 40 MG tablet Take 40 mg by mouth daily.      potassium chloride (K-DUR) 10 MEQ tablet Take 1 tablet by mouth daily.     predniSONE (DELTASONE) 20 MG tablet Take 20 mg by mouth daily as needed.     rizatriptan (MAXALT-MLT) 10 MG disintegrating tablet Take 10 mg by mouth as needed for migraine.   5   SYMBICORT 160-4.5 MCG/ACT inhaler Inhale 2 puffs into the lungs 2 (two) times daily.  5   topiramate (TOPAMAX) 100 MG tablet Take 100 mg by mouth at bedtime.      triamcinolone cream (KENALOG) 0.1 % Apply topically 2 (two) times daily as needed.     VITAMIN E PO Take 400 mg by mouth 2 (two) times daily.      metroNIDAZOLE (FLAGYL) 500 MG tablet Take 1 tablet (500 mg total) by mouth 2 (two) times daily. (Patient not taking: Reported on 04/04/2023) 14 tablet 0   nitroGLYCERIN (NITROSTAT) 0.4 MG SL tablet Place 1 tablet (0.4 mg total) under the tongue every 5 (five) minutes as needed for chest pain. 25 tablet 3   Current Facility-Administered Medications on File Prior to Visit  Medication Dose Route Frequency Provider Last Rate Last Admin   0.9 %  sodium chloride infusion  500 mL Intravenous Once Pyrtle, Carie Caddy, MD         Allergies: Allergies  Allergen Reactions   Morphine Itching   Amoxicillin Itching    Did it  involve swelling of the face/tongue/throat, SOB, or low BP? No Did it involve sudden or severe rash/hives, skin peeling, or any reaction on the inside of your mouth or nose? Yes Did you need to seek medical attention at a hospital or doctor's office? Yes When did it last happen?      14 yrs ago If all above answers are "NO", may proceed with cephalosporin use.    Latex Itching   Percocet [Oxycodone-Acetaminophen] Itching   Plaquenil [Hydroxychloroquine Sulfate] Other (See Comments)    "Psoriasis."   Bactrim [Sulfamethoxazole-Trimethoprim] Rash   Ciprofloxacin Hcl Rash   Keflex [Cephalexin] Rash   Penicillins Rash    Did it involve swelling of the face/tongue/throat, SOB, or low BP? No Did it involve sudden or severe rash/hives, skin peeling, or any reaction on the inside of your mouth or nose? Yes Did you need to seek medical attention at a hospital or doctor's office? Yes When did it last happen?      14 yrs ago If all above answers are "NO", may proceed with cephalosporin use.    Tetracyclines & Related Rash    Family History: Family History  Problem Relation Age of Onset   Hypertension Mother    Diabetes Mother    Hypertension Father    Lupus Brother    Breast cancer Maternal Grandmother 99   Colon cancer Neg Hx    Esophageal cancer Neg Hx    Rectal cancer Neg Hx    Stomach cancer Neg Hx    Colon polyps Neg Hx      Past Medical History: Past Medical History:  Diagnosis Date   Allergy  Anemia    Anxiety    Arthritis    ra, lupus - ankles, knees, hands, neck   Blood transfusion without reported diagnosis 68   [place] with c/s surgery   Bronchitis    Hx   Cancer (HCC)    nasopharynx   Chronic kidney disease    COPD (chronic obstructive pulmonary disease) (HCC)    DDD (degenerative disc disease), lumbar    Diabetes mellitus without complication (HCC)    related to prednisone use, no meds,cbg normal   Diverticulosis    Dysrhythmia    palpitations    Excessive or frequent menstruation    GERD (gastroesophageal reflux disease)    Headache(784.0)    History of kidney stones 2014   Hypertension    Hypothyroidism    Irregular menstrual cycle    Leiomyoma of uterus, unspecified    Lupus    Migraines    takes Topamax and Maxalt   Neuromuscular disorder (HCC)    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   Neuropathy    Papanicolaou smear of cervix with atypical squamous cells of undetermined significance (ASC-US)    Pneumonia    Psoriasis    Sinusitis    Trichimoniasis    Wears partial dentures    full upper and lower partial    Past Surgical History Past Surgical History:  Procedure Laterality Date   ANKLE ARTHROSCOPY Right    CESAREAN SECTION     x 1   DILATION AND CURETTAGE OF UTERUS     x 1 Missed Abortion   ESOPHAGEAL DILATION     x 2   LYMPH NODE BIOPSY     neck   MULTIPLE EXTRACTIONS WITH ALVEOLOPLASTY N/A 03/05/2013   Procedure: MULTIPLE EXTRACION WITH ALVEOLOPLASTY, extraction of decayed teeth numbers 3,4,5,18,19,22,23,24,25, extraction of retained root tips teeth numbers 2,20,21,26, bilateral mandibular alveoloplasty and upper right maxillary alveoloplasty;  Surgeon: Francene Finders, DDS;  Location: [facility] OR;  Service: Oral Surgery;  Laterality: N/A;   PLACEMENT OF SETON N/A 04/11/2023   Procedure: PLACEMENT OF DRAINING  SETON;  Surgeon: Andria Meuse, MD;  Location: [facility] ORS;  Service: General;  Laterality: N/A;   scalp biopsy     TOOTH EXTRACTION Bilateral 03/05/2013   Procedure: EXTRACTION MOLARS;  Surgeon: Francene Finders, DDS;  Location: [facility] OR;  Service: Oral Surgery;  Laterality: Bilateral;    Social History: Social History   Tobacco Use   Smoking status: Former    Current packs/day: 0.00    Average packs/day: 0.3 packs/day for 8.0 years (2.0 ttl pk-yrs)    Types: Cigarettes    Start date: 02/28/1996    Quit date: 02/28/2004    Years since quitting: 19.2   Smokeless tobacco: Never  Vaping Use   Vaping  status: Never Used  Substance Use Topics   Alcohol use: Not Currently    Alcohol/week: 0.0 standard drinks of alcohol   Drug use: No    ROS: Negative for fevers, chills. Positive for head pain, neck pain, nose bleeds. All other systems reviewed and negative unless stated otherwise in HPI.   Physical Exam:   Vital Signs: BP (!) 144/86 (BP Location: Left Arm, Patient Position: Sitting, Cuff Size: Small)   Pulse 91   Ht 5\' 5"  (1.651 m)   Wt 165 lb (74.8 kg)   LMP 06/25/2016   BMI 27.46 kg/m  GENERAL: well appearing,in no acute distress,alert  NEUROLOGICAL: Mental Status: Alert, oriented to person, place and time,Follows commands Cranial Nerves: PERRL, visual fields intact  to confrontation, extraocular movements intact, facial sensation intact, no facial droop or ptosis, hearing grossly intact, no dysarthria Motor: muscle strength 5/5 both upper and lower extremities Reflexes: 2+ throughout Sensation: intact to light touch all 4 extremities Coordination: Finger-to- nose-finger intact bilaterally Gait: normal-based     IMPRESSION: 57 year old female with a history of SLE, hypothyroidism, psoriasis, kidney stones, asthma, nasopharyngeal cancer s/p radiation who returns for follow-up of worsening headaches since around 06/2022 after a fall but apparently scheduled todays visit to discuss new onset right sided head pain and nose bleeds. Difficulty getting clear history of head pain symptoms, unclear if present post fall almost 1 year ago or new onset over the past 2 months or onset over 2 months now with worsening over the past couple of days. Also c/o nose bleeds of unclear source. She also has neck pain which may be contributing. No recent migraines on topiramate and metoprolol.    PLAN: -repeat MRI brain for reassurance per patient request due to new onset headaches  -referral placed to chiropractor per patients request for continued cerivalgia  -advised needs f/u with PCP for  nose bleed evaluation, advised to f/u with rheumatology regarding possible side effects from methotrexate -Continue topiramate and metoprolol for migraine prophylaxis and rizatriptan for rescue managed/prescribed by PCP    Follow-up in 4 to 6 months with MD to establish care as prior patient of Dr. Delena Bali, if remains stable at tha time can likely follow up as needed.     I spent 40 minutes of face-to-face and non-face-to-face time with patient.  This included previsit chart review, lab review, study review, order entry, electronic health record documentation, patient education and discussion regarding above diagnoses and treatment plan and answered all other questions to patient's satisfaction  Ihor Austin, [facility]  [facility] [address] [place], [place] [address]  Phone [phone] Fax [phone] Note: This document was prepared with digital dictation and possible smart phrase technology. Any transcriptional errors that result from this process are unintentional.

## 2023-06-02 ENCOUNTER — Telehealth: Payer: Self-pay | Admitting: Adult Health

## 2023-06-02 NOTE — Telephone Encounter (Signed)
[employer] medicare/[place] medicaid NPR sent to GI [phone] 

## 2023-06-06 ENCOUNTER — Telehealth: Payer: Self-pay | Admitting: Adult Health

## 2023-06-06 DIAGNOSIS — M542 Cervicalgia: Secondary | ICD-10-CM

## 2023-06-06 NOTE — Telephone Encounter (Signed)
Referral for chiropractic fax to [facility]. Phone: [phone], Fax: [phone].

## 2023-06-23 NOTE — Telephone Encounter (Signed)
 Pt states she reached out to [facility]. They have not received the referral, pt asking if it can be sent to [facility] in [place] on [address].

## 2023-06-23 NOTE — Telephone Encounter (Signed)
 Pt requesting referral for Chiropractic resent to [facility] in [place]. Would I need a new referral?

## 2023-06-23 NOTE — Telephone Encounter (Signed)
 Pt requested referral for [facility]ropractic fax [facility]. Phone: [phone],Fax: [phone]

## 2023-07-07 ENCOUNTER — Encounter (HOSPITAL_BASED_OUTPATIENT_CLINIC_OR_DEPARTMENT_OTHER): Payer: Self-pay | Admitting: Surgery

## 2023-07-07 ENCOUNTER — Ambulatory Visit: Payer: Self-pay | Admitting: Surgery

## 2023-07-07 DIAGNOSIS — Z01818 Encounter for other preprocedural examination: Secondary | ICD-10-CM

## 2023-07-12 ENCOUNTER — Encounter (HOSPITAL_BASED_OUTPATIENT_CLINIC_OR_DEPARTMENT_OTHER): Payer: Self-pay | Admitting: Surgery

## 2023-07-13 ENCOUNTER — Encounter (HOSPITAL_BASED_OUTPATIENT_CLINIC_OR_DEPARTMENT_OTHER): Payer: Self-pay | Admitting: Surgery

## 2023-07-13 NOTE — Progress Notes (Signed)
Spoke w/ via phone for pre-op interview--- pt Lab needs dos----  CBCdiff,  BMP       Lab results------ current EKG in epic/ chart COVID test -----patient states asymptomatic no test needed Arrive at -------  0700 on 07-14-2023 NPO after MN w/ exception sips of water w/ meds Med rec completed Medications to take morning of surgery ----- gabapentin, toprol, synthroid, protonix, symbicort inhaler Diabetic medication ----- n/a Patient instructed no nail polish to be worn day of surgery Patient instructed to bring photo id and insurance card day of surgery Patient aware to have Driver (ride ) / caregiver    for 24 hours after surgery - son, deShawn Patient Special Instructions ----- asked to bring rescue inhaler with her dos Pre-Op special Instructions ----- n/a Patient verbalized understanding of instructions that were given at this phone interview. Patient denies chest pain, sob, fever, cough at the interview.

## 2023-07-14 DIAGNOSIS — Z01818 Encounter for other preprocedural examination: Secondary | ICD-10-CM

## 2023-07-14 NOTE — Anesthesia Preprocedure Evaluation (Addendum)
Anesthesia Evaluation  Patient identified by MRN, date of birth, ID band Patient awake    Reviewed: Allergy & Precautions, H&P , NPO status , Patient's Chart, lab work & pertinent test results, reviewed documented beta blocker date and time   Airway Mallampati: II  TM Distance: >3 FB Neck ROM: Full    Dental no notable dental hx.    Pulmonary shortness of breath, asthma , former smoker   Pulmonary exam normal        Cardiovascular hypertension, Pt. on medications and Pt. on home beta blockers  Rhythm:Regular Rate:Normal     Neuro/Psych  Headaches  Anxiety        GI/Hepatic Neg liver ROS, hiatal hernia,GERD  Medicated,,  Endo/Other  diabetesHypothyroidism    Renal/GU negative Renal ROS  negative genitourinary   Musculoskeletal  (+) Arthritis ,    Abdominal   Peds  Hematology  (+) Blood dyscrasia, anemia   Anesthesia Other Findings   Reproductive/Obstetrics negative OB ROS                             Anesthesia Physical Anesthesia Plan  ASA: 3  Anesthesia Plan: General   Post-op Pain Management: Tylenol PO (pre-op)* and Toradol IV (intra-op)*   Induction: Intravenous  PONV Risk Score and Plan: 4 or greater and Ondansetron, Dexamethasone and Midazolam  Airway Management Planned: Oral ETT  Additional Equipment:   Intra-op Plan:   Post-operative Plan: Extubation in OR  Informed Consent: I have reviewed the patients History and Physical, chart, labs and discussed the procedure including the risks, benefits and alternatives for the proposed anesthesia with the patient or authorized representative who has indicated his/her understanding and acceptance.     Dental advisory given  Plan Discussed with: CRNA  Anesthesia Plan Comments:        Anesthesia Quick Evaluation

## 2023-07-15 NOTE — Patient Instructions (Signed)
SURGICAL WAITING ROOM VISITATION  Patients having surgery or a procedure may have no more than 2 support people in the waiting area - these visitors may rotate.    Children under the age of 51 must have an adult with them who is not the patient.  Due to an increase in RSV and influenza rates and associated hospitalizations, children ages 37 and under may not visit patients in [facility] hospitals.  Visitors with respiratory illnesses are discouraged from visiting and should remain at home.  If the patient needs to stay at the hospital during part of their recovery, the visitor guidelines for inpatient rooms apply. Pre-op nurse will coordinate an appropriate time for 1 support person to accompany patient in pre-op.  This support person may not rotate.    Please refer to the [facility] website for the visitor guidelines for Inpatients (after your surgery is over and you are in a regular room).    Your procedure is scheduled on: Wednesday, Jul 20, 2023   Report to [facility] Main Entrance    Report to admitting at 11:45 AM   Call this number if you have problems the morning of surgery [phone]   Do not eat food :After 7pm the night before surgery.   You may have the following liquids until 11:00 AM DAY OF SURGERY  Water Non-Citrus Juices (without pulp, NO RED-Apple, White grape, White cranberry) Black Coffee (NO MILK/CREAM OR CREAMERS, sugar ok)  Clear Tea (NO MILK/CREAM OR CREAMERS, sugar ok) regular and decaf                             Plain Jell-O (NO RED)                                           Fruit ices (not with fruit pulp, NO RED)                                     Popsicles (NO RED)                                                               Sports drinks like Gatorade (NO RED)  FOLLOW BOWEL PREP AND ANY ADDITIONAL PRE OP INSTRUCTIONS YOU RECEIVED FROM YOUR SURGEON'S OFFICE!!!     Oral Hygiene is also important to reduce your risk of infection.                                     Remember - BRUSH YOUR TEETH THE MORNING OF SURGERY WITH YOUR REGULAR TOOTHPASTE  DENTURES WILL BE REMOVED PRIOR TO SURGERY PLEASE DO NOT APPLY "Poly grip" OR ADHESIVES!!!   Stop all vitamins and herbal supplements 7 days before surgery.   Take these medicines the morning of surgery with A SIP OF WATER: Gabapentin, Levothyroxine, Metoprolol, Pantoprazole   Use Symbicort per usual    Bring Asthma Inhaler day of surgery  You may not have any metal on your body including hair pins, jewelry, and body piercing             Do not wear make-up, lotions, powders, perfumes, or deodorant  Do not wear nail polish including gel and S&S, artificial/acrylic nails, or any other type of covering on natural nails including finger and toenails. If you have artificial nails, gel coating, etc. that needs to be removed by a nail salon please have this removed prior to surgery or surgery may need to be canceled/ delayed if the surgeon/ anesthesia feels like they are unable to be safely monitored.   Do not shave  48 hours prior to surgery.    Do not bring valuables to the hospital. [facility] IS NOT             RESPONSIBLE   FOR VALUABLES.   Contacts, glasses, dentures or bridgework may not be worn into surgery.   Bring small overnight bag day of surgery.   DO NOT BRING YOUR HOME MEDICATIONS TO THE HOSPITAL. PHARMACY WILL DISPENSE MEDICATIONS LISTED ON YOUR MEDICATION LIST TO YOU DURING YOUR ADMISSION IN THE HOSPITAL!    Patients discharged on the day of surgery will not be allowed to drive home.  Someone NEEDS to stay with you for the first 24 hours after anesthesia.   Special Instructions: Bring a copy of your healthcare power of attorney and living will documents the day of surgery if you haven't scanned them before.              Please read over the following fact sheets you were given: IF YOU HAVE QUESTIONS ABOUT YOUR PRE-OP INSTRUCTIONS PLEASE CALL  [phone]   If you received a COVID test during your pre-op visit  it is requested that you wear a mask when out in public, stay away from anyone that may not be feeling well and notify your surgeon if you develop symptoms. If you test positive for Covid or have been in contact with anyone that has tested positive in the last 10 days please notify you surgeon.  [facility] - Preparing for Surgery Before surgery, you can play an important role.  Because skin is not sterile, your skin needs to be as free of germs as possible.  You can reduce the number of germs on your skin by washing with CHG (chlorahexidine gluconate) soap before surgery.  CHG is an antiseptic cleaner which kills germs and bonds with the skin to continue killing germs even after washing. Please DO NOT use if you have an allergy to CHG or antibacterial soaps.  If your skin becomes reddened/irritated stop using the CHG and inform your nurse when you arrive at Short Stay. Do not shave (including legs and underarms) for at least 48 hours prior to the first CHG shower.  You may shave your face/neck.  Please follow these instructions carefully:  1.  Shower with CHG Soap the night before surgery and the  morning of surgery.  2.  If you choose to wash your hair, wash your hair first as usual with your normal  shampoo.  3.  After you shampoo, rinse your hair and body thoroughly to remove the shampoo.                             4.  Use CHG as you would any other liquid soap.  You can apply chg directly to the skin  and wash.  Gently with a scrungie or clean washcloth.  5.  Apply the CHG Soap to your body ONLY FROM THE NECK DOWN.   Do   not use on face/ open                           Wound or open sores. Avoid contact with eyes, ears mouth and   genitals (private parts).                       Wash face,  Genitals (private parts) with your normal soap.             6.  Wash thoroughly, paying special attention to the area where your     surgery  will be performed.  7.  Thoroughly rinse your body with warm water from the neck down.  8.  DO NOT shower/wash with your normal soap after using and rinsing off the CHG Soap.                9.  Pat yourself dry with a clean towel.            10.  Wear clean pajamas.            11.  Place clean sheets on your bed the night of your first shower and do not  sleep with pets. Day of Surgery : Do not apply any lotions/deodorants the morning of surgery.  Please wear clean clothes to the hospital/surgery center.  FAILURE TO FOLLOW THESE INSTRUCTIONS MAY RESULT IN THE CANCELLATION OF YOUR SURGERY  PATIENT SIGNATURE_________________________________  NURSE SIGNATURE__________________________________  ________________________________________________________________________

## 2023-07-15 NOTE — Progress Notes (Deleted)
For Anesthesia: PCP - Norm Salt, PA  Cardiologist -   Bowel Prep reminder:  Chest x-ray -  EKG -  Stress Test -  ECHO -  Cardiac Cath -  Pacemaker/ICD device last checked: Pacemaker orders received: Device Rep notified:  Spinal Cord Stimulator:  Sleep Study -  CPAP -   Fasting Blood Sugar -  Checks Blood Sugar _____ times a day Date and result of last Hgb A1c-  Last dose of GLP1 agonist-  GLP1 instructions:   Last dose of SGLT-2 inhibitors-  SGLT-2 instructions:   Blood Thinner Instructions: Aspirin Instructions: Last Dose:  Activity level: Can go up a flight of stairs and activities of daily living without stopping and without chest pain and/or shortness of breath   Able to exercise without chest pain and/or shortness of breath   Unable to go up a flight of stairs without chest pain and/or shortness of breath     Anesthesia review:   Patient denies shortness of breath, fever, cough and chest pain at PAT appointment   Patient verbalized understanding of instructions that were given to them at the PAT appointment. Patient was also instructed that they will need to review over the PAT instructions again at home before surgery.

## 2023-07-18 ENCOUNTER — Encounter (HOSPITAL_COMMUNITY): Payer: Self-pay

## 2023-07-18 ENCOUNTER — Encounter (HOSPITAL_COMMUNITY)
Admission: RE | Admit: 2023-07-18 | Discharge: 2023-07-18 | Disposition: A | Discharge status: Home / Self Care | Payer: 59 | Source: Ambulatory Visit | Attending: Surgery | Admitting: Surgery

## 2023-07-18 ENCOUNTER — Other Ambulatory Visit: Payer: Self-pay

## 2023-07-18 VITALS — BP 174/94 | HR 96 | Temp 98.6°F | Resp 16 | Ht 65.0 in | Wt 163.0 lb

## 2023-07-18 DIAGNOSIS — E119 Type 2 diabetes mellitus without complications: Secondary | ICD-10-CM | POA: Diagnosis not present

## 2023-07-18 DIAGNOSIS — Z01812 Encounter for preprocedural laboratory examination: Secondary | ICD-10-CM | POA: Insufficient documentation

## 2023-07-18 DIAGNOSIS — Z01818 Encounter for other preprocedural examination: Secondary | ICD-10-CM

## 2023-07-18 LAB — BASIC METABOLIC PANEL
Anion gap: 7 (ref 5–15)
BUN: 17 mg/dL (ref 6–20)
CO2: 23 mmol/L (ref 22–32)
Calcium: 9.4 mg/dL (ref 8.9–10.3)
Chloride: 112 mmol/L — ABNORMAL HIGH (ref 98–111)
Creatinine, Ser: 0.81 mg/dL (ref 0.44–1.00)
GFR, Estimated: >60 mL/min (ref >60)
Glucose, Bld: 79 mg/dL (ref 70–99)
Potassium: 3.7 mmol/L (ref 3.5–5.1)
Sodium: 142 mmol/L (ref 135–145)

## 2023-07-18 LAB — CBC WITH DIFFERENTIAL/PLATELET
Abs Immature Granulocytes: 0.01 10*3/uL (ref 0.00–0.07)
Basophils Absolute: 0 10*3/uL (ref 0.0–0.1)
Basophils Relative: 0 %
Eosinophils Absolute: 0.1 10*3/uL (ref 0.0–0.5)
Eosinophils Relative: 1 %
HCT: 34.8 % — ABNORMAL LOW (ref 36.0–46.0)
Hemoglobin: 11.4 g/dL — ABNORMAL LOW (ref 12.0–15.0)
Immature Granulocytes: 0 %
Lymphocytes Relative: 26 %
Lymphs Abs: 1.8 10*3/uL (ref 0.7–4.0)
MCH: 31.1 pg (ref 26.0–34.0)
MCHC: 32.8 g/dL (ref 30.0–36.0)
MCV: 95.1 fL (ref 80.0–100.0)
Monocytes Absolute: 0.4 10*3/uL (ref 0.1–1.0)
Monocytes Relative: 6 %
Neutro Abs: 4.6 10*3/uL (ref 1.7–7.7)
Neutrophils Relative %: 67 %
Platelets: 249 10*3/uL (ref 150–400)
RBC: 3.66 MIL/uL — ABNORMAL LOW (ref 3.87–5.11)
RDW: 13.7 % (ref 11.5–15.5)
WBC: 7 10*3/uL (ref 4.0–10.5)
nRBC: 0 % (ref 0.0–0.2)

## 2023-07-18 LAB — HEMOGLOBIN A1C
Hgb A1c MFr Bld: 5.2 % (ref 4.8–5.6)
Mean Plasma Glucose: 102.54 mg/dL

## 2023-07-18 NOTE — Progress Notes (Addendum)
Needs med rec. No pharm tech available at time of appointment. Gave patient phone number to call.  COVID Vaccine Completed: no  Date of COVID positive in last 90 days: no  PCP - Norva Riffle, PA Cardiologist - Norman Herrlich, MD LOV 06/08/19  Chest x-ray - n/a EKG - 04/04/23 Epic Stress Test - n/a ECHO - 03/07/19 Epic Cardiac Cath - n/a Pacemaker/ICD device last checked: n/a Spinal Cord Stimulator: n/a  Bowel Prep - Fleet enema, clears after 7pm night before  Sleep Study - n/a CPAP -   Fasting Blood Sugar - preDM, some checks at home, no medications Checks Blood Sugar _____ times a day  Last dose of GLP1 agonist-  N/A GLP1 instructions:  Hold 7 days before surgery    Last dose of SGLT-2 inhibitors-  N/A SGLT-2 instructions:  Hold 3 days before surgery    Blood Thinner Instructions: n/a Aspirin Instructions: Last Dose:  Activity level: Can go up a flight of stairs and perform activities of daily living without stopping and without symptoms of chest pain. SOB with activity, not new per pt  Anesthesia review: BP 165/105, 173/97 and 174/94. Denies symptoms at PAT. Instructed to monitor at home and call PCP if still elevated.  Patient denies shortness of breath, fever, cough and chest pain at PAT appointment  Patient verbalized understanding of instructions that were given to them at the PAT appointment. Patient was also instructed that they will need to review over the PAT instructions again at home before surgery.

## 2023-07-19 ENCOUNTER — Other Ambulatory Visit (HOSPITAL_COMMUNITY): Payer: 59

## 2023-07-20 ENCOUNTER — Ambulatory Visit (HOSPITAL_COMMUNITY)
Admission: RE | Admit: 2023-07-20 | Discharge: 2023-07-20 | Disposition: A | Discharge status: Home / Self Care | Payer: 59 | Attending: Surgery | Admitting: Surgery

## 2023-07-20 ENCOUNTER — Encounter (HOSPITAL_COMMUNITY)
Admission: RE | Disposition: A | Discharge status: Home / Self Care | Payer: Self-pay | Source: Home / Self Care | Attending: Surgery

## 2023-07-20 ENCOUNTER — Encounter (HOSPITAL_COMMUNITY): Payer: Self-pay | Admitting: Surgery

## 2023-07-20 ENCOUNTER — Other Ambulatory Visit: Payer: Self-pay

## 2023-07-20 ENCOUNTER — Ambulatory Visit (HOSPITAL_COMMUNITY): Payer: 59 | Admitting: Anesthesiology

## 2023-07-20 ENCOUNTER — Ambulatory Visit (HOSPITAL_BASED_OUTPATIENT_CLINIC_OR_DEPARTMENT_OTHER): Payer: 59 | Admitting: Anesthesiology

## 2023-07-20 DIAGNOSIS — I10 Essential (primary) hypertension: Secondary | ICD-10-CM

## 2023-07-20 DIAGNOSIS — Z79891 Long term (current) use of opiate analgesic: Secondary | ICD-10-CM | POA: Insufficient documentation

## 2023-07-20 DIAGNOSIS — K603 Anal fistula, unspecified: Secondary | ICD-10-CM | POA: Diagnosis not present

## 2023-07-20 DIAGNOSIS — E119 Type 2 diabetes mellitus without complications: Secondary | ICD-10-CM

## 2023-07-20 DIAGNOSIS — L409 Psoriasis, unspecified: Secondary | ICD-10-CM | POA: Insufficient documentation

## 2023-07-20 DIAGNOSIS — K449 Diaphragmatic hernia without obstruction or gangrene: Secondary | ICD-10-CM | POA: Diagnosis not present

## 2023-07-20 DIAGNOSIS — J45909 Unspecified asthma, uncomplicated: Secondary | ICD-10-CM

## 2023-07-20 DIAGNOSIS — M329 Systemic lupus erythematosus, unspecified: Secondary | ICD-10-CM | POA: Insufficient documentation

## 2023-07-20 DIAGNOSIS — K60329 Anal fistula, complex, unspecified: Secondary | ICD-10-CM | POA: Insufficient documentation

## 2023-07-20 DIAGNOSIS — M069 Rheumatoid arthritis, unspecified: Secondary | ICD-10-CM | POA: Diagnosis not present

## 2023-07-20 DIAGNOSIS — F419 Anxiety disorder, unspecified: Secondary | ICD-10-CM | POA: Diagnosis not present

## 2023-07-20 DIAGNOSIS — E039 Hypothyroidism, unspecified: Secondary | ICD-10-CM | POA: Diagnosis not present

## 2023-07-20 DIAGNOSIS — Z01818 Encounter for other preprocedural examination: Secondary | ICD-10-CM

## 2023-07-20 DIAGNOSIS — K219 Gastro-esophageal reflux disease without esophagitis: Secondary | ICD-10-CM | POA: Insufficient documentation

## 2023-07-20 DIAGNOSIS — Z7952 Long term (current) use of systemic steroids: Secondary | ICD-10-CM | POA: Insufficient documentation

## 2023-07-20 DIAGNOSIS — Z79899 Other long term (current) drug therapy: Secondary | ICD-10-CM | POA: Insufficient documentation

## 2023-07-20 DIAGNOSIS — K60313 Anal fistula, simple, recurrent: Secondary | ICD-10-CM | POA: Diagnosis not present

## 2023-07-20 DIAGNOSIS — K611 Rectal abscess: Secondary | ICD-10-CM | POA: Diagnosis not present

## 2023-07-20 HISTORY — DX: Presence of spectacles and contact lenses: Z97.3

## 2023-07-20 HISTORY — DX: Personal history of other specified conditions: Z87.898

## 2023-07-20 HISTORY — DX: Generalized anxiety disorder: F41.1

## 2023-07-20 HISTORY — DX: Moderate persistent asthma, uncomplicated: J45.40

## 2023-07-20 HISTORY — DX: Iron deficiency anemia, unspecified: D50.9

## 2023-07-20 HISTORY — DX: Adverse effect of antineoplastic and immunosuppressive drugs, initial encounter: T45.1X5A

## 2023-07-20 HISTORY — DX: Arthropathic psoriasis, unspecified: L40.50

## 2023-07-20 HISTORY — DX: Drug-induced polyneuropathy: G62.0

## 2023-07-20 HISTORY — PX: INCISION AND DRAINAGE PERIRECTAL ABSCESS: SHX1804

## 2023-07-20 HISTORY — DX: Drug or chemical induced diabetes mellitus without complications: E09.9

## 2023-07-20 HISTORY — DX: Systemic lupus erythematosus, unspecified: M32.9

## 2023-07-20 HISTORY — DX: Diverticulosis of large intestine without perforation or abscess without bleeding: K57.30

## 2023-07-20 HISTORY — DX: Adverse effect of glucocorticoids and synthetic analogues, initial encounter: T38.0X5A

## 2023-07-20 HISTORY — PX: LIGATION OF INTERNAL FISTULA TRACT: SHX6551

## 2023-07-20 HISTORY — PX: RECTAL EXAM UNDER ANESTHESIA: SHX6399

## 2023-07-20 HISTORY — DX: Complete loss of teeth, unspecified cause, unspecified class: K08.109

## 2023-07-20 HISTORY — DX: Complete loss of teeth, unspecified cause, unspecified class: Z97.2

## 2023-07-20 LAB — GLUCOSE, CAPILLARY: Glucose-Capillary: 96 mg/dL (ref 70–99)

## 2023-07-20 SURGERY — LIGATION, INTERNAL FISTULA TRACT
Anesthesia: General

## 2023-07-20 MED ORDER — ROCURONIUM BROMIDE 10 MG/ML (PF) SYRINGE
PREFILLED_SYRINGE | INTRAVENOUS | Status: AC
  Filled 2023-07-20: qty 10

## 2023-07-20 MED ORDER — ACETAMINOPHEN 500 MG PO TABS
1000.0000 mg | ORAL_TABLET | ORAL | Status: AC
Start: 2023-07-21 — End: 2023-07-20
  Administered 2023-07-20: 1000 mg via ORAL
  Filled 2023-07-20: qty 2

## 2023-07-20 MED ORDER — ONDANSETRON HCL 4 MG/2ML IJ SOLN
INTRAMUSCULAR | Status: DC | PRN
  Administered 2023-07-20: 4 mg via INTRAVENOUS

## 2023-07-20 MED ORDER — PROPOFOL 10 MG/ML IV BOLUS
INTRAVENOUS | Status: AC
  Filled 2023-07-20: qty 20

## 2023-07-20 MED ORDER — DEXAMETHASONE SODIUM PHOSPHATE 10 MG/ML IJ SOLN
INTRAMUSCULAR | Status: AC
  Filled 2023-07-20: qty 1

## 2023-07-20 MED ORDER — BUPIVACAINE-EPINEPHRINE (PF) 0.25% -1:200000 IJ SOLN
INTRAMUSCULAR | Status: DC | PRN
  Administered 2023-07-20: 50 mL

## 2023-07-20 MED ORDER — DEXAMETHASONE SODIUM PHOSPHATE 10 MG/ML IJ SOLN
INTRAMUSCULAR | Status: DC | PRN
  Administered 2023-07-20: 8 mg via INTRAVENOUS

## 2023-07-20 MED ORDER — ROCURONIUM BROMIDE 100 MG/10ML IV SOLN
INTRAVENOUS | Status: DC | PRN
  Administered 2023-07-20: 40 mg via INTRAVENOUS

## 2023-07-20 MED ORDER — LIDOCAINE HCL (CARDIAC) PF 100 MG/5ML IV SOSY
PREFILLED_SYRINGE | INTRAVENOUS | Status: DC | PRN
  Administered 2023-07-20: 40 mg via INTRAVENOUS

## 2023-07-20 MED ORDER — MIDAZOLAM HCL 5 MG/5ML IJ SOLN
INTRAMUSCULAR | Status: DC | PRN
  Administered 2023-07-20: 2 mg via INTRAVENOUS

## 2023-07-20 MED ORDER — MIDAZOLAM HCL 2 MG/2ML IJ SOLN
INTRAMUSCULAR | Status: AC
  Filled 2023-07-20: qty 2

## 2023-07-20 MED ORDER — GENTAMICIN SULFATE 40 MG/ML IJ SOLN
360.0000 mg | INTRAVENOUS | Status: AC
Start: 2023-07-20 — End: 2023-07-20
  Administered 2023-07-20: 360 mg via INTRAVENOUS
  Filled 2023-07-20: qty 9

## 2023-07-20 MED ORDER — ONDANSETRON HCL 4 MG/2ML IJ SOLN
INTRAMUSCULAR | Status: AC
  Filled 2023-07-20: qty 2

## 2023-07-20 MED ORDER — 0.9 % SODIUM CHLORIDE (POUR BTL) OPTIME
TOPICAL | Status: DC | PRN
Start: 2023-07-20 — End: 2023-07-20
  Administered 2023-07-20: 1000 mL

## 2023-07-20 MED ORDER — PROPOFOL 10 MG/ML IV BOLUS
INTRAVENOUS | Status: DC | PRN
  Administered 2023-07-20: 120 mg via INTRAVENOUS

## 2023-07-20 MED ORDER — PHENYLEPHRINE HCL (PRESSORS) 10 MG/ML IV SOLN
INTRAVENOUS | Status: DC | PRN
  Administered 2023-07-20: 160 ug via INTRAVENOUS
  Administered 2023-07-20: 240 ug via INTRAVENOUS
  Administered 2023-07-20: 80 ug via INTRAVENOUS

## 2023-07-20 MED ORDER — AMISULPRIDE (ANTIEMETIC) 5 MG/2ML IV SOLN: 10.0000 mg | Freq: Once | INTRAVENOUS | Status: DC | PRN

## 2023-07-20 MED ORDER — TRAMADOL HCL 50 MG PO TABS: 50.0000 mg | ORAL_TABLET | Freq: Four times a day (QID) | ORAL | 0 refills | Status: AC | PRN

## 2023-07-20 MED ORDER — LIDOCAINE HCL (PF) 2 % IJ SOLN
INTRAMUSCULAR | Status: AC
  Filled 2023-07-20: qty 5

## 2023-07-20 MED ORDER — FENTANYL CITRATE PF 50 MCG/ML IJ SOSY: 25.0000 ug | PREFILLED_SYRINGE | INTRAMUSCULAR | Status: DC | PRN

## 2023-07-20 MED ORDER — BUPIVACAINE-EPINEPHRINE 0.25% -1:200000 IJ SOLN
INTRAMUSCULAR | Status: AC
  Filled 2023-07-20: qty 1

## 2023-07-20 MED ORDER — SUGAMMADEX SODIUM 200 MG/2ML IV SOLN
INTRAVENOUS | Status: DC | PRN
  Administered 2023-07-20: 200 mg via INTRAVENOUS

## 2023-07-20 MED ORDER — PHENYLEPHRINE 80 MCG/ML (10ML) SYRINGE FOR IV PUSH (FOR BLOOD PRESSURE SUPPORT)
PREFILLED_SYRINGE | INTRAVENOUS | Status: AC
  Filled 2023-07-20: qty 10

## 2023-07-20 MED ORDER — CHLORHEXIDINE GLUCONATE CLOTH 2 % EX PADS: 6.0000 | MEDICATED_PAD | Freq: Once | CUTANEOUS | Status: DC

## 2023-07-20 MED ORDER — LACTATED RINGERS IV SOLN: INTRAVENOUS | Status: DC

## 2023-07-20 MED ORDER — BUPIVACAINE LIPOSOME 1.3 % IJ SUSP
INTRAMUSCULAR | Status: AC
  Filled 2023-07-20: qty 20

## 2023-07-20 MED ORDER — INSULIN ASPART 100 UNIT/ML IJ SOLN: 0.0000 [IU] | INTRAMUSCULAR | Status: DC | PRN

## 2023-07-20 MED ORDER — LACTATED RINGERS IV SOLN: INTRAVENOUS | Status: DC | PRN

## 2023-07-20 MED ORDER — BUPIVACAINE LIPOSOME 1.3 % IJ SUSP: 20.0000 mL | Freq: Once | INTRAMUSCULAR | Status: DC

## 2023-07-20 MED ORDER — FENTANYL CITRATE (PF) 100 MCG/2ML IJ SOLN
INTRAMUSCULAR | Status: AC
  Filled 2023-07-20: qty 2

## 2023-07-20 MED ORDER — FENTANYL CITRATE (PF) 100 MCG/2ML IJ SOLN
INTRAMUSCULAR | Status: DC | PRN
  Administered 2023-07-20: 25 ug via INTRAVENOUS
  Administered 2023-07-20: 50 ug via INTRAVENOUS
  Administered 2023-07-20: 25 ug via INTRAVENOUS

## 2023-07-20 MED ORDER — CLINDAMYCIN PHOSPHATE 900 MG/50ML IV SOLN
900.0000 mg | INTRAVENOUS | Status: AC
Start: 2023-07-20 — End: 2023-07-20
  Administered 2023-07-20: 900 mg via INTRAVENOUS
  Filled 2023-07-20: qty 50

## 2023-07-20 SURGICAL SUPPLY — 34 items
BAG COUNTER SPONGE SURGICOUNT (BAG) IMPLANT
BENZOIN TINCTURE PRP APPL 2/3 (GAUZE/BANDAGES/DRESSINGS) ×2 IMPLANT
BLADE SURG 15 STRL LF DISP TIS (BLADE) IMPLANT
BRIEF MESH DISP LRG (UNDERPADS AND DIAPERS) ×2 IMPLANT
CNTNR URN SCR LID CUP LEK RST (MISCELLANEOUS) ×2 IMPLANT
COVER SURGICAL LIGHT HANDLE (MISCELLANEOUS) ×2 IMPLANT
DRAPE LAPAROTOMY T 102X78X121 (DRAPES) ×2 IMPLANT
ELECT NDL BLADE 2-5/6 (NEEDLE) ×2 IMPLANT
ELECT NEEDLE BLADE 2-5/6 (NEEDLE) ×2
ELECT REM PT RETURN 15FT ADLT (MISCELLANEOUS) ×2 IMPLANT
GAUZE 4X4 16PLY ~~LOC~~+RFID DBL (SPONGE) ×2 IMPLANT
GAUZE PAD ABD 8X10 STRL (GAUZE/BANDAGES/DRESSINGS) IMPLANT
GAUZE SPONGE 4X4 12PLY STRL (GAUZE/BANDAGES/DRESSINGS) IMPLANT
GLOVE BIO SURGEON STRL SZ7.5 (GLOVE) ×2 IMPLANT
GLOVE INDICATOR 8.0 STRL GRN (GLOVE) ×2 IMPLANT
GOWN STRL REUS W/ TWL XL LVL3 (GOWN DISPOSABLE) ×4 IMPLANT
KIT BASIN OR (CUSTOM PROCEDURE TRAY) ×2 IMPLANT
KIT TURNOVER KIT A (KITS) IMPLANT
LOOP VESSEL MAXI BLUE (MISCELLANEOUS) IMPLANT
NDL HYPO 22X1.5 SAFETY MO (MISCELLANEOUS) ×2 IMPLANT
NEEDLE HYPO 22X1.5 SAFETY MO (MISCELLANEOUS) ×2
PACK BASIC VI WITH GOWN DISP (CUSTOM PROCEDURE TRAY) ×2 IMPLANT
PENCIL SMOKE EVACUATOR (MISCELLANEOUS) IMPLANT
SHEARS HARMONIC 9CM CVD (BLADE) IMPLANT
SPIKE FLUID TRANSFER (MISCELLANEOUS) ×2 IMPLANT
SURGILUBE 2OZ TUBE FLIPTOP (MISCELLANEOUS) ×2 IMPLANT
SUT CHROMIC 2 0 SH (SUTURE) ×2 IMPLANT
SUT CHROMIC 3 0 SH 27 (SUTURE) IMPLANT
SUT SILK 2-0 18XBRD TIE 12 (SUTURE) IMPLANT
SUT VIC AB 2-0 SH 27X BRD (SUTURE) IMPLANT
SUT VIC AB 2-0 UR6 27 (SUTURE) ×12 IMPLANT
SYR 20ML LL LF (SYRINGE) ×2 IMPLANT
SYR 3ML LL SCALE MARK (SYRINGE) IMPLANT
TOWEL OR 17X26 10 PK STRL BLUE (TOWEL DISPOSABLE) ×2 IMPLANT

## 2023-07-20 NOTE — Discharge Instructions (Signed)
ANORECTAL SURGERY: POST OP INSTRUCTIONS  DIET: Follow a light bland diet the first 24 hours after arrival home, such as soup, liquids, crackers, etc.  Be sure to include lots of fluids daily.  Avoid fast food or heavy meals as your are more likely to get nauseated.  Eat a low fat diet the next few days after surgery.   Some bleeding with bowel movements is expected for the first couple of days but this should stop in between bowel movements  Take your usually prescribed home medications unless otherwise directed. No foreign bodies per rectum for the next 3 months (enemas, etc)  PAIN CONTROL: It is helpful to take an over-the-counter pain medication regularly for the first few days/weeks.  Choose from the following that works best for you: Ibuprofen (Advil, etc) Three 200mg  tabs every 6 hours as needed. Acetaminophen (Tylenol, etc) 500-650mg  every 6 hours as needed NOTE: You may take both of these medications together - most patients find it most helpful when alternating between the two (i.e. Ibuprofen at 6am, tylenol at 9am, ibuprofen at 12pm ..Marland Kitchen) A  prescription for pain medication may have been prescribed for you at discharge.  Take your pain medication as prescribed.  If you are having problems/concerns with the prescription medicine, please call us for further advice.  Avoid getting constipated.  Between the surgery and the pain medications, it is common to experience some constipation.  Increasing fluid intake (64oz of water per day) and taking a fiber supplement (such as Metamucil, Citrucel, FiberCon) 1-2 times a day regularly will usually help prevent this problem from occurring.  Take Miralax (over the counter) 1-2x/day while taking a narcotic pain medication. If no bowel movement after 48hours, you may additionally take a laxative like a bottle of Milk of Magnesia which can be purchased over the counter. Avoid enemas.   Watch out for diarrhea.  If you have many loose bowel movements,  simplify your diet to bland foods.  Stop any stool softeners and decrease your fiber supplement. If this worsens or does not improve, please call us.  Wash / shower every day.  If you were discharged with a dressing, you may remove this the day after your surgery. You may shower normally, getting soap/water on your wound, particularly after bowel movements.  Soaking in a warm bath filled a couple inches ("Sitz bath") is a great way to clean the area after a bowel movement and many patients find it is a way to soothe the area.  ACTIVITIES as tolerated:   You may resume regular (light) daily activities beginning the next day--such as daily self-care, walking, climbing stairs--gradually increasing activities as tolerated.  If you can walk 30 minutes without difficulty, it is safe to try more intense activity such as jogging, treadmill, bicycling, low-impact aerobics, etc. Refrain from any heavy lifting or straining for the first 2 weeks after your procedure, particularly if your surgery was for hemorrhoids. Avoid activities that make your pain worse You may drive when you are no longer taking prescription pain medication, you can comfortably wear a seatbelt, and you can safely maneuver your car and apply brakes.  FOLLOW UP in our office Please call [facility] at [phone] to set up an appointment to see your surgeon in the office for a follow-up appointment approximately 2 weeks after your surgery. Make sure that you call for this appointment the day you arrive home to insure a convenient appointment time.  9. If you have disability or family leave forms  that need to be completed, you may have them completed by your primary care physician's office; for return to work instructions, please ask our office staff and they will be happy to assist you in obtaining this documentation   When to call us [phone]: Poor pain control Reactions / problems with new medications (rash/itching, etc)  Fever over  101.5 F (38.5 C) Inability to urinate Nausea/vomiting Worsening swelling or bruising Continued bleeding from incision. Increased pain, redness, or drainage from the incision  The clinic staff is available to answer your questions during regular business hours (8:30am-5pm).  Please don't hesitate to call and ask to speak to one of our nurses for clinical concerns.   A surgeon from [facility] is always on call at the hospitals   If you have a medical emergency, go to the nearest emergency room or call 911.   [facility] [address], [place], [place]  [address] MAIN: [phone] FAX: [phone] [email or web address]

## 2023-07-20 NOTE — Op Note (Signed)
07/20/2023  3:21 PM  PATIENT:  [name]  58 y.o. 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  EBL: 5 mL  Drains: Additional seton placement   PLAN OF CARE: Discharge to home after PACU  PATIENT DISPOSITION:  PACU - hemodynamically stable.  OR FINDINGS: Perirectal abscess in posterior midline post sacral space. Found to communicate with punctate tract to external opening of her known transsphincteric fistula. This tract was controlled with additional vessel loop. Indwelling vessel loop seton controlling fistula is also exchanged for fresh seton.  DESCRIPTION: The patient was identified in the preoperative holding area and taken to the OR. SCDs were applied. She then underwent general endotracheal anesthesia without difficulty. The patient was then rolled onto the OR table in the prone jackknife position. Pressure points were then evaluated and padded. Benzoin was applied to the buttocks and they were gently taped apart.  She was then prepped and draped in usual sterile fashion.  Of note, during skin prep, a tense abscess cavity is noted in the posterior midline of the post sacral space.  A surgical timeout was performed indicating the correct patient, procedure, and positioning.  A perianal block was then  created using a dilute mixture of 0.25% Marcaine with epinephrine and Exparel.  After ascertaining an appropriate level of anesthesia had been achieved, a well lubricated digital rectal exam was performed. This demonstrated no palpable abnormalities.  Pre-existing blue vessel loop seton is in position controlling a left posterior lateral transsphincteric anal fistula.  External opening is clean in appearance.  There are no palpable abnormalities along the right gluteal cleft or within the anal canal.  A Hill-Ferguson anoscope was into the anal canal and circumferential inspection demonstrated healthy appearing anoderm.  Blue vessel loop seton internal opening emanating in the posterior midline.  The tract is palpated and found to remain as a transsphincteric anal fistula.   Attention is first directed at the evident abscess in the posterior midline overlying the sacrum/coccyx.  It is tense.  There is approximately 5 to 6 cc of purulent fluid that spontaneously begins draining after incising the skin.  The tract was irrigated.  Hemostasis is achieved electrocautery.  The tract was then meticulously probed carefully so as to avoid creating any false passages.  No other additional pockets are identified.  It is not found to continue into the anal canal or communicate with her pre-existing internal opening.  It does however communicate with the external opening of her current fistula.  Given the purulent findings, we did opt to forego attempting a lift procedure today out of concern that the success rate would be lowered in the setting of recent abscess.  Therefore, we opted to proceed with controlling this additional tract using a blue vessel loop seton.  Our fistula probe was exchanged for this.  This was secured to itself using 2-0 silk sutures.  The pre-existing fistula  tract is then carefully probed.  The seton is then cut and removed.  A fresh seton is then placed by exchanging our fistula probe for this.   This was secured to itself in a similar manner using 2-0 silk sutures.  The anal canal was inspected.  Hemostasis is verified.  Additional local anesthetic is infiltrated around the surgical sites.  All sponge, needle, and instrument counts were reported correct.  The buttocks are untaped.  A dressing consisting of 4 x 4's, ABD, mesh underwear was ultimately placed.  She is rolled back onto a stretcher, awakened from anesthesia, extubated, and transported to the recovery room in satisfactory condition.  DISPOSITION: PACU in satisfactory condition.

## 2023-07-20 NOTE — Anesthesia Postprocedure Evaluation (Signed)
Anesthesia Post Note  Patient: Warehouse manager  Procedure(s) Performed: SURGICAL TREATMENT OF TRANSSPHINCTERIC ANAL FISTULA LIGATION OF INTERNAL FISTULA TRACT AND PLACEMENT OF SETON ANORECTAL EXAM UNDER ANESTHESIA IRRIGATION AND DEBRIDEMENT PERIRECTAL ABSCESS     Patient location during evaluation: PACU Anesthesia Type: General Level of consciousness: awake and alert Pain management: pain level controlled Vital Signs Assessment: post-procedure vital signs reviewed and stable Respiratory status: spontaneous breathing, nonlabored ventilation, respiratory function stable and patient connected to nasal cannula oxygen Cardiovascular status: blood pressure returned to baseline and stable Postop Assessment: no apparent nausea or vomiting Anesthetic complications: no  No notable events documented.  Last Vitals:  Vitals:   07/20/23 1600 07/20/23 1615  BP: 135/79 (!) 160/82  Pulse: 80 75  Resp: 13 16  Temp:  36.6 C  SpO2: 100% 100%    Last Pain:  Vitals:   07/20/23 1615  TempSrc:   PainSc: 0-No pain                 Kennieth Rad

## 2023-07-20 NOTE — Transfer of Care (Signed)
Immediate Anesthesia Transfer of Care Note  Patient: [name]  Procedure(s) Performed: SURGICAL TREATMENT OF TRANSSPHINCTERIC ANAL FISTULA LIGATION OF INTERNAL FISTULA TRACT AND PLACEMENT OF SETON ANORECTAL EXAM UNDER ANESTHESIA IRRIGATION AND DEBRIDEMENT PERIRECTAL ABSCESS  Patient Location: PACU  Anesthesia Type:General  Level of Consciousness: awake and alert   Airway & Oxygen Therapy: Patient Spontanous Breathing and Patient connected to face mask oxygen  Post-op Assessment: Report given to RN and Post -op Vital signs reviewed and stable  Post vital signs: Reviewed and stable  Last Vitals:  Vitals Value Taken Time  BP 151/79 07/20/23 1527  Temp    Pulse    Resp 17 07/20/23 1529  SpO2    Vitals shown include unfiled device data.  Last Pain:  Vitals:   07/20/23 1227  TempSrc: Oral         Complications: No notable events documented.

## 2023-07-20 NOTE — Anesthesia Procedure Notes (Signed)
Procedure Name: Intubation Date/Time: 07/20/2023 2:23 PM  Performed by: Jamelle Rushing, CRNAPre-anesthesia Checklist: Patient identified, Emergency Drugs available, Suction available, Patient being monitored and Timeout performed Patient Re-evaluated:Patient Re-evaluated prior to induction Oxygen Delivery Method: Circle system utilized Preoxygenation: Pre-oxygenation with 100% oxygen Induction Type: IV induction Ventilation: Mask ventilation without difficulty Laryngoscope Size: Mac and 3 Grade View: Grade II Tube type: Oral Tube size: 7.0 mm Number of attempts: 1 Airway Equipment and Method: Stylet Placement Confirmation: ETT inserted through vocal cords under direct vision, positive ETCO2 and breath sounds checked- equal and bilateral Secured at: 20 cm Tube secured with: Tape Dental Injury: Teeth and Oropharynx as per pre-operative assessment

## 2023-07-20 NOTE — H&P (Signed)
CC: Here today for surgery  HPI: [name] is an 58 y.o. female with history of Hypothyroidism, lupus, psoriasis, rheumatoid arthritis, whom is seen in the office today for follow-up.  Colonoscopy with Savannah. Rhea Maxwell 01/2021: -Diverticulosis in sigmoid - Exam otherwise normal - No specimens. - Repeat colonoscopy in 10 years for screening purposes.  She saw my partner, Savannah. Janee Maxwell, 01/26/2023 for evaluation. He was seeing her for a possible perianal cyst. He noted she had a history of lupus and psoriatic arthritis and has been taking prednisone and cosympic. She reports a many year history of intermittent left perianal lumps that will swell and then drain. Symptoms seem to be exacerbated if she is constipated.  She has never had any prior anorectal surgeries or procedures.  She did have a pelvic ultrasound 12/31/2022 with Savannah. Rhea Maxwell that demonstrated a complex 2.7 cm subcutaneous fluid collection at the site of palpable abnormality, with sinus tract to the skin surface. This was done within the gluteal fold.  With regards to the perianal symptoms, she notes intermittent swelling and drainage that has occurred primarily since her colonoscopy was completed back in 2022. This will swell and drain somewhat intermittently, unpredictably. She denies any prior incision/drainage type procedures or any history of any anorectal surgery or procedures. She denies any known history of inflammatory bowel disease, Crohn's, or ulcerative colitis. She also denies any known family history of this. She herself does have multiple rheumatologic conditions including both rheumatoid arthritis, lupus, and psoriasis.  She denies any evident history of anal fissures or tearing like sensations with bowel movements.  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   Returns today for follow-up. She has been doing well. Seton remains in place. Scant drainage-less than when it was initially placed. No significant pain. Overall feeling quite well.  PMH: Hypothyroidism, lupus, psoriasis, rheumatoid arthritis  PSH: Denies any prior anorectal surgeries or procedures.  FHx: Denies any known family history of colorectal, breast, endometrial or ovarian cancer. Denies any family history of inflammatory bowel disease, Crohn's, ulcerative colitis.  Social Hx: Denies use of tobacco/EtOH/illicit drug. 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   She denies any changes in health or health history since we met in the office. No new medications/allergies. She states she is ready for surgery today.  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   previous oncologist Savannah [name]/ radiation oncology-- Savannah [name];   dx 04/ 2006;   completed chemoradiation 07/ 2006   History of seizures    07-13-2023  pt stated as child due to head injury last one in elementary school , none since and no issue since   Hypertension    Hypothyroidism    followed by pcp   IDA (  iron deficiency anemia)    Intermittent palpitations 2020   evaulated by cardiology--- [name] and released by lov note in epic 06-08-2019;  normal echo 03-07-2019;  CCT/ coronary 10/ 2020 calcium score zero;  event monitor 05-01-2019 no sign  arrhythmia , rare PAC/ PVC   Leiomyoma of uterus, unspecified    Lupus (systemic lupus erythematosus) (HCC)    followed by rheuomatology   Migraines    neurology-- [name];   takes Topamax and Maxalt   Moderate persistent asthma    followed by pcp  and as needed  pulm;   (07-13-2023  pt stated last used rescue inhaler 2 weeks ago and last used nebulizer 1.5 wks ago)   Neuropathy due to chemotherapeutic drug (HCC)    Pneumonia    Psoriasis    Psoriatic arthritis of multiple joints [facility])    rheumatologist--- [name] ([facility] [place] [facility])  - ankles, knees, hands, neck   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)    followed by pcp;   diet controlled;    related to prednisone use, no meds,cbg normal   Wears glasses     Past Surgical History:  Procedure Laterality Date   CESAREAN SECTION  1992   COLONOSCOPY WITH ESOPHAGOGASTRODUODENOSCOPY (EGD)  02/04/2021   Savannah [name]   DEEP NECK LYMPH NODE BIOPSY / EXCISION  09/30/2004   @[facility]  by Savannah [name];    Excision biopsy, right upper neck and lymph node's  (right retropharyngeal biopsy/ right neck lymph node oropharynx biopsy)   DENTAL RESTORATION/EXTRACTION WITH X-RAY  10/21/2004   @[facility]  by Savannah [name];   Extraction teeth x5;  all four quadrants alveoloplasty scaling & root planing for pre-chemoradiation protocal for chronic perodonitis   DILATION AND EVACUATION  04/22/2004   @ [facility] by Savannah [name]  (fetal demise 18 wks)   ESOPHAGOGASTRODUODENOSCOPY (EGD) WITH ESOPHAGEAL DILATION  12/21/2022   Savannah [name]   MULTIPLE EXTRACTIONS WITH ALVEOLOPLASTY N/A 03/05/2013   Procedure: MULTIPLE EXTRACION WITH ALVEOLOPLASTY, extraction of decayed teeth numbers 3,4,5,18,19,22,23,24,25, extraction of retained root tips teeth numbers 2,20,21,26, bilateral mandibular alveoloplasty and upper right maxillary alveoloplasty;  Surgeon: [name], DDS;  Location: [facility] OR;  Service: Oral Surgery;  Laterality:  N/A;   ORIF ANKLE FRACTURE Right 2000   per pt has retained hardware   PLACEMENT OF SETON N/A 04/11/2023   Procedure: PLACEMENT OF DRAINING  SETON;  Surgeon: Andria Meuse, MD;  Location: [facility] ORS;  Service: General;  Laterality: N/A;   TOOTH EXTRACTION Bilateral 03/05/2013   Procedure: EXTRACTION MOLARS;  Surgeon: [name], DDS;  Location: [facility] OR;  Service: Oral Surgery;  Laterality: Bilateral;    Family History  Problem Relation Age of Onset   Hypertension Mother    Diabetes Mother    Hypertension Father    Lupus Brother    Breast cancer Maternal Grandmother 54   Colon cancer Neg Hx    Esophageal cancer Neg Hx    Rectal cancer Neg Hx    Stomach cancer Neg Hx    Colon polyps Neg Hx     Social:  reports that she quit smoking about 19 years ago. Her smoking use included cigarettes. She started smoking about 27 years ago. She has a 2 pack-year smoking history. She has never used smokeless tobacco. She reports that she does not currently use alcohol. She reports that she does not use drugs.  Allergies:  Allergies  Allergen Reactions   Morphine Itching  Amoxicillin Itching   Latex Itching   Oxycodone Itching   Cephalexin Rash   Ciprofloxacin Hcl Rash   Penicillins Itching and Rash   Plaquenil [Hydroxychloroquine Sulfate] Hives and Rash   Sulfamethoxazole-Trimethoprim Itching and Rash   Tetracyclines & Related Itching and Rash    Medications: I have reviewed the patient's current medications.  Results for orders placed or performed during the hospital encounter of 07/20/23 (from the past 48 hours)  Glucose, capillary     Status: None   Collection Time: 07/20/23 12:32 PM  Result Value Ref Range   Glucose-Capillary 96 70 - 99 mg/dL    Comment: Glucose reference range applies only to samples taken after fasting for at least 8 hours.   Comment 1 Notify RN    Comment 2 Document in Chart     No results found.   PE Blood pressure 100/66, pulse 99,  temperature 98.2 F (36.8 C), temperature source Oral, resp. rate 16, height 5\' 5"  (1.651 m), weight 73.9 kg, last menstrual period 06/25/2016, SpO2 97%. Constitutional: NAD; conversant Eyes: Moist conjunctiva; no lid lag; anicteric Lungs: Normal respiratory effort CV: RRR Psychiatric: Appropriate affect  Results for orders placed or performed during the hospital encounter of 07/20/23 (from the past 48 hours)  Glucose, capillary     Status: None   Collection Time: 07/20/23 12:32 PM  Result Value Ref Range   Glucose-Capillary 96 70 - 99 mg/dL    Comment: Glucose reference range applies only to samples taken after fasting for at least 8 hours.   Comment 1 Notify RN    Comment 2 Document in Chart     No results found.  A/P: [name] is an 58 y.o. 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  Now s/p EUA/fistula treatment with draining seton 04/11/23  -We spent time reviewing with her her procedure, findings, and plans moving forward. We spent time discussing her particular fistula anatomy and options with regards to a transsphincteric fistula.  -The anatomy and physiology of the anal canal was discussed with the patient with associated pictures. The pathophysiology of transsphincteric anal fistulas was discussed at length with associated pictures and illustrations. -We have reviewed options going forward including further observation with definitive indwelling seton management/drainage vs surgery - ligation of intersphincteric fistulous tract (LIFT); anorectal exam under anesthesia -The planned procedure, material risks (including, but not limited to, pain, bleeding, infection, scarring, need for blood  transfusion, damage to anal sphincter, incontinence of gas and/or stool, need for additional procedures, anal stenosis, rare cases of pelvic sepsis which in severe cases may require things like a colostomy, recurrence, pneumonia, heart attack, stroke, death) benefits and alternatives to surgery were discussed at length. I noted a good probability that the procedure would help improve her symptoms. The patient's questions were answered to her satisfaction, she voiced understanding and elected to proceed with surgery. Additionally, we discussed typical postoperative expectations and the recovery process.   Marin Olp, MD [facility]

## 2023-07-21 ENCOUNTER — Other Ambulatory Visit: Payer: 59

## 2023-07-21 ENCOUNTER — Encounter (HOSPITAL_COMMUNITY): Payer: Self-pay | Admitting: Surgery

## 2023-07-28 DIAGNOSIS — L4050 Arthropathic psoriasis, unspecified: Secondary | ICD-10-CM | POA: Diagnosis not present

## 2023-07-28 DIAGNOSIS — I10 Essential (primary) hypertension: Secondary | ICD-10-CM | POA: Diagnosis not present

## 2023-07-28 DIAGNOSIS — E559 Vitamin D deficiency, unspecified: Secondary | ICD-10-CM | POA: Diagnosis not present

## 2023-07-28 DIAGNOSIS — R2689 Other abnormalities of gait and mobility: Secondary | ICD-10-CM | POA: Diagnosis not present

## 2023-07-28 DIAGNOSIS — E039 Hypothyroidism, unspecified: Secondary | ICD-10-CM | POA: Diagnosis not present

## 2023-07-28 DIAGNOSIS — E782 Mixed hyperlipidemia: Secondary | ICD-10-CM | POA: Diagnosis not present

## 2023-07-28 DIAGNOSIS — E611 Iron deficiency: Secondary | ICD-10-CM | POA: Diagnosis not present

## 2023-07-28 DIAGNOSIS — K219 Gastro-esophageal reflux disease without esophagitis: Secondary | ICD-10-CM | POA: Diagnosis not present

## 2023-07-28 DIAGNOSIS — G629 Polyneuropathy, unspecified: Secondary | ICD-10-CM | POA: Diagnosis not present

## 2023-07-28 DIAGNOSIS — Z8709 Personal history of other diseases of the respiratory system: Secondary | ICD-10-CM | POA: Diagnosis not present

## 2023-07-28 DIAGNOSIS — G43909 Migraine, unspecified, not intractable, without status migrainosus: Secondary | ICD-10-CM | POA: Diagnosis not present

## 2023-07-28 DIAGNOSIS — J302 Other seasonal allergic rhinitis: Secondary | ICD-10-CM | POA: Diagnosis not present

## 2023-07-28 DIAGNOSIS — M329 Systemic lupus erythematosus, unspecified: Secondary | ICD-10-CM | POA: Diagnosis not present

## 2023-08-05 ENCOUNTER — Ambulatory Visit
Admission: RE | Admit: 2023-08-05 | Discharge: 2023-08-05 | Disposition: A | Discharge status: Home / Self Care | Payer: 59 | Source: Ambulatory Visit | Attending: Adult Health | Admitting: Adult Health

## 2023-08-05 DIAGNOSIS — Z8589 Personal history of malignant neoplasm of other organs and systems: Secondary | ICD-10-CM | POA: Diagnosis not present

## 2023-08-05 DIAGNOSIS — R519 Headache, unspecified: Secondary | ICD-10-CM

## 2023-08-08 ENCOUNTER — Encounter: Payer: Self-pay | Admitting: Adult Health

## 2023-08-09 ENCOUNTER — Telehealth: Payer: Self-pay | Admitting: Adult Health

## 2023-08-09 NOTE — Telephone Encounter (Signed)
Pt called wanting to speak to the provider regarding her Chiropractor Referral. Please advise.

## 2023-08-10 NOTE — Telephone Encounter (Signed)
**   If patient returns call, please advise that chiropractic offices typically don't need a referral but if they do, Primary care should place referral

## 2023-08-10 NOTE — Telephone Encounter (Signed)
Contacted pt back, LVM rq call back. Office hrs provided.

## 2023-08-16 NOTE — Telephone Encounter (Signed)
 Patient called in regards to chiropractic referral. Pt stated, need to see a chiropractic soon as possible becasue neck is getting worse.  Relayed message to patient. She insist on neurologist making a referral to chiropractic office.

## 2023-08-30 ENCOUNTER — Telehealth: Payer: Self-pay | Admitting: Adult Health

## 2023-08-30 NOTE — Telephone Encounter (Signed)
 Phone rep was asked to call pt and inform her appointment set for 3/27 needs to be cx with NP.  Based upon what pt needs to be seen for at this time her appointment needs to be with MD(Dr Terrace Arabia or Dr Marjory Lies).  Phone rep called pt and left a brief vm asking pt to call so that her appointment can be properly scheduled with a MD.  This is FYI to POD 3

## 2023-08-30 NOTE — Telephone Encounter (Signed)
 Pt called to check on if referral has been sent. Patient has not received a call from [facility].  Contacted [facility] to verify if they have received referral. Referral Coordinator said do not have referral, can fax to [phone].

## 2023-09-02 ENCOUNTER — Ambulatory Visit: Payer: Self-pay | Admitting: Surgery

## 2023-09-02 NOTE — Progress Notes (Signed)
 Surgery orders requested via Epic inbox.

## 2023-09-05 ENCOUNTER — Telehealth: Payer: Self-pay | Admitting: Adult Health

## 2023-09-05 NOTE — Telephone Encounter (Signed)
 Pt called stating that she is needing a referral to a chiropractor. I informed the pt that I would send a note to the nurse regarding that referral. Then pt proceeded to ask when her next appt was and I informed her that she did not have an appt with our office. She stated that she had one scheduled and when I looked in past it showed that she had one but it was r/s and the new one that was r/s was cx. Pt proceeded to tell me that I did not know what I was talking about and that I was wrong. I informed her that I can only tell her what the comp said. She became sarcastic and started singing her complaint. After that she became even nastier and I proceeded to tell her that I would send the nurse a note about the chiropractor and about wanting to r/s her appt. She asked what my name was and I stated and spelt it for her. She started yelling saying that when I answered the phone I said my name was Fleet Contras. I informed her that I did not even have an R in my name so there is no way I could have said Fleet Contras. She continued to be sarcastic and conversation was terminated.

## 2023-09-05 NOTE — Telephone Encounter (Signed)
 Patient was previously seen for migraines by Dr. Delena Bali and at prior visit, she wanted to discuss right facial pain and nose bleeds (see prior OV note from 05/30/2023). Her migraines are currently being well-controlled on topiramate and metoprolol managed by PCP.  Referrals have been placed several times for chiropractor (although typically referrals are not needed) and most recently recent to [facility] (per patient request) on 3/11.  Due to patient complaining of new symptoms from what she was initially seen for by Dr. Delena Bali, is requested she be seen by MD for further evaluation.  She was scheduled with Dr. Terrace Arabia next month but patient canceled for unclear reason.  At this point, patient can return back to PCP for ongoing management of migraines as well as cervicalgia complaints. Please advise patient. Thank you.

## 2023-09-06 NOTE — Patient Instructions (Signed)
 SURGICAL WAITING ROOM VISITATION  Patients having surgery or a procedure may have no more than 2 support people in the waiting area - these visitors may rotate.    Children under the age of 9 must have an adult with them who is not the patient.  Due to an increase in RSV and influenza rates and associated hospitalizations, children ages 10 and under may not visit patients in [facility] hospitals.  Visitors with respiratory illnesses are discouraged from visiting and should remain at home.  If the patient needs to stay at the hospital during part of their recovery, the visitor guidelines for inpatient rooms apply. Pre-op nurse will coordinate an appropriate time for 1 support person to accompany patient in pre-op.  This support person may not rotate.    Please refer to the [facility] website for the visitor guidelines for Inpatients (after your surgery is over and you are in a regular room).       Your procedure is scheduled on:  09/16/23    Report to [facility] Main Entrance    Report to admitting at    1030AM   Call this number if you have problems the morning of surgery [phone]   Do not eat food or drink liquids :After Midnight.         Fleets enema nite before and am of surgery               If you have questions, please contact your surgeon's office.   FOLLOW BOWEL PREP AND ANY ADDITIONAL PRE OP INSTRUCTIONS YOU RECEIVED FROM YOUR SURGEON'S OFFICE!!!     Oral Hygiene is also important to reduce your risk of infection.                                    Remember - BRUSH YOUR TEETH THE MORNING OF SURGERY WITH YOUR REGULAR TOOTHPASTE  DENTURES WILL BE REMOVED PRIOR TO SURGERY PLEASE DO NOT APPLY "Poly grip" OR ADHESIVES!!!   Do NOT smoke after Midnight   Stop all vitamins and herbal supplements 7 days before surgery.   Take these medicines the morning of surgery with A SIP OF WATER:  inhalers as usual and bring,m nebulizer if needed, gabaPentin,  synthrod, toprol, protonix   DO NOT TAKE ANY ORAL DIABETIC MEDICATIONS DAY OF YOUR SURGERY  Bring CPAP mask and tubing day of surgery.                              You may not have any metal on your body including hair pins, jewelry, and body piercing             Do not wear make-up, lotions, powders, perfumes/cologne, or deodorant  Do not wear nail polish including gel and S&S, artificial/acrylic nails, or any other type of covering on natural nails including finger and toenails. If you have artificial nails, gel coating, etc. that needs to be removed by a nail salon please have this removed prior to surgery or surgery may need to be canceled/ delayed if the surgeon/ anesthesia feels like they are unable to be safely monitored.   Do not shave  48 hours prior to surgery.               Men may shave face and neck.   Do not bring valuables to the hospital. [facility] IS NOT             RESPONSIBLE   FOR VALUABLES.   Contacts, glasses, dentures or bridgework may not be worn into surgery.   Bring small overnight bag day of surgery.   DO NOT BRING YOUR HOME MEDICATIONS TO THE HOSPITAL. PHARMACY WILL DISPENSE MEDICATIONS LISTED ON YOUR MEDICATION LIST TO YOU DURING YOUR ADMISSION IN THE HOSPITAL!    Patients discharged on the day of surgery will not be allowed to drive home.  Someone NEEDS to stay with you for the first 24 hours after anesthesia.   Special Instructions: Bring a copy of your healthcare power of attorney and living will documents the day of surgery if you haven't scanned them before.              Please read over the following fact sheets you were given: IF YOU HAVE QUESTIONS ABOUT YOUR PRE-OP INSTRUCTIONS PLEASE CALL [phone]   If you received a COVID test during your pre-op visit  it is requested that you wear a mask when out in public, stay away from anyone that may not be feeling well and notify your surgeon if you develop symptoms. If you test positive for Covid or  have been in contact with anyone that has tested positive in the last 10 days please notify you surgeon.    [place] - Preparing for Surgery Before surgery, you can play an important role.  Because skin is not sterile, your skin needs to be as free of germs as possible.  You can reduce the number of germs on your skin by washing with CHG (chlorahexidine gluconate) soap before surgery.  CHG is an antiseptic cleaner which kills germs and bonds with the skin to continue killing germs even after washing. Please DO NOT use if you have an allergy to CHG or antibacterial soaps.  If your skin becomes reddened/irritated stop using the CHG and inform your nurse when you arrive at Short Stay. Do not shave (including legs and underarms) for at least 48 hours prior to the first CHG shower.  You may shave your face/neck. Please follow these instructions carefully:  1.  Shower with CHG Soap the night before surgery and the  morning of Surgery.  2.  If you choose to wash your hair, wash your hair first as usual with your  normal  shampoo.  3.  After you shampoo, rinse your hair and body thoroughly to remove the  shampoo.                           4.  Use CHG as you would any other liquid soap.  You can apply chg directly  to the skin and wash                       Gently with a scrungie or clean washcloth.  5.  Apply the CHG Soap to your body ONLY FROM THE NECK DOWN.   Do not use on face/ open                           Wound or open sores. Avoid contact with eyes, ears mouth and genitals (private parts).                       Wash face,  Genitals (private parts) with your normal soap.  6.  Wash thoroughly, paying special attention to the area where your surgery  will be performed.  7.  Thoroughly rinse your body with warm water from the neck down.  8.  DO NOT shower/wash with your normal soap after using and rinsing off  the CHG Soap.                9.  Pat yourself dry with a clean towel.             10.  Wear clean pajamas.            11.  Place clean sheets on your bed the night of your first shower and do not  sleep with pets. Day of Surgery : Do not apply any lotions/deodorants the morning of surgery.  Please wear clean clothes to the hospital/surgery center.  FAILURE TO FOLLOW THESE INSTRUCTIONS MAY RESULT IN THE CANCELLATION OF YOUR SURGERY PATIENT SIGNATURE_________________________________  NURSE SIGNATURE__________________________________  ________________________________________________________________________

## 2023-09-06 NOTE — Telephone Encounter (Signed)
 Has attempted to contact [facility]. Line is busy, could not leave a voicemail

## 2023-09-08 NOTE — Telephone Encounter (Signed)
 error

## 2023-09-09 ENCOUNTER — Other Ambulatory Visit: Payer: Self-pay

## 2023-09-09 ENCOUNTER — Encounter (HOSPITAL_COMMUNITY)
Admission: RE | Admit: 2023-09-09 | Discharge: 2023-09-09 | Disposition: A | Discharge status: Home / Self Care | Payer: 59 | Source: Ambulatory Visit | Attending: Surgery | Admitting: Surgery

## 2023-09-09 ENCOUNTER — Encounter (HOSPITAL_COMMUNITY): Payer: Self-pay

## 2023-09-09 DIAGNOSIS — Z01812 Encounter for preprocedural laboratory examination: Secondary | ICD-10-CM | POA: Insufficient documentation

## 2023-09-09 DIAGNOSIS — Z01818 Encounter for other preprocedural examination: Secondary | ICD-10-CM

## 2023-09-09 HISTORY — DX: Depression, unspecified: F32.A

## 2023-09-09 NOTE — Progress Notes (Addendum)
 Anesthesia Review:  PCP:  Gillermina Phy  Cardiologist : Norman Herrlich L[facility] 06/08/2019  Neurology- Ihor Austin [facility] 05/30/23   PPM/ ICD: Device Orders: Rep Notified:  Chest x-ray : EKG : 04/04/23  CT Cors- 2020  Echo : 2020  Stress test: Cardiac Cath :   Activity level:  Sleep Study/ CPAP : Fasting Blood Sugar :      / Checks Blood Sugar -- times a day:  \\ DM- type  Hgba1c- 07/18/23- 5.2   Blood Thinner/ Instructions /Last Dose: ASA / Instructions/ Last Dose :    PT no show for preop appt on 09/09/23.  Called pt and she was at home and stated she had transportation problems.  Reviewed med hx and preop instructions with pt via phone call appt.  When asked pt about transportation home after surgery pt was unsure? Marland Kitchen  Instructed pt that she could Benedetto Goad drive her home but she would have to have a responsible adult with her in the vehicle and responsible Adult to stay with her for 24 hours after surgery.  PT surgery was cancelled before due to transportation issues.  AT end of preop phone appt pt was instructed to call Admitting at [phone].  PT voiced understanding.     PT states at end of phone call she will arrive on DOS at 1000am.

## 2023-09-14 NOTE — Telephone Encounter (Signed)
 Pt has been scheduled with Dr. Terrace Arabia on 11/10/23 at 1:30 pm. Added patient to waitlist.

## 2023-09-15 ENCOUNTER — Ambulatory Visit: Payer: 59 | Admitting: Adult Health

## 2023-09-16 ENCOUNTER — Encounter (HOSPITAL_COMMUNITY): Payer: Self-pay | Admitting: Surgery

## 2023-09-16 ENCOUNTER — Other Ambulatory Visit: Payer: Self-pay

## 2023-09-16 ENCOUNTER — Ambulatory Visit (HOSPITAL_COMMUNITY)
Admission: RE | Admit: 2023-09-16 | Discharge: 2023-09-16 | Disposition: A | Discharge status: Home / Self Care | Payer: 59 | Attending: Surgery | Admitting: Surgery

## 2023-09-16 ENCOUNTER — Ambulatory Visit (HOSPITAL_COMMUNITY): Admitting: Physician Assistant

## 2023-09-16 ENCOUNTER — Encounter (HOSPITAL_COMMUNITY)
Admission: RE | Disposition: A | Discharge status: Home / Self Care | Payer: Self-pay | Source: Home / Self Care | Attending: Surgery

## 2023-09-16 ENCOUNTER — Ambulatory Visit (HOSPITAL_BASED_OUTPATIENT_CLINIC_OR_DEPARTMENT_OTHER): Admitting: Anesthesiology

## 2023-09-16 DIAGNOSIS — K60313 Anal fistula, simple, recurrent: Secondary | ICD-10-CM | POA: Diagnosis not present

## 2023-09-16 DIAGNOSIS — M329 Systemic lupus erythematosus, unspecified: Secondary | ICD-10-CM | POA: Insufficient documentation

## 2023-09-16 DIAGNOSIS — J45909 Unspecified asthma, uncomplicated: Secondary | ICD-10-CM

## 2023-09-16 DIAGNOSIS — K219 Gastro-esophageal reflux disease without esophagitis: Secondary | ICD-10-CM | POA: Insufficient documentation

## 2023-09-16 DIAGNOSIS — K60329 Anal fistula, complex, unspecified: Secondary | ICD-10-CM | POA: Diagnosis not present

## 2023-09-16 DIAGNOSIS — J4540 Moderate persistent asthma, uncomplicated: Secondary | ICD-10-CM | POA: Diagnosis not present

## 2023-09-16 DIAGNOSIS — R519 Headache, unspecified: Secondary | ICD-10-CM | POA: Diagnosis not present

## 2023-09-16 DIAGNOSIS — I10 Essential (primary) hypertension: Secondary | ICD-10-CM

## 2023-09-16 DIAGNOSIS — Z01818 Encounter for other preprocedural examination: Secondary | ICD-10-CM

## 2023-09-16 DIAGNOSIS — Z87891 Personal history of nicotine dependence: Secondary | ICD-10-CM | POA: Insufficient documentation

## 2023-09-16 DIAGNOSIS — F32A Depression, unspecified: Secondary | ICD-10-CM | POA: Diagnosis not present

## 2023-09-16 DIAGNOSIS — F419 Anxiety disorder, unspecified: Secondary | ICD-10-CM | POA: Insufficient documentation

## 2023-09-16 DIAGNOSIS — K603 Anal fistula, unspecified: Secondary | ICD-10-CM | POA: Insufficient documentation

## 2023-09-16 DIAGNOSIS — L4050 Arthropathic psoriasis, unspecified: Secondary | ICD-10-CM | POA: Insufficient documentation

## 2023-09-16 DIAGNOSIS — K449 Diaphragmatic hernia without obstruction or gangrene: Secondary | ICD-10-CM | POA: Insufficient documentation

## 2023-09-16 DIAGNOSIS — E039 Hypothyroidism, unspecified: Secondary | ICD-10-CM | POA: Insufficient documentation

## 2023-09-16 DIAGNOSIS — M069 Rheumatoid arthritis, unspecified: Secondary | ICD-10-CM | POA: Diagnosis not present

## 2023-09-16 LAB — CBC
HCT: 35.5 % — ABNORMAL LOW (ref 36.0–46.0)
Hemoglobin: 11.7 g/dL — ABNORMAL LOW (ref 12.0–15.0)
MCH: 32.4 pg (ref 26.0–34.0)
MCHC: 33 g/dL (ref 30.0–36.0)
MCV: 98.3 fL (ref 80.0–100.0)
Platelets: 278 10*3/uL (ref 150–400)
RBC: 3.61 MIL/uL — ABNORMAL LOW (ref 3.87–5.11)
RDW: 14.6 % (ref 11.5–15.5)
WBC: 5 10*3/uL (ref 4.0–10.5)
nRBC: 0 % (ref 0.0–0.2)

## 2023-09-16 LAB — BASIC METABOLIC PANEL WITH GFR
Anion gap: 9 (ref 5–15)
BUN: 17 mg/dL (ref 6–20)
CO2: 23 mmol/L (ref 22–32)
Calcium: 9.3 mg/dL (ref 8.9–10.3)
Chloride: 108 mmol/L (ref 98–111)
Creatinine, Ser: 0.91 mg/dL (ref 0.44–1.00)
GFR, Estimated: >60 mL/min (ref >60)
Glucose, Bld: 84 mg/dL (ref 70–99)
Potassium: 3.5 mmol/L (ref 3.5–5.1)
Sodium: 140 mmol/L (ref 135–145)

## 2023-09-16 LAB — GLUCOSE, CAPILLARY: Glucose-Capillary: 65 mg/dL — ABNORMAL LOW (ref 70–99)

## 2023-09-16 SURGERY — LIGATION, INTERNAL FISTULA TRACT
Anesthesia: General

## 2023-09-16 MED ORDER — BUPIVACAINE-EPINEPHRINE (PF) 0.25% -1:200000 IJ SOLN
INTRAMUSCULAR | Status: AC
  Filled 2023-09-16: qty 30

## 2023-09-16 MED ORDER — ROCURONIUM BROMIDE 100 MG/10ML IV SOLN
INTRAVENOUS | Status: DC | PRN
  Administered 2023-09-16: 50 mg via INTRAVENOUS

## 2023-09-16 MED ORDER — AMISULPRIDE (ANTIEMETIC) 5 MG/2ML IV SOLN: 10.0000 mg | Freq: Once | INTRAVENOUS | Status: DC | PRN

## 2023-09-16 MED ORDER — CHLORHEXIDINE GLUCONATE CLOTH 2 % EX PADS: 6.0000 | MEDICATED_PAD | Freq: Once | CUTANEOUS | Status: DC

## 2023-09-16 MED ORDER — SODIUM CHLORIDE 0.9 % IV SOLN
1.0000 g | INTRAVENOUS | Status: AC
  Administered 2023-09-16: 1 g via INTRAVENOUS
  Filled 2023-09-16: qty 1000

## 2023-09-16 MED ORDER — SUGAMMADEX SODIUM 200 MG/2ML IV SOLN
INTRAVENOUS | Status: AC
  Filled 2023-09-16: qty 2

## 2023-09-16 MED ORDER — MIDAZOLAM HCL 2 MG/2ML IJ SOLN
INTRAMUSCULAR | Status: AC
  Filled 2023-09-16: qty 2

## 2023-09-16 MED ORDER — PROPOFOL 10 MG/ML IV BOLUS
INTRAVENOUS | Status: DC | PRN
  Administered 2023-09-16: 80 mg via INTRAVENOUS

## 2023-09-16 MED ORDER — ONDANSETRON HCL 4 MG/2ML IJ SOLN
INTRAMUSCULAR | Status: DC | PRN
  Administered 2023-09-16: 4 mg via INTRAVENOUS

## 2023-09-16 MED ORDER — BUPIVACAINE-EPINEPHRINE (PF) 0.25% -1:200000 IJ SOLN
INTRAMUSCULAR | Status: DC | PRN
  Administered 2023-09-16: 30 mL

## 2023-09-16 MED ORDER — BUPIVACAINE LIPOSOME 1.3 % IJ SUSP
INTRAMUSCULAR | Status: AC
  Filled 2023-09-16: qty 20

## 2023-09-16 MED ORDER — EPHEDRINE SULFATE-NACL 50-0.9 MG/10ML-% IV SOSY
PREFILLED_SYRINGE | INTRAVENOUS | Status: DC | PRN
  Administered 2023-09-16: 7.5 mg via INTRAVENOUS

## 2023-09-16 MED ORDER — FLEET ENEMA RE ENEM
1.0000 | ENEMA | Freq: Once | RECTAL | Status: DC
  Filled 2023-09-16: qty 1

## 2023-09-16 MED ORDER — FENTANYL CITRATE (PF) 100 MCG/2ML IJ SOLN
INTRAMUSCULAR | Status: DC | PRN
Start: 2023-09-16 — End: 2023-09-16
  Administered 2023-09-16 (×2): 50 ug via INTRAVENOUS

## 2023-09-16 MED ORDER — CHLORHEXIDINE GLUCONATE 0.12 % MT SOLN
15.0000 mL | Freq: Once | OROMUCOSAL | Status: AC
  Administered 2023-09-16: 15 mL via OROMUCOSAL

## 2023-09-16 MED ORDER — ORAL CARE MOUTH RINSE: 15.0000 mL | Freq: Once | OROMUCOSAL | Status: AC

## 2023-09-16 MED ORDER — SUGAMMADEX SODIUM 200 MG/2ML IV SOLN
INTRAVENOUS | Status: DC | PRN
  Administered 2023-09-16: 200 mg via INTRAVENOUS

## 2023-09-16 MED ORDER — ONDANSETRON HCL 4 MG/2ML IJ SOLN: 4.0000 mg | Freq: Once | INTRAMUSCULAR | Status: DC | PRN

## 2023-09-16 MED ORDER — TRAMADOL HCL 50 MG PO TABS: 50.0000 mg | ORAL_TABLET | Freq: Four times a day (QID) | ORAL | 0 refills | Status: AC | PRN

## 2023-09-16 MED ORDER — ACETAMINOPHEN 500 MG PO TABS
1000.0000 mg | ORAL_TABLET | ORAL | Status: DC
  Filled 2023-09-16: qty 2

## 2023-09-16 MED ORDER — BUPIVACAINE LIPOSOME 1.3 % IJ SUSP
INTRAMUSCULAR | Status: DC | PRN
  Administered 2023-09-16: 20 mL

## 2023-09-16 MED ORDER — DEXAMETHASONE SODIUM PHOSPHATE 10 MG/ML IJ SOLN
INTRAMUSCULAR | Status: DC | PRN
  Administered 2023-09-16: 10 mg via INTRAVENOUS

## 2023-09-16 MED ORDER — LIDOCAINE HCL (CARDIAC) PF 100 MG/5ML IV SOSY
PREFILLED_SYRINGE | INTRAVENOUS | Status: DC | PRN
  Administered 2023-09-16: 60 mg via INTRAVENOUS

## 2023-09-16 MED ORDER — ACETAMINOPHEN 500 MG PO TABS
1000.0000 mg | ORAL_TABLET | Freq: Once | ORAL | Status: AC
  Administered 2023-09-16: 1000 mg via ORAL

## 2023-09-16 MED ORDER — BUPIVACAINE LIPOSOME 1.3 % IJ SUSP: 20.0000 mL | Freq: Once | INTRAMUSCULAR | Status: DC

## 2023-09-16 MED ORDER — FENTANYL CITRATE PF 50 MCG/ML IJ SOSY: 25.0000 ug | PREFILLED_SYRINGE | INTRAMUSCULAR | Status: DC | PRN

## 2023-09-16 MED ORDER — LACTATED RINGERS IV SOLN: INTRAVENOUS | Status: DC

## 2023-09-16 MED ORDER — MIDAZOLAM HCL 5 MG/5ML IJ SOLN
INTRAMUSCULAR | Status: DC | PRN
  Administered 2023-09-16: 2 mg via INTRAVENOUS

## 2023-09-16 MED ORDER — FENTANYL CITRATE (PF) 100 MCG/2ML IJ SOLN
INTRAMUSCULAR | Status: AC
  Filled 2023-09-16: qty 2

## 2023-09-16 MED ORDER — PHENYLEPHRINE 80 MCG/ML (10ML) SYRINGE FOR IV PUSH (FOR BLOOD PRESSURE SUPPORT)
PREFILLED_SYRINGE | INTRAVENOUS | Status: DC | PRN
Start: 2023-09-16 — End: 2023-09-16
  Administered 2023-09-16 (×3): 160 ug via INTRAVENOUS
  Administered 2023-09-16: 80 ug via INTRAVENOUS
  Administered 2023-09-16: 160 ug via INTRAVENOUS

## 2023-09-16 MED ORDER — 0.9 % SODIUM CHLORIDE (POUR BTL) OPTIME
TOPICAL | Status: DC | PRN
  Administered 2023-09-16: 1000 mL

## 2023-09-16 SURGICAL SUPPLY — 33 items
BAG COUNTER SPONGE SURGICOUNT (BAG) IMPLANT
BENZOIN TINCTURE PRP APPL 2/3 (GAUZE/BANDAGES/DRESSINGS) IMPLANT
BLADE SURG 15 STRL LF DISP TIS (BLADE) IMPLANT
BRIEF MESH DISP LRG (UNDERPADS AND DIAPERS) ×1 IMPLANT
COVER SURGICAL LIGHT HANDLE (MISCELLANEOUS) ×1 IMPLANT
DISSECTOR SURG LIGASURE 21 (MISCELLANEOUS) IMPLANT
DRAPE LAPAROTOMY T 102X78X121 (DRAPES) ×1 IMPLANT
ELECT PENCIL ROCKER SW 15FT (MISCELLANEOUS) IMPLANT
ELECT REM PT RETURN 15FT ADLT (MISCELLANEOUS) ×1 IMPLANT
GAUZE 4X4 16PLY ~~LOC~~+RFID DBL (SPONGE) ×1 IMPLANT
GAUZE PAD ABD 8X10 STRL (GAUZE/BANDAGES/DRESSINGS) IMPLANT
GAUZE SPONGE 4X4 12PLY STRL (GAUZE/BANDAGES/DRESSINGS) IMPLANT
GLOVE BIO SURGEON STRL SZ7.5 (GLOVE) ×1 IMPLANT
GLOVE INDICATOR 8.0 STRL GRN (GLOVE) ×1 IMPLANT
GOWN STRL REUS W/ TWL XL LVL3 (GOWN DISPOSABLE) ×1 IMPLANT
KIT BASIN OR (CUSTOM PROCEDURE TRAY) ×1 IMPLANT
KIT TURNOVER KIT A (KITS) IMPLANT
LOOP VESSEL MAXI BLUE (MISCELLANEOUS) IMPLANT
NDL HYPO 22X1.5 SAFETY MO (MISCELLANEOUS) ×1 IMPLANT
NEEDLE HYPO 22X1.5 SAFETY MO (MISCELLANEOUS) ×1 IMPLANT
PACK BASIC VI WITH GOWN DISP (CUSTOM PROCEDURE TRAY) ×1 IMPLANT
RETRACTOR RING URO 16.6X16.6 (MISCELLANEOUS) IMPLANT
RETRACTOR STAY HOOK 5MM (MISCELLANEOUS) IMPLANT
SHEARS HARMONIC 9CM CVD (BLADE) IMPLANT
SPIKE FLUID TRANSFER (MISCELLANEOUS) ×1 IMPLANT
SURGILUBE 2OZ TUBE FLIPTOP (MISCELLANEOUS) ×1 IMPLANT
SUT CHROMIC 2 0 SH (SUTURE) IMPLANT
SUT CHROMIC 3 0 SH 27 (SUTURE) IMPLANT
SUT VIC AB 2-0 SH 27X BRD (SUTURE) ×2 IMPLANT
SUT VIC AB 2-0 UR6 27 (SUTURE) IMPLANT
SUT VIC AB 3-0 SH 27X BRD (SUTURE) IMPLANT
SYR 20ML LL LF (SYRINGE) ×1 IMPLANT
TOWEL OR 17X26 10 PK STRL BLUE (TOWEL DISPOSABLE) ×1 IMPLANT

## 2023-09-16 NOTE — Op Note (Signed)
 09/16/2023  1:03 PM  PATIENT:  Caprice Red  58 y.o. female  Patient Care Team: Norm Salt, PA as PCP - General (Physician Assistant) Baldo Daub, MD as PCP - Cardiology (Cardiology)  PRE-OPERATIVE DIAGNOSIS:  Transsphincteric anal fistula  POST-OPERATIVE DIAGNOSIS:  Transsphincteric anal fistula; 2nd subcutaneous anal fistula  PROCEDURE:   Surgical treatment of transsphincteric anal fistula with LIFT (Ligation of intersphincteric fistulous tract) Partial fistulotomy of subcutaneous fistula Anorectal exam under anesthesia  SURGEON:  Surgeon(s): Andria Meuse, MD  ASSISTANT: OR Staff   ANESTHESIA:   local and general  SPECIMEN:  none  DISPOSITION OF SPECIMEN:  N/A  COUNTS:  Sponge, needle, and instrument counts were reported correct x2 at conclusion.  EBL: 10 mL  Drains: None  PLAN OF CARE: Discharge to home after PACU  PATIENT DISPOSITION:  PACU - hemodynamically stable.  OR FINDINGS: Transsphincteric fistula with indwelling seton; subcutaneous fistula with a second indwelling seton.  No abscesses or perianal inflammation.  Anoscopy only significant for the pre-existing transsphincteric anal fistula in a posterior position within internal opening at the level of the dentate.  Ligation of intersphincteric fistula tract carried out uneventfully.  Externally, there is a subcutaneous tract emanating towards the posterior midline and the post sacral space.  This was managed with a fistulotomy.  DESCRIPTION: The patient was identified in the preoperative holding area and taken to the OR. SCDs were applied.  She then underwent general endotracheal anesthesia without difficulty. The patient was then rolled onto the OR table in the prone jackknife position. Pressure points were then evaluated and padded. Benzoin was applied to the buttocks and they were gently taped apart.  She was then prepped and draped in usual sterile fashion.  A surgical timeout was  performed indicating the correct patient, procedure, and positioning.  A perianal block was then created using a dilute mixture of 0.25% Marcaine with epinephrine and Exparel.  After ascertaining an appropriate level of anesthesia had been achieved, a well lubricated digital rectal exam was performed. This demonstrated no palpable abnormalities.  Externally, she has 2 indwelling vessel loop setons.  1 of these is controlling a transsphincteric fistula.  The other is controlling a subcutaneous fistula or one of the openings that shared with the external opening of her transsphincteric fistula.  A Hill-Ferguson anoscope was inserted into the anal canal and circumferential inspection demonstrated healthy appearing anoderm.  Transsphincteric anal fistula is an internal opening at the level of the dentate line.  There is no erythema, fluctuance, or any evidence of perianal infection.  We began first with a LIFT procedure.  The internal/external sphincter muscles were palpated.  The intersphincteric groove was identified.  A curvilinear incision is created in the skin overlying the intersphincteric groove.  The plane is then deepened using electrocautery.  We maintained a plane between both the internal and external sphincter muscle and in doing so divided no muscle fibers.  The seton was exchanged for a semirigid fistula probe.  The fistula is then carefully circumferentially dissected.  The fistula is well-defined without any apparent false tracts.  We had a nice dissection around the fistula.  This is then doubly clamped and divided between clamps.  Each respective end is then suture-ligated using 2-0 Vicryl sutures x 2.  A crypt hook is then used to carefully cannulate the internal opening of the fistula.  From this approach, the fistula is been successfully excluded.  From the external opening, the fistula probe was inserted and demonstrates  we have also successfully ligated the fistula from this approach.   Granulation tissue at the external opening was cauterized.  The wound was irrigated.  Hemostasis is verified.  The wound is then closed in layers using a 2-0 Vicryl deep dermal type suture reapproximating the intersphincteric plane.  3-0 chromic sutures were then used to approximate the skin.  Attention is then turned to the second fistula she has which shares a opening externally but did not communicate with the anal canal.  The pre-existing seton is removed.  The fistula probe was insinuated.  There is no sphincter muscle overlying this.  This is more external over the post sacral space.  This is a short tract shallow fistula, subcutaneous in nature.  Therefore, we opted to proceed with a fistulotomy type approach for this fistula.  Over the fistula probe, the skin is incised and the underlying subcutaneous tissues divided electrocautery.  All granulation tissue was then fulgurated electrocautery.  The wound is washed.  Hemostasis is verified.  Additional local anesthetic is infiltrated at the surgical incisions.  All sponge, needle, and instrument counts are reported correct.  The wounds are washed and dried.  A dressing consisting of a moist 4 x 4, additional 4 x 4's, ABD, and mesh underwear is placed.  The buttocks are untaped.  She was then rolled back onto a stretcher, awakened from anesthesia, extubated, and transferred to recovery in satisfactory condition.  DISPOSITION: PACU in satisfactory condition.

## 2023-09-16 NOTE — Anesthesia Procedure Notes (Signed)
 Procedure Name: Intubation Date/Time: 09/16/2023 12:05 PM  Performed by: Randa Evens, CRNAPre-anesthesia Checklist: Patient identified, Emergency Drugs available, Suction available and Patient being monitored Patient Re-evaluated:Patient Re-evaluated prior to induction Oxygen Delivery Method: Circle System Utilized Preoxygenation: Pre-oxygenation with 100% oxygen Induction Type: IV induction Ventilation: Mask ventilation without difficulty Laryngoscope Size: Mac and 3 Grade View: Grade I Tube type: Oral Tube size: 7.0 mm Number of attempts: 1 Airway Equipment and Method: Stylet and Oral airway Placement Confirmation: ETT inserted through vocal cords under direct vision, positive ETCO2 and breath sounds checked- equal and bilateral Secured at: 21 cm Tube secured with: Tape Dental Injury: Teeth and Oropharynx as per pre-operative assessment

## 2023-09-16 NOTE — Transfer of Care (Signed)
 Immediate Anesthesia Transfer of Care Note  Patient: [name]  Procedure(s) Performed: LIGATION, INTERNAL FISTULA TRACT, PARTIAL FISTULOTOMY ANORECTAL EXAM UNDER ANESTHESIA  Patient Location: PACU  Anesthesia Type:General  Level of Consciousness: awake, alert , and oriented  Airway & Oxygen Therapy: Patient Spontanous Breathing  Post-op Assessment: Report given to RN  Post vital signs: Reviewed and stable  Last Vitals:  Vitals Value Taken Time  BP 149/89 09/16/23 1316  Temp    Pulse    Resp 24 09/16/23 1322  SpO2    Vitals shown include unfiled device data.  Last Pain:  Vitals:   09/16/23 1133  TempSrc:   PainSc: 0-No pain         Complications: No notable events documented.

## 2023-09-16 NOTE — Anesthesia Preprocedure Evaluation (Addendum)
 Anesthesia Evaluation  Patient identified by MRN, date of birth, ID band Patient awake    Reviewed: Allergy & Precautions, NPO status , Patient's Chart, lab work & pertinent test results, reviewed documented beta blocker date and time   Airway Mallampati: II  TM Distance: >3 FB Neck ROM: Full    Dental  (+) Dental Advisory Given, Edentulous Upper, Edentulous Lower   Pulmonary asthma , former smoker   Pulmonary exam normal breath sounds clear to auscultation       Cardiovascular hypertension, Pt. on home beta blockers (-) angina (-) Past MI Normal cardiovascular exam Rhythm:Regular Rate:Normal     Neuro/Psych  Headaches PSYCHIATRIC DISORDERS Anxiety Depression       GI/Hepatic Neg liver ROS, hiatal hernia,GERD  Medicated,, TRANSSPHINCTERIC ANAL FISTULA   Endo/Other  Hypothyroidism  systemic lupus erythematosus   Renal/GU Renal disease     Musculoskeletal  (+) Arthritis ,    Abdominal   Peds  Hematology negative hematology ROS (+)   Anesthesia Other Findings Day of surgery medications reviewed with the patient.  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   Reproductive/Obstetrics                              Anesthesia Physical Anesthesia Plan  ASA: 3  Anesthesia Plan: General   Post-op Pain Management: Tylenol PO (pre-op)*   Induction: Intravenous  PONV Risk Score and Plan: 3 and Midazolam, Dexamethasone and Ondansetron  Airway Management Planned: Oral ETT  Additional Equipment:   Intra-op Plan:   Post-operative Plan: Extubation in OR  Informed Consent: I have reviewed the patients History and Physical, chart, labs and discussed the procedure including the risks, benefits and alternatives for the proposed anesthesia with the patient or authorized representative who has indicated his/her understanding and acceptance.     Dental advisory given  Plan  Discussed with: CRNA  Anesthesia Plan Comments:          Anesthesia Quick Evaluation

## 2023-09-16 NOTE — Discharge Instructions (Addendum)
 ANORECTAL SURGERY: POST OP INSTRUCTIONS  DIET: Follow a light bland diet the first 24 hours after arrival home, such as soup, liquids, crackers, etc.  Be sure to include lots of fluids daily.  Avoid fast food or heavy meals as your are more likely to get nauseated.  Eat a low fat diet the next few days after surgery.   Some bleeding with bowel movements is expected for the first couple of days but this should stop in between bowel movements  Take your usually prescribed home medications unless otherwise directed. No foreign bodies per rectum for the next 3 months (enemas, etc) Wound care: The deeper fistula surgery site has been closed with absorbable/dissolving sutures.  Nothing will need to be removed.  The more shallow fistula was treated with a fistulotomy type approach.  This leaves you with an approximate 1 inch or so wound.  This will heal on its own over the next 1 to 2 months.  It is okay to bathe and get this wet in the bath as you had planned.  Cover this with gauze and change at least once a day until the wound has fully healed.  PAIN CONTROL: It is helpful to take an over-the-counter pain medication regularly for the first few days/weeks.  Choose from the following that works best for you: Ibuprofen (Advil, etc) Three 200mg  tabs every 6 hours as needed. Acetaminophen (Tylenol, etc) 500-650mg  every 6 hours as needed NOTE: You may take both of these medications together - most patients find it most helpful when alternating between the two (i.e. Ibuprofen at 6am, tylenol at 9am, ibuprofen at 12pm ..Marland Kitchen) A  prescription for pain medication may have been prescribed for you at discharge.  Take your pain medication as prescribed.  If you are having problems/concerns with the prescription medicine, please call us for further advice.  Avoid getting constipated.  Between the surgery and the pain medications, it is common to experience some constipation.  Increasing fluid intake (64oz of water per  day) and taking a fiber supplement (such as Metamucil, Citrucel, FiberCon) 1-2 times a day regularly will usually help prevent this problem from occurring.  Take Miralax (over the counter) 1-2x/day while taking a narcotic pain medication. If no bowel movement after 48hours, you may additionally take a laxative like a bottle of Milk of Magnesia which can be purchased over the counter. Avoid enemas.   Watch out for diarrhea.  If you have many loose bowel movements, simplify your diet to bland foods.  Stop any stool softeners and decrease your fiber supplement. If this worsens or does not improve, please call us.  Wash / shower every day.  If you were discharged with a dressing, you may remove this the day after your surgery. You may shower normally, getting soap/water on your wound, particularly after bowel movements.  Soaking in a warm bath filled a couple inches ("Sitz bath") is a great way to clean the area after a bowel movement and many patients find it is a way to soothe the area.  ACTIVITIES as tolerated:   You may resume regular (light) daily activities beginning the next day--such as daily self-care, walking, climbing stairs--gradually increasing activities as tolerated.  If you can walk 30 minutes without difficulty, it is safe to try more intense activity such as jogging, treadmill, bicycling, low-impact aerobics, etc. Refrain from any heavy lifting or straining for the first 2 weeks after your procedure, particularly if your surgery was for hemorrhoids. Avoid activities that make your  pain worse You may drive when you are no longer taking prescription pain medication, you can comfortably wear a seatbelt, and you can safely maneuver your car and apply brakes.  FOLLOW UP in our office Please call [facility] at [phone] to set up an appointment to see your surgeon in the office for a follow-up appointment approximately 2 weeks after your surgery. Make sure that you call for this appointment the  day you arrive home to insure a convenient appointment time.  9. If you have disability or family leave forms that need to be completed, you may have them completed by your primary care physician's office; for return to work instructions, please ask our office staff and they will be happy to assist you in obtaining this documentation   When to call us [phone]: Poor pain control Reactions / problems with new medications (rash/itching, etc)  Fever over 101.5 F (38.5 C) Inability to urinate Nausea/vomiting Worsening swelling or bruising Continued bleeding from incision. Increased pain, redness, or drainage from the incision  The clinic staff is available to answer your questions during regular business hours (8:30am-5pm).  Please don't hesitate to call and ask to speak to one of our nurses for clinical concerns.   A surgeon from [facility] is always on call at the hospitals   If you have a medical emergency, go to the nearest emergency room or call 911.   [facility] [address], [place], [place]  [address] MAIN: [phone] FAX: [phone] [email or web address]

## 2023-09-16 NOTE — H&P (Signed)
 CC: Here today for surgery  HPI: [name] is an 58 y.o. female with history of Hypothyroidism, lupus, psoriasis, rheumatoid arthritis, whom is seen in the office today for follow-up.  Colonoscopy with Dr. Rhea Belton 01/2021: - Diverticulosis in sigmoid - Exam otherwise normal - No specimens. - Repeat colonoscopy in 10 years for screening purposes.  She saw my partner, Dr. Janee Morn, 01/26/2023 for evaluation. He was seeing her for a possible perianal cyst. He noted she had a history of lupus and psoriatic arthritis and has been taking prednisone and cosympic. She reports a many year history of intermittent left perianal lumps that will swell and then drain. Symptoms seem to be exacerbated if she is constipated.  She has never had any prior anorectal surgeries or procedures.  She did have a pelvic ultrasound 12/31/2022 with Dr. Rhea Belton that demonstrated a complex 2.7 cm subcutaneous fluid collection at the site of palpable abnormality, with sinus tract to the skin surface. This was done within the gluteal fold.  With regards to the perianal symptoms, she notes intermittent swelling and drainage that has occurred primarily since her colonoscopy was completed back in 2022. This will swell and drain somewhat intermittently, unpredictably. She denies any prior incision/drainage type procedures or any history of any anorectal surgery or procedures. She denies any known history of inflammatory bowel disease, Crohn's, or ulcerative colitis. She also denies any known family history of this. She herself does have multiple rheumatologic conditions including both rheumatoid arthritis, lupus, and psoriasis.  She denies any evident history of anal fissures or tearing like sensations with bowel movements.  OR 04/11/23  Surgical treatment of transsphincteric anal fistula with placement of draining seton Anorectal exam under anesthesia  OR FINDINGS: Left posterior low transsphincteric anal fistula,  controlled with blue vessel loop draining seton.   INTERVAL HX OR 07/20/23 Surgical treatment of transsphincteric anal fistula with placement seton  Incision and drainage of perirectal abscess - posterior midline Anorectal exam under anesthesia  OR FINDINGS: Perirectal abscess in posterior midline post sacral space. Found to communicate with punctate tract to external opening of her known transsphincteric fistula. This tract was controlled with additional vessel loop. Indwelling vessel loop seton controlling fistula is also exchanged for fresh seton.  Returns for follow-up. Has been doing well. Setons in place. No complaints expressed. Did have a fair amount of drainage following the procedure and that has now steadily improved and has almost completely stopped. She denies any purulent drainage. She denies any perianal pain or swelling.  She denies any changes in health or health history since we met in the office. No new medications/allergies. She states she is ready for surgery today.  PMH: Hypothyroidism, lupus, psoriasis, rheumatoid arthritis  PSH: Denies any prior anorectal surgeries or procedures.  FHx: Denies any known family history of colorectal, breast, endometrial or ovarian cancer. Denies any family history of inflammatory bowel disease, Crohn's, ulcerative colitis.  Social Hx: Denies use of tobacco/EtOH/illicit drug. 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 mohamed/ radiation oncology-- dr Kathrynn Running;   dx 04/ 2006;   completed chemoradiation 07/ 2006   History of seizures    07-13-2023  pt stated as child due to head injury last one in elementary school , none since and no issue since   Hypertension    Hypothyroidism    followed by pcp   IDA (iron deficiency anemia)    Intermittent palpitations 2020   evaulated by cardiology--- dr Dulce Sellar and released by lov note in epic 06-08-2019;  normal echo 03-07-2019;  CCT/ coronary 10/ 2020 calcium score zero;  event monitor 05-01-2019 no sign arrhythmia , rare PAC/ PVC   Leiomyoma of uterus, unspecified    Lupus (systemic lupus erythematosus) (HCC)    followed by rheuomatology   Migraines    neurology-- dr Delena Bali;   takes Topamax and Maxalt   Moderate persistent asthma    followed by pcp  and as needed [facility] pulm;   (07-13-2023  pt stated last used rescue inhaler 2 weeks ago and last used nebulizer 1.5 wks ago)   Neuropathy due to chemotherapeutic drug (HCC)    Pneumonia    Psoriasis    Psoriatic arthritis of multiple joints [facility])    rheumatologist--- dr Karsten Ro ([facility] [place] [facility])  - ankles, knees, hands, neck   Wears glasses     Past Surgical History:  Procedure Laterality Date   CESAREAN SECTION  1992   COLONOSCOPY WITH ESOPHAGOGASTRODUODENOSCOPY (EGD)  02/04/2021   dr pyrtle   DEEP NECK LYMPH NODE BIOPSY / EXCISION  09/30/2004   @[facility]  by dr Lucina Mellow;    Excision biopsy, right upper neck and lymph node's  (right retropharyngeal biopsy/ right neck lymph node oropharynx biopsy)   DENTAL RESTORATION/EXTRACTION WITH X-RAY  10/21/2004   @[facility]  by dr Priscille Heidelberg;   Extraction teeth x5;  all four quadrants alveoloplasty scaling & root planing for pre-chemoradiation protocal for chronic perodonitis   DILATION AND EVACUATION  04/22/2004   @ [facility] by dr Tamela Oddi  (fetal  demise 18 wks)   ESOPHAGOGASTRODUODENOSCOPY (EGD) WITH ESOPHAGEAL DILATION  12/21/2022   dr pyrtle   INCISION AND DRAINAGE PERIRECTAL ABSCESS  07/20/2023   Procedure: IRRIGATION AND DEBRIDEMENT PERIRECTAL ABSCESS;  Surgeon: Andria Meuse, MD;  Location: [facility] ORS;  Service: General;;   LIGATION OF INTERNAL FISTULA TRACT N/A 07/20/2023   Procedure: SURGICAL TREATMENT OF TRANSSPHINCTERIC ANAL FISTULA LIGATION OF INTERNAL FISTULA TRACT AND PLACEMENT OF SETON;  Surgeon: Andria Meuse, MD;  Location: [facility];  Service: General;  Laterality: N/A;   MULTIPLE EXTRACTIONS WITH ALVEOLOPLASTY N/A 03/05/2013   Procedure: MULTIPLE EXTRACION WITH ALVEOLOPLASTY, extraction of decayed teeth numbers 3,4,5,18,19,22,23,24,25, extraction of retained root tips teeth numbers 2,20,21,26, bilateral mandibular alveoloplasty and upper right maxillary alveoloplasty;  Surgeon: Francene Finders, DDS;  Location: [facility] OR;  Service: Oral Surgery;  Laterality: N/A;   ORIF ANKLE FRACTURE Right 2000   per pt has retained hardware   PLACEMENT OF SETON N/A 04/11/2023   Procedure: PLACEMENT OF DRAINING  SETON;  Surgeon: Andria Meuse, MD;  Location: [facility] ORS;  Service: General;  Laterality: N/A;   RECTAL EXAM UNDER ANESTHESIA N/A 07/20/2023   Procedure: ANORECTAL EXAM UNDER ANESTHESIA;  Surgeon: Andria Meuse, MD;  Location: [facility] ORS;  Service: General;  Laterality: N/A;   TOOTH EXTRACTION Bilateral 03/05/2013   Procedure: EXTRACTION MOLARS;  Surgeon: Francene Finders, DDS;  Location[facility] OR;  Service: Oral Surgery;  Laterality: Bilateral;    Family History  Problem Relation Age of Onset   Hypertension Mother    Diabetes Mother    Hypertension Father    Lupus Brother    Breast cancer Maternal Grandmother 32   Colon cancer Neg Hx    Esophageal cancer Neg Hx    Rectal cancer Neg Hx    Stomach cancer Neg Hx    Colon polyps Neg Hx     Social:  reports that she quit smoking about 19 years ago. Her  smoking use included cigarettes. She started smoking about 27 years ago. She has a 2 pack-year smoking history. She has never used smokeless tobacco. She reports that she does not currently use drugs. She reports that she does not drink alcohol.  Allergies:  Allergies  Allergen Reactions   Morphine Itching   Amoxicillin Itching   Latex Itching   Oxycodone Itching   Cephalexin Rash   Ciprofloxacin Hcl Rash   Penicillins Itching and Rash   Plaquenil [Hydroxychloroquine Sulfate] Hives and Rash   Sulfamethoxazole-Trimethoprim Itching and Rash   Tetracyclines & Related Itching and Rash    Medications: I have reviewed the patient's current medications.  No results found for this or any previous visit (from the past 48 hours).  No results found.   PE Last menstrual period 06/25/2016. Constitutional: NAD; conversant Eyes: Moist conjunctiva; no lid lag; anicteric Lungs: Normal respiratory effort CV: RRR Psychiatric: Appropriate affect  No results found for this or any previous visit (from the past 48 hours).  No results found.  A/P: [name] is an 58 y.o. female with hx of hypothyroidism, lupus, rheumatoid arthritis, psoriasis here for evaluation of transsphincteric anal fistula  -With her other rheumatologic conditions, we did discuss etiologies for this with Crohn's being a certainly possible etiology but no evidence of this thus far with endoscopic evaluations.  S/p EUA/fistula treatment with draining seton 04/11/23 S/p EUA, I&D with additional seton placement 07/20/23  We spent time today again reviewing her procedure, findings, and plans moving forward. Drainage has almost completely subsided/stopped and everything looks great. We will begin hopefully planning definitive fistula surgery at this juncture for her. We discussed waiting until the end of March to do this to allow time for the tract to mature and ensure no other infectious issues.  -The anatomy and  physiology of the anal canal was discussed with the patient with associated pictures. The pathophysiology of transsphincteric anal fistulas was discussed at length with associated pictures and illustrations as it pertains to her current anatomy.  -We have reviewed options going forward including further observation with long-term/definitive seton drainage vs surgery -surgical treatment of transsphincteric anal fistula with ligation of intersphincteric fistulous tract (LIFT); anorectal exam under anesthesia  -The planned procedure, material risks (including, but not limited to, pain, bleeding, infection, scarring, need for blood transfusion, damage to anal sphincter, incontinence of gas and/or stool, need for additional procedures, anal stenosis, rare cases of pelvic sepsis which in severe cases may require things like a colostomy, recurrence, pneumonia, heart attack, stroke, death) benefits and alternatives to surgery were discussed at length. I noted a good probability that the procedure would help improve their symptoms. The patient's questions were answered to her satisfaction, she voiced understanding and elected to proceed with surgery. Additionally, we discussed typical  postoperative expectations and the recovery process.  Marin Olp, MD [facility], [facility]

## 2023-09-16 NOTE — OR Nursing (Signed)
 Discussed with Dr Desmond Lope two issues.  Patient had blood glucose of 65 upon arrival.  He said as long as patient was eating and drinking, he was ok with that.  Next, the patient had hypertension.  I called about blood pressure 165/82.  Dr Desmond Lope said that it was ok, and that the patient was appropriate for discharge.

## 2023-09-19 ENCOUNTER — Encounter (HOSPITAL_COMMUNITY): Payer: Self-pay | Admitting: Surgery

## 2023-09-19 NOTE — Anesthesia Postprocedure Evaluation (Signed)
 Anesthesia Post Note  Patient: Caprice Red  Procedure(s) Performed: LIGATION, INTERNAL FISTULA TRACT, PARTIAL FISTULOTOMY ANORECTAL EXAM UNDER ANESTHESIA     Patient location during evaluation: PACU Anesthesia Type: General Level of consciousness: awake and alert Pain management: pain level controlled Vital Signs Assessment: post-procedure vital signs reviewed and stable Respiratory status: spontaneous breathing, nonlabored ventilation and respiratory function stable Cardiovascular status: blood pressure returned to baseline and stable Postop Assessment: no apparent nausea or vomiting Anesthetic complications: no   No notable events documented.  Last Vitals:  Vitals:   09/16/23 1330 09/16/23 1415  BP: (!) 165/82 (!) 159/93  Pulse:  85  Resp: 17   Temp:  36.8 C  SpO2: 100% 100%    Last Pain:  Vitals:   09/16/23 1415  TempSrc:   PainSc: 0-No pain                 Collene Schlichter

## 2023-09-21 NOTE — Telephone Encounter (Signed)
 Spoke with [facility], due to patient insurance patient did not want to be seen at [facility].  Contacted patient, she has ask chiropractic referral sent to a chiropractor in [place]. Will send referral  tomorrow.   Referral for chiropractic emailed to [facility] to [email or web address]. Phone: [phone], Fax: [phone]

## 2023-09-21 NOTE — Telephone Encounter (Signed)
 Lvm by hf 1st attempt

## 2023-09-21 NOTE — Telephone Encounter (Signed)
 Pt would like a call back from C[facility]

## 2023-09-22 NOTE — Telephone Encounter (Signed)
 Call to patient, no answer. Advice where referral was sent and advised to call back if further questions

## 2023-09-23 NOTE — Telephone Encounter (Signed)
 2nd attempt lvm by hf 09/23/23

## 2023-09-23 NOTE — Telephone Encounter (Signed)
 Spoke to pt and stated:  Refax referral for chiropractic to [facility]. Phone: [phone], Fax: [phone]   She voiced gratitude and understanding

## 2023-09-27 NOTE — Telephone Encounter (Signed)
 Call to patient, she is talking with Chiropractor office and will call me back.

## 2023-09-27 NOTE — Telephone Encounter (Signed)
 Pt has returned call to April, RN she will accept a call back when RN is available.

## 2023-09-27 NOTE — Telephone Encounter (Signed)
 Contact [facility] spoke with Debbie. They have the referral and will call her to schedule an appt after we hang up.

## 2023-09-27 NOTE — Telephone Encounter (Signed)
 Pt called in and stated that referral to [facility]. Phone: [phone], Fax: [phone]  was not received due to their fax machine being down last week. Pt is highly upset that she hasn't gotten in to her referral to get the help they need and is afraid that she will get paralyzed if she doesn't get assistance. She thinks no one is taking this serious and it shouldn't have taken 4 months for her to get in to the chiropractor. She stated that she was also scheduled for an appt 10/10/23 and wants to know why it was cancelled. She wants to be seen sooner than may 22 because she says she is highly dissatisfied that she hasn't gotten the compassionate quality care that she needs. I told her that I would escalate this to management team and see if they can come up with a resolution. I did see 10/04/23 has a couple of urgent referral spots that I could place her in Dr. Terrace Arabia and management team permitting. Upon checking on 10/10/23 appt it stated the pt cancelled it via jael armstrong (apparently she called in) but the only note I see from her says error and I see no further documentation in regards to that.   Ms. [name] please resend referral and make sure they get it this time if possible [facility]. Phone: [phone], Fax: [phone]

## 2023-09-27 NOTE — Telephone Encounter (Signed)
 Returned call back to patient no answer. Left message to call back.

## 2023-09-28 ENCOUNTER — Ambulatory Visit: Payer: 59 | Admitting: Pulmonary Disease

## 2023-09-29 DIAGNOSIS — M9912 Subluxation complex (vertebral) of thoracic region: Secondary | ICD-10-CM | POA: Diagnosis not present

## 2023-09-29 DIAGNOSIS — M9911 Subluxation complex (vertebral) of cervical region: Secondary | ICD-10-CM | POA: Diagnosis not present

## 2023-10-03 DIAGNOSIS — M9911 Subluxation complex (vertebral) of cervical region: Secondary | ICD-10-CM | POA: Diagnosis not present

## 2023-10-03 DIAGNOSIS — M9912 Subluxation complex (vertebral) of thoracic region: Secondary | ICD-10-CM | POA: Diagnosis not present

## 2023-10-04 ENCOUNTER — Telehealth: Payer: Self-pay | Admitting: *Deleted

## 2023-10-04 DIAGNOSIS — M9911 Subluxation complex (vertebral) of cervical region: Secondary | ICD-10-CM | POA: Diagnosis not present

## 2023-10-04 DIAGNOSIS — M9912 Subluxation complex (vertebral) of thoracic region: Secondary | ICD-10-CM | POA: Diagnosis not present

## 2023-10-04 NOTE — Telephone Encounter (Signed)
 Patient came into office today demanding to be seen as a patient. I explained to patient she was discharged from the practice due to cancellations, no shows, non compliant. Patient is being seen by rheumatology in [facility]. Patient was very rude to the front desk staff and myself.

## 2023-10-05 DIAGNOSIS — M9911 Subluxation complex (vertebral) of cervical region: Secondary | ICD-10-CM | POA: Diagnosis not present

## 2023-10-05 DIAGNOSIS — M9912 Subluxation complex (vertebral) of thoracic region: Secondary | ICD-10-CM | POA: Diagnosis not present

## 2023-10-06 DIAGNOSIS — L4059 Other psoriatic arthropathy: Secondary | ICD-10-CM | POA: Diagnosis not present

## 2023-10-06 DIAGNOSIS — Z79899 Other long term (current) drug therapy: Secondary | ICD-10-CM | POA: Diagnosis not present

## 2023-10-10 ENCOUNTER — Ambulatory Visit: Payer: 59 | Admitting: Neurology

## 2023-10-11 DIAGNOSIS — M9911 Subluxation complex (vertebral) of cervical region: Secondary | ICD-10-CM | POA: Diagnosis not present

## 2023-10-11 DIAGNOSIS — M9912 Subluxation complex (vertebral) of thoracic region: Secondary | ICD-10-CM | POA: Diagnosis not present

## 2023-10-12 DIAGNOSIS — M9911 Subluxation complex (vertebral) of cervical region: Secondary | ICD-10-CM | POA: Diagnosis not present

## 2023-10-12 DIAGNOSIS — M9912 Subluxation complex (vertebral) of thoracic region: Secondary | ICD-10-CM | POA: Diagnosis not present

## 2023-10-13 DIAGNOSIS — M9911 Subluxation complex (vertebral) of cervical region: Secondary | ICD-10-CM | POA: Diagnosis not present

## 2023-10-13 DIAGNOSIS — M9912 Subluxation complex (vertebral) of thoracic region: Secondary | ICD-10-CM | POA: Diagnosis not present

## 2023-10-18 DIAGNOSIS — M9912 Subluxation complex (vertebral) of thoracic region: Secondary | ICD-10-CM | POA: Diagnosis not present

## 2023-10-18 DIAGNOSIS — M9911 Subluxation complex (vertebral) of cervical region: Secondary | ICD-10-CM | POA: Diagnosis not present

## 2023-10-19 DIAGNOSIS — M9911 Subluxation complex (vertebral) of cervical region: Secondary | ICD-10-CM | POA: Diagnosis not present

## 2023-10-19 DIAGNOSIS — M9912 Subluxation complex (vertebral) of thoracic region: Secondary | ICD-10-CM | POA: Diagnosis not present

## 2023-10-25 ENCOUNTER — Encounter: Payer: Self-pay | Admitting: Physician Assistant

## 2023-10-25 DIAGNOSIS — I10 Essential (primary) hypertension: Secondary | ICD-10-CM | POA: Diagnosis not present

## 2023-10-25 DIAGNOSIS — E782 Mixed hyperlipidemia: Secondary | ICD-10-CM | POA: Diagnosis not present

## 2023-10-25 DIAGNOSIS — L4050 Arthropathic psoriasis, unspecified: Secondary | ICD-10-CM | POA: Diagnosis not present

## 2023-10-25 DIAGNOSIS — K219 Gastro-esophageal reflux disease without esophagitis: Secondary | ICD-10-CM | POA: Diagnosis not present

## 2023-10-25 DIAGNOSIS — G629 Polyneuropathy, unspecified: Secondary | ICD-10-CM | POA: Diagnosis not present

## 2023-10-25 DIAGNOSIS — M329 Systemic lupus erythematosus, unspecified: Secondary | ICD-10-CM | POA: Diagnosis not present

## 2023-10-25 DIAGNOSIS — G43909 Migraine, unspecified, not intractable, without status migrainosus: Secondary | ICD-10-CM | POA: Diagnosis not present

## 2023-10-25 DIAGNOSIS — J302 Other seasonal allergic rhinitis: Secondary | ICD-10-CM | POA: Diagnosis not present

## 2023-10-25 DIAGNOSIS — Z8709 Personal history of other diseases of the respiratory system: Secondary | ICD-10-CM | POA: Diagnosis not present

## 2023-10-25 DIAGNOSIS — E039 Hypothyroidism, unspecified: Secondary | ICD-10-CM | POA: Diagnosis not present

## 2023-10-26 ENCOUNTER — Other Ambulatory Visit: Payer: Self-pay | Admitting: Physician Assistant

## 2023-10-26 DIAGNOSIS — N6310 Unspecified lump in the right breast, unspecified quadrant: Secondary | ICD-10-CM

## 2023-11-07 ENCOUNTER — Telehealth: Payer: Self-pay | Admitting: Adult Health

## 2023-11-07 NOTE — Telephone Encounter (Signed)
 Appointment details confirmed

## 2023-11-10 ENCOUNTER — Ambulatory Visit: Admitting: Neurology

## 2023-11-10 ENCOUNTER — Encounter: Payer: Self-pay | Admitting: Neurology

## 2023-11-11 ENCOUNTER — Encounter

## 2023-11-11 ENCOUNTER — Other Ambulatory Visit

## 2023-11-15 ENCOUNTER — Telehealth: Payer: Self-pay | Admitting: Neurology

## 2023-11-15 ENCOUNTER — Encounter: Payer: Self-pay | Admitting: Neurology

## 2023-11-15 ENCOUNTER — Ambulatory Visit: Admitting: Neurology

## 2023-11-15 NOTE — Telephone Encounter (Signed)
 Call to patient. Advised was on the waitlist. Patient appreciative  of call

## 2023-11-15 NOTE — Telephone Encounter (Signed)
 Pt called stating that she forgot about appt today and want to get an earlier appt . Pt stated she had waited for 5 months for this appt.Scheduled Pt for July . Pt has been added to wait list ,However she would like to be seen sooner .

## 2023-11-22 DIAGNOSIS — E039 Hypothyroidism, unspecified: Secondary | ICD-10-CM | POA: Diagnosis not present

## 2023-11-22 DIAGNOSIS — G629 Polyneuropathy, unspecified: Secondary | ICD-10-CM | POA: Diagnosis not present

## 2023-11-22 DIAGNOSIS — R7303 Prediabetes: Secondary | ICD-10-CM | POA: Diagnosis not present

## 2023-11-22 DIAGNOSIS — E782 Mixed hyperlipidemia: Secondary | ICD-10-CM | POA: Diagnosis not present

## 2023-11-22 DIAGNOSIS — Z8709 Personal history of other diseases of the respiratory system: Secondary | ICD-10-CM | POA: Diagnosis not present

## 2023-11-22 DIAGNOSIS — M329 Systemic lupus erythematosus, unspecified: Secondary | ICD-10-CM | POA: Diagnosis not present

## 2023-11-22 DIAGNOSIS — Z0000 Encounter for general adult medical examination without abnormal findings: Secondary | ICD-10-CM | POA: Diagnosis not present

## 2023-11-22 DIAGNOSIS — R0789 Other chest pain: Secondary | ICD-10-CM | POA: Diagnosis not present

## 2023-11-22 DIAGNOSIS — L4050 Arthropathic psoriasis, unspecified: Secondary | ICD-10-CM | POA: Diagnosis not present

## 2023-11-22 DIAGNOSIS — I10 Essential (primary) hypertension: Secondary | ICD-10-CM | POA: Diagnosis not present

## 2023-11-22 DIAGNOSIS — G43909 Migraine, unspecified, not intractable, without status migrainosus: Secondary | ICD-10-CM | POA: Diagnosis not present

## 2023-11-22 DIAGNOSIS — K219 Gastro-esophageal reflux disease without esophagitis: Secondary | ICD-10-CM | POA: Diagnosis not present

## 2023-11-28 ENCOUNTER — Ambulatory Visit: Payer: Self-pay | Admitting: Physician Assistant

## 2023-11-28 ENCOUNTER — Ambulatory Visit: Admitting: Internal Medicine

## 2023-11-28 ENCOUNTER — Encounter: Payer: Self-pay | Admitting: Internal Medicine

## 2023-11-28 NOTE — Telephone Encounter (Signed)
 ATC x1 LVM for patient to call our office back regarding prior message. LOV was 05/06/2014 with Dr.Wert.Patient will need to schedule as a new patient or she can can see her PCP.

## 2023-11-28 NOTE — Telephone Encounter (Signed)
 FYI Only or Action Required?: Action required by provider  Patient is followed in Pulmonology for N/A, last seen on N/A. Called Nurse Triage reporting Shortness of Breath. Symptoms began several weeks ago. Interventions attempted: Maintenance inhaler. Symptoms are: stable.  Triage Disposition: See HCP Within 4 Hours (Or PCP Triage)  Patient/caregiver understands and will follow disposition?: No. Patient will not go to ED and requests a call back from clinic as next available appointment is not until July.                      Copied from CRM [id number]. Topic: Clinical - Red Word Triage >> Nov 28, 2023  9:04 AM [name] wrote: Red Word that prompted transfer to Nurse Triage: Pt has been having shortness of breath and difficulty breathing for about 3 weeks. Pt had an appt for 6/9 however lack of transportation and wants to reschedule. Reason for Disposition  [1] MILD difficulty breathing (e.g., minimal/no SOB at rest, SOB with walking, pulse <100) AND [2] NEW-onset or WORSE than normal  Answer Assessment - Initial Assessment Questions Patient cancelled appointment for today as no transportation  SOB, intermittent, for about 3 weeks, when up and moving Has had this happen before, previously was seeing Dr. Waymond Hailey Coughing and wheezing ("I have asthma," uses two inhalers, using more frequently) Dizziness Chest pain intermittent a couple of days a week, "mainly in back" (varies in pain level, not currently having it, patient states she has seen her PCP for this)  Protocols used: Breathing Difficulty-A-AH

## 2023-12-01 DIAGNOSIS — S4361XA Sprain of right sternoclavicular joint, initial encounter: Secondary | ICD-10-CM | POA: Diagnosis not present

## 2023-12-01 DIAGNOSIS — M25511 Pain in right shoulder: Secondary | ICD-10-CM | POA: Diagnosis not present

## 2023-12-01 DIAGNOSIS — M25519 Pain in unspecified shoulder: Secondary | ICD-10-CM | POA: Diagnosis not present

## 2023-12-02 ENCOUNTER — Other Ambulatory Visit: Payer: Self-pay | Admitting: Orthopedic Surgery

## 2023-12-02 DIAGNOSIS — M25519 Pain in unspecified shoulder: Secondary | ICD-10-CM

## 2023-12-05 ENCOUNTER — Ambulatory Visit
Admission: RE | Admit: 2023-12-05 | Discharge: 2023-12-05 | Disposition: A | Discharge status: Home / Self Care | Source: Ambulatory Visit | Attending: Orthopedic Surgery

## 2023-12-05 DIAGNOSIS — I517 Cardiomegaly: Secondary | ICD-10-CM | POA: Diagnosis not present

## 2023-12-05 DIAGNOSIS — I700 Atherosclerosis of aorta: Secondary | ICD-10-CM | POA: Diagnosis not present

## 2023-12-05 DIAGNOSIS — M25519 Pain in unspecified shoulder: Secondary | ICD-10-CM

## 2023-12-05 DIAGNOSIS — N200 Calculus of kidney: Secondary | ICD-10-CM | POA: Diagnosis not present

## 2023-12-05 DIAGNOSIS — M419 Scoliosis, unspecified: Secondary | ICD-10-CM | POA: Diagnosis not present

## 2023-12-05 NOTE — Telephone Encounter (Signed)
 I called and spoke to pt. Pt states she did see her PCP and she had a CT scan done. PCP referred her to see a Development worker, international aid and a Chief Strategy Officer. Pt states that she was not sure which doctor to see regarding her symptoms but she will wait to see what her Cardiologists or the surgeon says first and if she needs to see our office, she will have them place a referral. NFN

## 2023-12-09 ENCOUNTER — Encounter

## 2023-12-09 ENCOUNTER — Other Ambulatory Visit

## 2023-12-13 ENCOUNTER — Encounter: Payer: Self-pay | Admitting: Cardiology

## 2023-12-15 DIAGNOSIS — M19011 Primary osteoarthritis, right shoulder: Secondary | ICD-10-CM | POA: Diagnosis not present

## 2023-12-26 ENCOUNTER — Telehealth: Payer: Self-pay | Admitting: Neurology

## 2023-12-26 ENCOUNTER — Encounter: Payer: Self-pay | Admitting: Neurology

## 2023-12-26 ENCOUNTER — Ambulatory Visit: Admitting: Neurology

## 2023-12-26 NOTE — Telephone Encounter (Signed)
  Pt called to reschedule appt today at 9:45am due to transportation. appt Rescheduled

## 2023-12-26 NOTE — Telephone Encounter (Signed)
 Hello all. Just an FYI, pt had her 3rd No show for appts today. TY

## 2023-12-26 NOTE — Telephone Encounter (Signed)
 noted

## 2023-12-27 ENCOUNTER — Encounter: Payer: Self-pay | Admitting: Neurology

## 2024-01-31 ENCOUNTER — Ambulatory Visit: Admitting: Cardiology

## 2024-02-14 ENCOUNTER — Ambulatory Visit

## 2024-03-08 ENCOUNTER — Ambulatory Visit: Admitting: Neurology

## 2024-03-11 NOTE — Progress Notes (Deleted)
 Cardiology Office Note:    Date:  03/11/2024   ID:  [name], DOB [birth year], MRN [id number]  PCP:  Rosalea Rosina SAILOR, PA  Cardiologist:  Redell Leiter, MD   Referring MD: Rosalea Rosina SAILOR, [place]  ASSESSMENT:    1. Chest pain of uncertain etiology   2. SLE (systemic lupus erythematosus related syndrome) (HCC)   3. Psoriatic arthritis (HCC)   4. Mixed hyperlipidemia    PLAN:    In order of problems listed above:  ***  Next appointment   Medication Adjustments/Labs and Tests Ordered: Current medicines are reviewed at length with the patient today.  Concerns regarding medicines are outlined above.  No orders of the defined types were placed in this encounter.  No orders of the defined types were placed in this encounter.    No chief complaint on file. ***  History of Present Illness:    [name] is a 58 y.o. female with a history of previous chest pain evaluation 2020 with normal cardiac CTA score 0 no CAD, type 2 diabetes psoriatic arthritis and palpitation with a normal ambulatory event monitor who is being seen today for the evaluation of chest pain at the request of Rosalea Rosina SAILOR, [place].  She was recently seen with her PCP for sharp chest pain which apparently is a recurrent problem and a diagnosis of SLE not present when she was seen in 2020.  She was seen with her rheumatologist April this year with diagnosis of lupus and psoriatic arthritis.  Recent labs in June showed a cholesterol 247 LDL 162 non-HDL cholesterol 198.  GFR 65 cc/min hemoglobin 12.2 sedimentation rate was quite elevated at 48 C-reactive protein also elevated at 16.9  CT of the chest performed in June without contrast showed aortic atherosclerosis and cardiomegaly. Past Medical History:  Diagnosis Date  . Cancer (HCC)   . DDD (degenerative disc disease), lumbar   . Depression   . Diverticulosis of colon   . Full dentures   . GAD (generalized anxiety disorder)   . GERD  (gastroesophageal reflux disease)   . Hiatal hernia   . History of cancer chemotherapy 2006   completed chemo 07/ 2006  for nasopharyngeal cancer  . History of head and neck radiation 2006   completed radiation 07/ 2006 for nasopharyngeal cancer  by dr patrcia  . History of kidney stones 2013  . 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 mohamed/ radiation oncology-- dr patrcia;   dx 04/ 2006;   completed chemoradiation 07/ 2006  . History of seizures    07-13-2023  pt stated as child due to head injury last one in elementary school , none since and no issue since  . Hypertension   . Hypothyroidism    followed by pcp  . IDA (iron deficiency anemia)   . Intermittent palpitations 2020   evaulated by cardiology--- dr leiter and released by [facility] note in epic 06-08-2019;  normal echo 03-07-2019;  CCT/ coronary 10/ 2020 calcium score zero;  event monitor 05-01-2019 no sign arrhythmia , rare PAC/ PVC  . Leiomyoma of uterus, unspecified   . Lupus (systemic lupus erythematosus) (HCC)    followed by rheuomatology  . Migraines    neurology-- dr rush;   takes Topamax  and Maxalt  . Moderate persistent asthma    followed by pcp  and as needed [facility];   (07-13-2023  pt stated last used rescue inhaler 2 weeks ago and last used nebulizer  1.5 wks ago)  . Neuropathy due to chemotherapeutic drug (HCC)   . Pneumonia   . Psoriasis   . Psoriatic arthritis of multiple joints [facility])    rheumatologist--- dr kiki jing ([facility] [place] [facility])  - ankles, knees, hands, neck  . Wears glasses     Past Surgical History:  Procedure Laterality Date  . CESAREAN SECTION  1992  . COLONOSCOPY WITH ESOPHAGOGASTRODUODENOSCOPY (EGD)  02/04/2021   dr pyrtle  . DEEP NECK LYMPH NODE BIOPSY / EXCISION  09/30/2004   @[facility]  by dr lois finer;    Excision biopsy, right upper neck and lymph node's  (right retropharyngeal biopsy/ right neck lymph node oropharynx biopsy)  . DENTAL  RESTORATION/EXTRACTION WITH X-RAY  10/21/2004   @[facility]  by dr r. kulinski;   Extraction teeth x5;  all four quadrants alveoloplasty scaling & root planing for pre-chemoradiation protocal for chronic perodonitis  . DILATION AND EVACUATION  04/22/2004   @ [facility] by dr rogelio  (fetal demise 18 wks)  . ESOPHAGOGASTRODUODENOSCOPY (EGD) WITH ESOPHAGEAL DILATION  12/21/2022   dr pyrtle  . INCISION AND DRAINAGE PERIRECTAL ABSCESS  07/20/2023   Procedure: IRRIGATION AND DEBRIDEMENT PERIRECTAL ABSCESS;  Surgeon: Teresa Lonni HERO, MD;  Location: [facility];  Service: General;;  . LIGATION OF INTERNAL FISTULA TRACT N/A 07/20/2023   Procedure: SURGICAL TREATMENT OF TRANSSPHINCTERIC ANAL FISTULA LIGATION OF INTERNAL FISTULA TRACT AND PLACEMENT OF SETON;  Surgeon: Teresa Lonni HERO, MD;  Location: [facility];  Service: General;  Laterality: N/A;  . LIGATION OF INTERNAL FISTULA TRACT N/A 09/16/2023   Procedure: LIGATION, INTERNAL FISTULA TRACT, PARTIAL FISTULOTOMY;  Surgeon: Teresa Lonni HERO, MD;  Location: [facility];  Service: General;  Laterality: N/A;  . MULTIPLE EXTRACTIONS WITH ALVEOLOPLASTY N/A 03/05/2013   Procedure: MULTIPLE EXTRACION WITH ALVEOLOPLASTY, extraction of decayed teeth numbers 3,4,5,18,19,22,23,24,25, extraction of retained root tips teeth numbers 2,20,21,26, bilateral mandibular alveoloplasty and upper right maxillary alveoloplasty;  Surgeon: Lonni LITTIE Sax, DDS;  Location: [facility] OR;  Service: Oral Surgery;  Laterality: N/A;  . ORIF ANKLE FRACTURE Right 2000   per pt has retained hardware  . PLACEMENT OF SETON N/A 04/11/2023   Procedure: PLACEMENT OF DRAINING  SETON;  Surgeon: Teresa Lonni HERO, MD;  Location: [facility];  Service: General;  Laterality: N/A;  . RECTAL EXAM UNDER ANESTHESIA N/A 07/20/2023   Procedure: ANORECTAL EXAM UNDER ANESTHESIA;  Surgeon: Teresa Lonni HERO, MD;  Location: [facility];  Service: General;  Laterality: N/A;  . RECTAL EXAM UNDER ANESTHESIA N/A 09/16/2023    Procedure: ANORECTAL EXAM UNDER ANESTHESIA;  Surgeon: Teresa Lonni HERO, MD;  Location: [facility];  Service: General;  Laterality: N/A;  . TOOTH EXTRACTION Bilateral 03/05/2013   Procedure: EXTRACTION MOLARS;  Surgeon: Lonni LITTIE Sax, DDS;  Location: [facility] OR;  Service: Oral Surgery;  Laterality: Bilateral;    Current Medications: No outpatient medications have been marked as taking for the 03/13/24 encounter (Appointment) with Monetta Redell PARAS, MD.     Allergies:   Morphine , Amoxicillin, Latex, Oxycodone , Cephalexin, Ciprofloxacin hcl, Penicillins, Plaquenil [hydroxychloroquine sulfate], Sulfamethoxazole-trimethoprim, and Tetracyclines & related   Social History   Socioeconomic History  . Marital status: Married    Spouse name: Not on file  . Number of children: Not on file  . Years of education: Not on file  . Highest education level: Not on file  Occupational History  . Not on file  Tobacco Use  . Smoking status: Former    Current packs/day: 0.00    Average  packs/day: 0.3 packs/day for 8.0 years (2.0 ttl pk-yrs)    Types: Cigarettes    Start date: 02/28/1996    Quit date: 02/28/2004    Years since quitting: 20.0  . Smokeless tobacco: Never  Vaping Use  . Vaping status: Never Used  Substance and Sexual Activity  . Alcohol  use: Never  . Drug use: Not Currently  . Sexual activity: Yes    Partners: Male    Birth control/protection: Post-menopausal  Other Topics Concern  . Not on file  Social History Narrative   Right/left handed    Wears glasses    No caffeine    Social Drivers of Corporate investment banker Strain: Not on file  Food Insecurity: Not on file  Transportation Needs: Not on file  Physical Activity: Not on file  Stress: Not on file  Social Connections: Unknown (11/01/2021)   [facility]   Social Network   . Social Network: Not on file     Family History: The patient's ***family history includes Breast cancer (age of onset: 57) in her  maternal grandmother; Diabetes in her mother; Hypertension in her father and mother; Lupus in her brother. There is no history of Colon cancer, Esophageal cancer, Rectal cancer, Stomach cancer, or Colon polyps.  ROS:   ROS Please see the history of present illness.    *** All other systems reviewed and are negative.  EKGs/Labs/Other Studies Reviewed:    The following studies were reviewed today: ***  Cardiac Studies & Procedures   ______________________________________________________________________________________________     ECHOCARDIOGRAM  ECHOCARDIOGRAM COMPLETE 03/07/2019  Narrative ECHOCARDIOGRAM REPORT    Patient Name:   [name] Date of Exam: 03/07/2019 Medical Rec #:  [id number]             Height:       66.0 in Accession #:    [id number]            Weight:       150.1 lb Date of Birth:  1966/01/16            BSA:          1.77 m Patient Age:    52 years              BP:           106/74 mmHg Patient Gender: F                     HR:           72 bpm. Exam Location:  [address]   Procedure: 2D Echo, Cardiac Doppler and Color Doppler  Indications:    R07.9* Chest pain, unspecified; R06.02 SOB  History:        Patient has prior history of Echocardiogram examinations, most recent 06/20/2013. Risk Factors: Hypertension and Diabetes.  Sonographer:    Jon Hacker RCS Referring Phys: [phone] Falicia Lizotte J Tauren Delbuono  IMPRESSIONS   1. The left ventricle has normal systolic function, with an ejection fraction of 55-60%. The cavity size was normal. There is moderately increased left ventricular wall thickness. Left ventricular diastolic Doppler parameters are consistent with impaired relaxation. 2. The right ventricle has normal systolic function. The cavity was normal. There is no increase in right ventricular wall thickness. 3. The aortic valve is tricuspid. 4. The aorta is normal unless otherwise noted.  FINDINGS Left Ventricle: The left ventricle has  normal systolic function, with an ejection fraction of 55-60%. The cavity  size was normal. There is moderately increased left ventricular wall thickness. Left ventricular diastolic Doppler parameters are consistent with impaired relaxation.  Right Ventricle: The right ventricle has normal systolic function. The cavity was normal. There is no increase in right ventricular wall thickness.  Left Atrium: Left atrial size was normal in size.  Right Atrium: Right atrial size was normal in size. Right atrial pressure is estimated at 10 mmHg.  Interatrial Septum: No atrial level shunt detected by color flow Doppler.  Pericardium: There is no evidence of pericardial effusion.  Mitral Valve: The mitral valve is normal in structure. Mitral valve regurgitation is trivial by color flow Doppler.  Tricuspid Valve: The tricuspid valve is normal in structure. Tricuspid valve regurgitation is trivial by color flow Doppler.  Aortic Valve: The aortic valve is tricuspid Aortic valve regurgitation was not visualized by color flow Doppler. There is no evidence of aortic valve stenosis.  Pulmonic Valve: The pulmonic valve was normal in structure. Pulmonic valve regurgitation is trivial by color flow Doppler.  Aorta: The aorta is normal unless otherwise noted.  Venous: The inferior vena cava is normal in size with greater than 50% respiratory variability.   +--------------+--------++ LEFT VENTRICLE         +----------------+---------++ +--------------+--------++ Diastology                PLAX 2D                +----------------+---------++ +--------------+--------++ LV e' lateral:  7.51 cm/s LVIDd:        2.60 cm  +----------------+---------++ +--------------+--------++ LV E/e' lateral:6.4       LVIDs:        1.70 cm  +----------------+---------++ +--------------+--------++ LV e' medial:   6.20 cm/s LV PW:        1.60 cm   +----------------+---------++ +--------------+--------++ LV E/e' medial: 7.7       LV IVS:       1.60 cm  +----------------+---------++ +--------------+--------++ LVOT diam:    2.00 cm  +--------------+--------++ LV SV:        16 ml    +--------------+--------++ LV SV Index:  9.07     +--------------+--------++ LVOT Area:    3.14 cm +--------------+--------++                        +--------------+--------++  +---------------+---------++ RIGHT VENTRICLE          +---------------+---------++ RV Basal diam: 2.04 cm   +---------------+---------++ RV S prime:    7.72 cm/s +---------------+---------++ TAPSE (M-mode):1.5 cm    +---------------+---------++  +---------------+-------++-----------++ LEFT ATRIUM           Index       +---------------+-------++-----------++ LA diam:       2.60 cm1.47 cm/m  +---------------+-------++-----------++ LA Vol (A2C):  35.3 ml19.94 ml/m +---------------+-------++-----------++ LA Vol (A4C):  23.4 ml13.22 ml/m +---------------+-------++-----------++ LA Biplane Vol:30.6 ml17.29 ml/m +---------------+-------++-----------++ +------------+--------++----------++ RIGHT ATRIUM        Index      +------------+--------++----------++ RA Area:    9.01 cm           +------------+--------++----------++ RA Volume:  15.50 ml8.76 ml/m +------------+--------++----------++ +------------+-----------++ AORTIC VALVE            +------------+-----------++ LVOT Vmax:  96.60 cm/s  +------------+-----------++ LVOT Vmean: 55.400 cm/s +------------+-----------++ LVOT VTI:   0.170 m     +------------+-----------++  +-------------+-------++ AORTA                +-------------+-------++ Ao Root diam:3.00  cm +-------------+-------++  +--------------+--------++   +---------------+-----------++ MITRAL VALVE             TRICUSPID VALVE             +--------------+--------++   +---------------+-----------++ MV Area (PHT):           TR Peak grad:  13.8 mmHg   +--------------+--------++   +---------------+-----------++ MV PHT:                  TR Vmax:       186.00 cm/s +--------------+--------++   +---------------+-----------++ MV Decel Time:257 msec +--------------+--------++   +--------------+-------+ +--------------+----------++ SHUNTS                MV E velocity:48.00 cm/s +--------------+-------+ +--------------+----------++ Systemic VTI: 0.17 m  MV A velocity:72.80 cm/s +--------------+-------+ +--------------+----------++ Systemic Diam:2.00 cm MV E/A ratio: 0.66       +--------------+-------+ +--------------+----------++   Oneil Parchment MD Electronically signed by Oneil Parchment MD Signature Date/Time: 03/07/2019/11:19:55 AM    Final    MONITORS  LONG TERM MONITOR (3-14 DAYS) 05/01/2019  Narrative A ZIO monitor was performed beginning 04/13/2019 for 7 days and 1 hour to evaluate palpitation.  The heart rhythm throughout recording was sinus with minimum average and maximum heart rates of 59, 85 and 152 bpm.  Rest morphology is normal.  There were no pauses of 3 seconds or greater and no episodes of AV nodal or sinus node block.  Ventricular ectopy was rare with isolated PVCs  Supraventricular ectopy was rare with isolated APCs.  There was one 4 beat run of atrial premature contractions that was asymptomatic.  There were no episodes of atrial fibrillation or flutter.  There were 8 triggered events all sinus rhythm.  There were 4 diary events of palpitation associated with sinus rhythm.   Conclusion, normal 7-day ZIO monitor without significant arrhythmia, note the triggered and diary events did not show arrhythmia.   CT SCANS  CT CORONARY MORPH W/CTA COR W/SCORE 03/23/2019  Addendum 03/25/2019 11:20 AM ADDENDUM REPORT: 03/25/2019 11:18  HISTORY: 58 yo female  with chest pain  EXAM: Cardiac/Coronary CTA  TECHNIQUE: The patient was scanned on a [employer].  PROTOCOL: A 120 kV prospective scan was triggered in the descending thoracic aorta at 111 HU's. Axial non-contrast 3 mm slices were carried out through the heart. The data set was analyzed on a dedicated work station and scored using the Agatson method. Gantry rotation speed was 250 msecs and collimation was .6 mm. Beta blockade and 0.8 mg of sl NTG was given. The 3D data set was reconstructed in 5% intervals of the 67-82 % of the R-R cycle. Diastolic phases were analyzed on a dedicated work station using MPR, MIP and VRT modes. The patient received 80mL OMNIPAQUE  IOHEXOL  350 MG/ML SOLN of contrast.  FINDINGS: Quality: Excellent  Coronary calcium score: The patient's coronary artery calcium score is 0, which places the patient in the 0 percentile.  Coronary arteries: Normal coronary origins.  Right dominance.  Right Coronary Artery: Dominant. Smaller vessel. Motion artifact in the mid vessel obscures the lumen, but otherwise, no significant stenosis.  Left Main Coronary Artery: Normal left main. Bifurcates into the LAD and LCx arteries as usual.  Left Anterior Descending Coronary Artery: Larger tortuous vessel that wraps around the apex. Gives off a single, larger diagonal branch. No significant stenosis.  Left Circumflex Artery: Large vessel - gives off a single OM branch. No significant stenosis.  Aorta: Normal size, 30 mm at  the mid ascending aorta (level of the PA bifurcation) measured double oblique. No calcifications. No dissection.  Aortic Valve: Trileaflet. No calcifications.  Other findings:  Normal pulmonary vein drainage into the left atrium.  Normal left atrial appendage without a thrombus.  Borderline dilated pulmonary artery at 27 mm.  IMPRESSION: 1. No evidence of CAD, CADRADS = 0.  2. Coronary calcium score of 0. This was 0 percentile  for age and sex matched control.  3. Normal coronary origin with right dominance.  4.  Borderline dilated pulmonary artery at 27 mm.   Electronically Signed By: Vinie JAYSON Maxcy M.D. On: 03/25/2019 11:18  Narrative EXAM: OVER-READ INTERPRETATION  CT CHEST  The following report is an over-read performed by radiologist Dr. Franky Crease of [facility] on 03/23/2019. This over-read does not include interpretation of cardiac or coronary anatomy or pathology. The coronary CTA interpretation by the cardiologist is attached.  COMPARISON:  03/20/2019  FINDINGS: Vascular: Heart is upper limits normal in size. Aorta is normal caliber.  Mediastinum/Nodes: No adenopathy in the lower mediastinum or hila.  Lungs/Pleura: No confluent opacities or effusions.  Upper Abdomen: Imaging into the upper abdomen shows no acute findings.  Musculoskeletal: Chest wall soft tissues are unremarkable. No acute bony abnormality.  IMPRESSION: No acute or significant extracardiac abnormality.  Electronically Signed: By: Franky Crease M.D. On: 03/23/2019 13:15     ______________________________________________________________________________________________      EKG:  EKG is *** ordered today.  The ekg ordered today is personally reviewed and demonstrates ***  Recent Labs: 09/16/2023: BUN 17; Creatinine, Ser 0.91; Hemoglobin 11.7; Platelets 278; Potassium 3.5; Sodium 140  Recent Lipid Panel    Component Value Date/Time   CHOL 176 11/19/2020 0000   TRIG 154 (H) 11/19/2020 0000   HDL 44 (L) 11/19/2020 0000   CHOLHDL 4.0 11/19/2020 0000   LDLCALC 105 (H) 11/19/2020 0000    Physical Exam:    VS:  LMP 06/25/2016     Wt Readings from Last 3 Encounters:  09/16/23 164 lb 14.5 oz (74.8 kg)  07/20/23 163 lb (73.9 kg)  07/18/23 163 lb (73.9 kg)     GEN: *** Well nourished, well developed in no acute distress HEENT: Normal NECK: No JVD; No carotid bruits LYMPHATICS: No  lymphadenopathy CARDIAC: ***RRR, no murmurs, rubs, gallops RESPIRATORY:  Clear to auscultation without rales, wheezing or rhonchi  ABDOMEN: Soft, non-tender, non-distended MUSCULOSKELETAL:  No edema; No deformity  SKIN: Warm and dry NEUROLOGIC:  Alert and oriented x 3 PSYCHIATRIC:  Normal affect     Signed, Redell Leiter, MD  03/11/2024 10:59 AM    [facility]

## 2024-03-12 ENCOUNTER — Ambulatory Visit

## 2024-03-12 DIAGNOSIS — J189 Pneumonia, unspecified organism: Secondary | ICD-10-CM | POA: Insufficient documentation

## 2024-03-12 DIAGNOSIS — G43909 Migraine, unspecified, not intractable, without status migrainosus: Secondary | ICD-10-CM | POA: Insufficient documentation

## 2024-03-12 DIAGNOSIS — G620 Drug-induced polyneuropathy: Secondary | ICD-10-CM | POA: Insufficient documentation

## 2024-03-12 DIAGNOSIS — F411 Generalized anxiety disorder: Secondary | ICD-10-CM | POA: Insufficient documentation

## 2024-03-12 DIAGNOSIS — E559 Vitamin D deficiency, unspecified: Secondary | ICD-10-CM | POA: Insufficient documentation

## 2024-03-12 DIAGNOSIS — Z13220 Encounter for screening for lipoid disorders: Secondary | ICD-10-CM | POA: Insufficient documentation

## 2024-03-12 DIAGNOSIS — R269 Unspecified abnormalities of gait and mobility: Secondary | ICD-10-CM | POA: Insufficient documentation

## 2024-03-12 DIAGNOSIS — Z87898 Personal history of other specified conditions: Secondary | ICD-10-CM | POA: Insufficient documentation

## 2024-03-12 DIAGNOSIS — D259 Leiomyoma of uterus, unspecified: Secondary | ICD-10-CM | POA: Insufficient documentation

## 2024-03-12 DIAGNOSIS — D509 Iron deficiency anemia, unspecified: Secondary | ICD-10-CM | POA: Insufficient documentation

## 2024-03-12 DIAGNOSIS — K5730 Diverticulosis of large intestine without perforation or abscess without bleeding: Secondary | ICD-10-CM | POA: Insufficient documentation

## 2024-03-12 DIAGNOSIS — Z973 Presence of spectacles and contact lenses: Secondary | ICD-10-CM | POA: Insufficient documentation

## 2024-03-12 DIAGNOSIS — K219 Gastro-esophageal reflux disease without esophagitis: Secondary | ICD-10-CM | POA: Insufficient documentation

## 2024-03-12 DIAGNOSIS — K449 Diaphragmatic hernia without obstruction or gangrene: Secondary | ICD-10-CM | POA: Insufficient documentation

## 2024-03-12 DIAGNOSIS — E782 Mixed hyperlipidemia: Secondary | ICD-10-CM | POA: Insufficient documentation

## 2024-03-12 DIAGNOSIS — R2689 Other abnormalities of gait and mobility: Secondary | ICD-10-CM | POA: Insufficient documentation

## 2024-03-12 DIAGNOSIS — J4540 Moderate persistent asthma, uncomplicated: Secondary | ICD-10-CM | POA: Insufficient documentation

## 2024-03-12 DIAGNOSIS — J302 Other seasonal allergic rhinitis: Secondary | ICD-10-CM | POA: Insufficient documentation

## 2024-03-12 DIAGNOSIS — M329 Systemic lupus erythematosus, unspecified: Secondary | ICD-10-CM | POA: Insufficient documentation

## 2024-03-12 DIAGNOSIS — F32A Depression, unspecified: Secondary | ICD-10-CM | POA: Insufficient documentation

## 2024-03-12 DIAGNOSIS — I10 Essential (primary) hypertension: Secondary | ICD-10-CM | POA: Insufficient documentation

## 2024-03-12 DIAGNOSIS — M51369 Other intervertebral disc degeneration, lumbar region without mention of lumbar back pain or lower extremity pain: Secondary | ICD-10-CM | POA: Insufficient documentation

## 2024-03-12 DIAGNOSIS — C801 Malignant (primary) neoplasm, unspecified: Secondary | ICD-10-CM | POA: Insufficient documentation

## 2024-03-13 ENCOUNTER — Ambulatory Visit: Admitting: Cardiology

## 2024-03-13 DIAGNOSIS — M329 Systemic lupus erythematosus, unspecified: Secondary | ICD-10-CM

## 2024-03-13 DIAGNOSIS — R079 Chest pain, unspecified: Secondary | ICD-10-CM

## 2024-03-13 DIAGNOSIS — E782 Mixed hyperlipidemia: Secondary | ICD-10-CM

## 2024-03-13 DIAGNOSIS — L4050 Arthropathic psoriasis, unspecified: Secondary | ICD-10-CM

## 2024-03-14 ENCOUNTER — Ambulatory Visit: Admitting: Cardiovascular Disease

## 2024-03-20 ENCOUNTER — Ambulatory Visit: Attending: Cardiovascular Disease | Admitting: Cardiovascular Disease

## 2024-03-21 ENCOUNTER — Encounter: Payer: Self-pay | Admitting: Cardiovascular Disease

## 2024-05-10 ENCOUNTER — Other Ambulatory Visit

## 2024-05-10 ENCOUNTER — Inpatient Hospital Stay: Admission: RE | Admit: 2024-05-10 | Source: Ambulatory Visit

## 2024-06-12 ENCOUNTER — Encounter

## 2024-06-12 ENCOUNTER — Ambulatory Visit: Admitting: Internal Medicine

## 2024-06-12 ENCOUNTER — Inpatient Hospital Stay: Admission: RE | Admit: 2024-06-12

## 2024-06-28 ENCOUNTER — Encounter: Payer: Self-pay | Admitting: Internal Medicine

## 2024-06-28 ENCOUNTER — Other Ambulatory Visit

## 2024-06-28 ENCOUNTER — Ambulatory Visit: Admitting: Internal Medicine

## 2024-06-28 ENCOUNTER — Encounter

## 2024-06-28 NOTE — Progress Notes (Unsigned)
 "  [name], female    DOB: March 09, 1966   MRN: [id number]   Brief patient profile:  59 yo ***   *** referred to pulmonary clinic 06/28/2024 by *** for ***      Pt not previously seen by PCCM service.    History of Present Illness  06/28/2024  Pulmonary/ 1st office eval/[facility]  No chief complaint on file.    Dyspnea:  *** Cough: *** Sleep: *** SABA use: *** 02 use:*** LDSCT:***  No obvious day to day or daytime pattern/variability or assoc excess/ purulent sputum or mucus plugs or hemoptysis or cp or chest tightness, subjective wheeze or overt sinus or hb symptoms.    Also denies any obvious fluctuation of symptoms with weather or environmental changes or other aggravating or alleviating factors except as outlined above   No unusual exposure hx or h/o childhood pna/ asthma or knowledge of premature birth.  Current Allergies, Complete Past Medical History, Past Surgical History, Family History, and Social History were reviewed in [employer] record.  ROS  The following are not active complaints unless bolded Hoarseness, sore throat, dysphagia, dental problems, itching, sneezing,  nasal congestion or discharge of excess mucus or purulent secretions, ear ache,   fever, chills, sweats, unintended wt loss or wt gain, classically pleuritic or exertional cp,  orthopnea pnd or arm/hand swelling  or leg swelling, presyncope, palpitations, abdominal pain, anorexia, nausea, vomiting, diarrhea  or change in bowel habits or change in bladder habits, change in stools or change in urine, dysuria, hematuria,  rash, arthralgias, visual complaints, headache, numbness, weakness or ataxia or problems with walking or coordination,  change in mood or  memory.             Outpatient Medications Prior to Visit  Medication Sig Dispense Refill   albuterol  (PROVENTIL  HFA;VENTOLIN  HFA) 108 (90 BASE) MCG/ACT inhaler Inhale 2 puffs into the lungs every 6 (six) hours as needed for  wheezing or shortness of breath.      albuterol  (PROVENTIL ) (2.5 MG/3ML) 0.083% nebulizer solution Take 2.5 mg by nebulization every 6 (six) hours as needed for wheezing or shortness of breath. Reported on 10/27/2015     Ascorbic Acid (VITAMIN C) 1000 MG tablet Take 1,000 mg by mouth 2 (two) times daily.     BIOTIN PO Take 400 mcg by mouth 2 (two) times daily.     Calcium Carbonate (CALCIUM 600 PO) Take 600 mg by mouth 2 (two) times daily.     cetirizine (ZYRTEC) 10 MG tablet Take 10 mg by mouth at bedtime.     Cholecalciferol (VITAMIN D-3) 125 MCG (5000 UT) TABS Take 5,000 Units by mouth daily.     COSENTYX 300 DOSE 150 MG/ML SOSY Inject 300 mg into the muscle every 28 (twenty-eight) days.     diclofenac  (VOLTAREN ) 75 MG EC tablet Take 1 tablet (75 mg total) by mouth 2 (two) times daily as needed (for neck pain and headaches). 30 tablet 3   ergocalciferol (VITAMIN D2) 1.25 MG (50000 UT) capsule Take 50,000 Units by mouth See admin instructions. Take 50,000 units by mouth once a week as needed for vitamin D deficiency     ferrous sulfate  325 (65 FE) MG tablet Take 325 mg by mouth daily with breakfast.     folic acid  (FOLVITE ) 400 MCG tablet Take 800 mcg by mouth daily.     furosemide (LASIX) 40 MG tablet Take 40 mg by mouth daily as needed for fluid or edema.  gabapentin  (NEURONTIN ) 100 MG capsule Take 100 mg by mouth See admin instructions. Take 100 mg by mouth two times a day and an additional 100 mg once a day as needed for pain     glucosamine-chondroitin 500-400 MG tablet Take 1 tablet by mouth 2 (two) times daily.     hydrOXYzine  (ATARAX /VISTARIL ) 10 MG tablet Take 10 mg by mouth at bedtime as needed for itching. Reported on 10/27/2015     levothyroxine  (SYNTHROID , LEVOTHROID) 75 MCG tablet Take 75 mcg by mouth daily before breakfast.     methotrexate  (RHEUMATREX) 2.5 MG tablet Take 5 mg by mouth See admin instructions. Take 5 mg by mouth on Friday(s) and Saturday(s). Caution:Chemotherapy.  Protect from light.     metoprolol  succinate (TOPROL -XL) 50 MG 24 hr tablet Take 25 mg by mouth daily. Take with or immediately following a meal.     NASONEX 24HR 50 MCG/ACT nasal spray Place 2 sprays into the nose daily as needed (for rhinitis).     nitroGLYCERIN  (NITROSTAT ) 0.4 MG SL tablet Place 1 tablet (0.4 mg total) under the tongue every 5 (five) minutes as needed for chest pain. 25 tablet 3   pantoprazole  (PROTONIX ) 40 MG tablet Take 40 mg by mouth daily before breakfast.     potassium chloride  (K-DUR) 10 MEQ tablet Take 10 mEq by mouth daily as needed (for cramping).     predniSONE  (DELTASONE ) 20 MG tablet Take 20 mg by mouth daily as needed (for psoriasis flares- AS DIRECTED).     rizatriptan (MAXALT-MLT) 10 MG disintegrating tablet Take 10 mg by mouth as needed for migraine (dissove orally).  5   SYMBICORT  160-4.5 MCG/ACT inhaler Inhale 2 puffs into the lungs 2 (two) times daily.  5   SYSTANE ULTRA PF 0.4-0.3 % SOLN Place 1 drop into both eyes 3 (three) times daily as needed (for dryness).     topiramate  (TOPAMAX ) 100 MG tablet Take 100 mg by mouth at bedtime.     TYLENOL  8 HOUR ARTHRITIS PAIN 650 MG CR tablet Take 650-1,300 mg by mouth every 8 (eight) hours as needed for pain.     vitamin E 180 MG (400 UNITS) capsule Take 800 Units by mouth in the morning and at bedtime.     No facility-administered medications prior to visit.        Objective:     LMP 06/25/2016          Assessment         "
